# Patient Record
Sex: Female | Born: 1960 | Race: White | Hispanic: No | Marital: Married | Smoking: Never smoker
Health system: Southern US, Community
[De-identification: ages and names within clinical notes are randomized; demographics above are authoritative.]

## PROBLEM LIST (undated history)

## (undated) ENCOUNTER — Emergency Department (HOSPITAL_COMMUNITY): Admission: EM | Payer: 59 | Source: Home / Self Care

## (undated) DIAGNOSIS — K2960 Other gastritis without bleeding: Secondary | ICD-10-CM

## (undated) DIAGNOSIS — Z9889 Other specified postprocedural states: Secondary | ICD-10-CM

## (undated) DIAGNOSIS — D131 Benign neoplasm of stomach: Secondary | ICD-10-CM

## (undated) DIAGNOSIS — F4001 Agoraphobia with panic disorder: Secondary | ICD-10-CM

## (undated) DIAGNOSIS — F329 Major depressive disorder, single episode, unspecified: Secondary | ICD-10-CM

## (undated) DIAGNOSIS — D649 Anemia, unspecified: Secondary | ICD-10-CM

## (undated) DIAGNOSIS — E669 Obesity, unspecified: Secondary | ICD-10-CM

## (undated) DIAGNOSIS — G35 Multiple sclerosis: Secondary | ICD-10-CM

## (undated) DIAGNOSIS — M519 Unspecified thoracic, thoracolumbar and lumbosacral intervertebral disc disorder: Secondary | ICD-10-CM

## (undated) DIAGNOSIS — G4730 Sleep apnea, unspecified: Secondary | ICD-10-CM

## (undated) DIAGNOSIS — I10 Essential (primary) hypertension: Secondary | ICD-10-CM

## (undated) DIAGNOSIS — F32A Depression, unspecified: Secondary | ICD-10-CM

## (undated) DIAGNOSIS — E7800 Pure hypercholesterolemia, unspecified: Secondary | ICD-10-CM

## (undated) DIAGNOSIS — K219 Gastro-esophageal reflux disease without esophagitis: Secondary | ICD-10-CM

## (undated) DIAGNOSIS — K141 Geographic tongue: Secondary | ICD-10-CM

## (undated) HISTORY — PX: COLONOSCOPY: SHX174

## (undated) HISTORY — PX: NISSEN FUNDOPLICATION: SHX2091

---

## 2003-11-04 ENCOUNTER — Ambulatory Visit (HOSPITAL_COMMUNITY): Admission: RE | Admit: 2003-11-04 | Discharge: 2003-11-04 | Payer: Self-pay | Admitting: Family Medicine

## 2005-11-06 ENCOUNTER — Ambulatory Visit: Payer: Self-pay | Admitting: Gastroenterology

## 2005-11-14 ENCOUNTER — Other Ambulatory Visit: Payer: Self-pay

## 2005-11-20 ENCOUNTER — Ambulatory Visit: Payer: Self-pay | Admitting: Gastroenterology

## 2006-03-26 ENCOUNTER — Ambulatory Visit: Payer: Self-pay | Admitting: Family Medicine

## 2007-09-18 ENCOUNTER — Ambulatory Visit: Payer: Self-pay | Admitting: Family Medicine

## 2008-04-15 ENCOUNTER — Ambulatory Visit: Payer: Self-pay | Admitting: Family Medicine

## 2008-09-01 ENCOUNTER — Ambulatory Visit: Payer: Self-pay | Admitting: Gastroenterology

## 2008-09-01 LAB — HM COLONOSCOPY

## 2008-09-07 ENCOUNTER — Ambulatory Visit: Payer: Self-pay | Admitting: Gastroenterology

## 2008-09-30 ENCOUNTER — Emergency Department: Payer: Self-pay | Admitting: Internal Medicine

## 2008-10-01 ENCOUNTER — Ambulatory Visit: Payer: Self-pay | Admitting: Family Medicine

## 2008-11-22 ENCOUNTER — Ambulatory Visit: Payer: Self-pay | Admitting: Family Medicine

## 2010-02-08 ENCOUNTER — Ambulatory Visit: Payer: Self-pay | Admitting: Family Medicine

## 2010-04-20 ENCOUNTER — Ambulatory Visit: Payer: Self-pay | Admitting: Family Medicine

## 2010-12-04 ENCOUNTER — Ambulatory Visit: Payer: Self-pay | Admitting: General Surgery

## 2011-02-21 ENCOUNTER — Ambulatory Visit: Payer: Self-pay

## 2011-04-25 ENCOUNTER — Ambulatory Visit: Payer: Self-pay | Admitting: Family Medicine

## 2011-08-07 ENCOUNTER — Ambulatory Visit: Payer: Self-pay | Admitting: Gastroenterology

## 2011-08-09 LAB — PATHOLOGY REPORT

## 2012-05-20 ENCOUNTER — Ambulatory Visit: Payer: Self-pay | Admitting: Family Medicine

## 2012-07-17 ENCOUNTER — Ambulatory Visit: Payer: Self-pay | Admitting: Family Medicine

## 2012-11-25 ENCOUNTER — Ambulatory Visit: Payer: Self-pay | Admitting: Gastroenterology

## 2012-11-26 LAB — PATHOLOGY REPORT

## 2013-06-04 ENCOUNTER — Ambulatory Visit: Payer: Self-pay | Admitting: Family Medicine

## 2013-07-01 ENCOUNTER — Ambulatory Visit: Payer: Self-pay | Admitting: Family Medicine

## 2013-07-20 ENCOUNTER — Ambulatory Visit: Payer: Self-pay | Admitting: Family Medicine

## 2013-07-31 ENCOUNTER — Ambulatory Visit: Payer: Self-pay | Admitting: Family Medicine

## 2013-08-31 ENCOUNTER — Ambulatory Visit: Payer: Self-pay | Admitting: Family Medicine

## 2015-03-11 ENCOUNTER — Ambulatory Visit: Payer: Self-pay | Admitting: Family Medicine

## 2015-04-02 ENCOUNTER — Ambulatory Visit
Admit: 2015-04-02 | Disposition: A | Discharge status: Home / Self Care | Payer: Self-pay | Attending: Orthopedic Surgery | Admitting: Orthopedic Surgery

## 2015-04-22 ENCOUNTER — Ambulatory Visit
Admit: 2015-04-22 | Disposition: A | Discharge status: Home / Self Care | Payer: Self-pay | Attending: Family Medicine | Admitting: Family Medicine

## 2015-06-09 ENCOUNTER — Other Ambulatory Visit: Payer: Self-pay | Admitting: Family Medicine

## 2015-06-09 DIAGNOSIS — I10 Essential (primary) hypertension: Secondary | ICD-10-CM | POA: Insufficient documentation

## 2015-07-09 ENCOUNTER — Other Ambulatory Visit: Payer: Self-pay | Admitting: Family Medicine

## 2015-07-09 DIAGNOSIS — E039 Hypothyroidism, unspecified: Secondary | ICD-10-CM

## 2015-07-09 DIAGNOSIS — I10 Essential (primary) hypertension: Secondary | ICD-10-CM

## 2015-07-11 DIAGNOSIS — E039 Hypothyroidism, unspecified: Secondary | ICD-10-CM | POA: Insufficient documentation

## 2015-09-28 ENCOUNTER — Ambulatory Visit (INDEPENDENT_AMBULATORY_CARE_PROVIDER_SITE_OTHER): Payer: 59 | Admitting: Physician Assistant

## 2015-09-28 ENCOUNTER — Encounter: Payer: Self-pay | Admitting: Physician Assistant

## 2015-09-28 VITALS — BP 140/82 | HR 75 | Temp 98.1°F | Resp 16 | Wt 305.4 lb

## 2015-09-28 DIAGNOSIS — N3000 Acute cystitis without hematuria: Secondary | ICD-10-CM

## 2015-09-28 DIAGNOSIS — E7800 Pure hypercholesterolemia, unspecified: Secondary | ICD-10-CM

## 2015-09-28 DIAGNOSIS — E559 Vitamin D deficiency, unspecified: Secondary | ICD-10-CM

## 2015-09-28 DIAGNOSIS — R739 Hyperglycemia, unspecified: Secondary | ICD-10-CM | POA: Insufficient documentation

## 2015-09-28 DIAGNOSIS — F410 Panic disorder [episodic paroxysmal anxiety] without agoraphobia: Secondary | ICD-10-CM | POA: Insufficient documentation

## 2015-09-28 DIAGNOSIS — I10 Essential (primary) hypertension: Secondary | ICD-10-CM | POA: Insufficient documentation

## 2015-09-28 DIAGNOSIS — K2270 Barrett's esophagus without dysplasia: Secondary | ICD-10-CM | POA: Diagnosis not present

## 2015-09-28 DIAGNOSIS — M519 Unspecified thoracic, thoracolumbar and lumbosacral intervertebral disc disorder: Secondary | ICD-10-CM | POA: Insufficient documentation

## 2015-09-28 DIAGNOSIS — D509 Iron deficiency anemia, unspecified: Secondary | ICD-10-CM

## 2015-09-28 DIAGNOSIS — R7303 Prediabetes: Secondary | ICD-10-CM

## 2015-09-28 DIAGNOSIS — R232 Flushing: Secondary | ICD-10-CM

## 2015-09-28 DIAGNOSIS — N951 Menopausal and female climacteric states: Secondary | ICD-10-CM

## 2015-09-28 DIAGNOSIS — K219 Gastro-esophageal reflux disease without esophagitis: Secondary | ICD-10-CM | POA: Insufficient documentation

## 2015-09-28 DIAGNOSIS — F4001 Agoraphobia with panic disorder: Secondary | ICD-10-CM | POA: Insufficient documentation

## 2015-09-28 DIAGNOSIS — E669 Obesity, unspecified: Secondary | ICD-10-CM | POA: Insufficient documentation

## 2015-09-28 DIAGNOSIS — K141 Geographic tongue: Secondary | ICD-10-CM | POA: Insufficient documentation

## 2015-09-28 DIAGNOSIS — B379 Candidiasis, unspecified: Secondary | ICD-10-CM | POA: Diagnosis not present

## 2015-09-28 DIAGNOSIS — G4730 Sleep apnea, unspecified: Secondary | ICD-10-CM | POA: Insufficient documentation

## 2015-09-28 DIAGNOSIS — E039 Hypothyroidism, unspecified: Secondary | ICD-10-CM

## 2015-09-28 DIAGNOSIS — T3695XA Adverse effect of unspecified systemic antibiotic, initial encounter: Secondary | ICD-10-CM

## 2015-09-28 DIAGNOSIS — M5126 Other intervertebral disc displacement, lumbar region: Secondary | ICD-10-CM | POA: Insufficient documentation

## 2015-09-28 DIAGNOSIS — F329 Major depressive disorder, single episode, unspecified: Secondary | ICD-10-CM | POA: Insufficient documentation

## 2015-09-28 DIAGNOSIS — F32A Depression, unspecified: Secondary | ICD-10-CM | POA: Insufficient documentation

## 2015-09-28 DIAGNOSIS — R7309 Other abnormal glucose: Secondary | ICD-10-CM

## 2015-09-28 DIAGNOSIS — G4700 Insomnia, unspecified: Secondary | ICD-10-CM | POA: Insufficient documentation

## 2015-09-28 DIAGNOSIS — G2581 Restless legs syndrome: Secondary | ICD-10-CM | POA: Insufficient documentation

## 2015-09-28 DIAGNOSIS — R350 Frequency of micturition: Secondary | ICD-10-CM | POA: Diagnosis not present

## 2015-09-28 DIAGNOSIS — G4733 Obstructive sleep apnea (adult) (pediatric): Secondary | ICD-10-CM | POA: Insufficient documentation

## 2015-09-28 LAB — POCT URINALYSIS DIPSTICK
Bilirubin, UA: NEGATIVE
Blood, UA: NEGATIVE
Glucose, UA: NEGATIVE
Ketones, UA: NEGATIVE
Nitrite, UA: POSITIVE
Spec Grav, UA: 1.02
Urobilinogen, UA: 0.2
pH, UA: 6.5

## 2015-09-28 MED ORDER — FLUCONAZOLE 150 MG PO TABS: ORAL_TABLET | ORAL | Status: DC

## 2015-09-28 MED ORDER — NITROFURANTOIN MONOHYD MACRO 100 MG PO CAPS: 100.0000 mg | ORAL_CAPSULE | Freq: Two times a day (BID) | ORAL | Status: DC

## 2015-09-28 NOTE — Patient Instructions (Signed)

## 2015-09-28 NOTE — Progress Notes (Signed)
Patient ID: Patricia Deleon, female   DOB: 11-21-1961, 54 y.o.   MRN: [id number] Name: Patricia Deleon   MRN: [id number]    DOB: 24-Sep-1961   Date:09/28/2015       Progress Note  Subjective  Chief Complaint  Chief Complaint  Patient presents with  . Urinary Frequency    X 1 week  . Over Active Bladder    X 1 year, bladder leakage    Urinary Frequency  This is a new problem. The current episode started in the past 7 days. The problem occurs every urination. The problem has been gradually worsening. Associated symptoms include frequency and urgency. She has tried nothing for the symptoms.    she also states that she has had stress incontinence and urinary leakage for over one year now. She does wear poise pads. She does question if having the constant moisture may have caused her UTI. She is interested in possible treatment for this but would like to treat the UTI first.   She also is requesting labs for some of her chronic illnesses for the results to be discussed at her follow-up appointment. She states that she has not had labs in a long time and is curious about her iron level, vitamin D level, thyroid level and cholesterol. She also has Barrett's esophagus and is requesting a referral to Dr. Gustavo Lah, GI, whom she has seen in the past.   She also is reporting an increase in occurrence of hot flashes. She feels this may be menopause on setting. She does state she still has a regular menstrual cycle. No problem-specific assessment & plan notes found for this encounter.   History reviewed. No pertinent past medical history.  Social History  Substance Use Topics  . Smoking status: Never Smoker   . Smokeless tobacco: Never Used  . Alcohol Use: No     Current outpatient prescriptions:  .  CARTIA XT 180 MG 24 hr capsule, TAKE TWO CAPSULES BY MOUTH ONCE DAILY, Disp: 60 capsule, Rfl: 5 .  Cholecalciferol (VITAMIN D3) 2000 UNITS capsule, Take by mouth., Disp: , Rfl:  .   dexlansoprazole (DEXILANT) 60 MG capsule, Take by mouth., Disp: , Rfl:  .  DULoxetine (CYMBALTA) 30 MG capsule, Take 90 mg by mouth. , Disp: , Rfl:  .  FERROUS FUMARATE-VITAMIN C PO, Take by mouth., Disp: , Rfl:  .  Glucosamine Sulfate-MSM (MSM/GLUCOSAMINE) 250-250 MG CAPS, Take by mouth., Disp: , Rfl:  .  hydrochlorothiazide (MICROZIDE) 12.5 MG capsule, TAKE ONE CAPSULE BY MOUTH ONCE DAILY, Disp: 30 capsule, Rfl: 5 .  lansoprazole (PREVACID) 30 MG capsule, Take by mouth., Disp: , Rfl:  .  SYNTHROID 175 MCG tablet, TAKE ONE TABLET BY MOUTH ONCE DAILY, Disp: 30 tablet, Rfl: 5 .  temazepam (RESTORIL) 30 MG capsule, Take by mouth., Disp: , Rfl:   No Known Allergies  Review of Systems  Constitutional: Positive for diaphoresis.  HENT: Negative.   Eyes: Negative.   Respiratory: Negative.   Cardiovascular: Negative.   Gastrointestinal: Negative.   Genitourinary: Positive for urgency and frequency.  Musculoskeletal: Negative.   Skin: Negative.   Endo/Heme/Allergies: Negative.   Psychiatric/Behavioral: Negative.    Objective  Filed Vitals:   09/28/15 1319  BP: 140/82  Pulse: 75  Temp: 98.1 F (36.7 C)  TempSrc: Oral  Resp: 16  Weight: 305 lb 6.4 oz (138.529 kg)  SpO2: 97%     Physical Exam  Constitutional: She is well-developed, well-nourished, and in no distress. No distress.  Cardiovascular: Normal rate, regular rhythm and normal heart sounds.  Exam reveals no gallop and no friction rub.   No murmur heard. Pulmonary/Chest: Effort normal and breath sounds normal. No respiratory distress. She has no wheezes. She has no rales.  Abdominal: Soft. Bowel sounds are normal. She exhibits no distension and no mass. There is tenderness in the suprapubic area. There is no rebound, no guarding and no CVA tenderness.  Skin: She is not diaphoretic.  Vitals reviewed.  No results found for this or any previous visit (from the past 2160 hour(s)).   Assessment & Plan  1. Acute cystitis  without hematuria  I will send her urine for culture. I will treat the UTI with Macrobid as below. Will change antibiotic if needed per C&S results. I will see her back in approximately 2 weeks to discuss further lab work and her overactive bladder. - nitrofurantoin, macrocrystal-monohydrate, (MACROBID) 100 MG capsule; Take 1 capsule (100 mg total) by mouth 2 (two) times daily.  Dispense: 20 capsule; Refill: 0  2. Frequent urination See above medical treatment plan for acute cystitis. - POCT urinalysis dipstick - CBC with Differential - Urine culture  3. Essential hypertension Blood pressure today was decent. I will check her CMP and follow-up in 2 weeks to discuss the results. - Comprehensive Metabolic Panel (CMET)  4. Borderline diabetes She has previously been noted to have an elevated hemoglobin A1c. This has not been checked in a long time and she is curious as to what her level is now. She states that she had gotten better when she was losing weight. She states she lost almost 90 pounds but has since gained the majority of that back and is curious as to what her level is now. I will check her hemoglobin A1c level and follow-up in 2 weeks to discuss the results. - HgB A1c  5. Hypothyroidism, unspecified hypothyroidism type Previously controlled on Synthroid 175 g. I will check her thyroid level to make sure this dose is appropriate. Follow-up in 2 weeks to discuss lab results. - TSH  6. Avitaminosis D She does have a history of this and is currently on a vitamin D supplement. She has not had her levels checked in a long time and is curious if the vitamin D supplement is still needed. I will check her vitamin D level and follow-up in 2 weeks to discuss the lab results. - Vitamin D (25 hydroxy)  7. Anemia, iron deficiency She does have a history of this and has been on an iron supplement for a long time. She is curious as to what her level is because she would like to discontinue the  iron. She states it does cause her GI upset and aggravates her acid reflux. I will check her iron level and CBC to see if she may discontinue the iron supplement. I will see her back in 2 weeks to discuss the lab results. - Iron  8. Hypercholesteremia She did have a history of high cholesterol prior to her weight loss. She states that since she has gained the majority of her weight back that she had loss she would like to have her cholesterol level checked again because she is scared that it might be high. Prior to her weight loss she had been on a cholesterol-lowering medication. I will check her lipid panel and follow-up in 2 weeks to discuss the results. - Lipid panel  9. Barrett's esophagus She does have a known history of this and was previously  seen by Dr. Gustavo Lah. She states that she has not had an EGD in a while and would like a new referral back to his office for consideration of a repeat EGD to evaluate the Barrett's esophagus. - Ambulatory referral to Gastroenterology  10. Hot flashes This is a new onset over the last couple of months. She states that she feels it may be secondary due to and a pause. I will check her labs to see if she is starting to become in a perimenopausal state. She does still continue her menstrual cycle and states it is regular. I will follow-up with her in 2 weeks to discuss these results. - FSH/LH  11. Antibiotic-induced yeast infection She was started on Macrobid for UTI. She states that she frequently gets yeast infections when she is put on an antibiotic. I will go ahead and send and Diflucan as below for antibiotic-induced she states infection. - fluconazole (DIFLUCAN) 150 MG tablet; 1 tab PO once daily, may repeat in 3 days if needed.  Dispense: 2 tablet; Refill: 0

## 2015-09-29 ENCOUNTER — Telehealth: Payer: Self-pay

## 2015-09-29 LAB — CBC WITH DIFFERENTIAL/PLATELET
Basophils Absolute: 0 10*3/uL (ref 0.0–0.2)
Basos: 0 %
EOS (ABSOLUTE): 0.1 10*3/uL (ref 0.0–0.4)
Eos: 1 %
Hematocrit: 45.3 % (ref 34.0–46.6)
Hemoglobin: 15.5 g/dL (ref 11.1–15.9)
Immature Grans (Abs): 0 10*3/uL (ref 0.0–0.1)
Immature Granulocytes: 0 %
Lymphocytes Absolute: 2.3 10*3/uL (ref 0.7–3.1)
Lymphs: 32 %
MCH: 31.1 pg (ref 26.6–33.0)
MCHC: 34.2 g/dL (ref 31.5–35.7)
MCV: 91 fL (ref 79–97)
Monocytes Absolute: 0.3 10*3/uL (ref 0.1–0.9)
Monocytes: 5 %
Neutrophils Absolute: 4.6 10*3/uL (ref 1.4–7.0)
Neutrophils: 62 %
Platelets: 271 10*3/uL (ref 150–379)
RBC: 4.98 x10E6/uL (ref 3.77–5.28)
RDW: 13.7 % (ref 12.3–15.4)
WBC: 7.4 10*3/uL (ref 3.4–10.8)

## 2015-09-29 LAB — COMPREHENSIVE METABOLIC PANEL
ALT: 17 IU/L (ref 0–32)
AST: 19 IU/L (ref 0–40)
Albumin/Globulin Ratio: 1.7 (ref 1.1–2.5)
Albumin: 4.6 g/dL (ref 3.5–5.5)
Alkaline Phosphatase: 89 IU/L (ref 39–117)
BUN/Creatinine Ratio: 12 (ref 9–23)
BUN: 10 mg/dL (ref 6–24)
Bilirubin Total: 0.5 mg/dL (ref 0.0–1.2)
CO2: 23 mmol/L (ref 18–29)
Calcium: 9.4 mg/dL (ref 8.7–10.2)
Chloride: 97 mmol/L (ref 97–108)
Creatinine, Ser: 0.85 mg/dL (ref 0.57–1.00)
GFR calc Af Amer: 91 mL/min/{1.73_m2} (ref >59)
GFR calc non Af Amer: 79 mL/min/{1.73_m2} (ref >59)
Globulin, Total: 2.7 g/dL (ref 1.5–4.5)
Glucose: 91 mg/dL (ref 65–99)
Potassium: 4.1 mmol/L (ref 3.5–5.2)
Sodium: 136 mmol/L (ref 134–144)
Total Protein: 7.3 g/dL (ref 6.0–8.5)

## 2015-09-29 LAB — LIPID PANEL
Chol/HDL Ratio: 5.5 ratio units — ABNORMAL HIGH (ref 0.0–4.4)
Cholesterol, Total: 226 mg/dL — ABNORMAL HIGH (ref 100–199)
HDL: 41 mg/dL (ref >39)
LDL Calculated: 143 mg/dL — ABNORMAL HIGH (ref 0–99)
Triglycerides: 209 mg/dL — ABNORMAL HIGH (ref 0–149)
VLDL Cholesterol Cal: 42 mg/dL — ABNORMAL HIGH (ref 5–40)

## 2015-09-29 LAB — FSH/LH
FSH: 3 m[IU]/mL
LH: 5.9 m[IU]/mL

## 2015-09-29 LAB — HEMOGLOBIN A1C
Est. average glucose Bld gHb Est-mCnc: 120 mg/dL
Hgb A1c MFr Bld: 5.8 % — ABNORMAL HIGH (ref 4.8–5.6)

## 2015-09-29 LAB — VITAMIN D 25 HYDROXY (VIT D DEFICIENCY, FRACTURES): Vit D, 25-Hydroxy: 30.1 ng/mL (ref 30.0–100.0)

## 2015-09-29 LAB — TSH: TSH: 0.285 u[IU]/mL — ABNORMAL LOW (ref 0.450–4.500)

## 2015-09-29 LAB — IRON: Iron: 92 ug/dL (ref 27–159)

## 2015-09-29 NOTE — Telephone Encounter (Signed)
LMTCB  Thanks,  -Joseline 

## 2015-09-29 NOTE — Telephone Encounter (Signed)
-----   Message from Mar Daring, [place] sent at 09/29/2015  2:14 PM EDT ----- Blood count is WNL, no anemia currently.  TSH is slightly low so may need to decrease synthroid dose slightly and recheck.  We will discuss this when you return in 2 weeks.  Iron is stable.  May discontinue iron supplement.  We will recheck when we check TSH to make sure it does not drop again.  Vit D level is stable and WNL.  Kidney and liver function as well as other electrolytes are all WNL.  HgB A1c is slightly elevated still at 5.8 indicating pre-diabetic.  Cholesterol and LDL are slightly elevated as well.  When you return in 2 weeks we will discuss if we should restart cholesterol lowering medication at this time or focus on lifestyle changes and add fish oil.  FSH and LH do not indicate peri-menopausal or post menopausal yet.  We will discuss in more detail in 2 weeks when you return.  Urin culture has not resulted yet.

## 2015-09-30 ENCOUNTER — Telehealth: Payer: Self-pay

## 2015-09-30 LAB — URINE CULTURE

## 2015-09-30 NOTE — Telephone Encounter (Signed)
-----   Message from Mar Daring, PA-C sent at 09/30/2015 11:44 AM EDT ----- Culture was positive for e.coli.  It is susceptible to antibiotic prescribed.  Continue until completed.

## 2015-09-30 NOTE — Telephone Encounter (Signed)
Patient advised as directed below.  Thanks,  -Joseline 

## 2015-09-30 NOTE — Telephone Encounter (Signed)
LM regarding the results. Per patient as we spoke earlier it was ok to leave results on voicemail. If any questions or concerns advised to return call.  Thanks,  -Alexei Ey

## 2015-10-12 ENCOUNTER — Ambulatory Visit (INDEPENDENT_AMBULATORY_CARE_PROVIDER_SITE_OTHER): Payer: 59 | Admitting: Physician Assistant

## 2015-10-12 ENCOUNTER — Encounter: Payer: Self-pay | Admitting: Physician Assistant

## 2015-10-12 VITALS — BP 138/78 | HR 92 | Temp 98.9°F | Resp 16 | Ht 67.0 in | Wt 307.2 lb

## 2015-10-12 DIAGNOSIS — Z23 Encounter for immunization: Secondary | ICD-10-CM | POA: Diagnosis not present

## 2015-10-12 DIAGNOSIS — E039 Hypothyroidism, unspecified: Secondary | ICD-10-CM

## 2015-10-12 DIAGNOSIS — I10 Essential (primary) hypertension: Secondary | ICD-10-CM | POA: Diagnosis not present

## 2015-10-12 DIAGNOSIS — Z6841 Body Mass Index (BMI) 40.0 and over, adult: Secondary | ICD-10-CM | POA: Diagnosis not present

## 2015-10-12 DIAGNOSIS — R7303 Prediabetes: Secondary | ICD-10-CM

## 2015-10-12 MED ORDER — PHENTERMINE HCL 15 MG PO CAPS: 15.0000 mg | ORAL_CAPSULE | ORAL | Status: DC

## 2015-10-12 MED ORDER — LEVOTHYROXINE SODIUM 150 MCG PO TABS: 150.0000 ug | ORAL_TABLET | Freq: Every day | ORAL | Status: DC

## 2015-10-12 NOTE — Patient Instructions (Addendum)
Calorie Counting for Weight Loss Calories are energy you get from the things you eat and drink. Your body uses this energy to keep you going throughout the day. The number of calories you eat affects your weight. When you eat more calories than your body needs, your body stores the extra calories as fat. When you eat fewer calories than your body needs, your body burns fat to get the energy it needs. Calorie counting means keeping track of how many calories you eat and drink each day. If you make sure to eat fewer calories than your body needs, you should lose weight. In order for calorie counting to work, you will need to eat the number of calories that are right for you in a day to lose a healthy amount of weight per week. A healthy amount of weight to lose per week is usually 1-2 lb (0.5-0.9 kg). A dietitian can determine how many calories you need in a day and give you suggestions on how to reach your calorie goal.  WHAT IS MY MY PLAN? My goal is to have 1200-1500 calories per day.  If I have this many calories per day, I should lose around 1-2 pounds per week. WHAT DO I NEED TO KNOW ABOUT CALORIE COUNTING? In order to meet your daily calorie goal, you will need to:  Find out how many calories are in each food you would like to eat. Try to do this before you eat.  Decide how much of the food you can eat.  Write down what you ate and how many calories it had. Doing this is called keeping a food log. WHERE DO I FIND CALORIE INFORMATION? The number of calories in a food can be found on a Nutrition Facts label. Note that all the information on a label is based on a specific serving of the food. If a food does not have a Nutrition Facts label, try to look up the calories online or ask your dietitian for help. HOW DO I DECIDE HOW MUCH TO EAT? To decide how much of the food you can eat, you will need to consider both the number of calories in one serving and the size of one serving. This information  can be found on the Nutrition Facts label. If a food does not have a Nutrition Facts label, look up the information online or ask your dietitian for help. Remember that calories are listed per serving. If you choose to have more than one serving of a food, you will have to multiply the calories per serving by the amount of servings you plan to eat. For example, the label on a package of bread might say that a serving size is 1 slice and that there are 90 calories in a serving. If you eat 1 slice, you will have eaten 90 calories. If you eat 2 slices, you will have eaten 180 calories. HOW DO I KEEP A FOOD LOG? After each meal, record the following information in your food log:  What you ate.  How much of it you ate.  How many calories it had.  Then, add up your calories. Keep your food log near you, such as in a small notebook in your pocket. Another option is to use a mobile app or website. Some programs will calculate calories for you and show you how many calories you have left each time you add an item to the log. WHAT ARE SOME CALORIE COUNTING TIPS?  Use your calories on foods  and drinks that will fill you up and not leave you hungry. Some examples of this include foods like nuts and nut butters, vegetables, lean proteins, and high-fiber foods (more than 5 g fiber per serving).  Eat nutritious foods and avoid empty calories. Empty calories are calories you get from foods or beverages that do not have many nutrients, such as candy and soda. It is better to have a nutritious high-calorie food (such as an avocado) than a food with few nutrients (such as a bag of chips).  Know how many calories are in the foods you eat most often. This way, you do not have to look up how many calories they have each time you eat them.  Look out for foods that may seem like low-calorie foods but are really high-calorie foods, such as baked goods, soda, and fat-free candy.  Pay attention to calories in drinks.  Drinks such as sodas, specialty coffee drinks, alcohol, and juices have a lot of calories yet do not fill you up. Choose low-calorie drinks like water and diet drinks.  Focus your calorie counting efforts on higher calorie items. Logging the calories in a garden salad that contains only vegetables is less important than calculating the calories in a milk shake.  Find a way of tracking calories that works for you. Get creative. Most people who are successful find ways to keep track of how much they eat in a day, even if they do not count every calorie. WHAT ARE SOME PORTION CONTROL TIPS?  Know how many calories are in a serving. This will help you know how many servings of a certain food you can have.  Use a measuring cup to measure serving sizes. This is helpful when you start out. With time, you will be able to estimate serving sizes for some foods.  Take some time to put servings of different foods on your favorite plates, bowls, and cups so you know what a serving looks like.  Try not to eat straight from a bag or box. Doing this can lead to overeating. Put the amount you would like to eat in a cup or on a plate to make sure you are eating the right portion.  Use smaller plates, glasses, and bowls to prevent overeating. This is a quick and easy way to practice portion control. If your plate is smaller, less food can fit on it.  Try not to multitask while eating, such as watching TV or using your computer. If it is time to eat, sit down at a table and enjoy your food. Doing this will help you to start recognizing when you are full. It will also make you more aware of what and how much you are eating. HOW CAN I CALORIE COUNT WHEN EATING OUT?  Ask for smaller portion sizes or child-sized portions.  Consider sharing an entree and sides instead of getting your own entree.  If you get your own entree, eat only half. Ask for a box at the beginning of your meal and put the rest of your entree in  it so you are not tempted to eat it.  Look for the calories on the menu. If calories are listed, choose the lower calorie options.  Choose dishes that include vegetables, fruits, whole grains, low-fat dairy products, and lean protein. Focusing on smart food choices from each of the 5 food groups can help you stay on track at restaurants.  Choose items that are boiled, broiled, grilled, or steamed.  Choose  water, milk, unsweetened iced tea, or other drinks without added sugars. If you want an alcoholic beverage, choose a lower calorie option. For example, a regular margarita can have up to 700 calories and a glass of wine has around 150.  Stay away from items that are buttered, battered, fried, or served with cream sauce. Items labeled "crispy" are usually fried, unless stated otherwise.  Ask for dressings, sauces, and syrups on the side. These are usually very high in calories, so do not eat much of them.  Watch out for salads. Many people think salads are a healthy option, but this is often not the case. Many salads come with bacon, fried chicken, lots of cheese, fried chips, and dressing. All of these items have a lot of calories. If you want a salad, choose a garden salad and ask for grilled meats or steak. Ask for the dressing on the side, or ask for olive oil and vinegar or lemon to use as dressing.  Estimate how many servings of a food you are given. For example, a serving of cooked rice is  cup or about the size of half a tennis ball or one cupcake wrapper. Knowing serving sizes will help you be aware of how much food you are eating at restaurants. The list below tells you how big or small some common portion sizes are based on everyday objects.  1 oz--4 stacked dice.  3 oz--1 deck of cards.  1 tsp--1 dice.  1 Tbsp-- a Ping-Pong ball.  2 Tbsp--1 Ping-Pong ball.   cup--1 tennis ball or 1 cupcake wrapper.  1 cup--1 baseball.   This information is not intended to replace advice  given to you by your health care provider. Make sure you discuss any questions you have with your health care provider.   Document Released: 12/17/2005 Document Revised: 01/07/2015 Document Reviewed: 10/22/2013 Elsevier Interactive Patient Education 2016 [employer]. Exercising to [employer] Exercising can help you to lose weight. In order to lose weight through exercise, you need to do vigorous-intensity exercise. You can tell that you are exercising with vigorous intensity if you are breathing very hard and fast and cannot hold a conversation while exercising. Moderate-intensity exercise helps to maintain your current weight. You can tell that you are exercising at a moderate level if you have a higher heart rate and faster breathing, but you are still able to hold a conversation. HOW OFTEN SHOULD I EXERCISE? Choose an activity that you enjoy and set realistic goals. Your health care provider can help you to make an activity plan that works for you. Exercise regularly as directed by your health care provider. This may include:  Doing resistance training twice each week, such as:  Push-ups.  Sit-ups.  Lifting weights.  Using resistance bands.  Doing a given intensity of exercise for a given amount of time. Choose from these options:  150 minutes of moderate-intensity exercise every week.  75 minutes of vigorous-intensity exercise every week.  A mix of moderate-intensity and vigorous-intensity exercise every week. Children, pregnant women, people who are out of shape, people who are overweight, and older adults may need to consult a health care provider for individual recommendations. If you have any sort of medical condition, be sure to consult your health care provider before starting a new exercise program. WHAT ARE SOME ACTIVITIES THAT CAN HELP ME TO LOSE WEIGHT?   Walking at a rate of at least 4.5 miles an hour.  Jogging or running at a rate of  5 miles per hour.  Biking at a  rate of at least 10 miles per hour.  Lap swimming.  Roller-skating or in-line skating.  Cross-country skiing.  Vigorous competitive sports, such as football, basketball, and soccer.  Jumping rope.  Aerobic dancing. HOW CAN I BE MORE ACTIVE IN MY DAY-TO-DAY ACTIVITIES?  Use the stairs instead of the elevator.  Take a walk during your lunch break.  If you drive, park your car farther away from work or school.  If you take public transportation, get off one stop early and walk the rest of the way.  Make all of your phone calls while standing up and walking around.  Get up, stretch, and walk around every 30 minutes throughout the day. WHAT GUIDELINES SHOULD I FOLLOW WHILE EXERCISING?  Do not exercise so much that you hurt yourself, feel dizzy, or get very short of breath.  Consult your health care provider prior to starting a new exercise program.  Wear comfortable clothes and shoes with good support.  Drink plenty of water while you exercise to prevent dehydration or heat stroke. Body water is lost during exercise and must be replaced.  Work out until you breathe faster and your heart beats faster.   This information is not intended to replace advice given to you by your health care provider. Make sure you discuss any questions you have with your health care provider.   Document Released: 01/19/2011 Document Revised: 01/07/2015 Document Reviewed: 05/20/2014 Elsevier Interactive Patient Education [employer].

## 2015-10-12 NOTE — Progress Notes (Signed)
Patient: Patricia Deleon Female    DOB: December 30, 1961   54 y.o.   MRN: [id number] Visit Date: 10/12/2015  Today's Provider: Mar Daring, PA-C   Chief Complaint  Patient presents with  . Follow-up    Acute Cystitis, Hypertension,Diabetes,Labs   Subjective:    HPI Patricia Deleon 54 year old is here following up on  Labs, acute cystitis, Essential Hypertension, Last OV blood pressure was 140/82.  Today her BP is 138/78. She has borderline diabetes as well with Hgb A1C 5.8 on 09/30/15. Patient wants to talk to provider about weight and adding Metformin for sugar and weight control.  Also she states that the symptoms of the UTI she previously had have completely cleared.     No Known Allergies Previous Medications   CARTIA XT 180 MG 24 HR CAPSULE    TAKE TWO CAPSULES BY MOUTH ONCE DAILY   CHOLECALCIFEROL (VITAMIN D3) 2000 UNITS CAPSULE    Take by mouth.   DEXLANSOPRAZOLE (DEXILANT) 60 MG CAPSULE    Take by mouth.   DULOXETINE (CYMBALTA) 30 MG CAPSULE    Take 90 mg by mouth.    FERROUS FUMARATE-VITAMIN C PO    Take by mouth.   FLUCONAZOLE (DIFLUCAN) 150 MG TABLET    1 tab PO once daily, may repeat in 3 days if needed.   GLUCOSAMINE SULFATE-MSM (MSM/GLUCOSAMINE) 250-250 MG CAPS    Take by mouth.   HYDROCHLOROTHIAZIDE (MICROZIDE) 12.5 MG CAPSULE    TAKE ONE CAPSULE BY MOUTH ONCE DAILY   LANSOPRAZOLE (PREVACID) 30 MG CAPSULE    Take by mouth.   MEGARED OMEGA-3 KRILL OIL PO    Take by mouth daily.   NITROFURANTOIN, MACROCRYSTAL-MONOHYDRATE, (MACROBID) 100 MG CAPSULE    Take 1 capsule (100 mg total) by mouth 2 (two) times daily.   SYNTHROID 175 MCG TABLET    TAKE ONE TABLET BY MOUTH ONCE DAILY   TEMAZEPAM (RESTORIL) 30 MG CAPSULE    Take by mouth.    Review of Systems  Constitutional: Positive for fatigue and unexpected weight change (weight gain).  HENT: Negative for congestion, postnasal drip, rhinorrhea, sinus pressure, sneezing and sore throat.   Respiratory:  Negative for chest tightness and wheezing.   Cardiovascular: Negative for chest pain and palpitations.  Gastrointestinal: Negative.   Endocrine: Negative.   Genitourinary: Positive for urgency and frequency.  Musculoskeletal: Positive for arthralgias (right hip).  Neurological: Negative.     Social History  Substance Use Topics  . Smoking status: Never Smoker   . Smokeless tobacco: Never Used  . Alcohol Use: No   Objective:   BP 138/78 mmHg  Pulse 92  Temp(Src) 98.9 F (37.2 C) (Oral)  Resp 16  Ht 5\' 7"  (1.702 m)  Wt 307 lb 3.2 oz (139.345 kg)  BMI 48.10 kg/m2  LMP 10/07/2015  Physical Exam  Constitutional: She appears well-developed and well-nourished. No distress.  Neck: Normal range of motion. Neck supple. No JVD present. No tracheal deviation present. No thyromegaly present.  Cardiovascular: Normal rate, regular rhythm and normal heart sounds.  Exam reveals no gallop and no friction rub.   No murmur heard. Pulmonary/Chest: Effort normal and breath sounds normal. No respiratory distress. She has no wheezes. She has no rales.  Lymphadenopathy:    She has no cervical adenopathy.  Skin: She is not diaphoretic.  Vitals reviewed.       Assessment & Plan:     1. Need for influenza vaccination Flu  vaccine given today without complication. - Flu Vaccine QUAD 36+ mos IM  2. Hypothyroidism, unspecified hypothyroidism type Labs on 09/30/2015 showed a low TSH at 0.23. I will decrease her levothyroxine dose from 175 g to 150 g as below. She voiced understanding with the change in medication. We'll follow-up in 3 months to recheck TSH. - levothyroxine (SYNTHROID) 150 MCG tablet; Take 1 tablet (150 mcg total) by mouth daily before breakfast.  Dispense: 30 tablet; Refill: 1  3. Morbid obesity due to excess calories [facility]) She states that she has previously lost weight but when her husband lost his job they quit eating healthy and she started gaining more weight. She states at  that time she also quit doing her physical activity. She used to do exercise tapes at home. She states that there was a time where she was doing an hour of cardio 5 days a week. She is going to try to start this routine back up as well as her healthy eating now that she is more stable and has a job herself. She would like to try phentermine to help with appetite suppression during this time. I will follow-up with her in 4 weeks to evaluate how she is doing on her weight loss progress. - phentermine 15 MG capsule; Take 1 capsule (15 mg total) by mouth every morning.  Dispense: 30 capsule; Refill: 0  4. BMI 45.0-49.9, adult [facility]) See above medical treatment plan for morbid obesity. Phentermine was prescribed as below for appetite suppression. I will follow-up with her in 4 weeks to see how she is doing with her weight loss progress. We also discussed possibly increasing the phentermine to 37.5 mg if she tolerates the 15 mg well. - phentermine 15 MG capsule; Take 1 capsule (15 mg total) by mouth every morning.  Dispense: 30 capsule; Refill: 0  5. Borderline diabetes Most recent hemoglobin A1c was 5.8. We discussed weight loss, diet, exercise and will add appetite suppressant as above in hopes that this will help with her blood sugar. We will recheck her hemoglobin A1c in 6 months to see how lifestyle changes are helping this level.  6. Essential hypertension Currently blood pressure is stable. She is currently taking Cartia extended release 180 mg and also taking hydrochlorothiazide 12.5 mg. I will allow her to start her exercise and weight loss program as she is planning to do and we will recheck her blood pressure in 4 weeks. If blood pressure is still elevated I will increase her hydrochlorothiazide to 25 mg.       Mar Daring, PA-C  [facility]

## 2015-11-11 ENCOUNTER — Ambulatory Visit: Payer: 59 | Admitting: Family Medicine

## 2015-11-13 ENCOUNTER — Other Ambulatory Visit: Payer: Self-pay | Admitting: Family Medicine

## 2015-11-13 DIAGNOSIS — K219 Gastro-esophageal reflux disease without esophagitis: Secondary | ICD-10-CM

## 2015-11-14 NOTE — Telephone Encounter (Signed)
Last ov 10/12/15

## 2015-12-14 ENCOUNTER — Other Ambulatory Visit: Payer: Self-pay | Admitting: Physician Assistant

## 2015-12-14 ENCOUNTER — Other Ambulatory Visit: Payer: Self-pay | Admitting: Family Medicine

## 2015-12-14 DIAGNOSIS — K219 Gastro-esophageal reflux disease without esophagitis: Secondary | ICD-10-CM

## 2015-12-14 DIAGNOSIS — I10 Essential (primary) hypertension: Secondary | ICD-10-CM

## 2016-01-30 ENCOUNTER — Encounter: Payer: Self-pay | Admitting: *Deleted

## 2016-01-31 ENCOUNTER — Ambulatory Visit: Payer: 59 | Admitting: Anesthesiology

## 2016-01-31 ENCOUNTER — Encounter
Admission: RE | Disposition: A | Discharge status: Home / Self Care | Payer: Self-pay | Source: Ambulatory Visit | Attending: Gastroenterology

## 2016-01-31 ENCOUNTER — Ambulatory Visit
Admission: RE | Admit: 2016-01-31 | Discharge: 2016-01-31 | Disposition: A | Discharge status: Home / Self Care | Payer: 59 | Source: Ambulatory Visit | Attending: Gastroenterology | Admitting: Gastroenterology

## 2016-01-31 DIAGNOSIS — K317 Polyp of stomach and duodenum: Secondary | ICD-10-CM | POA: Diagnosis not present

## 2016-01-31 DIAGNOSIS — R1013 Epigastric pain: Secondary | ICD-10-CM | POA: Diagnosis present

## 2016-01-31 DIAGNOSIS — K2950 Unspecified chronic gastritis without bleeding: Secondary | ICD-10-CM | POA: Insufficient documentation

## 2016-01-31 DIAGNOSIS — F4001 Agoraphobia with panic disorder: Secondary | ICD-10-CM | POA: Insufficient documentation

## 2016-01-31 DIAGNOSIS — K219 Gastro-esophageal reflux disease without esophagitis: Secondary | ICD-10-CM | POA: Diagnosis not present

## 2016-01-31 DIAGNOSIS — I10 Essential (primary) hypertension: Secondary | ICD-10-CM | POA: Diagnosis not present

## 2016-01-31 DIAGNOSIS — K2270 Barrett's esophagus without dysplasia: Secondary | ICD-10-CM | POA: Diagnosis not present

## 2016-01-31 DIAGNOSIS — E039 Hypothyroidism, unspecified: Secondary | ICD-10-CM | POA: Insufficient documentation

## 2016-01-31 DIAGNOSIS — E7800 Pure hypercholesterolemia, unspecified: Secondary | ICD-10-CM | POA: Insufficient documentation

## 2016-01-31 DIAGNOSIS — K449 Diaphragmatic hernia without obstruction or gangrene: Secondary | ICD-10-CM | POA: Insufficient documentation

## 2016-01-31 DIAGNOSIS — F329 Major depressive disorder, single episode, unspecified: Secondary | ICD-10-CM | POA: Insufficient documentation

## 2016-01-31 DIAGNOSIS — Z79899 Other long term (current) drug therapy: Secondary | ICD-10-CM | POA: Diagnosis not present

## 2016-01-31 DIAGNOSIS — Z6841 Body Mass Index (BMI) 40.0 and over, adult: Secondary | ICD-10-CM | POA: Insufficient documentation

## 2016-01-31 DIAGNOSIS — G4730 Sleep apnea, unspecified: Secondary | ICD-10-CM | POA: Insufficient documentation

## 2016-01-31 HISTORY — DX: Unspecified thoracic, thoracolumbar and lumbosacral intervertebral disc disorder: M51.9

## 2016-01-31 HISTORY — PX: ESOPHAGOGASTRODUODENOSCOPY (EGD) WITH PROPOFOL: SHX5813

## 2016-01-31 HISTORY — DX: Depression, unspecified: F32.A

## 2016-01-31 HISTORY — DX: Sleep apnea, unspecified: G47.30

## 2016-01-31 HISTORY — DX: Gastro-esophageal reflux disease without esophagitis: K21.9

## 2016-01-31 HISTORY — DX: Anemia, unspecified: D64.9

## 2016-01-31 HISTORY — DX: Obesity, unspecified: E66.9

## 2016-01-31 HISTORY — DX: Essential (primary) hypertension: I10

## 2016-01-31 HISTORY — DX: Agoraphobia with panic disorder: F40.01

## 2016-01-31 HISTORY — DX: Pure hypercholesterolemia, unspecified: E78.00

## 2016-01-31 HISTORY — DX: Other specified postprocedural states: Z98.890

## 2016-01-31 HISTORY — DX: Major depressive disorder, single episode, unspecified: F32.9

## 2016-01-31 HISTORY — DX: Geographic tongue: K14.1

## 2016-01-31 LAB — POCT PREGNANCY, URINE: PREG TEST UR: NEGATIVE

## 2016-01-31 LAB — SURGICAL PATHOLOGY

## 2016-01-31 SURGERY — ESOPHAGOGASTRODUODENOSCOPY (EGD) WITH PROPOFOL
Anesthesia: General

## 2016-01-31 MED ORDER — PROPOFOL 500 MG/50ML IV EMUL
INTRAVENOUS | Status: DC | PRN
  Administered 2016-01-31: 100 ug/kg/min via INTRAVENOUS

## 2016-01-31 MED ORDER — MIDAZOLAM HCL 2 MG/2ML IJ SOLN
INTRAMUSCULAR | Status: DC | PRN
Start: 2016-01-31 — End: 2016-01-31
  Administered 2016-01-31: 2 mg via INTRAVENOUS

## 2016-01-31 MED ORDER — SODIUM CHLORIDE 0.9 % IV SOLN
INTRAVENOUS | Status: DC
  Administered 2016-01-31: 1000 mL via INTRAVENOUS

## 2016-01-31 MED ORDER — SODIUM CHLORIDE 0.9 % IV SOLN: INTRAVENOUS | Status: DC

## 2016-01-31 MED ORDER — PROPOFOL 10 MG/ML IV BOLUS
INTRAVENOUS | Status: DC | PRN
  Administered 2016-01-31: 10 mg via INTRAVENOUS
  Administered 2016-01-31: 20 mg via INTRAVENOUS

## 2016-01-31 NOTE — Transfer of Care (Signed)
Immediate Anesthesia Transfer of Care Note  Patient: Patricia Deleon  Procedure(s) Performed: Procedure(s): ESOPHAGOGASTRODUODENOSCOPY (EGD) WITH PROPOFOL (N/A)  Patient Location: PACU and Endoscopy Unit  Anesthesia Type:General  Level of Consciousness: awake, alert  and oriented  Airway & Oxygen Therapy: Patient Spontanous Breathing and Patient connected to nasal cannula oxygen  Post-op Assessment: Report given to RN and Post -op Vital signs reviewed and stable  Post vital signs: Reviewed and stable  Last Vitals: 1301- 98.2temp 98% sat 79 hr 22resp 96/53 Filed Vitals:   01/31/16 1037  BP: 148/92  Pulse: 83  Temp: 37.4 C  Resp: 20    Complications: No apparent anesthesia complications

## 2016-01-31 NOTE — Anesthesia Postprocedure Evaluation (Signed)
Anesthesia Post Note  Patient: Patricia Deleon  Procedure(s) Performed: Procedure(s) (LRB): ESOPHAGOGASTRODUODENOSCOPY (EGD) WITH PROPOFOL (N/A)  Patient location during evaluation: PACU Anesthesia Type: General Level of consciousness: awake and alert Pain management: satisfactory to patient Respiratory status: respiratory function stable Cardiovascular status: stable Anesthetic complications: no    Last Vitals:  Filed Vitals:   01/31/16 1037  BP: 148/92  Pulse: 83  Temp: 37.4 C  Resp: 20    Last Pain: There were no vitals filed for this visit.               VAN STAVEREN,Edla Para

## 2016-01-31 NOTE — Anesthesia Preprocedure Evaluation (Signed)
Anesthesia Evaluation  Patient identified by MRN, date of birth, ID band Patient awake    Reviewed: Allergy & Precautions, NPO status , Patient's Chart, lab work & pertinent test results  Airway Mallampati: II       Dental  (+) Teeth Intact   Pulmonary sleep apnea and Continuous Positive Airway Pressure Ventilation ,     + decreased breath sounds      Cardiovascular Exercise Tolerance: Good hypertension, Pt. on medications  Rhythm:Regular     Neuro/Psych    GI/Hepatic Neg liver ROS, GERD  ,  Endo/Other  Hypothyroidism Morbid obesity  Renal/GU negative Renal ROS     Musculoskeletal   Abdominal (+) + obese,   Peds  Hematology  (+) anemia ,   Anesthesia Other Findings   Reproductive/Obstetrics                             Anesthesia Physical Anesthesia Plan  ASA: III  Anesthesia Plan: General   Post-op Pain Management:    Induction: Intravenous  Airway Management Planned: Natural Airway and Nasal Cannula  Additional Equipment:   Intra-op Plan:   Post-operative Plan:   Informed Consent: I have reviewed the patients History and Physical, chart, labs and discussed the procedure including the risks, benefits and alternatives for the proposed anesthesia with the patient or authorized representative who has indicated his/her understanding and acceptance.     Plan Discussed with: CRNA  Anesthesia Plan Comments:         Anesthesia Quick Evaluation

## 2016-01-31 NOTE — Anesthesia Procedure Notes (Signed)
Date/Time: 01/31/2016 12:26 PM Performed by: Johnna Acosta Pre-anesthesia Checklist: Patient identified, Emergency Drugs available, Suction available, Patient being monitored and Timeout performed Patient Re-evaluated:Patient Re-evaluated prior to inductionOxygen Delivery Method: Nasal cannula

## 2016-01-31 NOTE — H&P (Signed)
Outpatient short stay form Pre-procedure 01/31/2016 12:25 PM Lollie Sails MD  Primary Physician: Dr. Margarita Rana  Reason for visit:  EGD  History of present illness:  Patient is a 55 year old female presenting today for EGD in regards to her personal history of Barrett's esophagus as well as some recent dyspepsia. She was placed on some has helped with that symptom. She is also taking DEXALANT in regards her issues with Barrett's and reflux. Has been effective in controlling the reflux itself. She takes no blood thinners or aspirin products.    Current facility-administered medications:  .  0.9 %  sodium chloride infusion, , Intravenous, Continuous, Lollie Sails, MD, Last Rate: 20 mL/hr at 01/31/16 1051, 1,000 mL at 01/31/16 1051 .  0.9 %  sodium chloride infusion, , Intravenous, Continuous, Lollie Sails, MD  Facility-Administered Medications Ordered in Other Encounters:  .  midazolam (VERSED) injection, , Intravenous, Anesthesia Intra-op, Johnna Acosta, CRNA, 2 mg at 01/31/16 1221 .  propofol (DIPRIVAN) 500 MG/50ML infusion, , Intravenous, Continuous PRN, Johnna Acosta, CRNA, Last Rate: 83 mL/hr at 01/31/16 1224, 100 mcg/kg/min at 01/31/16 1224  Prescriptions prior to admission  Medication Sig Dispense Refill Last Dose  . CARTIA XT 180 MG 24 hr capsule TAKE TWO CAPSULES BY MOUTH ONCE DAILY 60 capsule 5 Taking  . Cholecalciferol (VITAMIN D3) 2000 UNITS capsule Take by mouth.   Taking  . DEXILANT 60 MG capsule TAKE ONE CAPSULE BY MOUTH ONCE DAILY 90 capsule 3   . DULoxetine (CYMBALTA) 30 MG capsule Take 90 mg by mouth.    Taking  . FERROUS FUMARATE-VITAMIN C PO Take by mouth.   Taking  . fluconazole (DIFLUCAN) 150 MG tablet 1 tab PO once daily, may repeat in 3 days if needed. 2 tablet 0 Taking  . Glucosamine Sulfate-MSM (MSM/GLUCOSAMINE) 250-250 MG CAPS Take by mouth.   Taking  . hydrochlorothiazide (MICROZIDE) 12.5 MG capsule TAKE ONE CAPSULE BY MOUTH ONCE DAILY  90 capsule 3   . lansoprazole (PREVACID) 30 MG capsule Take by mouth.   Taking  . levothyroxine (SYNTHROID, LEVOTHROID) 150 MCG tablet TAKE ONE TABLET BY MOUTH ONCE DAILY BEFORE BREAKFAST 90 tablet 3   . MEGARED OMEGA-3 KRILL OIL PO Take by mouth daily.   Taking  . nitrofurantoin, macrocrystal-monohydrate, (MACROBID) 100 MG capsule Take 1 capsule (100 mg total) by mouth 2 (two) times daily. 20 capsule 0 Taking  . phentermine 15 MG capsule Take 1 capsule (15 mg total) by mouth every morning. 30 capsule 0   . temazepam (RESTORIL) 30 MG capsule Take by mouth.   Taking     No Known Allergies   Past Medical History  Diagnosis Date  . GERD (gastroesophageal reflux disease)   . Obesity   . Agoraphobia with panic disorder   . Anemia   . Sleep apnea   . Depression   . Intervertebral disc disorder     thoracic,thoracolumber,lumbosacral  . Hypercholesterolemia   . Hypertension   . Geographic tongue   . History of esophagogastroduodenoscopy (EGD)     Review of systems:      Physical Exam    Heart and lungs: Regular rate and rhythm without rub or gallop, lungs are bilaterally clear.    HEENT: Normocephalic atraumatic eyes are anicteric.    Other:     Pertinant exam for procedure: Soft nontender nondistended bowel sounds positive normoactive. Patient is morbidly obese.    Planned proceedures: EGD and indicated procedures. I have discussed the risks benefits and  complications of procedures to include not limited to bleeding, infection, perforation and the risk of sedation and the patient wishes to proceed.    Lollie Sails, MD Gastroenterology 01/31/2016  12:25 PM

## 2016-01-31 NOTE — Op Note (Signed)
[facility] Gastroenterology Patient Name: Patricia Deleon Procedure Date: 01/31/2016 12:20 PM MRN: [id number] Account #: [id number] Date of Birth: [birth year] Admit Type: Outpatient Age: 55 Room: [facility] ENDO ROOM 3 Gender: Female Note Status: Finalized Procedure:         Upper GI endoscopy Indications:       Dyspepsia, Follow-up of Barrett's esophagus Providers:         Lollie Sails, MD Referring MD:      Jerrell Belfast, MD (Referring MD) Medicines:         Monitored Anesthesia Care Complications:     No immediate complications. Procedure:         Pre-Anesthesia Assessment:                    - ASA Grade Assessment: III - A patient with severe                     systemic disease.                    After obtaining informed consent, the endoscope was passed                     under direct vision. Throughout the procedure, the                     patient's blood pressure, pulse, and oxygen saturations                     were monitored continuously. The Endoscope was introduced                     through the mouth, and advanced to the third part of                     duodenum. The upper GI endoscopy was accomplished without                     difficulty. The patient tolerated the procedure well. Findings:      There were esophageal mucosal changes consistent with short-segment       Barrett's esophagus present at the gastroesophageal junction. The       maximum longitudinal extent of these mucosal changes was 1 cm in length.       Mucosa was biopsied with a cold forceps for histology in 4 quadrants.      A small hiatus hernia was present.      Multiple 1 to 5 mm sessile polyps with no bleeding and no stigmata of       recent bleeding were found in the gastric body. Biopsies were taken with       a cold forceps for histology.      Patchy mild inflammation characterized by erosions and erythema was       found in the gastric antrum. Biopsies were taken  with a cold forceps for       histology. Biopsies were taken with a cold forceps for Helicobacter       pylori testing.      The cardia and gastric fundus were normal on retroflexion.      The examined duodenum was normal. Impression:        - Esophageal mucosal changes consistent with short-segment  Barrett's esophagus. Biopsied.                    - Small hiatus hernia.                    - Multiple gastric polyps. Biopsied.                    - Erosive gastritis. Biopsied.                    - Normal examined duodenum. Recommendation:    - Continue present medications.                    - Telephone GI clinic for pathology results in 1 week. Procedure Code(s): --- Professional ---                    [phone], Esophagogastroduodenoscopy, flexible, transoral;                     with biopsy, single or multiple Diagnosis Code(s): --- Professional ---                    K22.70, Barrett's esophagus without dysplasia                    K44.9, Diaphragmatic hernia without obstruction or gangrene                    K31.7, Polyp of stomach and duodenum                    K29.60, Other gastritis without bleeding                    K30, Functional dyspepsia CPT copyright 2014 American Medical Association. All rights reserved. The codes documented in this report are preliminary and upon coder review may  be revised to meet current compliance requirements. Lollie Sails, MD 01/31/2016 12:52:11 PM This report has been signed electronically. Number of Addenda: 0 Note Initiated On: 01/31/2016 12:20 PM      [facility]

## 2016-02-01 ENCOUNTER — Encounter: Payer: Self-pay | Admitting: Gastroenterology

## 2016-02-24 ENCOUNTER — Ambulatory Visit (INDEPENDENT_AMBULATORY_CARE_PROVIDER_SITE_OTHER): Payer: 59 | Admitting: Physician Assistant

## 2016-02-24 ENCOUNTER — Encounter: Payer: Self-pay | Admitting: Physician Assistant

## 2016-02-24 VITALS — BP 122/70 | HR 109 | Temp 101.8°F | Resp 16 | Wt 306.2 lb

## 2016-02-24 DIAGNOSIS — R52 Pain, unspecified: Secondary | ICD-10-CM | POA: Diagnosis not present

## 2016-02-24 DIAGNOSIS — R300 Dysuria: Secondary | ICD-10-CM | POA: Diagnosis not present

## 2016-02-24 DIAGNOSIS — N3001 Acute cystitis with hematuria: Secondary | ICD-10-CM

## 2016-02-24 LAB — POCT URINALYSIS DIPSTICK
Bilirubin, UA: NEGATIVE
Glucose, UA: NEGATIVE
Ketones, UA: 5
Nitrite, UA: NEGATIVE
PROTEIN UA: 100
Spec Grav, UA: 1.01
UROBILINOGEN UA: 0.2
pH, UA: 7.5

## 2016-02-24 LAB — POCT INFLUENZA A/B
INFLUENZA A, POC: NEGATIVE
INFLUENZA B, POC: NEGATIVE

## 2016-02-24 MED ORDER — SULFAMETHOXAZOLE-TRIMETHOPRIM 800-160 MG PO TABS: 1.0000 | ORAL_TABLET | Freq: Two times a day (BID) | ORAL | Status: DC

## 2016-02-24 NOTE — Progress Notes (Signed)
Patient: Patricia Deleon Female    DOB: 10/21/1961   55 y.o.   MRN: [id number] Visit Date: 02/24/2016  Today's Provider: Mar Daring, PA-C   Chief Complaint  Patient presents with  . Fever   Subjective:    Fever  This is a new problem. The current episode started yesterday. The problem occurs constantly. The problem has been unchanged. Her temperature was unmeasured prior to arrival. Associated symptoms include abdominal pain, headaches, muscle aches, nausea, sleepiness and urinary pain. Pertinent negatives include no chest pain, congestion, coughing, diarrhea, ear pain, rash, sore throat, vomiting or wheezing. She has tried acetaminophen and fluids (Last dose of Tylenol was 2 or 4 am ) for the symptoms. The treatment provided no relief.       No Known Allergies Previous Medications   CARTIA XT 180 MG 24 HR CAPSULE    TAKE TWO CAPSULES BY MOUTH ONCE DAILY   CHOLECALCIFEROL (VITAMIN D3) 2000 UNITS CAPSULE    Take by mouth.   DEXILANT 60 MG CAPSULE    TAKE ONE CAPSULE BY MOUTH ONCE DAILY   DULOXETINE (CYMBALTA) 30 MG CAPSULE    Take 90 mg by mouth.    FERROUS FUMARATE-VITAMIN C PO    Take by mouth. Reported on 02/24/2016   FLUCONAZOLE (DIFLUCAN) 150 MG TABLET    1 tab PO once daily, may repeat in 3 days if needed.   GLUCOSAMINE SULFATE-MSM (MSM/GLUCOSAMINE) 250-250 MG CAPS    Take by mouth.   HYDROCHLOROTHIAZIDE (MICROZIDE) 12.5 MG CAPSULE    TAKE ONE CAPSULE BY MOUTH ONCE DAILY   LANSOPRAZOLE (PREVACID) 30 MG CAPSULE    Take by mouth. Reported on 02/24/2016   LEVOTHYROXINE (SYNTHROID, LEVOTHROID) 150 MCG TABLET    TAKE ONE TABLET BY MOUTH ONCE DAILY BEFORE BREAKFAST   MEGARED OMEGA-3 KRILL OIL PO    Take by mouth daily.   NITROFURANTOIN, MACROCRYSTAL-MONOHYDRATE, (MACROBID) 100 MG CAPSULE    Take 1 capsule (100 mg total) by mouth 2 (two) times daily.   SUCRALFATE (CARAFATE) 1 G TABLET    Take by mouth.   TEMAZEPAM (RESTORIL) 30 MG CAPSULE    Take by mouth.     Review of Systems  Constitutional: Positive for fever.  HENT: Negative for congestion, ear pain, postnasal drip, rhinorrhea, sinus pressure, sneezing and sore throat.   Respiratory: Negative for cough, chest tightness, shortness of breath and wheezing.   Cardiovascular: Negative for chest pain.  Gastrointestinal: Positive for nausea, abdominal pain and abdominal distention. Negative for vomiting and diarrhea.  Genitourinary: Positive for dysuria.  Skin: Negative for rash.  Neurological: Positive for headaches.    Social History  Substance Use Topics  . Smoking status: Never Smoker   . Smokeless tobacco: Never Used  . Alcohol Use: No   Objective:   BP 122/70 mmHg  Pulse 109  Temp(Src) 101.8 F (38.8 C) (Oral)  Resp 16  Wt 306 lb 3.2 oz (138.891 kg)  SpO2 96%  LMP 02/10/2016  Physical Exam  Constitutional: She appears well-developed and well-nourished. She appears ill. No distress.  HENT:  Head: Normocephalic and atraumatic.  Right Ear: Hearing, tympanic membrane, external ear and ear canal normal.  Left Ear: Hearing, tympanic membrane, external ear and ear canal normal.  Nose: Nose normal.  Mouth/Throat: Uvula is midline, oropharynx is clear and moist and mucous membranes are normal. No oropharyngeal exudate.  Eyes: Conjunctivae are normal. Pupils are equal, round, and reactive to light. Right eye exhibits no  discharge. Left eye exhibits no discharge. No scleral icterus.  Neck: Normal range of motion. Neck supple. No tracheal deviation present. No thyromegaly present.  Cardiovascular: Normal rate, regular rhythm and normal heart sounds.  Exam reveals no gallop and no friction rub.   No murmur heard. Pulmonary/Chest: Effort normal and breath sounds normal. No stridor. No respiratory distress. She has no wheezes. She has no rales.  Abdominal: Soft. Bowel sounds are normal. She exhibits no distension and no mass. There is tenderness in the suprapubic area. There is no  rebound and no guarding.  Lymphadenopathy:    She has no cervical adenopathy.  Skin: Skin is warm. She is diaphoretic (hot and moist to touch).  Vitals reviewed.       Assessment & Plan:     1. Acute cystitis with hematuria UA was positive for UTI. Will treat with bactrim as below. Increase fluids. Tylenol as needed for fevers. Urine culture sent.  Will adjust antibiotic therapy as directed by C&S results. She is to call the office if symptoms do not improve or worsen. - sulfamethoxazole-trimethoprim (BACTRIM DS,SEPTRA DS) 800-160 MG tablet; Take 1 tablet by mouth 2 (two) times daily.  Dispense: 20 tablet; Refill: 0  2. Dysuria UA positive for UTI. - POCT urinalysis dipstick - Urine culture  3. Body aches Flu test was negative. - POCT Influenza A/B       Mar Daring, PA-C  [facility]

## 2016-02-24 NOTE — Patient Instructions (Signed)

## 2016-02-26 LAB — URINE CULTURE

## 2016-02-27 ENCOUNTER — Telehealth: Payer: Self-pay

## 2016-02-27 DIAGNOSIS — R110 Nausea: Secondary | ICD-10-CM

## 2016-02-27 MED ORDER — ONDANSETRON 4 MG PO TBDP: 4.0000 mg | ORAL_TABLET | Freq: Three times a day (TID) | ORAL | Status: DC | PRN

## 2016-02-27 NOTE — Telephone Encounter (Signed)
Advised patient as below. Patient reports that she is not feeling any better. She reports that she is very nauseated. Patient is requesting that something be called into the pharmacy for nausea. Patient uses [facility] on [address] Thanks!

## 2016-02-27 NOTE — Telephone Encounter (Signed)
-----   Message from Mar Daring, [place] sent at 02/27/2016  8:24 AM EST ----- Urine culture positive for e.coli again. Susceptible to antibiotic used.  Continue until completed.

## 2016-02-27 NOTE — Telephone Encounter (Signed)
L/M saying that prescription is at the pharmacy.

## 2016-02-27 NOTE — Telephone Encounter (Signed)
Left message to call back  

## 2016-02-27 NOTE — Telephone Encounter (Signed)
Please advise patient zofran sent in to pharmacy.

## 2016-03-13 ENCOUNTER — Telehealth: Payer: Self-pay

## 2016-03-13 NOTE — Telephone Encounter (Signed)
Patient called c/o having this rash all over her face. She states she had this before but not the entire face. She reports it looks like acne. It only hurts if you touch it and when she tries to pop them on clear fluid comes out and it relief the pressure and the pain but it comes back up. She said No fever, no burning sensation, not itching,no pus coming out,  no joint pain. Offered patient an appointment for tomorrow, patient declined, she is scheduled for Thursday at 8:30. Advise patient that if she runs any fever, or rash gets worst or any other symptoms to call the office to be seen sooner. Patient agree.   Thanks,  -Tiasha Helvie

## 2016-03-15 ENCOUNTER — Ambulatory Visit (INDEPENDENT_AMBULATORY_CARE_PROVIDER_SITE_OTHER): Payer: 59 | Admitting: Physician Assistant

## 2016-03-15 ENCOUNTER — Encounter: Payer: Self-pay | Admitting: Physician Assistant

## 2016-03-15 VITALS — BP 118/78 | HR 88 | Temp 98.0°F | Resp 16 | Wt 303.0 lb

## 2016-03-15 DIAGNOSIS — L719 Rosacea, unspecified: Secondary | ICD-10-CM | POA: Diagnosis not present

## 2016-03-15 MED ORDER — METRONIDAZOLE 0.75 % EX CREA: TOPICAL_CREAM | Freq: Two times a day (BID) | CUTANEOUS | Status: DC

## 2016-03-15 NOTE — Patient Instructions (Signed)
Rosacea Rosacea is a long-term (chronic) condition that affects the skin of the face, including the cheeks, nose, brow, and chin. This condition can also affect the eyes. Rosacea causes blood vessels near the surface of the skin to enlarge, which results in redness. CAUSES The cause of this condition is not known. Certain triggers can make rosacea worse, including:  Hot baths.  Exercise.  Sunlight.  Very hot or cold temperatures.  Hot or spicy foods and drinks.  Drinking alcohol.  Stress.  Taking blood pressure medicine.  Long-term use of topical steroids on the face. RISK FACTORS This condition is more likely to develop in:  People who are older than 53 years of age.  Women.  People who have light-colored skin (light complexion).  People who have a family history of rosacea. SYMPTOMS  Symptoms of this condition include:  Redness of the face.  Red bumps or pimples on the face.  A red, enlarged nose.  Blushing easily.  Red lines on the skin.  Irritated or burning feeling in the eyes.  Swollen eyelids. DIAGNOSIS This condition is diagnosed with a medical history and physical exam. TREATMENT There is no cure for this condition, but treatment can help to control your symptoms. Your health care provider may recommend that you see a skin specialist (dermatologist). Treatment may include:  Antibiotic medicines that are applied to the skin or taken as a pill.  Laser treatment to improve the appearance of the skin.  Surgery. This is rare. Your health care provider will also recommend the best way to take care of your skin. Even after your skin improves, you will likely need to continue treatment to prevent your rosacea from coming back. HOME CARE INSTRUCTIONS Skin Care Take care of your skin as told by your health care provider. You may be told to do these things:  Wash your skin gently two or more times each day.  Use mild soap.  Use a sunscreen or sunblock  with SPF 30 or greater.  Use gentle cosmetics that are meant for sensitive skin.  Shave with an electric shaver instead of a blade. Lifestyle  Try to keep track of what foods trigger this condition. Avoid any triggers. These may include:  Spicy foods.  Seafood.  Cheese.  Hot liquids.  Nuts.  Chocolate.  Iodized salt.  Do not drink alcohol.  Avoid extremely cold or hot temperatures.  Try to reduce your stress. If you need help, talk with your health care provider.  When you exercise, do these things to stay cool:  Limit your sun exposure.  Use a fan.  Do shorter and more frequent intervals of exercise. General Instructions   Keep all follow-up visits as told by your health care provider. This is important.  Take over-the-counter and prescription medicines only as told by your health care provider.  If your eyelids are affected, apply warm compresses to them. Do this as told by your health care provider.  If you were prescribed an antibiotic medicine, apply or take it as told by your health care provider. Do not stop using the antibiotic even if your condition improves. SEEK MEDICAL CARE IF:  Your symptoms get worse.  Your symptoms do not improve after two months of treatment.  You have new symptoms.  You have any changes in vision or you have problems with your eyes, such as redness or itching.  You feel depressed.  You lose your appetite.  You have trouble concentrating.   This information is not   intended to replace advice given to you by your health care provider. Make sure you discuss any questions you have with your health care provider.   Document Released: 01/24/2005 Document Revised: 09/07/2015 Document Reviewed: 02/23/2015 Elsevier Interactive Patient Education 2016 [employer] Inc[employer]  

## 2016-03-15 NOTE — Progress Notes (Signed)
Patient ID: [name], female   DOB: 1961/10/05, 55 y.o.   MRN: [id number]       Patient: Naylea Mayard Female    DOB: [birth year]   55 y.o.   MRN: [id number] Visit Date: 03/15/2016  Today's Provider: Mar Daring, PA-C   Chief Complaint  Patient presents with  . Rash    X 1 week.    Subjective:    Rash This is a recurrent problem. The current episode started in the past 7 days. The problem has been gradually worsening since onset. The affected locations include the face. The rash is characterized by blistering, burning, dryness, pain, redness and peeling. She was exposed to nothing. Past treatments include nothing.  Patient reports that she has had acne all her life, but it has never been this bad. Patient reports that it is beginning to become painful to touch her face. Patient denies using any lotions or creams on her face. She has not been using anything OTC to help treat the symptoms.      No Known Allergies Previous Medications   CARTIA XT 180 MG 24 HR CAPSULE    TAKE TWO CAPSULES BY MOUTH ONCE DAILY   CHOLECALCIFEROL (VITAMIN D3) 2000 UNITS CAPSULE    Take by mouth.   DEXILANT 60 MG CAPSULE    TAKE ONE CAPSULE BY MOUTH ONCE DAILY   DULOXETINE (CYMBALTA) 30 MG CAPSULE    Take 90 mg by mouth.    FERROUS FUMARATE-VITAMIN C PO    Take by mouth. Reported on 02/24/2016   FLUCONAZOLE (DIFLUCAN) 150 MG TABLET    1 tab PO once daily, may repeat in 3 days if needed.   GLUCOSAMINE SULFATE-MSM (MSM/GLUCOSAMINE) 250-250 MG CAPS    Take by mouth.   HYDROCHLOROTHIAZIDE (MICROZIDE) 12.5 MG CAPSULE    TAKE ONE CAPSULE BY MOUTH ONCE DAILY   LANSOPRAZOLE (PREVACID) 30 MG CAPSULE    Take by mouth. Reported on 02/24/2016   LEVOTHYROXINE (SYNTHROID, LEVOTHROID) 150 MCG TABLET    TAKE ONE TABLET BY MOUTH ONCE DAILY BEFORE BREAKFAST   MEGARED OMEGA-3 KRILL OIL PO    Take by mouth daily.   NITROFURANTOIN, MACROCRYSTAL-MONOHYDRATE, (MACROBID) 100 MG CAPSULE    Take 1 capsule (100 mg  total) by mouth 2 (two) times daily.   ONDANSETRON (ZOFRAN-ODT) 4 MG DISINTEGRATING TABLET    Take 1 tablet (4 mg total) by mouth every 8 (eight) hours as needed for nausea or vomiting.   SUCRALFATE (CARAFATE) 1 G TABLET    Take by mouth.   SULFAMETHOXAZOLE-TRIMETHOPRIM (BACTRIM DS,SEPTRA DS) 800-160 MG TABLET    Take 1 tablet by mouth 2 (two) times daily.   TEMAZEPAM (RESTORIL) 30 MG CAPSULE    Take by mouth.    Review of Systems  Constitutional: Negative.   HENT: Negative.   Skin: Positive for color change (redness of face) and rash.  Neurological: Negative.     Social History  Substance Use Topics  . Smoking status: Never Smoker   . Smokeless tobacco: Never Used  . Alcohol Use: No   Objective:   BP 118/78 mmHg  Pulse 88  Temp(Src) 98 F (36.7 C)  Resp 16  Wt 303 lb (137.44 kg)  LMP 02/10/2016  Physical Exam  Constitutional: She appears well-developed and well-nourished. No distress.  Neck: Normal range of motion. Neck supple. No JVD present. No tracheal deviation present. No thyromegaly present.  Cardiovascular: Normal rate, regular rhythm and normal heart sounds.  Exam reveals no gallop  and no friction rub.   No murmur heard. Pulmonary/Chest: Effort normal and breath sounds normal. No respiratory distress. She has no wheezes. She has no rales.  Lymphadenopathy:    She has no cervical adenopathy.  Skin: Rash noted. Rash is papular and pustular. She is not diaphoretic. There is erythema.     Vitals reviewed.       Assessment & Plan:     1. 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  Will treat with metrocream as below. Advised to use for two weeks or until rosacea clears then stop.  May restart if rosacea returns. Advised to use sensitive skin face wash and lotions to prevent recurrence.  She is to call if symptoms worsen. - metroNIDAZOLE (METROCREAM) 0.75 % cream; Apply topically 2 (two) times daily.   Dispense: 45 g; Refill: 0       Mar Daring, PA-C  [facility]

## 2016-04-17 ENCOUNTER — Other Ambulatory Visit: Payer: Self-pay | Admitting: Family Medicine

## 2016-04-17 DIAGNOSIS — F329 Major depressive disorder, single episode, unspecified: Secondary | ICD-10-CM

## 2016-04-17 DIAGNOSIS — F32A Depression, unspecified: Secondary | ICD-10-CM

## 2016-04-17 MED ORDER — DULOXETINE HCL 30 MG PO CPEP: 90.0000 mg | ORAL_CAPSULE | Freq: Every day | ORAL | Status: DC

## 2016-04-17 NOTE — Telephone Encounter (Signed)
Pt contacted office for refill request on the following medications:  DULoxetine (CYMBALTA) 30 MG capsule.  [place]  [phone]

## 2016-04-18 ENCOUNTER — Other Ambulatory Visit: Payer: Self-pay | Admitting: Physician Assistant

## 2016-04-29 ENCOUNTER — Other Ambulatory Visit: Payer: Self-pay | Admitting: Family Medicine

## 2016-04-29 DIAGNOSIS — I10 Essential (primary) hypertension: Secondary | ICD-10-CM

## 2016-05-07 ENCOUNTER — Telehealth: Payer: Self-pay | Admitting: Physician Assistant

## 2016-05-07 DIAGNOSIS — L719 Rosacea, unspecified: Secondary | ICD-10-CM | POA: Insufficient documentation

## 2016-05-07 NOTE — Telephone Encounter (Signed)
Referral to dermatology placed.  

## 2016-05-07 NOTE — Telephone Encounter (Signed)
Pt would like to go ahead with a referral dermatology for her acne rosacea. Pt stated that she has a really bad outbreak right now and it is bothering her. Pt would like to get in somewhere asap. Please advise. Thanks TNP

## 2016-08-22 ENCOUNTER — Encounter: Payer: Self-pay | Admitting: Family Medicine

## 2016-08-22 ENCOUNTER — Ambulatory Visit (INDEPENDENT_AMBULATORY_CARE_PROVIDER_SITE_OTHER): Payer: 59 | Admitting: Family Medicine

## 2016-08-22 VITALS — BP 122/72 | HR 82 | Temp 98.4°F | Resp 16 | Wt 307.8 lb

## 2016-08-22 DIAGNOSIS — M545 Low back pain, unspecified: Secondary | ICD-10-CM

## 2016-08-22 DIAGNOSIS — N3090 Cystitis, unspecified without hematuria: Secondary | ICD-10-CM

## 2016-08-22 DIAGNOSIS — G4700 Insomnia, unspecified: Secondary | ICD-10-CM

## 2016-08-22 DIAGNOSIS — M533 Sacrococcygeal disorders, not elsewhere classified: Secondary | ICD-10-CM | POA: Diagnosis not present

## 2016-08-22 LAB — POCT URINALYSIS DIPSTICK
Bilirubin, UA: NEGATIVE
GLUCOSE UA: NEGATIVE
KETONES UA: NEGATIVE
Nitrite, UA: NEGATIVE
PROTEIN UA: NEGATIVE
Spec Grav, UA: <=1.005
Urobilinogen, UA: 0.2
pH, UA: 6.5

## 2016-08-22 MED ORDER — TEMAZEPAM 30 MG PO CAPS: 30.0000 mg | ORAL_CAPSULE | Freq: Every evening | ORAL | 5 refills | Status: DC | PRN

## 2016-08-22 MED ORDER — NITROFURANTOIN MONOHYD MACRO 100 MG PO CAPS: 100.0000 mg | ORAL_CAPSULE | Freq: Two times a day (BID) | ORAL | 0 refills | Status: DC

## 2016-08-22 MED ORDER — CELECOXIB 200 MG PO CAPS: 200.0000 mg | ORAL_CAPSULE | Freq: Two times a day (BID) | ORAL | 1 refills | Status: DC

## 2016-08-22 MED ORDER — HYDROCODONE-ACETAMINOPHEN 5-325 MG PO TABS: ORAL_TABLET | ORAL | 0 refills | Status: DC

## 2016-08-22 NOTE — Patient Instructions (Signed)
Get a foam doughnut to sit on.

## 2016-08-22 NOTE — Progress Notes (Signed)
Subjective:     Patient ID: Patricia Deleon, female   DOB: 1961/05/13, 55 y.o.   MRN: [id number]  HPI  Chief Complaint  Patient presents with  . Fall    Patient comes in office today address fall that happened on 08/21/16. Patient states that she was walking down stairs to take dog out and slipped on carpet on stairs and fell on buttock. Patient states when she hit on stairs she slid down and landed on her back.  . Urinary Tract Infection    Patient comes in office concerned about urinary infection. Patient reports for the past 4 weeks she has had pain with urination, frequency and incontinence.   States she is having pain in her left lower back but primarily in her tailbone. No radiation of pain. 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   Review of Systems     Objective:   Physical Exam  Constitutional: She appears well-developed and well-nourished. She appears distressed (moderate pain with changing positions).  Musculoskeletal:  Muscle strength in lower extremities 5/5. SLR's to 90 degrees without radiation of back pain. Tender over her left SI area and Coccyx.       Assessment:    1. Cystitis - Urine culture - POCT urinalysis dipstick - nitrofurantoin, macrocrystal-monohydrate, (MACROBID) 100 MG capsule; Take 1 capsule (100 mg total) by mouth 2 (two) times daily.  Dispense: 14 capsule; Refill: 0  2. Left-sided low back pain without sciatica - celecoxib (CELEBREX) 200 MG capsule; Take 1 capsule (200 mg total) by mouth 2 (two) times daily. Take with food  Dispense: 28 capsule; Refill: 1 - HYDROcodone-acetaminophen (NORCO/VICODIN) 5-325 MG tablet; One every 4-6 hours as needed for pain  Dispense: 28 tablet; Refill: 0  3. Coccydynia - celecoxib (CELEBREX) 200 MG capsule; Take 1 capsule (200 mg total) by mouth 2 (two) times daily. Take with food  Dispense: 28 capsule; Refill: 1 - HYDROcodone-acetaminophen (NORCO/VICODIN)  5-325 MG tablet; One every 4-6 hours as needed for pain  Dispense: 28 tablet; Refill: 0  4. Insomnia - temazepam (RESTORIL) 30 MG capsule; Take 1 capsule (30 mg total) by mouth at bedtime as needed for sleep.  Dispense: 30 capsule; Refill: 5    Plan:    Further f/u pending urine culture. Discussed using a foam doughnut.

## 2016-08-24 ENCOUNTER — Other Ambulatory Visit: Payer: Self-pay | Admitting: Family Medicine

## 2016-08-24 DIAGNOSIS — N3090 Cystitis, unspecified without hematuria: Secondary | ICD-10-CM

## 2016-08-24 LAB — URINE CULTURE

## 2016-08-24 MED ORDER — CEPHALEXIN 500 MG PO CAPS: 500.0000 mg | ORAL_CAPSULE | Freq: Two times a day (BID) | ORAL | 0 refills | Status: DC

## 2016-09-11 ENCOUNTER — Ambulatory Visit (INDEPENDENT_AMBULATORY_CARE_PROVIDER_SITE_OTHER): Payer: 59 | Admitting: Family Medicine

## 2016-09-11 ENCOUNTER — Encounter: Payer: Self-pay | Admitting: Family Medicine

## 2016-09-11 VITALS — BP 122/70 | HR 82 | Temp 98.5°F | Resp 16 | Wt 312.8 lb

## 2016-09-11 DIAGNOSIS — N3946 Mixed incontinence: Secondary | ICD-10-CM | POA: Diagnosis not present

## 2016-09-11 DIAGNOSIS — M545 Low back pain, unspecified: Secondary | ICD-10-CM

## 2016-09-11 DIAGNOSIS — M533 Sacrococcygeal disorders, not elsewhere classified: Secondary | ICD-10-CM

## 2016-09-11 DIAGNOSIS — K219 Gastro-esophageal reflux disease without esophagitis: Secondary | ICD-10-CM | POA: Diagnosis not present

## 2016-09-11 LAB — POCT URINALYSIS DIPSTICK
Blood, UA: NEGATIVE
Glucose, UA: NEGATIVE
KETONES UA: NEGATIVE
LEUKOCYTES UA: NEGATIVE
NITRITE UA: NEGATIVE
Spec Grav, UA: 1.02
Urobilinogen, UA: 1
pH, UA: 5

## 2016-09-11 MED ORDER — HYDROCODONE-ACETAMINOPHEN 5-325 MG PO TABS: ORAL_TABLET | ORAL | 0 refills | Status: DC

## 2016-09-11 MED ORDER — SUCRALFATE 1 G PO TABS: 1.0000 g | ORAL_TABLET | Freq: Two times a day (BID) | ORAL | 5 refills | Status: DC

## 2016-09-11 NOTE — Progress Notes (Signed)
Subjective:     Patient ID: Patricia Deleon, female   DOB: 08-04-1961, 55 y.o.   MRN: [id number]  HPI  Chief Complaint  Patient presents with  . Back Pain    Patient comes in office today to address lower bak pain, patient reports that she was seen in office a month ago after falling down her stairs at home. Patient reports that she has been in pain and has difficulty when sitting  . Flank Pain    Patient has concerns of a urinary tract infection stating that she has pain on her lower right side of her back and frequency of urination for the past several days. Patient is also requesting today a referral to Urology  States she has been sitting awkwardly due to her tailbone pain and feels she may have strained her right lower back. Wishes refills on pain medication which helps her sleep and get through her day along with Celebrex. Has hx of both stress and urge incontinence and wishes further urology evaluation. 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 Denies significant lifting or re-injury.   Review of Systems  Gastrointestinal:       Wishes refill on Carafate-adjunct medication for Barrett's esophagus.       Objective:   Physical Exam  Constitutional: She appears well-developed and well-nourished. She appears distressed (mild pain when changing positions).  Musculoskeletal:  Muscle strength in lower extremities 5/5. SLR's to 90 degrees without radiation of back pain. Tender over her right SI area.       Assessment:    1. Right-sided low back pain without sciatica - POCT urinalysis dipstick  2. Coccydynia - HYDROcodone-acetaminophen (NORCO/VICODIN) 5-325 MG tablet; One every 4-6 hours as needed for pain  Dispense: 28 tablet; Refill: 0  3. Gastroesophageal reflux disease, esophagitis presence not specified - sucralfate (CARAFATE) 1 g tablet; Take 1 tablet (1 g total) by mouth 2 (two) times daily.  Dispense: 60 tablet; Refill: 5  4. Mixed incontinence - Ambulatory  referral to Urology  5. Left-sided low back pain without sciatica: Will continue with Celebrex - HYDROcodone-acetaminophen (NORCO/VICODIN) 5-325 MG tablet; One every 4-6 hours as needed for pain  Dispense: 28 tablet; Refill: 0    Plan:    Discussed icing her back at the end of her workday.

## 2016-09-11 NOTE — Patient Instructions (Signed)
Continue Celebrex. We will call you with the referral time. Consider ice pack for 20 minutes to your back at the end of your workday.

## 2016-09-27 ENCOUNTER — Ambulatory Visit: Payer: 59 | Admitting: Urology

## 2016-10-03 ENCOUNTER — Ambulatory Visit: Payer: 59 | Admitting: Urology

## 2016-10-12 ENCOUNTER — Ambulatory Visit: Payer: 59 | Admitting: Urology

## 2016-10-12 ENCOUNTER — Encounter: Payer: Self-pay | Admitting: Urology

## 2016-11-14 ENCOUNTER — Encounter: Payer: Self-pay | Admitting: Urology

## 2016-11-14 ENCOUNTER — Ambulatory Visit (INDEPENDENT_AMBULATORY_CARE_PROVIDER_SITE_OTHER): Payer: 59 | Admitting: Urology

## 2016-11-14 VITALS — BP 143/78 | HR 82 | Ht 66.0 in | Wt 311.9 lb

## 2016-11-14 DIAGNOSIS — N390 Urinary tract infection, site not specified: Secondary | ICD-10-CM

## 2016-11-14 DIAGNOSIS — N3946 Mixed incontinence: Secondary | ICD-10-CM | POA: Diagnosis not present

## 2016-11-14 DIAGNOSIS — N393 Stress incontinence (female) (male): Secondary | ICD-10-CM

## 2016-11-14 LAB — BLADDER SCAN AMB NON-IMAGING: Scan Result: 73

## 2016-11-14 NOTE — Progress Notes (Signed)
11/14/2016 11:57 AM   Patricia Deleon [birth year] [id number]  Referring provider: Margarita Rana, MD No address on file  Chief Complaint  Patient presents with  . New Patient (Initial Visit)    mixed incontinence     HPI: 55 year old female who presents today for further evaluation of urinary leakage.  She does have rare urgency and without urge incontinence.  This has been going on for quite some time but significantly worsened over the past year.  She complains of leakage with activity.  She is wearing 1 ppd which is damp each day.  She is very bothered by this particularly due to the smell of stale urine.   Morbidly obese (BMI 50), she has gained 90 lb in the past two tears.  Prediabetic.     She has had 2 children, vaginal deliveries.  She did have stage 4 lacerations with one child.    She does have uterus.   She is not yet menopausal, having regular heavy periods.  She is having hot flashes.    She has had 2 UTIs over the past year, Proteus 08/22/16 and E. Coli 02/24/16.  Prior to these two infection, they were rare.     PMH: Past Medical History:  Diagnosis Date  . Agoraphobia with panic disorder   . Anemia   . Depression   . Geographic tongue   . GERD (gastroesophageal reflux disease)   . History of esophagogastroduodenoscopy (EGD)   . Hypercholesterolemia   . Hypertension   . Intervertebral disc disorder    thoracic,thoracolumber,lumbosacral  . Obesity   . Sleep apnea     Surgical History: Past Surgical History:  Procedure Laterality Date  . COLONOSCOPY    . ESOPHAGOGASTRODUODENOSCOPY (EGD) WITH PROPOFOL N/A 01/31/2016   Procedure: ESOPHAGOGASTRODUODENOSCOPY (EGD) WITH PROPOFOL;  Surgeon: Lollie Sails, MD;  Location: [facility] ENDOSCOPY;  Service: Endoscopy;  Laterality: N/A;  . NISSEN FUNDOPLICATION      Home Medications:    Medication List       Accurate as of 11/14/16 11:57 AM. Always use your most recent med list.          ACTICLATE 150  MG Tabs Generic drug:  Doxycycline Hyclate 1 tablet by mouth daily; take with food   CARTIA XT 180 MG 24 hr capsule Generic drug:  diltiazem TAKE TWO CAPSULES BY MOUTH ONCE DAILY   celecoxib 200 MG capsule Commonly known as:  CELEBREX Take 1 capsule (200 mg total) by mouth 2 (two) times daily. Take with food   clindamycin-tretinoin gel Commonly known as:  ZIANA Apply topically at bedtime.   DEXILANT 60 MG capsule Generic drug:  dexlansoprazole TAKE ONE CAPSULE BY MOUTH ONCE DAILY   DULoxetine 30 MG capsule Commonly known as:  CYMBALTA Take 3 capsules (90 mg total) by mouth daily.   FERROUS FUMARATE-VITAMIN C PO Take by mouth. Reported on 02/24/2016   fluconazole 150 MG tablet Commonly known as:  DIFLUCAN 1 tab PO once daily, may repeat in 3 days if needed.   hydrochlorothiazide 12.5 MG capsule Commonly known as:  MICROZIDE TAKE ONE CAPSULE BY MOUTH ONCE DAILY   HYDROcodone-acetaminophen 5-325 MG tablet Commonly known as:  NORCO/VICODIN One every 4-6 hours as needed for pain   levothyroxine 150 MCG tablet Commonly known as:  SYNTHROID, LEVOTHROID TAKE ONE TABLET BY MOUTH ONCE DAILY BEFORE BREAKFAST   MEGARED OMEGA-3 KRILL OIL PO Take by mouth daily.   MSM/Glucosamine 250-250 MG Caps Take by mouth.   PREVACID 30 MG  capsule Generic drug:  lansoprazole Take by mouth. Reported on 02/24/2016   SOOLANTRA 1 % Crea Generic drug:  Ivermectin APPLY ONE application ON THE SKIN AT bedtime   sucralfate 1 g tablet Commonly known as:  CARAFATE Take 1 tablet (1 g total) by mouth 2 (two) times daily.   temazepam 30 MG capsule Commonly known as:  RESTORIL Take 1 capsule (30 mg total) by mouth at bedtime as needed for sleep.   Vitamin D3 2000 units capsule Take by mouth.       Allergies: No Known Allergies  Family History: Family History  Problem Relation Age of Onset  . Lung cancer Mother   . Hypertension Mother   . Alcohol abuse Mother   . Anxiety disorder  Mother   . Hypertension Father   . Arthritis Father   . Alcohol abuse Father   . Esophageal cancer Father   . Alcohol abuse Brother   . Hypertension Brother   . Autism Son   . Cancer Maternal Grandmother   . Coronary artery disease Maternal Grandfather   . Heart attack Maternal Grandfather   . CVA Paternal Grandmother   . Skin cancer Paternal Grandmother     Social History:  reports that she has never smoked. She has never used smokeless tobacco. She reports that she does not drink alcohol or use drugs.  ROS: UROLOGY Frequent Urination?: No Hard to postpone urination?: Yes Burning/pain with urination?: No Get up at night to urinate?: Yes Leakage of urine?: Yes Urine stream starts and stops?: Yes Trouble starting stream?: Yes Do you have to strain to urinate?: No Blood in urine?: No Urinary tract infection?: Yes Sexually transmitted disease?: No Injury to kidneys or bladder?: No Painful intercourse?: No Weak stream?: No Currently pregnant?: No Vaginal bleeding?: No Last menstrual period?: n  Gastrointestinal Nausea?: No Vomiting?: No Indigestion/heartburn?: No Diarrhea?: No Constipation?: No  Constitutional Fever: No Night sweats?: Yes Weight loss?: No Fatigue?: Yes  Skin Skin rash/lesions?: No Itching?: No  Eyes Blurred vision?: No Double vision?: No  Ears/Nose/Throat Sore throat?: No Sinus problems?: No  Hematologic/Lymphatic Swollen glands?: No Easy bruising?: Yes  Cardiovascular Leg swelling?: No Chest pain?: No  Respiratory Cough?: Yes Shortness of breath?: No  Endocrine Excessive thirst?: No  Musculoskeletal Back pain?: No Joint pain?: No  Neurological Headaches?: No Dizziness?: Yes  Psychologic Depression?: Yes Anxiety?: No  Physical Exam: BP (!) 143/78   Pulse 82   Ht 5\' 6"  (1.676 m)   Wt (!) 311 lb 14.4 oz (141.5 kg)   BMI 50.34 kg/m   Constitutional:  Alert and oriented, No acute distress. HEENT: [place] AT, moist  mucus membranes.  Trachea midline, no masses. Cardiovascular: No clubbing, cyanosis, or edema. Respiratory: Normal respiratory effort, no increased work of breathing. GI: Abdomen is soft, nontender, nondistended, no abdominal masses.  Obese.   Skin: No rashes, bruises or suspicious lesions. Neurologic: Grossly intact, no focal deficits, moving all 4 extremities. Psychiatric: Normal mood and affect.  Laboratory Data: Lab Results  Component Value Date   WBC 7.4 09/28/2015   HCT 45.3 09/28/2015   MCV 91 09/28/2015   PLT 271 09/28/2015    Lab Results  Component Value Date   CREATININE 0.85 09/28/2015    Lab Results  Component Value Date   HGBA1C 5.8 (H) 09/28/2015    Urinalysis    Component Value Date/Time   BILIRUBINUR small 09/11/2016 0822   PROTEINUR trace 09/11/2016 0822   UROBILINOGEN 1.0 09/11/2016 0822   NITRITE  negative 09/11/2016 0822   LEUKOCYTESUR Negative 09/11/2016 [id number]    Pertinent Imaging: n/a  Assessment & Plan:    1. SUI (stress urinary incontinence, female) Primary urge incontinence symptoms, bothersome  No evidence of overflow incontinence We discussed the pathophysiology of stress urinary incontinence Suspect urinary symptoms are exacerbated by recent 90 pound weight gain We discussed conservative management for stress urinary incontinence as primary modality including weight loss and pelvic floor exercises She would like to be referred to physical therapy We briefly discussed surgical intervention but would not recommend at this time until conservative intervention has been exhausted - Urinalysis, Complete - BLADDER SCAN AMB NON-IMAGING - Ambulatory referral to Physical Therapy  2. Recurrent UTI Generalized prevention strategies were discussed including hygiene issues, avoiding perfumed products and douches, voiding after sexual activity, cranberry tablets, probiotics, amongst others excellent  Advised to return if she develops recurrent UTI  symptoms   Return if symptoms worsen or fail to improve.  Hollice Espy, MD  [facility] [address], [place], [place] [address] [phone]

## 2016-11-28 ENCOUNTER — Ambulatory Visit
Admission: RE | Admit: 2016-11-28 | Discharge: 2016-11-28 | Disposition: A | Discharge status: Home / Self Care | Payer: 59 | Source: Ambulatory Visit | Attending: Physician Assistant | Admitting: Physician Assistant

## 2016-11-28 ENCOUNTER — Ambulatory Visit (INDEPENDENT_AMBULATORY_CARE_PROVIDER_SITE_OTHER): Payer: 59 | Admitting: Physician Assistant

## 2016-11-28 ENCOUNTER — Encounter: Payer: Self-pay | Admitting: Physician Assistant

## 2016-11-28 VITALS — BP 128/68 | HR 80 | Temp 98.5°F | Resp 16 | Wt 309.0 lb

## 2016-11-28 DIAGNOSIS — R140 Abdominal distension (gaseous): Secondary | ICD-10-CM | POA: Diagnosis not present

## 2016-11-28 DIAGNOSIS — R1084 Generalized abdominal pain: Secondary | ICD-10-CM

## 2016-11-28 DIAGNOSIS — K5900 Constipation, unspecified: Secondary | ICD-10-CM | POA: Diagnosis not present

## 2016-11-28 LAB — POCT URINALYSIS DIPSTICK
Bilirubin, UA: NEGATIVE
Glucose, UA: NEGATIVE
Ketones, UA: NEGATIVE
Leukocytes, UA: NEGATIVE
NITRITE UA: NEGATIVE
PH UA: 6
PROTEIN UA: NEGATIVE
SPEC GRAV UA: 1.02
UROBILINOGEN UA: 0.2

## 2016-11-28 NOTE — Progress Notes (Signed)
Patient: Patricia Deleon Female    DOB: [birth year]   55 y.o.   MRN: [id number] Visit Date: 11/28/2016  Today's Provider: Mar Daring, PA-C   Chief Complaint  Patient presents with  . Bloated   Subjective:    HPI  Patient reports that she has had a bloating sensation for about 6 days. Patient reports, "I feel like I have a balloon in my stomach." Patient reports that she is unable to get through a full meal without feeling full right after. She also mentions that her bowels have not been regular. She reports that she has not had a BM in 2 days.      No Known Allergies   Current Outpatient Prescriptions:  .  ACTICLATE 150 MG TABS, 1 tablet by mouth daily; take with food, Disp: , Rfl: 2 .  CARTIA XT 180 MG 24 hr capsule, TAKE TWO CAPSULES BY MOUTH ONCE DAILY, Disp: 60 capsule, Rfl: 5 .  Cholecalciferol (VITAMIN D3) 2000 UNITS capsule, Take by mouth., Disp: , Rfl:  .  clindamycin-tretinoin (ZIANA) gel, Apply topically at bedtime., Disp: , Rfl:  .  DEXILANT 60 MG capsule, TAKE ONE CAPSULE BY MOUTH ONCE DAILY, Disp: 90 capsule, Rfl: 3 .  DULoxetine (CYMBALTA) 30 MG capsule, Take 3 capsules (90 mg total) by mouth daily., Disp: 90 capsule, Rfl: 6 .  FERROUS FUMARATE-VITAMIN C PO, Take by mouth. Reported on 02/24/2016, Disp: , Rfl:  .  Glucosamine Sulfate-MSM (MSM/GLUCOSAMINE) 250-250 MG CAPS, Take by mouth., Disp: , Rfl:  .  hydrochlorothiazide (MICROZIDE) 12.5 MG capsule, TAKE ONE CAPSULE BY MOUTH ONCE DAILY, Disp: 90 capsule, Rfl: 3 .  lansoprazole (PREVACID) 30 MG capsule, Take by mouth. Reported on 02/24/2016, Disp: , Rfl:  .  levothyroxine (SYNTHROID, LEVOTHROID) 150 MCG tablet, TAKE ONE TABLET BY MOUTH ONCE DAILY BEFORE BREAKFAST, Disp: 90 tablet, Rfl: 3 .  MEGARED OMEGA-3 KRILL OIL PO, Take by mouth daily., Disp: , Rfl:  .  SOOLANTRA 1 % CREA, APPLY ONE application ON THE SKIN AT bedtime, Disp: , Rfl: 2 .  sucralfate (CARAFATE) 1 g tablet, Take 1 tablet (1 g total)  by mouth 2 (two) times daily., Disp: 60 tablet, Rfl: 5 .  temazepam (RESTORIL) 30 MG capsule, Take 1 capsule (30 mg total) by mouth at bedtime as needed for sleep., Disp: 30 capsule, Rfl: 5 .  celecoxib (CELEBREX) 200 MG capsule, Take 1 capsule (200 mg total) by mouth 2 (two) times daily. Take with food (Patient not taking: Reported on 11/28/2016), Disp: 28 capsule, Rfl: 1 .  fluconazole (DIFLUCAN) 150 MG tablet, 1 tab PO once daily, may repeat in 3 days if needed. (Patient not taking: Reported on 11/28/2016), Disp: 2 tablet, Rfl: 0 .  HYDROcodone-acetaminophen (NORCO/VICODIN) 5-325 MG tablet, One every 4-6 hours as needed for pain (Patient not taking: Reported on 11/28/2016), Disp: 28 tablet, Rfl: 0  Review of Systems  Constitutional: Positive for activity change, appetite change and fatigue. Negative for chills, diaphoresis, fever and unexpected weight change.  Respiratory: Negative.   Cardiovascular: Negative.   Gastrointestinal: Positive for abdominal distention, abdominal pain, constipation and diarrhea. Negative for anal bleeding, blood in stool, nausea, rectal pain and vomiting.  Musculoskeletal: Negative for back pain.  Neurological: Negative.     Social History  Substance Use Topics  . Smoking status: Never Smoker  . Smokeless tobacco: Never Used  . Alcohol use No   Objective:   BP 128/68 (BP Location: Right Arm, Patient  Position: Sitting, Cuff Size: Large)   Pulse 80   Temp 98.5 F (36.9 C)   Resp 16   Wt (!) 309 lb (140.2 kg)   BMI 49.87 kg/m   Physical Exam  Constitutional: She is oriented to person, place, and time. She appears well-developed and well-nourished. No distress.  Cardiovascular: Normal rate, regular rhythm and normal heart sounds.  Exam reveals no gallop and no friction rub.   No murmur heard. Pulmonary/Chest: Effort normal and breath sounds normal. No respiratory distress. She has no wheezes. She has no rales.  Abdominal: Soft. Normal appearance and  bowel sounds are normal. She exhibits no distension and no mass. There is no hepatosplenomegaly. There is generalized tenderness. There is no rebound, no guarding and no CVA tenderness.  Neurological: She is alert and oriented to person, place, and time.  Skin: Skin is warm and dry. She is not diaphoretic.   CLINICAL DATA:  Abdominal bloating for the past 6 days with constipation for the past 2 days. Patient porch generalized abdominal tenderness tenderness in the periumbilical and left lower quadrant regions.  EXAM: ABDOMEN - 2 VIEW  COMPARISON:  None in PACs  FINDINGS: The colonic stool burden is moderate. There is no small or large bowel obstructive pattern. No free extraluminal gas collections are observed. The bony structures exhibit no acute abnormalities.  IMPRESSION: Increased colonic stool burden consistent with constipation in the appropriate clinical setting. No evidence of obstruction, ileus, or perforation.   Electronically Signed   By: David  Martinique M.D.   On: 11/28/2016 17:16    Assessment & Plan:     1. Bloating Unknown source at this time. Possible constipation. We'll check labs as below and will get x-ray of abdomen. I will follow-up pending these results. - CBC with Differential - H. pylori antibody, IgG - DG Abd 2 Views; Future - Comprehensive Metabolic Panel (CMET) - Lipase  2. Generalized abdominal pain See above medical treatment plan. - CBC with Differential - Comprehensive Metabolic Panel (CMET) - Lipase - POCT urinalysis dipstick  3. Constipation, unspecified constipation type Labs were unremarkable, H. pylori negative. X-ray was positive for moderate stool burden. Advised patient to start MiraLAX daily with 2 capfuls daily until she is having a daily loose stool. She is then to decrease to 1 capful daily and keep stools loose. Also increase water intake. She is to call if symptoms do not improve.       Mar Daring, PA-C    [facility]

## 2016-11-28 NOTE — Patient Instructions (Signed)

## 2016-11-29 ENCOUNTER — Telehealth: Payer: Self-pay

## 2016-11-29 NOTE — Telephone Encounter (Signed)
-----   Message from Mar Daring, [place] sent at 11/28/2016  5:35 PM EST ----- Moderate stool burden so it appears to be constipation. Still awaiting lab results. Increase miralax to twice daily until loose bowel movement then back down to once daily then may go back to as needed use. I will adjust therapy if needed pending lab results.

## 2016-11-29 NOTE — Telephone Encounter (Signed)
[id number] 

## 2016-11-29 NOTE — Telephone Encounter (Signed)
Patient has been advised. KW 

## 2016-11-30 ENCOUNTER — Telehealth: Payer: Self-pay | Admitting: Physician Assistant

## 2016-11-30 LAB — COMPREHENSIVE METABOLIC PANEL
A/G RATIO: 1.5 (ref 1.2–2.2)
ALBUMIN: 4.5 g/dL (ref 3.5–5.5)
ALK PHOS: 88 IU/L (ref 39–117)
ALT: 15 IU/L (ref 0–32)
AST: 16 IU/L (ref 0–40)
BUN / CREAT RATIO: 18 (ref 9–23)
BUN: 17 mg/dL (ref 6–24)
Bilirubin Total: 0.4 mg/dL (ref 0.0–1.2)
CALCIUM: 9.3 mg/dL (ref 8.7–10.2)
CO2: 24 mmol/L (ref 18–29)
CREATININE: 0.95 mg/dL (ref 0.57–1.00)
Chloride: 98 mmol/L (ref 96–106)
GFR calc Af Amer: 79 mL/min/{1.73_m2} (ref >59)
GFR, EST NON AFRICAN AMERICAN: 69 mL/min/{1.73_m2} (ref >59)
GLOBULIN, TOTAL: 3 g/dL (ref 1.5–4.5)
Glucose: 136 mg/dL — ABNORMAL HIGH (ref 65–99)
POTASSIUM: 3.8 mmol/L (ref 3.5–5.2)
SODIUM: 139 mmol/L (ref 134–144)
Total Protein: 7.5 g/dL (ref 6.0–8.5)

## 2016-11-30 LAB — CBC WITH DIFFERENTIAL/PLATELET
BASOS ABS: 0 10*3/uL (ref 0.0–0.2)
BASOS: 1 %
EOS (ABSOLUTE): 0 10*3/uL (ref 0.0–0.4)
Eos: 0 %
Hematocrit: 39.1 % (ref 34.0–46.6)
Hemoglobin: 13 g/dL (ref 11.1–15.9)
IMMATURE GRANS (ABS): 0 10*3/uL (ref 0.0–0.1)
Immature Granulocytes: 0 %
LYMPHS ABS: 2 10*3/uL (ref 0.7–3.1)
LYMPHS: 35 %
MCH: 27.8 pg (ref 26.6–33.0)
MCHC: 33.2 g/dL (ref 31.5–35.7)
MCV: 84 fL (ref 79–97)
MONOS ABS: 0.2 10*3/uL (ref 0.1–0.9)
Monocytes: 3 %
NEUTROS ABS: 3.4 10*3/uL (ref 1.4–7.0)
Neutrophils: 61 %
PLATELETS: 256 10*3/uL (ref 150–379)
RBC: 4.68 x10E6/uL (ref 3.77–5.28)
RDW: 15.5 % — ABNORMAL HIGH (ref 12.3–15.4)
WBC: 5.6 10*3/uL (ref 3.4–10.8)

## 2016-11-30 LAB — LIPASE: Lipase: 25 U/L (ref 14–72)

## 2016-11-30 LAB — H. PYLORI ANTIBODY, IGG: H Pylori IgG: <0.9 U/mL (ref 0.0–0.8)

## 2016-11-30 NOTE — Telephone Encounter (Signed)
Busy tone-aa 

## 2016-11-30 NOTE — Telephone Encounter (Signed)
Miralax can take 72 hrs to work sometimes. Since she has still not had a BM she could try magnesium citrate or an enema to see if that would help. If she gets to where she is not even passing gas she may need to go to the ER. There was no blockage noted on xray but if she is unable to pass gas that could be a possibility.

## 2016-11-30 NOTE — Telephone Encounter (Signed)
Please review below-aa 

## 2016-11-30 NOTE — Telephone Encounter (Signed)
Per patient she is doing two scoops of Miralax a day and she still no bowel movement. She wants to know how she can do the miralax to help her go to the bathroom. Please advise.  Thanks,  -Patricia Deleon

## 2016-11-30 NOTE — Telephone Encounter (Signed)
Pt would like Jenni's nurse to return her call. Pt stated she has some questions about her continuing to take Miralax. Thanks TNP

## 2016-12-03 NOTE — Telephone Encounter (Signed)
Ok. Thanks!

## 2016-12-03 NOTE — Telephone Encounter (Signed)
FYI She reports that she did had a bowel movement Friday night and more tiny one's. She was advised as directed below. She said she is going try but if unable to get any better that she is going to call us back so that she can be refer to Dr. Gustavo Lah.  Thanks,  -Apryl Brymer

## 2016-12-03 NOTE — Telephone Encounter (Signed)
Please call pt, I was doing messages on Friday in triage=aa

## 2017-01-08 ENCOUNTER — Other Ambulatory Visit: Payer: Self-pay | Admitting: Physician Assistant

## 2017-01-08 DIAGNOSIS — K219 Gastro-esophageal reflux disease without esophagitis: Secondary | ICD-10-CM

## 2017-01-08 NOTE — Telephone Encounter (Signed)
LOV 11/28/2016. Renaldo Fiddler, CMA

## 2017-03-02 ENCOUNTER — Other Ambulatory Visit: Payer: Self-pay | Admitting: Family Medicine

## 2017-03-02 DIAGNOSIS — G4700 Insomnia, unspecified: Secondary | ICD-10-CM

## 2017-03-04 NOTE — Telephone Encounter (Signed)
See refill request.

## 2017-03-04 NOTE — Telephone Encounter (Signed)
Called into [facility]

## 2017-05-15 ENCOUNTER — Other Ambulatory Visit: Payer: Self-pay | Admitting: Physician Assistant

## 2017-05-15 DIAGNOSIS — F32A Depression, unspecified: Secondary | ICD-10-CM

## 2017-05-15 DIAGNOSIS — F329 Major depressive disorder, single episode, unspecified: Secondary | ICD-10-CM

## 2017-05-15 DIAGNOSIS — I10 Essential (primary) hypertension: Secondary | ICD-10-CM

## 2017-05-15 MED ORDER — DILTIAZEM HCL ER COATED BEADS 180 MG PO CP24: ORAL_CAPSULE | ORAL | 1 refills | Status: DC

## 2017-05-15 NOTE — Telephone Encounter (Signed)
[facility] faxed a request on the following medication. Thanks CC  CARTIA XT 180 MG 24 hr capsule  Take 2 capsules by mouth once daily.

## 2017-05-15 NOTE — Telephone Encounter (Signed)
Last ov  11/28/16 Last filled 04/17/16

## 2017-05-15 NOTE — Telephone Encounter (Signed)
Last ov  11/28/16 Last filled 04/30/16

## 2017-05-17 ENCOUNTER — Telehealth: Payer: Self-pay | Admitting: Physician Assistant

## 2017-05-17 NOTE — Telephone Encounter (Signed)
PA pending.  Thanks,  -Rifky Lapre 

## 2017-05-17 NOTE — Telephone Encounter (Signed)
Insurance not covering cymbalta 30mg  TID as she has been taking. We can see if we can get a PA for increased quantity. If denied may have to send in a 30mg  and a 60 mg.

## 2017-05-21 NOTE — Telephone Encounter (Signed)
PA approved.  Thanks,  -Joseline

## 2017-06-06 ENCOUNTER — Other Ambulatory Visit: Payer: Self-pay | Admitting: Physician Assistant

## 2017-06-06 DIAGNOSIS — I10 Essential (primary) hypertension: Secondary | ICD-10-CM

## 2017-09-10 ENCOUNTER — Other Ambulatory Visit: Payer: Self-pay | Admitting: Physician Assistant

## 2017-09-10 DIAGNOSIS — G4700 Insomnia, unspecified: Secondary | ICD-10-CM

## 2017-09-11 NOTE — Telephone Encounter (Signed)
Prescription was phoned in to [place].  Thanks,  -Siana Panameno

## 2018-01-02 ENCOUNTER — Ambulatory Visit
Admission: RE | Admit: 2018-01-02 | Discharge: 2018-01-02 | Disposition: A | Discharge status: Home / Self Care | Payer: 59 | Source: Ambulatory Visit | Attending: Physician Assistant | Admitting: Physician Assistant

## 2018-01-02 ENCOUNTER — Encounter: Payer: Self-pay | Admitting: Physician Assistant

## 2018-01-02 ENCOUNTER — Ambulatory Visit: Payer: 59 | Admitting: Physician Assistant

## 2018-01-02 ENCOUNTER — Telehealth: Payer: Self-pay

## 2018-01-02 VITALS — BP 122/80 | HR 78 | Temp 98.1°F | Resp 16 | Ht 66.0 in | Wt 311.0 lb

## 2018-01-02 DIAGNOSIS — L0291 Cutaneous abscess, unspecified: Secondary | ICD-10-CM

## 2018-01-02 DIAGNOSIS — M545 Low back pain, unspecified: Secondary | ICD-10-CM

## 2018-01-02 DIAGNOSIS — M533 Sacrococcygeal disorders, not elsewhere classified: Secondary | ICD-10-CM

## 2018-01-02 DIAGNOSIS — M5137 Other intervertebral disc degeneration, lumbosacral region: Secondary | ICD-10-CM | POA: Diagnosis not present

## 2018-01-02 DIAGNOSIS — G8929 Other chronic pain: Secondary | ICD-10-CM | POA: Diagnosis present

## 2018-01-02 DIAGNOSIS — M7061 Trochanteric bursitis, right hip: Secondary | ICD-10-CM | POA: Diagnosis present

## 2018-01-02 DIAGNOSIS — Z6841 Body Mass Index (BMI) 40.0 and over, adult: Secondary | ICD-10-CM

## 2018-01-02 MED ORDER — CEPHALEXIN 500 MG PO TABS: 500.0000 mg | ORAL_TABLET | Freq: Three times a day (TID) | ORAL | 0 refills | Status: DC

## 2018-01-02 MED ORDER — PHENTERMINE HCL 37.5 MG PO TABS: 37.5000 mg | ORAL_TABLET | Freq: Every day | ORAL | 0 refills | Status: DC

## 2018-01-02 NOTE — Patient Instructions (Signed)
Low Back Strain Rehab Ask your health care provider which exercises are safe for you. Do exercises exactly as told by your health care provider and adjust them as directed. It is normal to feel mild stretching, pulling, tightness, or discomfort as you do these exercises, but you should stop right away if you feel sudden pain or your pain gets worse. Do not begin these exercises until told by your health care provider. Stretching and range of motion exercises These exercises warm up your muscles and joints and improve the movement and flexibility of your back. These exercises also help to relieve pain, numbness, and tingling. Exercise A: Single knee to chest  1. Lie on your back on a firm surface with both legs straight. 2. Bend one of your knees. Use your hands to move your knee up toward your chest until you feel a gentle stretch in your lower back and buttock. ? Hold your leg in this position by holding onto the front of your knee. ? Keep your other leg as straight as possible. 3. Hold for __________ seconds. 4. Slowly return to the starting position. 5. Repeat with your other leg. Repeat __________ times. Complete this exercise __________ times a day. Exercise B: Prone extension on elbows  1. Lie on your abdomen on a firm surface. 2. Prop yourself up on your elbows. 3. Use your arms to help lift your chest up until you feel a gentle stretch in your abdomen and your lower back. ? This will place some of your body weight on your elbows. If this is uncomfortable, try stacking pillows under your chest. ? Your hips should stay down, against the surface that you are lying on. Keep your hip and back muscles relaxed. 4. Hold for __________ seconds. 5. Slowly relax your upper body and return to the starting position. Repeat __________ times. Complete this exercise __________ times a day. Strengthening exercises These exercises build strength and endurance in your back. Endurance is the ability to  use your muscles for a long time, even after they get tired. Exercise C: Pelvic tilt 1. Lie on your back on a firm surface. Bend your knees and keep your feet flat. 2. Tense your abdominal muscles. Tip your pelvis up toward the ceiling and flatten your lower back into the floor. ? To help with this exercise, you may place a small towel under your lower back and try to push your back into the towel. 3. Hold for __________ seconds. 4. Let your muscles relax completely before you repeat this exercise. Repeat __________ times. Complete this exercise __________ times a day. Exercise D: Alternating arm and leg raises  1. Get on your hands and knees on a firm surface. If you are on a hard floor, you may want to use padding to cushion your knees, such as an exercise mat. 2. Line up your arms and legs. Your hands should be below your shoulders, and your knees should be below your hips. 3. Lift your left leg behind you. At the same time, raise your right arm and straighten it in front of you. ? Do not lift your leg higher than your hip. ? Do not lift your arm higher than your shoulder. ? Keep your abdominal and back muscles tight. ? Keep your hips facing the ground. ? Do not arch your back. ? Keep your balance carefully, and do not hold your breath. 4. Hold for __________ seconds. 5. Slowly return to the starting position and repeat with your right leg  and your left arm. Repeat __________ times. Complete this exercise __________times a day. Exercise J: Single leg lower with bent knees 1. Lie on your back on a firm surface. 2. Tense your abdominal muscles and lift your feet off the floor, one foot at a time, so your knees and hips are bent in an "L" shape (at about 90 degrees). ? Your knees should be over your hips and your lower legs should be parallel to the floor. 3. Keeping your abdominal muscles tense and your knee bent, slowly lower one of your legs so your toe touches the ground. 4. Lift your  leg back up to return to the starting position. ? Do not hold your breath. ? Do not let your back arch. Keep your back flat against the ground. 5. Repeat with your other leg. Repeat __________ times. Complete this exercise __________ times a day. Posture and body mechanics  Body mechanics refers to the movements and positions of your body while you do your daily activities. Posture is part of body mechanics. Good posture and healthy body mechanics can help to relieve stress in your body's tissues and joints. Good posture means that your spine is in its natural S-curve position (your spine is neutral), your shoulders are pulled back slightly, and your head is not tipped forward. The following are general guidelines for applying improved posture and body mechanics to your everyday activities. Standing   When standing, keep your spine neutral and your feet about hip-width apart. Keep a slight bend in your knees. Your ears, shoulders, and hips should line up.  When you do a task in which you stand in one place for a long time, place one foot up on a stable object that is 2-4 inches (5-10 cm) high, such as a footstool. This helps keep your spine neutral. Sitting   When sitting, keep your spine neutral and keep your feet flat on the floor. Use a footrest, if necessary, and keep your thighs parallel to the floor. Avoid rounding your shoulders, and avoid tilting your head forward.  When working at a desk or a computer, keep your desk at a height where your hands are slightly lower than your elbows. Slide your chair under your desk so you are close enough to maintain good posture.  When working at a computer, place your monitor at a height where you are looking straight ahead and you do not have to tilt your head forward or downward to look at the screen. Resting   When lying down and resting, avoid positions that are most painful for you.  If you have pain with activities such as sitting, bending,  stooping, or squatting (flexion-based activities), lie in a position in which your body does not bend very much. For example, avoid curling up on your side with your arms and knees near your chest (fetal position).  If you have pain with activities such as standing for a long time or reaching with your arms (extension-based activities), lie with your spine in a neutral position and bend your knees slightly. Try the following positions: ? Lying on your side with a pillow between your knees. ? Lying on your back with a pillow under your knees. Lifting   When lifting objects, keep your feet at least shoulder-width apart and tighten your abdominal muscles.  Bend your knees and hips and keep your spine neutral. It is important to lift using the strength of your legs, not your back. Do not lock your knees straight  out.  Always ask for help to lift heavy or awkward objects. This information is not intended to replace advice given to you by your health care provider. Make sure you discuss any questions you have with your health care provider. Document Released: 12/17/2005 Document Revised: 08/23/2016 Document Reviewed: 09/28/2015 Elsevier Interactive Patient Education  [address] Injury The tailbone (coccyx) is the small bone at the lower end of the spine. A tailbone injury may involve stretched ligaments, bruising, or a broken bone (fracture). Tailbone injuries can be painful, and some may take a long time to heal. What are the causes? This condition is often caused by falling and landing on the tailbone. Other causes include:  Repeated strain or friction from actions such as rowing and bicycling.  Childbirth.  In some cases, the cause may not be known. What increases the risk? This condition is more common in women than in men. What are the signs or symptoms? Symptoms of this condition include:  Pain in the lower back, especially when sitting.  Pain or difficulty when  standing up from a sitting position.  Bruising in the tailbone area.  Painful bowel movements.  In women, pain during intercourse.  How is this diagnosed? This condition may be diagnosed based on your symptoms and a physical exam. X-rays may be taken if a fracture is suspected. You may also have other tests, such as a CT scan or MRI. How is this treated? This condition may be treated with medicines to help relieve your pain. Most tailbone injuries heal on their own in 4-6 weeks. However, recovery time may be longer if the injury involves a fracture. Follow these instructions at home:  Take medicines only as directed by your health care provider.  If directed, apply ice to the injured area: ? Put ice in a plastic bag. ? Place a towel between your skin and the bag. ? Leave the ice on for 20 minutes, 2-3 times per day for the first 1-2 days.  Sit on a large, rubber or inflated ring or cushion to ease your pain. Lean forward when you are sitting to help decrease discomfort.  Avoid sitting for long periods of time.  Increase your activity as the pain allows. Perform any exercises that are recommended by your health care provider or physical therapist.  If you have pain during bowel movements, use stool softeners as directed by your health care provider.  Eat a diet that includes plenty of fiber to help prevent constipation.  Keep all follow-up visits as directed by your health care provider. This is important. How is this prevented? Wear appropriate padding and sports gear when bicycling and rowing. This can help to prevent developing an injury that is caused by repeated strain or friction. Contact a health care provider if:  Your pain becomes worse.  Your bowel movements cause a great deal of discomfort.  You are unable to have a bowel movement.  You have uncontrolled urine loss (urinary incontinence).  You have a fever. This information is not intended to replace advice  given to you by your health care provider. Make sure you discuss any questions you have with your health care provider. Document Released: 12/14/2000 Document Revised: 08/16/2016 Document Reviewed: 12/13/2014 Elsevier Interactive Patient Education  2018 [place]. Hip Bursitis Hip bursitis is inflammation of a fluid-filled sac (bursa) in the hip joint. The bursa protects the bones in the hip joint from rubbing against each other. Hip bursitis can cause mild to moderate  pain, and symptoms often come and go over time. What are the causes? This condition may be caused by:  Injury to the hip.  Overuse of the muscles that surround the hip joint.  Arthritis or gout.  Diabetes.  Thyroid disease.  Cold weather.  Infection.  In some cases, the cause may not be known. What are the signs or symptoms? Symptoms of this condition may include:  Mild or moderate pain in the hip area. Pain may get worse with movement.  Tenderness and swelling of the hip, especially on the outer side of the hip.  Symptoms may come and go. If the bursa becomes infected, you may have the following symptoms:  Fever.  Red skin and a feeling of warmth in the hip area.  How is this diagnosed? This condition may be diagnosed based on:  A physical exam.  Your medical history.  X-rays.  Removal of fluid from your inflamed bursa for testing (biopsy).  You may be sent to a health care provider who specializes in bone diseases (orthopedist) or a provider who specializes in joint inflammation (rheumatologist). How is this treated? This condition is treated by resting, raising (elevating), and applying pressure(compression) to the injured area. In some cases, this may be enough to make your symptoms go away. Treatment may also include:  Crutches.  Antibiotic medicine.  Draining fluid out of the bursa to help relieve swelling.  Injecting medicine that helps to reduce inflammation (cortisone).  Follow  these instructions at home: Medicines  Take over-the-counter and prescription medicines only as told by your health care provider.  Do not drive or operate heavy machinery while taking prescription pain medicine, or as told by your health care provider.  If you were prescribed an antibiotic, take it as told by your health care provider. Do not stop taking the antibiotic even if you start to feel better. Activity  Return to your normal activities as told by your health care provider. Ask your health care provider what activities are safe for you.  Rest and protect your hip as much as possible until your pain and swelling get better. General instructions  Wear compression wraps only as told by your health care provider.  Elevate your hip above the level of your heart as much as you can without pain. To do this, try putting a pillow under your hips while you lie down.  Do not use your hip to support your body weight until your health care provider says that you can. Use crutches as told by your health care provider.  Gently massage and stretch your injured area as often as is comfortable.  Keep all follow-up visits as told by your health care provider. This is important. How is this prevented?  Exercise regularly, as told by your health care provider.  Warm up and stretch before being active.  Cool down and stretch after being active.  If an activity irritates your hip or causes pain, avoid the activity as much as possible.  Avoid sitting down for long periods at a time. Contact a health care provider if:  You have a fever.  You develop new symptoms.  You have difficulty walking or doing everyday activities.  You have pain that gets worse or does not get better with medicine.  You develop red skin or a feeling of warmth in your hip area. Get help right away if:  You cannot move your hip.  You have severe pain. This information is not intended to replace advice  given to  you by your health care provider. Make sure you discuss any questions you have with your health care provider. Document Released: 06/08/2002 Document Revised: 05/24/2016 Document Reviewed: 07/19/2015 Elsevier Interactive Patient Education  [employer].

## 2018-01-02 NOTE — Telephone Encounter (Signed)
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 If one of these disc may be causing nerve root compression it could cause some of the symptoms of the right hip. Still recommend the conservative management discussed and f/u if no improvements.

## 2018-01-02 NOTE — Telephone Encounter (Signed)
Patient advised as below.  

## 2018-01-02 NOTE — Progress Notes (Signed)
Patient: Patricia Deleon Female    DOB: 1961-03-30   57 y.o.   MRN: [id number] Visit Date: 01/02/2018  Today's Provider: Mar Daring, PA-C   Chief Complaint  Patient presents with  . Rash  . Back Pain   Subjective:    HPI Patient here today C/O "spot" on stomach  X's 5 days. Patient reports spots looks like a first degree burn, spot is leaking clear discharge, pt reports pain with touch and movement. Patient denies any fever or green discharge.  Patient also C/O back pain times a year. Patient reports that she did fall and fell on her but first about a year ago at the stairs at home. Patient reports pain is worse with sitting. Patient reports pain is present on coccyx and radiated to right hip. Patient reports she has been using Tylenol and Aleve and reports mild pain improvement.  Patient reports she has been under a lot of stress due to family problems and has not been able to take a few of her medications.    No Known Allergies   Current Outpatient Medications:  .  ACTICLATE 150 MG TABS, 1 tablet by mouth daily; take with food, Disp: , Rfl: 2 .  Cholecalciferol (VITAMIN D3) 2000 UNITS capsule, Take by mouth., Disp: , Rfl:  .  DEXILANT 60 MG capsule, TAKE ONE CAPSULE BY MOUTH ONCE DAILY, Disp: 90 capsule, Rfl: 3 .  hydrochlorothiazide (MICROZIDE) 12.5 MG capsule, TAKE ONE CAPSULE BY MOUTH ONCE DAILY, Disp: 90 capsule, Rfl: 3 .  levothyroxine (SYNTHROID, LEVOTHROID) 150 MCG tablet, TAKE ONE TABLET BY MOUTH ONCE DAILY BEFORE BREAKFAST, Disp: 90 tablet, Rfl: 3 .  temazepam (RESTORIL) 30 MG capsule, TAKE 1 CAPSULE BY MOUTH AT BEDTIME AS NEEDED FOR SLEEP, Disp: 30 capsule, Rfl: 5 .  diltiazem (CARTIA XT) 180 MG 24 hr capsule, TAKE TWO CAPSULES BY MOUTH ONCE DAILY (Patient not taking: Reported on 01/02/2018), Disp: 180 capsule, Rfl: 1 .  DULoxetine (CYMBALTA) 30 MG capsule, TAKE THREE CAPSULES BY MOUTH ONCE DAILY (Patient not taking: Reported on 01/02/2018), Disp: 360  capsule, Rfl: 1 .  Glucosamine Sulfate-MSM (MSM/GLUCOSAMINE) 250-250 MG CAPS, Take by mouth., Disp: , Rfl:  .  MEGARED OMEGA-3 KRILL OIL PO, Take by mouth daily., Disp: , Rfl:  .  sucralfate (CARAFATE) 1 g tablet, Take 1 tablet (1 g total) by mouth 2 (two) times daily., Disp: 60 tablet, Rfl: 5  Review of Systems  Constitutional: Negative.   HENT: Negative.   Respiratory: Negative.   Cardiovascular: Negative.   Gastrointestinal: Negative.   Musculoskeletal: Positive for back pain.  Skin: Positive for wound.  Neurological: Negative.     Social History   Tobacco Use  . Smoking status: Never Smoker  . Smokeless tobacco: Never Used  Substance Use Topics  . Alcohol use: No    Alcohol/week: 0.0 oz   Objective:   BP 122/80 (BP Location: Left Arm, Patient Position: Sitting, Cuff Size: Large)   Pulse 78   Temp 98.1 F (36.7 C) (Oral)   Resp 16   Ht 5\' 6"  (1.676 m)   Wt (!) 311 lb (141.1 kg)   SpO2 97%   BMI 50.20 kg/m  Vitals:   01/02/18 0815  BP: 122/80  Pulse: 78  Resp: 16  Temp: 98.1 F (36.7 C)  TempSrc: Oral  SpO2: 97%  Weight: (!) 311 lb (141.1 kg)  Height: 5\' 6"  (1.676 m)     Physical Exam  Constitutional: She  appears well-developed and well-nourished. No distress.  Neck: Normal range of motion. Neck supple. No tracheal deviation present. No thyromegaly present.  Cardiovascular: Normal rate, regular rhythm and normal heart sounds. Exam reveals no gallop and no friction rub.  No murmur heard. Pulmonary/Chest: Effort normal and breath sounds normal. No respiratory distress. She has no wheezes. She has no rales.  Musculoskeletal:       Right hip: She exhibits decreased range of motion and bony tenderness (over right greater trochanter). She exhibits normal strength and no tenderness.       Lumbar back: She exhibits decreased range of motion and tenderness. She exhibits no bony tenderness, no pain and no spasm.  Negative SLR  Lymphadenopathy:    She has no  cervical adenopathy.  Skin: She is not diaphoretic.     Vitals reviewed.       Assessment & Plan:     1. Abscess Will treat with keflex as below for wound on abdomen. May apply warm compress. Call if symptoms worsen. - Cephalexin 500 MG tablet; Take 1 tablet (500 mg total) by mouth 3 (three) times daily.  Dispense: 15 tablet; Refill: 0  2. Coccyalgia Fell 08/2016. No previous imaging. Has had intermittent pain since. Will get imaging as below. Discussed using Tumeric for inflammation. Warm compresses and epsom salt soaks prn. I will see her back in 2-4 weeks for f/u and CPE.  - DG Sacrum/Coccyx; Future  3. Trochanteric bursitis of right hip Worsening over last few months. Cannot lie on right hip. Recommend conservative management as noted above and may consider PT in the future if no response.  - DG Lumbar Spine Complete; Future  4. Chronic midline low back pain without sciatica See above medical treatment plan. - DG Lumbar Spine Complete; Future  5. Class 3 severe obesity due to excess calories with serious comorbidity and body mass index (BMI) of 50.0 to 59.9 in adult [facility]) Will restart phentermine as below for weight loss. I will see her back in 4 weeks for her CPE and weight recheck. - phentermine (ADIPEX-P) 37.5 MG tablet; Take 1 tablet (37.5 mg total) by mouth daily before breakfast.  Dispense: 30 tablet; Refill: 0       Mar Daring, PA-C  [facility]

## 2018-01-02 NOTE — Telephone Encounter (Signed)
-----   Message from Mar Daring, PA-C sent at 01/02/2018 11:17 AM EST ----- Sacral and coccyx xrays are essentially normal.

## 2018-01-20 ENCOUNTER — Other Ambulatory Visit: Payer: Self-pay | Admitting: Physician Assistant

## 2018-01-20 DIAGNOSIS — K219 Gastro-esophageal reflux disease without esophagitis: Secondary | ICD-10-CM

## 2018-02-03 ENCOUNTER — Ambulatory Visit (INDEPENDENT_AMBULATORY_CARE_PROVIDER_SITE_OTHER): Payer: 59 | Admitting: Physician Assistant

## 2018-02-03 ENCOUNTER — Encounter: Payer: Self-pay | Admitting: Physician Assistant

## 2018-02-03 VITALS — BP 120/90 | HR 80 | Temp 97.9°F | Resp 16 | Ht 66.0 in | Wt 294.8 lb

## 2018-02-03 DIAGNOSIS — E039 Hypothyroidism, unspecified: Secondary | ICD-10-CM

## 2018-02-03 DIAGNOSIS — Z1159 Encounter for screening for other viral diseases: Secondary | ICD-10-CM | POA: Diagnosis not present

## 2018-02-03 DIAGNOSIS — Z1211 Encounter for screening for malignant neoplasm of colon: Secondary | ICD-10-CM

## 2018-02-03 DIAGNOSIS — E7800 Pure hypercholesterolemia, unspecified: Secondary | ICD-10-CM

## 2018-02-03 DIAGNOSIS — Z1231 Encounter for screening mammogram for malignant neoplasm of breast: Secondary | ICD-10-CM

## 2018-02-03 DIAGNOSIS — Z6841 Body Mass Index (BMI) 40.0 and over, adult: Secondary | ICD-10-CM

## 2018-02-03 DIAGNOSIS — I10 Essential (primary) hypertension: Secondary | ICD-10-CM | POA: Diagnosis not present

## 2018-02-03 DIAGNOSIS — K2270 Barrett's esophagus without dysplasia: Secondary | ICD-10-CM

## 2018-02-03 DIAGNOSIS — Z0000 Encounter for general adult medical examination without abnormal findings: Secondary | ICD-10-CM | POA: Diagnosis not present

## 2018-02-03 DIAGNOSIS — R7303 Prediabetes: Secondary | ICD-10-CM | POA: Diagnosis not present

## 2018-02-03 DIAGNOSIS — Z114 Encounter for screening for human immunodeficiency virus [HIV]: Secondary | ICD-10-CM

## 2018-02-03 DIAGNOSIS — E559 Vitamin D deficiency, unspecified: Secondary | ICD-10-CM | POA: Diagnosis not present

## 2018-02-03 DIAGNOSIS — Z1239 Encounter for other screening for malignant neoplasm of breast: Secondary | ICD-10-CM

## 2018-02-03 MED ORDER — PHENTERMINE HCL 37.5 MG PO TABS: 37.5000 mg | ORAL_TABLET | Freq: Every day | ORAL | 2 refills | Status: DC

## 2018-02-03 NOTE — Progress Notes (Signed)
Patient: Patricia Deleon, Female    DOB: November 01, 1961, 57 y.o.   MRN: [id number] Visit Date: 02/03/2018  Today's Provider: Mar Daring, PA-C   Chief Complaint  Patient presents with  . Annual Exam   Subjective:    Annual physical exam Patricia Deleon is a 57 y.o. female who presents today for health maintenance and complete physical. She feels well. She reports exercising walking. She reports she is sleeping fairly well. 04/22/15 Mammogram-BI-RADS 1 -----------------------------------------------------------------  Review of Systems  Constitutional: Positive for activity change, diaphoresis and fatigue.  HENT: Positive for tinnitus.   Eyes: Negative.   Respiratory: Positive for apnea and cough.   Cardiovascular: Negative.   Gastrointestinal: Positive for abdominal distention and constipation.  Endocrine: Positive for heat intolerance and polydipsia.  Genitourinary: Positive for flank pain.  Musculoskeletal: Positive for back pain.  Skin: Negative.   Allergic/Immunologic: Negative.   Neurological: Positive for dizziness.  Hematological: Negative.   Psychiatric/Behavioral: Negative.     Social History      She  reports that  has never smoked. she has never used smokeless tobacco. She reports that she does not drink alcohol or use drugs.       Social History   Socioeconomic History  . Marital status: Married    Spouse name: None  . Number of children: None  . Years of education: None  . Highest education level: None  Social Needs  . Financial resource strain: None  . Food insecurity - worry: None  . Food insecurity - inability: None  . Transportation needs - medical: None  . Transportation needs - non-medical: None  Occupational History  . None  Tobacco Use  . Smoking status: Never Smoker  . Smokeless tobacco: Never Used  Substance and Sexual Activity  . Alcohol use: No    Alcohol/week: 0.0 oz  . Drug use: No  . Sexual activity: None    Other Topics Concern  . None  Social History Narrative  . None    Past Medical History:  Diagnosis Date  . Agoraphobia with panic disorder   . Anemia   . Depression   . Geographic tongue   . GERD (gastroesophageal reflux disease)   . History of esophagogastroduodenoscopy (EGD)   . Hypercholesterolemia   . Hypertension   . Intervertebral disc disorder    thoracic,thoracolumber,lumbosacral  . Obesity   . Sleep apnea      Patient Active Problem List   Diagnosis Date Noted  . Acne rosacea 05/07/2016  . Agoraphobia with panic disorder 09/28/2015  . Clinical depression 09/28/2015  . 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 Acid reflux 09/28/2015  . Displacement of lumbar intervertebral disc without myelopathy 09/28/2015  . Hypercholesteremia 09/28/2015  . Insomnia 09/28/2015  . Thoracic, 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  . Anemia, iron deficiency 09/28/2015  . Adiposity 09/28/2015  . Episodic paroxysmal anxiety disorder 09/28/2015  . Borderline diabetes 09/28/2015  . Restless leg 09/28/2015  . Apnea, sleep 09/28/2015  . Avitaminosis D 09/28/2015  . Hypothyroidism 07/11/2015  . Hypertension 06/09/2015    Past Surgical History:  Procedure Laterality Date  . COLONOSCOPY    . ESOPHAGOGASTRODUODENOSCOPY (EGD) WITH PROPOFOL N/A 01/31/2016   Procedure: ESOPHAGOGASTRODUODENOSCOPY (EGD) WITH PROPOFOL;  Surgeon: Lollie Sails, MD;  Location: [facility] ENDOSCOPY;  Service: Endoscopy;  Laterality: N/A;  . NISSEN FUNDOPLICATION      Family History        Family Status  Relation Name Status  .  Mother  Deceased  . Father  Deceased  . Brother ##Brother1 Alive  . Son ##Son1 Alive  . MGF  Deceased  . PGM  Deceased        Her family history includes Alcohol abuse in her brother, father, and mother; Anxiety disorder in her mother; Arthritis in her father; Autism in her son; CVA in her paternal grandmother; Cancer in her maternal grandmother; Coronary  artery disease in her maternal grandfather; Esophageal cancer in her father; Heart attack in her maternal grandfather; Hypertension in her brother, father, and mother; Lung cancer in her mother; Skin cancer in her paternal grandmother.     No Known Allergies   Current Outpatient Medications:  .  Cholecalciferol (VITAMIN D3) 2000 UNITS capsule, Take by mouth., Disp: , Rfl:  .  DEXILANT 60 MG capsule, TAKE ONE CAPSULE BY MOUTH ONCE DAILY, Disp: 30 capsule, Rfl: 11 .  DULoxetine (CYMBALTA) 30 MG capsule, TAKE THREE CAPSULES BY MOUTH ONCE DAILY, Disp: 360 capsule, Rfl: 1 .  levothyroxine (SYNTHROID, LEVOTHROID) 150 MCG tablet, TAKE ONE TABLET BY MOUTH ONCE DAILY BEFORE BREAKFAST, Disp: 90 tablet, Rfl: 3 .  phentermine (ADIPEX-P) 37.5 MG tablet, Take 1 tablet (37.5 mg total) by mouth daily before breakfast., Disp: 30 tablet, Rfl: 0 .  temazepam (RESTORIL) 30 MG capsule, TAKE 1 CAPSULE BY MOUTH AT BEDTIME AS NEEDED FOR SLEEP, Disp: 30 capsule, Rfl: 5 .  ACTICLATE 150 MG TABS, 1 tablet by mouth daily; take with food, Disp: , Rfl: 2 .  Glucosamine Sulfate-MSM (MSM/GLUCOSAMINE) 250-250 MG CAPS, Take by mouth., Disp: , Rfl:  .  sucralfate (CARAFATE) 1 g tablet, Take 1 tablet (1 g total) by mouth 2 (two) times daily., Disp: 60 tablet, Rfl: 5   Patient Care Team: Patient, No Pcp Per as PCP - General (General Practice)      Objective:   Vitals: BP 120/90 (BP Location: Left Arm, Patient Position: Sitting, Cuff Size: Large)   Pulse 80   Temp 97.9 F (36.6 C) (Oral)   Resp 16   Ht 5\' 6"  (1.676 m)   Wt 294 lb 12.8 oz (133.7 kg)   LMP 02/01/2018 (Exact Date)   SpO2 96%   BMI 47.58 kg/m    Vitals:   02/03/18 1423  BP: 120/90  Pulse: 80  Resp: 16  Temp: 97.9 F (36.6 C)  TempSrc: Oral  SpO2: 96%  Weight: 294 lb 12.8 oz (133.7 kg)  Height: 5\' 6"  (1.676 m)     Physical Exam  Constitutional: She is oriented to person, place, and time. She appears well-developed and well-nourished. No  distress.  HENT:  Head: Normocephalic and atraumatic.  Right Ear: Hearing, tympanic membrane, external ear and ear canal normal.  Left Ear: Hearing, tympanic membrane, external ear and ear canal normal.  Nose: Nose normal.  Mouth/Throat: Uvula is midline, oropharynx is clear and moist and mucous membranes are normal. No oropharyngeal exudate.  Eyes: Conjunctivae and EOM are normal. Pupils are equal, round, and reactive to light. Right eye exhibits no discharge. Left eye exhibits no discharge. No scleral icterus.  Neck: Normal range of motion. Neck supple. No JVD present. No tracheal deviation present. No thyromegaly present.  Cardiovascular: Normal rate, regular rhythm, normal heart sounds and intact distal pulses. Exam reveals no gallop and no friction rub.  No murmur heard. Pulmonary/Chest: Effort normal and breath sounds normal. No respiratory distress. She has no wheezes. She has no rales. She exhibits no tenderness. Right breast exhibits no inverted nipple, no  mass, no nipple discharge, no skin change and no tenderness. Left breast exhibits no inverted nipple, no mass, no nipple discharge, no skin change and no tenderness. Breasts are symmetrical.  Abdominal: Soft. Bowel sounds are normal. She exhibits no distension and no mass. There is no tenderness. There is no rebound and no guarding.  Musculoskeletal: Normal range of motion. She exhibits no edema or tenderness.  Lymphadenopathy:    She has no cervical adenopathy.  Neurological: She is alert and oriented to person, place, and time.  Skin: Skin is warm and dry. No rash noted. She is not diaphoretic.  Psychiatric: She has a normal mood and affect. Her behavior is normal. Judgment and thought content normal.  Vitals reviewed.    Depression Screen PHQ 2/9 Scores 02/03/2018 01/02/2018  PHQ - 2 Score 2 2  PHQ- 9 Score 7 7      Assessment & Plan:     Routine Health Maintenance and Physical Exam  Exercise Activities and Dietary  recommendations Goals    None      Immunization History  Administered Date(s) Administered  . Influenza Split 09/21/2011  . Influenza,inj,Quad PF,6+ Mos 10/12/2015  . Influenza-Unspecified 10/16/2016  . Tdap 02/21/2011    Health Maintenance  Topic Date Due  . Hepatitis C Screening  1961-08-14  . HIV Screening  09/14/1978  . PAP SMEAR  09/14/1984  . MAMMOGRAM  04/21/2017  . COLONOSCOPY  09/01/2018  . TETANUS/TDAP  02/21/2021  . INFLUENZA VACCINE  Completed     Discussed health benefits of physical activity, and encouraged her to engage in regular exercise appropriate for her age and condition.    1. Annual physical exam Normal physical exam today. Will check labs as below and f/u pending lab results. If labs are stable and WNL she will not need to have these rechecked for one year at her next annual physical exam. She is to call the office in the meantime if she has any acute issue, questions or concerns.  2. Screening for breast cancer Breast exam today was normal. There is no family history of breast cancer. She does perform regular self breast exams. Mammogram was ordered as below. Information for [facility] clinic was given to patient so she may schedule her mammogram at her convenience. - MM DIGITAL SCREENING BILATERAL; Future  3. Colon cancer screening Referral placed for colonoscopy. - Ambulatory referral to Gastroenterology  4. Essential hypertension Diet controlled. Will check labs as below and f/u pending results. - CBC with Differential/Platelet - Comprehensive metabolic panel  5. Hypothyroidism, unspecified type Stable on levothyroxine 120mcg. Will check labs as below and f/u pending results. - TSH  6. Hypercholesteremia Diet controlled. Will check labs as below and f/u pending results. - Lipid Panel With LDL/HDL Ratio  7. Borderline diabetes Diet controlled. Will check labs as below and f/u pending results. - Hemoglobin A1c  8. Avitaminosis  D H/O this and on Vit D3 supplement 2000 IU daily. Will check labs as below and f/u pending results. - VITAMIN D 25 Hydroxy (Vit-D Deficiency, Fractures)  9. Need for hepatitis C screening test - Hepatitis C Antibody  10. Screening for HIV without presence of risk factors - HIV antibody (with reflex)  11. Barrett's esophagus without dysplasia Patient due for repeat EGD.  - Ambulatory referral to Gastroenterology  12. Class 3 severe obesity due to excess calories with serious comorbidity and body mass index (BMI) of 50.0 to 59.9 in adult [facility]) Counseled patient on healthy lifestyle modifications including  dieting and exercise. Will start phentermine as below. I will see her back for weight recheck in 3 months.  - phentermine (ADIPEX-P) 37.5 MG tablet; Take 1 tablet (37.5 mg total) by mouth daily before breakfast.  Dispense: 30 tablet; Refill: 2  --------------------------------------------------------------------    Mar Daring, PA-C  [facility]

## 2018-02-03 NOTE — Patient Instructions (Signed)

## 2018-02-04 LAB — CBC WITH DIFFERENTIAL/PLATELET
BASOS: 1 %
Basophils Absolute: 0 10*3/uL (ref 0.0–0.2)
EOS (ABSOLUTE): 0 10*3/uL (ref 0.0–0.4)
Eos: 1 %
HEMATOCRIT: 40 % (ref 34.0–46.6)
HEMOGLOBIN: 12.5 g/dL (ref 11.1–15.9)
IMMATURE GRANS (ABS): 0 10*3/uL (ref 0.0–0.1)
IMMATURE GRANULOCYTES: 0 %
LYMPHS: 29 %
Lymphocytes Absolute: 1.8 10*3/uL (ref 0.7–3.1)
MCH: 24.1 pg — AB (ref 26.6–33.0)
MCHC: 31.3 g/dL — ABNORMAL LOW (ref 31.5–35.7)
MCV: 77 fL — AB (ref 79–97)
MONOCYTES: 4 %
Monocytes Absolute: 0.2 10*3/uL (ref 0.1–0.9)
NEUTROS ABS: 4.1 10*3/uL (ref 1.4–7.0)
NEUTROS PCT: 65 %
PLATELETS: 285 10*3/uL (ref 150–379)
RBC: 5.18 x10E6/uL (ref 3.77–5.28)
RDW: 17.6 % — ABNORMAL HIGH (ref 12.3–15.4)
WBC: 6.3 10*3/uL (ref 3.4–10.8)

## 2018-02-04 LAB — COMPREHENSIVE METABOLIC PANEL
A/G RATIO: 1.5 (ref 1.2–2.2)
ALBUMIN: 4.5 g/dL (ref 3.5–5.5)
ALT: 18 IU/L (ref 0–32)
AST: 16 IU/L (ref 0–40)
Alkaline Phosphatase: 79 IU/L (ref 39–117)
BUN / CREAT RATIO: 19 (ref 9–23)
BUN: 19 mg/dL (ref 6–24)
CALCIUM: 9.5 mg/dL (ref 8.7–10.2)
CO2: 20 mmol/L (ref 20–29)
Chloride: 103 mmol/L (ref 96–106)
Creatinine, Ser: 1 mg/dL (ref 0.57–1.00)
GFR calc Af Amer: 74 mL/min/{1.73_m2} (ref >59)
GFR, EST NON AFRICAN AMERICAN: 64 mL/min/{1.73_m2} (ref >59)
GLOBULIN, TOTAL: 3.1 g/dL (ref 1.5–4.5)
Glucose: 84 mg/dL (ref 65–99)
POTASSIUM: 4.3 mmol/L (ref 3.5–5.2)
Sodium: 139 mmol/L (ref 134–144)
TOTAL PROTEIN: 7.6 g/dL (ref 6.0–8.5)

## 2018-02-04 LAB — LIPID PANEL WITH LDL/HDL RATIO
Cholesterol, Total: 197 mg/dL (ref 100–199)
HDL: 41 mg/dL (ref >39)
LDL Calculated: 124 mg/dL — ABNORMAL HIGH (ref 0–99)
LDl/HDL Ratio: 3 ratio (ref 0.0–3.2)
Triglycerides: 161 mg/dL — ABNORMAL HIGH (ref 0–149)
VLDL Cholesterol Cal: 32 mg/dL (ref 5–40)

## 2018-02-04 LAB — HEMOGLOBIN A1C
Est. average glucose Bld gHb Est-mCnc: 117 mg/dL
Hgb A1c MFr Bld: 5.7 % — ABNORMAL HIGH (ref 4.8–5.6)

## 2018-02-04 LAB — TSH: TSH: 2.46 u[IU]/mL (ref 0.450–4.500)

## 2018-02-04 LAB — HIV ANTIBODY (ROUTINE TESTING W REFLEX): HIV Screen 4th Generation wRfx: NONREACTIVE

## 2018-02-04 LAB — VITAMIN D 25 HYDROXY (VIT D DEFICIENCY, FRACTURES): VIT D 25 HYDROXY: 25.5 ng/mL — AB (ref 30.0–100.0)

## 2018-02-04 LAB — HEPATITIS C ANTIBODY

## 2018-02-05 ENCOUNTER — Telehealth: Payer: Self-pay

## 2018-02-05 NOTE — Telephone Encounter (Signed)
-----   Message from Mar Daring, PA-C sent at 02/04/2018  8:27 PM EST ----- Cholesterol improved from previous. A1c stable at 5.7. Kidney and liver function are normal. Thyroid normal. Vit D borderline low. Recommend continuing OTC Vit D supplement of 2000 IU daily. Hep C negative. HIV testing negative. CBC normal but showing some signs of early iron deficiency. If you can tolerate may benefit from low dose OTC ferrous sulfate (slow release is better on not causing GI side effects).

## 2018-02-05 NOTE — Telephone Encounter (Signed)
Patient advised as directed below.  Thanks,  -Joseline 

## 2018-02-10 ENCOUNTER — Other Ambulatory Visit: Payer: Self-pay | Admitting: Physician Assistant

## 2018-02-10 DIAGNOSIS — E039 Hypothyroidism, unspecified: Secondary | ICD-10-CM

## 2018-02-17 ENCOUNTER — Ambulatory Visit
Admission: RE | Admit: 2018-02-17 | Discharge: 2018-02-17 | Disposition: A | Discharge status: Home / Self Care | Payer: 59 | Source: Ambulatory Visit | Attending: Physician Assistant | Admitting: Physician Assistant

## 2018-02-17 ENCOUNTER — Telehealth: Payer: Self-pay

## 2018-02-17 DIAGNOSIS — Z1239 Encounter for other screening for malignant neoplasm of breast: Secondary | ICD-10-CM

## 2018-02-17 DIAGNOSIS — Z1231 Encounter for screening mammogram for malignant neoplasm of breast: Secondary | ICD-10-CM | POA: Insufficient documentation

## 2018-02-17 NOTE — Telephone Encounter (Signed)
-----   Message from Mar Daring, PA-C sent at 02/17/2018  3:39 PM EST ----- Normal mammogram. Repeat screening in one year.

## 2018-02-17 NOTE — Telephone Encounter (Signed)
Patient advised as directed below.  Thanks,  -Joseline 

## 2018-03-15 ENCOUNTER — Other Ambulatory Visit: Payer: Self-pay | Admitting: Physician Assistant

## 2018-03-15 DIAGNOSIS — G4700 Insomnia, unspecified: Secondary | ICD-10-CM

## 2018-05-05 ENCOUNTER — Telehealth: Payer: Self-pay | Admitting: Physician Assistant

## 2018-05-05 NOTE — Telephone Encounter (Signed)
Pt called wanting to know if she can have her labs faxed to [facility].   They want to put her on Acutane but they need her recent liver and labs before they do that  [facility] Fax # [phone]    Pt's call back is [phone]  Thanks Con Memos

## 2018-05-05 NOTE — Telephone Encounter (Signed)
Ok to fax with verbal ok from patient to share information

## 2018-05-06 NOTE — Telephone Encounter (Signed)
Faxed lab reports.

## 2018-05-12 ENCOUNTER — Telehealth: Payer: Self-pay

## 2018-05-12 DIAGNOSIS — I10 Essential (primary) hypertension: Secondary | ICD-10-CM

## 2018-05-12 NOTE — Telephone Encounter (Signed)
Patient called saying that her BP has been running high. She reports that it is averaging in the 140s/90s. She reports that when she went to Dr. Marton Redwood office last week, her DBP was over 100. She reports that she used to be on BP medication in the past, but can not remember the name. She feels that she may need to start back again. Advised patient that she would probably need to be seen for this, but she wanted me to send a message to let you know. She was also advised that you were out of the office today. Please advise. Thanks!

## 2018-05-13 MED ORDER — AMLODIPINE BESYLATE 5 MG PO TABS: 5.0000 mg | ORAL_TABLET | Freq: Every day | ORAL | 0 refills | Status: DC

## 2018-05-13 NOTE — Telephone Encounter (Signed)
Patient advised as below.  

## 2018-05-13 NOTE — Telephone Encounter (Signed)
Amlodipine sent in. She needs appt in 2-4 weeks to check BP.

## 2018-05-19 ENCOUNTER — Other Ambulatory Visit: Payer: Self-pay | Admitting: Physician Assistant

## 2018-05-19 DIAGNOSIS — F329 Major depressive disorder, single episode, unspecified: Secondary | ICD-10-CM

## 2018-05-19 DIAGNOSIS — E039 Hypothyroidism, unspecified: Secondary | ICD-10-CM

## 2018-05-19 DIAGNOSIS — F32A Depression, unspecified: Secondary | ICD-10-CM

## 2018-06-02 ENCOUNTER — Ambulatory Visit: Payer: 59 | Admitting: Physician Assistant

## 2018-06-02 ENCOUNTER — Encounter: Payer: Self-pay | Admitting: Physician Assistant

## 2018-06-02 VITALS — BP 120/80 | HR 80 | Temp 98.7°F | Resp 16 | Wt 293.0 lb

## 2018-06-02 DIAGNOSIS — M5431 Sciatica, right side: Secondary | ICD-10-CM

## 2018-06-02 DIAGNOSIS — I10 Essential (primary) hypertension: Secondary | ICD-10-CM | POA: Diagnosis not present

## 2018-06-02 MED ORDER — AMLODIPINE BESYLATE 5 MG PO TABS: 5.0000 mg | ORAL_TABLET | Freq: Every day | ORAL | 1 refills | Status: DC

## 2018-06-02 MED ORDER — GABAPENTIN 100 MG PO CAPS: 100.0000 mg | ORAL_CAPSULE | Freq: Two times a day (BID) | ORAL | 1 refills | Status: DC

## 2018-06-02 NOTE — Patient Instructions (Signed)
Amlodipine tablets What is this medicine? AMLODIPINE (am LOE di peen) is a calcium-channel blocker. It affects the amount of calcium found in your heart and muscle cells. This relaxes your blood vessels, which can reduce the amount of work the heart has to do. This medicine is used to lower high blood pressure. It is also used to prevent chest pain. This medicine may be used for other purposes; ask your health care provider or pharmacist if you have questions. COMMON BRAND NAME(S): Norvasc What should I tell my health care provider before I take this medicine? They need to know if you have any of these conditions: -heart problems like heart failure or aortic stenosis -liver disease -an unusual or allergic reaction to amlodipine, other medicines, foods, dyes, or preservatives -pregnant or trying to get pregnant -breast-feeding How should I use this medicine? Take this medicine by mouth with a glass of water. Follow the directions on the prescription label. Take your medicine at regular intervals. Do not take more medicine than directed. Talk to your pediatrician regarding the use of this medicine in children. Special care may be needed. This medicine has been used in children as young as 6. Persons over 26 years old may have a stronger reaction to this medicine and need smaller doses. Overdosage: If you think you have taken too much of this medicine contact a poison control center or emergency room at once. NOTE: This medicine is only for you. Do not share this medicine with others. What if I miss a dose? If you miss a dose, take it as soon as you can. If it is almost time for your next dose, take only that dose. Do not take double or extra doses. What may interact with this medicine? -herbal or dietary supplements -local or general anesthetics -medicines for high blood pressure -medicines for prostate problems -rifampin This list may not describe all possible interactions. Give your health  care provider a list of all the medicines, herbs, non-prescription drugs, or dietary supplements you use. Also tell them if you smoke, drink alcohol, or use illegal drugs. Some items may interact with your medicine. What should I watch for while using this medicine? Visit your doctor or health care professional for regular check ups. Check your blood pressure and pulse rate regularly. Ask your health care professional what your blood pressure and pulse rate should be, and when you should contact him or her. This medicine may make you feel confused, dizzy or lightheaded. Do not drive, use machinery, or do anything that needs mental alertness until you know how this medicine affects you. To reduce the risk of dizzy or fainting spells, do not sit or stand up quickly, especially if you are an older patient. Avoid alcoholic drinks; they can make you more dizzy. Do not suddenly stop taking amlodipine. Ask your doctor or health care professional how you can gradually reduce the dose. What side effects may I notice from receiving this medicine? Side effects that you should report to your doctor or health care professional as soon as possible: -allergic reactions like skin rash, itching or hives, swelling of the face, lips, or tongue -breathing problems -changes in vision or hearing -chest pain -fast, irregular heartbeat -swelling of legs or ankles Side effects that usually do not require medical attention (report to your doctor or health care professional if they continue or are bothersome): -dry mouth -facial flushing -nausea, vomiting -stomach gas, pain -tired, weak -trouble sleeping This list may not describe all possible side  effects. Call your doctor for medical advice about side effects. You may report side effects to FDA at 1-800-FDA-1088. Where should I keep my medicine? Keep out of the reach of children. Store at room temperature between 59 and 86 degrees F (15 and 30 degrees C). Protect from  light. Keep container tightly closed. Throw away any unused medicine after the expiration date. NOTE: This sheet is a summary. It may not cover all possible information. If you have questions about this medicine, talk to your doctor, pharmacist, or health care provider.  2018 Elsevier/Gold Standard (2012-11-14 11:40:58) Gabapentin capsules or tablets What is this medicine? GABAPENTIN (GA ba pen tin) is used to control partial seizures in adults with epilepsy. It is also used to treat certain types of nerve pain. This medicine may be used for other purposes; ask your health care provider or pharmacist if you have questions. COMMON BRAND NAME(S): Active-PAC with Gabapentin, Gabarone, Neurontin What should I tell my health care provider before I take this medicine? They need to know if you have any of these conditions: -kidney disease -suicidal thoughts, plans, or attempt; a previous suicide attempt by you or a family member -an unusual or allergic reaction to gabapentin, other medicines, foods, dyes, or preservatives -pregnant or trying to get pregnant -breast-feeding How should I use this medicine? Take this medicine by mouth with a glass of water. Follow the directions on the prescription label. You can take it with or without food. If it upsets your stomach, take it with food.Take your medicine at regular intervals. Do not take it more often than directed. Do not stop taking except on your doctor's advice. If you are directed to break the 600 or 800 mg tablets in half as part of your dose, the extra half tablet should be used for the next dose. If you have not used the extra half tablet within 28 days, it should be thrown away. A special MedGuide will be given to you by the pharmacist with each prescription and refill. Be sure to read this information carefully each time. Talk to your pediatrician regarding the use of this medicine in children. Special care may be needed. Overdosage: If you  think you have taken too much of this medicine contact a poison control center or emergency room at once. NOTE: This medicine is only for you. Do not share this medicine with others. What if I miss a dose? If you miss a dose, take it as soon as you can. If it is almost time for your next dose, take only that dose. Do not take double or extra doses. What may interact with this medicine? Do not take this medicine with any of the following medications: -other gabapentin products This medicine may also interact with the following medications: -alcohol -antacids -antihistamines for allergy, cough and cold -certain medicines for anxiety or sleep -certain medicines for depression or psychotic disturbances -homatropine; hydrocodone -naproxen -narcotic medicines (opiates) for pain -phenothiazines like chlorpromazine, mesoridazine, prochlorperazine, thioridazine This list may not describe all possible interactions. Give your health care provider a list of all the medicines, herbs, non-prescription drugs, or dietary supplements you use. Also tell them if you smoke, drink alcohol, or use illegal drugs. Some items may interact with your medicine. What should I watch for while using this medicine? Visit your doctor or health care professional for regular checks on your progress. You may want to keep a record at home of how you feel your condition is responding to treatment. You may  want to share this information with your doctor or health care professional at each visit. You should contact your doctor or health care professional if your seizures get worse or if you have any new types of seizures. Do not stop taking this medicine or any of your seizure medicines unless instructed by your doctor or health care professional. Stopping your medicine suddenly can increase your seizures or their severity. Wear a medical identification bracelet or chain if you are taking this medicine for seizures, and carry a card  that lists all your medications. You may get drowsy, dizzy, or have blurred vision. Do not drive, use machinery, or do anything that needs mental alertness until you know how this medicine affects you. To reduce dizzy or fainting spells, do not sit or stand up quickly, especially if you are an older patient. Alcohol can increase drowsiness and dizziness. Avoid alcoholic drinks. Your mouth may get dry. Chewing sugarless gum or sucking hard candy, and drinking plenty of water will help. The use of this medicine may increase the chance of suicidal thoughts or actions. Pay special attention to how you are responding while on this medicine. Any worsening of mood, or thoughts of suicide or dying should be reported to your health care professional right away. Women who become pregnant while using this medicine may enroll in the [place] Pregnancy Registry by calling [phone]. This registry collects information about the safety of antiepileptic drug use during pregnancy. What side effects may I notice from receiving this medicine? Side effects that you should report to your doctor or health care professional as soon as possible: -allergic reactions like skin rash, itching or hives, swelling of the face, lips, or tongue -worsening of mood, thoughts or actions of suicide or dying Side effects that usually do not require medical attention (report to your doctor or health care professional if they continue or are bothersome): -constipation -difficulty walking or controlling muscle movements -dizziness -nausea -slurred speech -tiredness -tremors -weight gain This list may not describe all possible side effects. Call your doctor for medical advice about side effects. You may report side effects to FDA at 1-800-FDA-1088. Where should I keep my medicine? Keep out of reach of children. This medicine may cause accidental overdose and death if it taken by other adults, children, or  pets. Mix any unused medicine with a substance like cat litter or coffee grounds. Then throw the medicine away in a sealed container like a sealed bag or a coffee can with a lid. Do not use the medicine after the expiration date. Store at room temperature between 15 and 30 degrees C (59 and 86 degrees F). NOTE: This sheet is a summary. It may not cover all possible information. If you have questions about this medicine, talk to your doctor, pharmacist, or health care provider.  2018 Elsevier/Gold Standard (2014-02-12 15:26:50)

## 2018-06-02 NOTE — Progress Notes (Signed)
Patient: Patricia Deleon Female    DOB: 1961/04/08   57 y.o.   MRN: [id number] Visit Date: 06/02/2018  Today's Provider: Mar Daring, PA-C   Chief Complaint  Patient presents with  . Follow-up   Subjective:    HPI  Patient here today to follow up on blood pressure, on 05/12/18 patient called to reports that her blood pressure was running high. Patient reports that her blood pressure readings were averaging in the 140's/90s. Patricia Deleon started patient on Amlodipine 5 mg. Patient reports good tolerance and compliance with medication. Patient reports stable blood pressure readings at home.  No Known Allergies   Current Outpatient Medications:  .  amLODipine (NORVASC) 5 MG tablet, Take 1 tablet (5 mg total) by mouth daily., Disp: 30 tablet, Rfl: 0 .  Cholecalciferol (VITAMIN D3) 2000 UNITS capsule, Take by mouth., Disp: , Rfl:  .  DEXILANT 60 MG capsule, TAKE ONE CAPSULE BY MOUTH ONCE DAILY, Disp: 30 capsule, Rfl: 11 .  DULoxetine (CYMBALTA) 30 MG capsule, TAKE 3 CAPSULES BY MOUTH ONCE DAILY, Disp: 90 capsule, Rfl: 1 .  ferrous sulfate 325 (65 FE) MG tablet, Take 325 mg by mouth daily with breakfast., Disp: , Rfl:  .  Glucosamine Sulfate-MSM (MSM/GLUCOSAMINE) 250-250 MG CAPS, Take by mouth 2 (two) times daily. , Disp: , Rfl:  .  levothyroxine (SYNTHROID, LEVOTHROID) 150 MCG tablet, TAKE 1 TABLET BY MOUTH ONCE DAILY BEFORE BREAKFAST, Disp: 90 tablet, Rfl: 1 .  phentermine (ADIPEX-P) 37.5 MG tablet, Take 1 tablet (37.5 mg total) by mouth daily before breakfast., Disp: 30 tablet, Rfl: 2 .  temazepam (RESTORIL) 30 MG capsule, TAKE 1 CAPSULE BY MOUTH AT BEDTIME AS NEEDED FOR SLEEP, Disp: 30 capsule, Rfl: 5  Review of Systems  Constitutional: Negative.   Respiratory: Negative.   Cardiovascular: Negative.   Gastrointestinal: Negative.   Neurological: Negative.     Social History   Tobacco Use  . Smoking status: Never Smoker  . Smokeless tobacco: Never Used  Substance Use  Topics  . Alcohol use: No    Alcohol/week: 0.0 oz   Objective:   BP 120/80 (BP Location: Left Arm, Patient Position: Sitting, Cuff Size: Large)   Pulse 80   Temp 98.7 F (37.1 C)   Resp 16   Wt 293 lb (132.9 kg)   SpO2 98%   BMI 47.29 kg/m  Vitals:   06/02/18 1555  BP: 120/80  Pulse: 80  Resp: 16  Temp: 98.7 F (37.1 C)  SpO2: 98%  Weight: 293 lb (132.9 kg)     Physical Exam  Constitutional: She appears well-developed and well-nourished. No distress.  Neck: Normal range of motion. Neck supple.  Cardiovascular: Normal rate, regular rhythm and normal heart sounds. Exam reveals no gallop and no friction rub.  No murmur heard. Pulmonary/Chest: Effort normal and breath sounds normal. No respiratory distress. She has no wheezes. She has no rales.  Skin: She is not diaphoretic.  Vitals reviewed.      Assessment & Plan:     1. Essential hypertension Improved. Continue current treatment plan.  - amLODipine (NORVASC) 5 MG tablet; Take 1 tablet (5 mg total) by mouth daily.  Dispense: 90 tablet; Refill: 1  2. Sciatica of right side Worsening. Will try gabapentin as below to see if this will help. She is to call if no improvement.  - gabapentin (NEURONTIN) 100 MG capsule; Take 1 capsule (100 mg total) by mouth 2 (two) times daily.  Dispense:  180 capsule; Refill: [place], PA-C  [facility]

## 2018-06-14 ENCOUNTER — Emergency Department (HOSPITAL_COMMUNITY): Payer: 59

## 2018-06-14 ENCOUNTER — Encounter (HOSPITAL_COMMUNITY): Payer: Self-pay | Admitting: *Deleted

## 2018-06-14 ENCOUNTER — Emergency Department (HOSPITAL_COMMUNITY)
Admission: EM | Admit: 2018-06-14 | Discharge: 2018-06-14 | Disposition: A | Discharge status: Home / Self Care | Payer: 59 | Attending: Emergency Medicine | Admitting: Emergency Medicine

## 2018-06-14 DIAGNOSIS — I10 Essential (primary) hypertension: Secondary | ICD-10-CM | POA: Diagnosis not present

## 2018-06-14 DIAGNOSIS — Z79899 Other long term (current) drug therapy: Secondary | ICD-10-CM | POA: Diagnosis not present

## 2018-06-14 DIAGNOSIS — R9389 Abnormal findings on diagnostic imaging of other specified body structures: Secondary | ICD-10-CM | POA: Diagnosis not present

## 2018-06-14 DIAGNOSIS — G43109 Migraine with aura, not intractable, without status migrainosus: Secondary | ICD-10-CM | POA: Insufficient documentation

## 2018-06-14 DIAGNOSIS — E039 Hypothyroidism, unspecified: Secondary | ICD-10-CM | POA: Diagnosis not present

## 2018-06-14 DIAGNOSIS — R200 Anesthesia of skin: Secondary | ICD-10-CM | POA: Diagnosis present

## 2018-06-14 MED ORDER — PROCHLORPERAZINE EDISYLATE 10 MG/2ML IJ SOLN
10.0000 mg | Freq: Once | INTRAMUSCULAR | Status: AC
  Administered 2018-06-14: 10 mg via INTRAMUSCULAR
  Filled 2018-06-14: qty 2

## 2018-06-14 MED ORDER — GADOBENATE DIMEGLUMINE 529 MG/ML IV SOLN
20.0000 mL | Freq: Once | INTRAVENOUS | Status: AC | PRN
  Administered 2018-06-14: 20 mL via INTRAVENOUS

## 2018-06-14 MED ORDER — LORAZEPAM 1 MG PO TABS
1.0000 mg | ORAL_TABLET | Freq: Once | ORAL | Status: AC
  Administered 2018-06-14: 1 mg via ORAL
  Filled 2018-06-14: qty 1

## 2018-06-14 MED ORDER — DIPHENHYDRAMINE HCL 50 MG/ML IJ SOLN
25.0000 mg | Freq: Once | INTRAMUSCULAR | Status: AC
Start: 2018-06-14 — End: 2018-06-14
  Administered 2018-06-14: 25 mg via INTRAMUSCULAR
  Filled 2018-06-14: qty 1

## 2018-06-14 NOTE — ED Notes (Signed)
MRI in with pt, unable to obtain vitals.

## 2018-06-14 NOTE — Discharge Instructions (Addendum)
MRI showed some abnormalities that need further work up in the neurology clinic. Return to ER for any sudden worsening of your symptoms or new symptoms such as focal weakness, speech problems, vision loss/double vision, or confusion.

## 2018-06-14 NOTE — ED Provider Notes (Signed)
I received this patient in signout from Dr. Tyrone Nine.  She had presented with visual changes followed by headache, concerning for complex migraine however given atypical features MRI was obtained which showed possible demyelinating process.  At time of signout, we were awaiting MRI with contrast.  MRI does show several areas of white matter lesions suggestive of demyelination, differential including MS.  I discussed findings with patient and her spouse. Reviewed f/u plan w/ outpatient neurology.  Extensively reviewed return precautions and they voiced understanding.   Hussien Greenblatt, Wenda Overland, MD 06/14/18 1859

## 2018-06-14 NOTE — ED Provider Notes (Signed)
[facility] DEPT Provider Note   CSN: [id number] Arrival date & time: 06/14/18  1259     History   Chief Complaint Chief Complaint  Patient presents with  . Blurred Vision  . Numbness    HPI Patricia Deleon is a 57 y.o. female.  57 yo F with a chief complaint visual loss and left-sided facial numbness.  The patient has been having episodes where she sees a slowly enlarging objects with some wavering that encompasses her entire vision.  She feels like it is more on the left than the right.  Had an episode today where it made it impossible to see for she estimates about 5 minutes.  She also felt that she was numb on the left side of her tongue and the left side of her face.  This resolved spontaneously.  She now has a retro-orbital headache on the left side.  Describes as throbbing worse with bright lights and loud noises.  She denies head injury.  This is about the fourth time the patient has had visual findings like this though she does not remember if she had a headache previously.  The history is provided by the patient.  Illness  This is a recurrent problem. The current episode started 1 to 2 hours ago. The problem occurs rarely. The problem has been resolved. Associated symptoms include headaches. Pertinent negatives include no chest pain and no shortness of breath. Nothing aggravates the symptoms. Nothing relieves the symptoms. She has tried nothing for the symptoms. The treatment provided no relief.    Past Medical History:  Diagnosis Date  . Agoraphobia with panic disorder   . Anemia   . Depression   . Geographic tongue   . GERD (gastroesophageal reflux disease)   . History of esophagogastroduodenoscopy (EGD)   . Hypercholesterolemia   . Hypertension   . Intervertebral disc disorder    thoracic,thoracolumber,lumbosacral  . Obesity   . Sleep apnea     Patient Active Problem List   Diagnosis Date Noted  . Acne rosacea 05/07/2016  .  Agoraphobia with panic disorder 09/28/2015  . Clinical depression 09/28/2015  . 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 Acid reflux 09/28/2015  . Displacement of lumbar intervertebral disc without myelopathy 09/28/2015  . Hypercholesteremia 09/28/2015  . Insomnia 09/28/2015  . Thoracic, 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  . Anemia, iron deficiency 09/28/2015  . Adiposity 09/28/2015  . Episodic paroxysmal anxiety disorder 09/28/2015  . Borderline diabetes 09/28/2015  . Restless leg 09/28/2015  . Apnea, sleep 09/28/2015  . Avitaminosis D 09/28/2015  . Hypothyroidism 07/11/2015  . Hypertension 06/09/2015    Past Surgical History:  Procedure Laterality Date  . COLONOSCOPY    . ESOPHAGOGASTRODUODENOSCOPY (EGD) WITH PROPOFOL N/A 01/31/2016   Procedure: ESOPHAGOGASTRODUODENOSCOPY (EGD) WITH PROPOFOL;  Surgeon: Lollie Sails, MD;  Location[facility] ENDOSCOPY;  Service: Endoscopy;  Laterality: N/A;  . NISSEN FUNDOPLICATION       OB History   None      Home Medications    Prior to Admission medications   Medication Sig Start Date End Date Taking? Authorizing Provider  amLODipine (NORVASC) 5 MG tablet Take 1 tablet (5 mg total) by mouth daily. 06/02/18  Yes Mar Daring, PA-C  Cholecalciferol (VITAMIN D3) 2000 UNITS capsule Take 2,000 Units by mouth daily.    Yes [provider]  DEXILANT 60 MG capsule TAKE ONE CAPSULE BY MOUTH ONCE DAILY 01/20/18  Yes Mar Daring, PA-C  DULoxetine (CYMBALTA) 30  MG capsule TAKE 3 CAPSULES BY MOUTH ONCE DAILY 05/19/18  Yes Mar Daring, PA-C  ferrous sulfate 325 (65 FE) MG tablet Take 325 mg by mouth every evening.    Yes [provider]  gabapentin (NEURONTIN) 100 MG capsule Take 1 capsule (100 mg total) by mouth 2 (two) times daily. 06/02/18  Yes Mar Daring, PA-C  Glucosamine Sulfate-MSM (MSM/GLUCOSAMINE) 250-250 MG CAPS Take 1 tablet by mouth 2 (two) times daily.    Yes  [provider]  levothyroxine (SYNTHROID, LEVOTHROID) 150 MCG tablet TAKE 1 TABLET BY MOUTH ONCE DAILY BEFORE BREAKFAST 05/19/18  Yes Fenton Malling M, PA-C  phentermine (ADIPEX-P) 37.5 MG tablet Take 1 tablet (37.5 mg total) by mouth daily before breakfast. 02/03/18  Yes Burnette, Clearnce Sorrel, PA-C  temazepam (RESTORIL) 30 MG capsule TAKE 1 CAPSULE BY MOUTH AT BEDTIME AS NEEDED FOR SLEEP 03/18/18  Yes Fenton Malling M, PA-C  vitamin C (ASCORBIC ACID) 500 MG tablet Take 500 mg by mouth daily.   Yes [provider]    Family History Family History  Problem Relation Age of Onset  . Lung cancer Mother   . Hypertension Mother   . Alcohol abuse Mother   . Anxiety disorder Mother   . Hypertension Father   . Arthritis Father   . Alcohol abuse Father   . Esophageal cancer Father   . Alcohol abuse Brother   . Hypertension Brother   . Autism Son   . Coronary artery disease Maternal Grandfather   . Heart attack Maternal Grandfather   . CVA Paternal Grandmother   . Skin cancer Paternal Grandmother   . Cancer Maternal Grandmother     Social History Social History   Tobacco Use  . Smoking status: Never Smoker  . Smokeless tobacco: Never Used  Substance Use Topics  . Alcohol use: No    Alcohol/week: 0.0 oz  . Drug use: No     Allergies   Shellfish allergy   Review of Systems Review of Systems  Constitutional: Negative for chills and fever.  HENT: Negative for congestion and rhinorrhea.   Eyes: Positive for visual disturbance. Negative for redness.  Respiratory: Negative for shortness of breath and wheezing.   Cardiovascular: Negative for chest pain and palpitations.  Gastrointestinal: Negative for nausea and vomiting.  Genitourinary: Negative for dysuria and urgency.  Musculoskeletal: Negative for arthralgias and myalgias.  Skin: Negative for pallor and wound.  Neurological: Positive for headaches. Negative for dizziness.     Physical Exam Updated  Vital Signs BP (!) 148/80   Pulse 70   Temp 98.5 F (36.9 C) (Oral)   Resp 17   SpO2 100%   Physical Exam  Constitutional: She is oriented to person, place, and time. She appears well-developed and well-nourished. No distress.  HENT:  Head: Normocephalic and atraumatic.  Eyes: Pupils are equal, round, and reactive to light. EOM are normal.  Neck: Normal range of motion. Neck supple.  Cardiovascular: Normal rate and regular rhythm. Exam reveals no gallop and no friction rub.  No murmur heard. Pulmonary/Chest: Effort normal. She has no wheezes. She has no rales.  Abdominal: Soft. She exhibits no distension. There is no tenderness.  Musculoskeletal: She exhibits no edema or tenderness.  Neurological: She is alert and oriented to person, place, and time. She has normal strength. No cranial nerve deficit or sensory deficit. She displays a negative Romberg sign. Coordination and gait normal. GCS eye subscore is 4. GCS verbal subscore is 5. GCS motor  subscore is 6. She displays no Babinski's sign on the right side. She displays no Babinski's sign on the left side.  Skin: Skin is warm and dry. She is not diaphoretic.  Psychiatric: She has a normal mood and affect. Her behavior is normal.  Nursing note and vitals reviewed.    ED Treatments / Results  Labs (all labs ordered are listed, but only abnormal results are displayed) Labs Reviewed - No data to display  EKG None  Radiology Mr Brain Wo Contrast  Result Date: 06/14/2018 CLINICAL DATA:  58 y/o F; blurred vision and decreased sensation in the left cheek. EXAM: MRI HEAD WITHOUT CONTRAST TECHNIQUE: Multiplanar, multiecho pulse sequences of the brain and surrounding structures were obtained without intravenous contrast. COMPARISON:  None. FINDINGS: Brain: No acute infarction, hemorrhage, hydrocephalus, extra-axial collection or mass lesion. There are several foci of T2 shine through on the diffusion weighted sequence within the left  parietal periventricular white matter. 5 mm cyst within the posterior pituitary. There multiple (approximately 8-10) T2 FLAIR hyperintense lesions within the right inferior cerebellar white matter, right posterior pons, as well as in periventricular white matter predominantly within left parietal and right frontal lobes. Vascular: Normal flow voids. Skull and upper cervical spine: Normal marrow signal. Sinuses/Orbits: Small right maxillary sinus mucous retention cyst. Otherwise negative Other: None. IMPRESSION: 1. No stroke, hemorrhage, or focal mass effect identified. 2. Multiple T2 FLAIR hyperintense lesions in white matter involving periventricular white matter, pons, and right cerebellum. Findings compatible with demyelination such as multiple sclerosis in the appropriate clinical setting. Differential includes microvascular ischemic changes, vasculopathy, or sequelae of prior infectious/inflammatory process. Consider MRI of the brain with contrast to assess for active inflammation. Electronically Signed   By: Kristine Garbe M.D.   On: 06/14/2018 15:43    Procedures Procedures (including critical care time)  Medications Ordered in ED Medications  prochlorperazine (COMPAZINE) injection 10 mg (10 mg Intramuscular Given 06/14/18 1357)  diphenhydrAMINE (BENADRYL) injection 25 mg (25 mg Intramuscular Given 06/14/18 1359)  LORazepam (ATIVAN) tablet 1 mg (1 mg Oral Given 06/14/18 1449)  LORazepam (ATIVAN) tablet 1 mg (1 mg Oral Given 06/14/18 1615)     Initial Impression / Assessment and Plan / ED Course  I have reviewed the triage vital signs and the nursing notes.  Pertinent labs & imaging results that were available during my care of the patient were reviewed by me and considered in my medical decision making (see chart for details).     57 yo F with a chief complaint of visual disturbance and left-sided numbness.  This resolved spontaneously.  She has a benign neurologic exam.  Sounds  like a optical migraine.  I discussed the case with Dr. Malen Gauze, neurology recommended an MRI of the brain without contrast.  MRI was concerning for a demyelinating disease.  I discussed again with Dr. Malen Gauze who recommended repeating an MRI with contrast dye.  As the patient is currently asymptomatic he felt she could be followed as an outpatient.  Recommended Dr. Felecia Shelling as this is his specialty.  The patients results and plan were reviewed and discussed.   Any x-rays performed were independently reviewed by myself.   Differential diagnosis were considered with the presenting HPI.  Medications  prochlorperazine (COMPAZINE) injection 10 mg (10 mg Intramuscular Given 06/14/18 1357)  diphenhydrAMINE (BENADRYL) injection 25 mg (25 mg Intramuscular Given 06/14/18 1359)  LORazepam (ATIVAN) tablet 1 mg (1 mg Oral Given 06/14/18 1449)  LORazepam (ATIVAN) tablet 1 mg (1 mg Oral  Given 06/14/18 1615)    Vitals:   06/14/18 1317 06/14/18 1318 06/14/18 1452 06/14/18 1539  BP: (!) 156/88  (!) 158/95 (!) 148/80  Pulse:  89 87 70  Resp: 16  18 17   Temp: 98.5 F (36.9 C)     TempSrc: Oral     SpO2: 95%  100% 100%    Final diagnoses:  Abnormal MRI  Complicated migraine      Final Clinical Impressions(s) / ED Diagnoses   Final diagnoses:  Abnormal MRI  Complicated migraine    ED Discharge Orders    None       Deno Etienne, DO 06/14/18 1620

## 2018-06-14 NOTE — ED Triage Notes (Addendum)
Pt states she had blurred vision, decreased sensation in left cheek for a few minutes while at work today, around 945AM. Pt states her vision was completely blurred for a few seconds. Pt states vision is now normal. Pt also complains of headache.

## 2018-06-14 NOTE — ED Notes (Signed)
Patient returned from MRI.

## 2018-06-14 NOTE — ED Notes (Signed)
Patient transported to MRI, unable to obtain vitals 

## 2018-06-14 NOTE — ED Notes (Signed)
Patient transported to MRI 

## 2018-06-18 ENCOUNTER — Telehealth: Payer: Self-pay | Admitting: Neurology

## 2018-06-18 ENCOUNTER — Other Ambulatory Visit: Payer: Self-pay | Admitting: *Deleted

## 2018-06-18 ENCOUNTER — Ambulatory Visit: Payer: 59 | Admitting: Neurology

## 2018-06-18 ENCOUNTER — Encounter: Payer: Self-pay | Admitting: Neurology

## 2018-06-18 VITALS — BP 129/83 | HR 79 | Ht 66.0 in | Wt 290.0 lb

## 2018-06-18 DIAGNOSIS — H539 Unspecified visual disturbance: Secondary | ICD-10-CM

## 2018-06-18 DIAGNOSIS — G4730 Sleep apnea, unspecified: Secondary | ICD-10-CM | POA: Diagnosis not present

## 2018-06-18 DIAGNOSIS — I10 Essential (primary) hypertension: Secondary | ICD-10-CM | POA: Diagnosis not present

## 2018-06-18 DIAGNOSIS — R519 Headache, unspecified: Secondary | ICD-10-CM | POA: Insufficient documentation

## 2018-06-18 DIAGNOSIS — N3941 Urge incontinence: Secondary | ICD-10-CM | POA: Diagnosis not present

## 2018-06-18 DIAGNOSIS — R200 Anesthesia of skin: Secondary | ICD-10-CM | POA: Diagnosis not present

## 2018-06-18 DIAGNOSIS — R51 Headache: Secondary | ICD-10-CM

## 2018-06-18 NOTE — Telephone Encounter (Signed)
MRI Cervical: [facility] pending faxed clinical notes.case number [id number].  MR Thoracic [employer] Auth: [id number] (exp. 06/18/18 to 08/02/18).

## 2018-06-18 NOTE — Progress Notes (Signed)
[facility]  PATIENT: Patricia Deleon DOB: 07-05-1961  REFERRING DOCTOR OR PCP:  Fenton Malling, PA-C SOURCE: patient, ED notes, laboratory and imaging reports, MRI images on PACS.  _________________________________   HISTORICAL  CHIEF COMPLAINT:  Chief Complaint  Patient presents with  . New Patient (Initial Visit)    Patient referred for abnormal MRI.     HISTORY OF PRESENT ILLNESS:  I had the pleasure of seeing your patient, Patricia Deleon, and [facility] for neurologic consultation regarding her visual loss, left-sided facial numbness, abnormal MRI and possibility of multiple sclerosis.   About 20 years ago, she had an episode of left visual disturbance x 20 minutes like a disc enlarging in her vision.   This has happened a few other times lasting 5 minutes or so.   Then, she had complete loss of vision (bilateral) for 5-10 seconds and partial visual loss for 5-10 minutes.    Additionally, she was having pain in the left eye and left side of her head going to the neck.   This started 2 weeks ago but worsened 06/14/2018.   Additionally, she had a couple hours of left sided facial numbness.     She went to the emergency room 06/14/2018.     From the ED, the case was discussed with inpatient neurology and an MRI of the brain was obtained.  It was concerning for demyelination showing multiple T2/FLAIR hyperintense foci in the right inferior cerebellum, posterior pons and periventricular white matter.   She was brought back for sagittal FLAIR images and postcontrast images.  There was an additional juxtacortical lesion in the left frontal lobe.  None of the foci enhanced.  Earlier this year she had some blood work and the vitamin D was mildly low (25).  Hemoglobin A1c was borderline elevated.  CMP was normal.  TSH was normal.  CBC did not show anemia though the MCV was mildly reduced the RDW was mildly widened..  The LDL was mildly elevated (124)  HIV and hepatitis C were negative.  She had numbness in her thighs around 2010.    She had a known lumbar disc herniation and was told it may have been due to that.   She had an EMG/NCV.      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   She has had urge incontinence that has been attributed to her weight.  She also has urinary hesitancy, especially at night.    She has OSA, HTN, elevated cholesterol, hypothyroidism.   She does not have DM (borderline HgbA1c though) and she does not smoke.    She has cardiac palpitations and had some PVCs noted.      I personally reviewed the MRI of the brain without contrast and the MRI of the brain with contrast that also included the sagittal FLAIR images.  There are about a dozen T2/FLAIR hyperintense foci includng one in the right cerebellar hemisphere, one just right of midline in the pons, multiple periventricular white matter, some radially oriented to the ventricles, one juxtacortical white matter and a couple of deep white matter foci.  There was a normal enhancement pattern.  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  REVIEW OF SYSTEMS: Constitutional: No fevers, chills, sweats, or change in appetite Eyes: No visual changes, double vision, eye pain Ear, nose and throat: No hearing loss, ear pain, nasal congestion,  sore throat Cardiovascular: No chest pain, palpitations Respiratory: No shortness of breath at rest or with exertion.   No wheezes GastrointestinaI: No nausea, vomiting, diarrhea, abdominal pain, fecal incontinence Genitourinary:As above Musculoskeletal: No neck pain, back pain Integumentary: No rash, pruritus, skin lesions Neurological: as above Psychiatric: No depression at this time.  No anxiety Endocrine: No palpitations, diaphoresis, change in appetite, change in weigh or increased thirst Hematologic/Lymphatic: No anemia, purpura,  petechiae. Allergic/Immunologic: No itchy/runny eyes, nasal congestion, recent allergic reactions, rashes  ALLERGIES: Allergies  Allergen Reactions  . Shellfish Allergy Hives    HOME MEDICATIONS:  Current Outpatient Medications:  .  amLODipine (NORVASC) 5 MG tablet, Take 1 tablet (5 mg total) by mouth daily., Disp: 90 tablet, Rfl: 1 .  Cholecalciferol (VITAMIN D3) 2000 UNITS capsule, Take 2,000 Units by mouth daily. , Disp: , Rfl:  .  DEXILANT 60 MG capsule, TAKE ONE CAPSULE BY MOUTH ONCE DAILY, Disp: 30 capsule, Rfl: 11 .  DULoxetine (CYMBALTA) 30 MG capsule, TAKE 3 CAPSULES BY MOUTH ONCE DAILY, Disp: 90 capsule, Rfl: 1 .  ferrous sulfate 325 (65 FE) MG tablet, Take 325 mg by mouth every evening. , Disp: , Rfl:  .  gabapentin (NEURONTIN) 100 MG capsule, Take 1 capsule (100 mg total) by mouth 2 (two) times daily., Disp: 180 capsule, Rfl: 1 .  Glucosamine Sulfate-MSM (MSM/GLUCOSAMINE) 250-250 MG CAPS, Take 1 tablet by mouth 2 (two) times daily. , Disp: , Rfl:  .  levothyroxine (SYNTHROID, LEVOTHROID) 150 MCG tablet, TAKE 1 TABLET BY MOUTH ONCE DAILY BEFORE BREAKFAST, Disp: 90 tablet, Rfl: 1 .  phentermine (ADIPEX-P) 37.5 MG tablet, Take 1 tablet (37.5 mg total) by mouth daily before breakfast., Disp: 30 tablet, Rfl: 2 .  temazepam (RESTORIL) 30 MG capsule, TAKE 1 CAPSULE BY MOUTH AT BEDTIME AS NEEDED FOR SLEEP, Disp: 30 capsule, Rfl: 5 .  vitamin C (ASCORBIC ACID) 500 MG tablet, Take 500 mg by mouth daily., Disp: , Rfl:   PAST MEDICAL HISTORY: Past Medical History:  Diagnosis Date  . Agoraphobia with panic disorder   . Anemia   . Depression   . Geographic tongue   . GERD (gastroesophageal reflux disease)   . History of esophagogastroduodenoscopy (EGD)   . Hypercholesterolemia   . Hypertension   . Intervertebral disc disorder    thoracic,thoracolumber,lumbosacral  . Obesity   . Sleep apnea     PAST SURGICAL HISTORY: Past Surgical History:  Procedure Laterality Date  .  COLONOSCOPY    . ESOPHAGOGASTRODUODENOSCOPY (EGD) WITH PROPOFOL N/A 01/31/2016   Procedure: ESOPHAGOGASTRODUODENOSCOPY (EGD) WITH PROPOFOL;  Surgeon: Lollie Sails, MD;  Location: [facility] ENDOSCOPY;  Service: Endoscopy;  Laterality: N/A;  . NISSEN FUNDOPLICATION      FAMILY HISTORY: Family History  Problem Relation Age of Onset  . Lung cancer Mother   . Hypertension Mother   . Alcohol abuse Mother   . Anxiety disorder Mother   . Hypertension Father   . Arthritis Father   . Alcohol abuse Father   . Esophageal cancer Father   . Alcohol abuse Brother   . Hypertension Brother   . Autism Son   . Coronary artery disease Maternal Grandfather   . Heart attack Maternal Grandfather   . CVA Paternal Grandmother   . Skin cancer Paternal Grandmother   . Cancer Maternal Grandmother     SOCIAL HISTORY:  Social History   Socioeconomic History  . Marital status: Married    Spouse name: Not on file  . Number of  children: Not on file  . Years of education: Not on file  . Highest education level: Not on file  Occupational History  . Not on file  Social Needs  . Financial resource strain: Not on file  . Food insecurity:    Worry: Not on file    Inability: Not on file  . Transportation needs:    Medical: Not on file    Non-medical: Not on file  Tobacco Use  . Smoking status: Never Smoker  . Smokeless tobacco: Never Used  Substance and Sexual Activity  . Alcohol use: No    Alcohol/week: 0.0 oz  . Drug use: No  . Sexual activity: Not on file  Lifestyle  . Physical activity:    Days per week: Not on file    Minutes per session: Not on file  . Stress: Not on file  Relationships  . Social connections:    Talks on phone: Not on file    Gets together: Not on file    Attends religious service: Not on file    Active member of club or organization: Not on file    Attends meetings of clubs or organizations: Not on file    Relationship status: Not on file  . Intimate partner  violence:    Fear of current or ex partner: Not on file    Emotionally abused: Not on file    Physically abused: Not on file    Forced sexual activity: Not on file  Other Topics Concern  . Not on file  Social History Narrative  . Not on file     PHYSICAL EXAM  Vitals:   06/18/18 0821  BP: 129/83  Pulse: 79  Weight: 290 lb (131.5 kg)  Height: 5\' 6"  (1.676 m)    Body mass index is 46.81 kg/m.   General: The patient is well-developed and well-nourished and in no acute distress  Eyes:  Funduscopic exam shows normal optic discs and retinal vessels.  Neck: The neck is supple, no carotid bruits are noted.  The neck is nontender.  Cardiovascular: The heart has a regular rate and rhythm with a normal S1 and S2. There were no murmurs, gallops or rubs. Lungs are clear to auscultation.  Skin: Extremities are without significant edema.  Musculoskeletal:  Back is nontender  Neurologic Exam  Mental status: The patient is alert and oriented x 3 at the time of the examination. The patient has apparent normal recent and remote memory, with an apparently normal attention span and concentration ability.   Speech is normal.  Cranial nerves: Extraocular movements are full. Pupils are equal, round, and reactive to light and accomodation.  Visual fields are full.   Color vision was normal and symmetric.  Visual acuity was normal and symmetric.  Facial symmetry is present. There is good facial sensation to soft touch bilaterally.Facial strength is normal.  Trapezius and sternocleidomastoid strength is normal. No dysarthria is noted.  The tongue is midline, and the patient has symmetric elevation of the soft palate. No obvious hearing deficits are noted.  Motor:  Muscle bulk is normal.   Tone is normal. Strength is  5 / 5 in all 4 extremities.   Sensory: Sensory testing is intact to pinprick, soft touch and vibration sensation in all 4 extremities.  Coordination: Cerebellar testing reveals good  finger-nose-finger and heel-to-shin bilaterally.  Gait and station: Station is normal.   Gait is normal. Tandem gait is normal. Romberg is negative.   Reflexes: Deep tendon reflexes  are symmetric and normal bilaterally.   Plantar responses are flexor.    DIAGNOSTIC DATA (LABS, IMAGING, TESTING) - I reviewed patient records, labs, notes, testing and imaging myself where available.  Lab Results  Component Value Date   WBC 6.3 02/03/2018   HGB 12.5 02/03/2018   HCT 40.0 02/03/2018   MCV 77 (L) 02/03/2018   PLT 285 02/03/2018      Component Value Date/Time   NA 139 02/03/2018 1513   K 4.3 02/03/2018 1513   CL 103 02/03/2018 1513   CO2 20 02/03/2018 1513   GLUCOSE 84 02/03/2018 1513   BUN 19 02/03/2018 1513   CREATININE 1.00 02/03/2018 1513   CALCIUM 9.5 02/03/2018 1513   PROT 7.6 02/03/2018 1513   ALBUMIN 4.5 02/03/2018 1513   AST 16 02/03/2018 1513   ALT 18 02/03/2018 1513   ALKPHOS 79 02/03/2018 1513   BILITOT <0.2 02/03/2018 1513   GFRNONAA 64 02/03/2018 1513   GFRAA 74 02/03/2018 1513   Lab Results  Component Value Date   CHOL 197 02/03/2018   HDL 41 02/03/2018   LDLCALC 124 (H) 02/03/2018   TRIG 161 (H) 02/03/2018   CHOLHDL 5.5 (H) 09/28/2015   Lab Results  Component Value Date   HGBA1C 5.7 (H) 02/03/2018   No results found for: VITAMINB12 Lab Results  Component Value Date   TSH 2.460 02/03/2018       ASSESSMENT AND PLAN  Transient vision disturbance of left eye - Plan: US Carotid Bilateral, ECHOCARDIOGRAM COMPLETE BUBBLE STUDY, ANA w/Reflex, Sedimentation rate, Pan-ANCA  Nonintractable headache, unspecified chronicity pattern, unspecified headache type - Plan: ANA w/Reflex, Sedimentation rate, Pan-ANCA  Sleep apnea, unspecified type  Essential hypertension  Urge incontinence - Plan: MR CERVICAL SPINE WO CONTRAST, MR THORACIC SPINE WO CONTRAST  Numbness - Plan: MR CERVICAL SPINE WO CONTRAST, MR THORACIC SPINE WO CONTRAST   In summary, Mrs.  Boulay is a 57 year old woman who has had transient visual symptoms lasting 5 to 20 minutes, sometimes associated with headache and once associated with left facial numbness.  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  She does have some urinary dysfunction of unknown etiology.  Most concerning, is an abnormal MRI with lesions in a pattern and configuration potentially concerning for multiple sclerosis.  They could also be due to chronic ischemic events.  Her clinical events, however, are not typical for MS.  I discussed with her that we need to try to figure out what has caused the abnormal findings in the brain as treatment might help to prevent problems in the future.  I will check an MRI of the cervical and thoracic spine to see if there is any other evidence of demyelination.  Lesions in the spinal cord would greatly increase the likelihood that this represents MS.  We will also check some lab work to assess for the possibility of vasculitis and check a carotid Doppler and an echocardiogram with bubble contrast to rule out sources of TIA and stroke.    If this evaluation is completely normal, then, I would consider either a lumbar puncture or a follow-up MRI in 6 to 9 months to determine if there is more evidence of MS.   She will return to see me in 3 months or sooner if any new or worsening neurologic symptoms.  Thank you for asking me to see Mrs. Yepez.  Please let me know if I can be of further assistance with her or other patients in the future.  Richard A. Felecia Shelling, MD, PhD, FAAN Certified in Neurology, Clinical Neurophysiology, Sleep Medicine, Pain Medicine and Neuroimaging Director, [facility] [address], [place], [place] [address] [phone]

## 2018-06-19 ENCOUNTER — Ambulatory Visit (HOSPITAL_COMMUNITY)
Admission: RE | Admit: 2018-06-19 | Discharge: 2018-06-19 | Disposition: A | Discharge status: Home / Self Care | Payer: 59 | Source: Ambulatory Visit | Attending: Neurology | Admitting: Neurology

## 2018-06-19 DIAGNOSIS — H539 Unspecified visual disturbance: Secondary | ICD-10-CM | POA: Insufficient documentation

## 2018-06-19 LAB — SEDIMENTATION RATE: SED RATE: 31 mm/h (ref 0–40)

## 2018-06-19 LAB — ANA W/REFLEX: ANA: NEGATIVE

## 2018-06-19 NOTE — Telephone Encounter (Signed)
MRI Cervical [employer] Auth: [id number] (exp. 06/18/18 to 08/02/18) I sent order to GI and they will reach out to the pt to schedule.

## 2018-06-19 NOTE — Progress Notes (Signed)
Bilateral carotid duplex completed. There is no evidence of a extracranial ICA stenosis. Vertebral artey flow is antegrade. [facility], [place] 06/19/2018 10:58 AM

## 2018-06-20 ENCOUNTER — Telehealth: Payer: Self-pay | Admitting: *Deleted

## 2018-06-20 NOTE — Telephone Encounter (Signed)
LMOM (identified vm) with below lab and carotid u/s results.  She does not need to return this call unless she has questions/fim

## 2018-06-20 NOTE — Telephone Encounter (Signed)
-----   Message from Britt Bottom, MD sent at 06/20/2018 10:49 AM EDT ----- Please let the patient know that the lab work is fine.  The carotid ulltrasound is also normal.

## 2018-06-25 ENCOUNTER — Other Ambulatory Visit: Payer: Self-pay

## 2018-06-25 ENCOUNTER — Telehealth: Payer: Self-pay | Admitting: Neurology

## 2018-06-25 ENCOUNTER — Ambulatory Visit (HOSPITAL_COMMUNITY): Payer: 59 | Attending: Cardiology

## 2018-06-25 ENCOUNTER — Other Ambulatory Visit (HOSPITAL_COMMUNITY): Payer: 59

## 2018-06-25 DIAGNOSIS — I059 Rheumatic mitral valve disease, unspecified: Secondary | ICD-10-CM | POA: Insufficient documentation

## 2018-06-25 DIAGNOSIS — R002 Palpitations: Secondary | ICD-10-CM | POA: Insufficient documentation

## 2018-06-25 DIAGNOSIS — E785 Hyperlipidemia, unspecified: Secondary | ICD-10-CM | POA: Diagnosis not present

## 2018-06-25 DIAGNOSIS — E039 Hypothyroidism, unspecified: Secondary | ICD-10-CM | POA: Insufficient documentation

## 2018-06-25 DIAGNOSIS — I119 Hypertensive heart disease without heart failure: Secondary | ICD-10-CM | POA: Insufficient documentation

## 2018-06-25 DIAGNOSIS — H539 Unspecified visual disturbance: Secondary | ICD-10-CM | POA: Diagnosis present

## 2018-06-25 DIAGNOSIS — I493 Ventricular premature depolarization: Secondary | ICD-10-CM | POA: Insufficient documentation

## 2018-06-25 DIAGNOSIS — R200 Anesthesia of skin: Secondary | ICD-10-CM | POA: Diagnosis not present

## 2018-06-25 MED ORDER — ALPRAZOLAM 0.5 MG PO TABS: ORAL_TABLET | ORAL | 0 refills | Status: DC

## 2018-06-25 MED ORDER — PERFLUTREN LIPID MICROSPHERE: 1.0000 mL | INTRAVENOUS | Status: AC | PRN

## 2018-06-25 NOTE — Telephone Encounter (Signed)
Rx. awaiting RAS sig/fim 

## 2018-06-25 NOTE — Telephone Encounter (Signed)
Alprazolam rx. faxed to [facility], with fax confirmation received/fim

## 2018-06-25 NOTE — Telephone Encounter (Signed)
Pt states that she is claustrophobic  And she needs something called in for her upcoming MRI on 07-01 please send to  [id number] - [place], [place] - [name] [phone] (Phone) [phone] (Fax)

## 2018-06-25 NOTE — Addendum Note (Signed)
Addended by: France Ravens I on: 06/25/2018 03:22 PM   Modules accepted: Orders

## 2018-06-26 ENCOUNTER — Telehealth: Payer: Self-pay

## 2018-06-26 NOTE — Telephone Encounter (Signed)
-----   Message from Britt Bottom, MD sent at 06/25/2018  6:11 PM EDT ----- Please let her know that the echocardiogram was normal

## 2018-06-26 NOTE — Telephone Encounter (Signed)
I called pt, left a detailed message on her cell phone, per DPR, advising her that her echocardiogram was normal and to call us back for further questions or concerns.

## 2018-06-30 ENCOUNTER — Ambulatory Visit
Admission: RE | Admit: 2018-06-30 | Discharge: 2018-06-30 | Disposition: A | Discharge status: Home / Self Care | Payer: 59 | Source: Ambulatory Visit | Attending: Neurology | Admitting: Neurology

## 2018-06-30 DIAGNOSIS — N3941 Urge incontinence: Secondary | ICD-10-CM

## 2018-06-30 DIAGNOSIS — R200 Anesthesia of skin: Secondary | ICD-10-CM

## 2018-07-02 ENCOUNTER — Encounter: Payer: Self-pay | Admitting: *Deleted

## 2018-07-07 ENCOUNTER — Ambulatory Visit: Payer: 59 | Admitting: Certified Registered Nurse Anesthetist

## 2018-07-07 ENCOUNTER — Encounter
Admission: RE | Disposition: A | Discharge status: Home / Self Care | Payer: Self-pay | Source: Ambulatory Visit | Attending: Gastroenterology

## 2018-07-07 ENCOUNTER — Ambulatory Visit
Admission: RE | Admit: 2018-07-07 | Discharge: 2018-07-07 | Disposition: A | Discharge status: Home / Self Care | Payer: 59 | Source: Ambulatory Visit | Attending: Gastroenterology | Admitting: Gastroenterology

## 2018-07-07 ENCOUNTER — Telehealth: Payer: Self-pay | Admitting: Neurology

## 2018-07-07 ENCOUNTER — Encounter: Payer: Self-pay | Admitting: Certified Registered Nurse Anesthetist

## 2018-07-07 DIAGNOSIS — E039 Hypothyroidism, unspecified: Secondary | ICD-10-CM | POA: Insufficient documentation

## 2018-07-07 DIAGNOSIS — G4730 Sleep apnea, unspecified: Secondary | ICD-10-CM | POA: Insufficient documentation

## 2018-07-07 DIAGNOSIS — K621 Rectal polyp: Secondary | ICD-10-CM | POA: Diagnosis not present

## 2018-07-07 DIAGNOSIS — K635 Polyp of colon: Secondary | ICD-10-CM | POA: Diagnosis not present

## 2018-07-07 DIAGNOSIS — K644 Residual hemorrhoidal skin tags: Secondary | ICD-10-CM | POA: Insufficient documentation

## 2018-07-07 DIAGNOSIS — Z1211 Encounter for screening for malignant neoplasm of colon: Secondary | ICD-10-CM | POA: Insufficient documentation

## 2018-07-07 DIAGNOSIS — I10 Essential (primary) hypertension: Secondary | ICD-10-CM | POA: Diagnosis not present

## 2018-07-07 DIAGNOSIS — Z91013 Allergy to seafood: Secondary | ICD-10-CM | POA: Insufficient documentation

## 2018-07-07 DIAGNOSIS — F329 Major depressive disorder, single episode, unspecified: Secondary | ICD-10-CM | POA: Insufficient documentation

## 2018-07-07 DIAGNOSIS — Z79899 Other long term (current) drug therapy: Secondary | ICD-10-CM | POA: Insufficient documentation

## 2018-07-07 DIAGNOSIS — Z6841 Body Mass Index (BMI) 40.0 and over, adult: Secondary | ICD-10-CM | POA: Insufficient documentation

## 2018-07-07 DIAGNOSIS — F4001 Agoraphobia with panic disorder: Secondary | ICD-10-CM | POA: Insufficient documentation

## 2018-07-07 DIAGNOSIS — E7800 Pure hypercholesterolemia, unspecified: Secondary | ICD-10-CM | POA: Insufficient documentation

## 2018-07-07 DIAGNOSIS — K219 Gastro-esophageal reflux disease without esophagitis: Secondary | ICD-10-CM | POA: Diagnosis not present

## 2018-07-07 DIAGNOSIS — Z8711 Personal history of peptic ulcer disease: Secondary | ICD-10-CM | POA: Insufficient documentation

## 2018-07-07 DIAGNOSIS — K5730 Diverticulosis of large intestine without perforation or abscess without bleeding: Secondary | ICD-10-CM | POA: Insufficient documentation

## 2018-07-07 HISTORY — PX: COLONOSCOPY WITH PROPOFOL: SHX5780

## 2018-07-07 HISTORY — DX: Benign neoplasm of stomach: D13.1

## 2018-07-07 HISTORY — DX: Other gastritis without bleeding: K29.60

## 2018-07-07 SURGERY — COLONOSCOPY WITH PROPOFOL
Anesthesia: General

## 2018-07-07 MED ORDER — SODIUM CHLORIDE 0.9 % IV SOLN
INTRAVENOUS | Status: DC
  Administered 2018-07-07 (×2): via INTRAVENOUS

## 2018-07-07 MED ORDER — PROPOFOL 500 MG/50ML IV EMUL
INTRAVENOUS | Status: DC | PRN
  Administered 2018-07-07: 125 ug/kg/min via INTRAVENOUS

## 2018-07-07 MED ORDER — EPHEDRINE SULFATE 50 MG/ML IJ SOLN
INTRAMUSCULAR | Status: DC | PRN
  Administered 2018-07-07: 5 mg via INTRAVENOUS

## 2018-07-07 MED ORDER — PROPOFOL 10 MG/ML IV BOLUS
INTRAVENOUS | Status: DC | PRN
  Administered 2018-07-07: 50 mg via INTRAVENOUS
  Administered 2018-07-07 (×2): 30 mg via INTRAVENOUS

## 2018-07-07 MED ORDER — LIDOCAINE HCL (CARDIAC) PF 100 MG/5ML IV SOSY
PREFILLED_SYRINGE | INTRAVENOUS | Status: DC | PRN
  Administered 2018-07-07: 50 mg via INTRAVENOUS

## 2018-07-07 MED ORDER — PHENYLEPHRINE HCL 10 MG/ML IJ SOLN
INTRAMUSCULAR | Status: DC | PRN
  Administered 2018-07-07 (×2): 100 ug via INTRAVENOUS
  Administered 2018-07-07 (×2): 200 ug via INTRAVENOUS

## 2018-07-07 NOTE — H&P (Signed)
Outpatient short stay form Pre-procedure 07/07/2018 1:59 PM Patricia Sails MD  Primary Physician: Lillia Abed PA  Reason for visit: Colonoscopy  History of present illness: Patient is a 57 year old female presenting today as above, colon cancer screening.  Is no problems with abdominal pain diarrhea or rectal bleeding.  Tolerating her prep although did get nauseated with it.  She takes no recent aspirin products and does not take a blood thinner.    Current Facility-Administered Medications:  .  0.9 %  sodium chloride infusion, , Intravenous, Continuous, Patricia Sails, MD, Last Rate: 20 mL/hr at 07/07/18 1344  Medications Prior to Admission  Medication Sig Dispense Refill Last Dose  . amLODipine (NORVASC) 5 MG tablet Take 1 tablet (5 mg total) by mouth daily. 90 tablet 1 Past Week at Unknown time  . Cholecalciferol (VITAMIN D3) 2000 UNITS capsule Take 2,000 Units by mouth daily.    Past Week at Unknown time  . DEXILANT 60 MG capsule TAKE ONE CAPSULE BY MOUTH ONCE DAILY 30 capsule 11 07/06/2018 at Unknown time  . DULoxetine (CYMBALTA) 30 MG capsule TAKE 3 CAPSULES BY MOUTH ONCE DAILY 90 capsule 1 Past Week at Unknown time  . ferrous sulfate 325 (65 FE) MG tablet Take 325 mg by mouth every evening.    Past Week at Unknown time  . gabapentin (NEURONTIN) 100 MG capsule Take 1 capsule (100 mg total) by mouth 2 (two) times daily. 180 capsule 1 07/06/2018 at Unknown time  . Glucosamine Sulfate-MSM (MSM/GLUCOSAMINE) 250-250 MG CAPS Take 1 tablet by mouth 2 (two) times daily.    Past Week at Unknown time  . levothyroxine (SYNTHROID, LEVOTHROID) 150 MCG tablet TAKE 1 TABLET BY MOUTH ONCE DAILY BEFORE BREAKFAST 90 tablet 1 Past Week at Unknown time  . phentermine (ADIPEX-P) 37.5 MG tablet Take 1 tablet (37.5 mg total) by mouth daily before breakfast. 30 tablet 2 Past Week at Unknown time  . temazepam (RESTORIL) 30 MG capsule TAKE 1 CAPSULE BY MOUTH AT BEDTIME AS NEEDED FOR SLEEP 30 capsule 5  07/06/2018 at Unknown time  . vitamin C (ASCORBIC ACID) 500 MG tablet Take 500 mg by mouth daily.   Past Week at Unknown time  . ALPRAZolam (XANAX) 0.5 MG tablet Take1-2 tablets 30 minutes prior to MRI. Do not drive, operate dangerous equipment or drink alcohol after taking this medication (Patient not taking: Reported on 07/07/2018) 2 tablet 0 Not Taking at Unknown time  . sucralfate (CARAFATE) 1 g tablet Take 1 g by mouth 4 (four) times daily.   Not Taking at Unknown time     Allergies  Allergen Reactions  . Shellfish Allergy Hives     Past Medical History:  Diagnosis Date  . Agoraphobia with panic disorder   . Anemia   . Depression   . Erosive gastritis   . Fundic gland polyps of stomach, benign   . Geographic tongue   . GERD (gastroesophageal reflux disease)   . History of esophagogastroduodenoscopy (EGD)   . Hypercholesterolemia   . Hypertension   . Intervertebral disc disorder    thoracic,thoracolumber,lumbosacral  . Obesity   . Sleep apnea     Review of systems:      Physical Exam    Heart and lungs: Without rub or gallop, lungs are bilaterally clear.    HEENT: Normocephalic atraumatic eyes are anicteric    Other:    Pertinant exam for procedure: Soft nontender nondistended bowel sounds positive normoactive.    Planned proceedures: Colonoscopy and  indicated procedures. I have discussed the risks benefits and complications of procedures to include not limited to bleeding, infection, perforation and the risk of sedation and the patient wishes to proceed.    Patricia Sails, MD Gastroenterology 07/07/2018  1:59 PM

## 2018-07-07 NOTE — Anesthesia Preprocedure Evaluation (Signed)
Anesthesia Evaluation  Patient identified by MRN, date of birth, ID band Patient awake    Reviewed: Allergy & Precautions, NPO status , Patient's Chart, lab work & pertinent test results  History of Anesthesia Complications Negative for: history of anesthetic complications  Airway Mallampati: II       Dental  (+) Teeth Intact, Caps, Dental Advidsory Given   Pulmonary neg shortness of breath, sleep apnea and Continuous Positive Airway Pressure Ventilation , neg COPD, neg recent URI,     + decreased breath sounds      Cardiovascular Exercise Tolerance: Good hypertension, Pt. on medications (-) angina(-) CAD, (-) Past MI, (-) Cardiac Stents and (-) CABG + dysrhythmias (palpitations) (-) Valvular Problems/Murmurs Rhythm:Regular     Neuro/Psych PSYCHIATRIC DISORDERS Anxiety Depression negative neurological ROS     GI/Hepatic Neg liver ROS, PUD, GERD  ,  Endo/Other  neg diabetesHypothyroidism Morbid obesity  Renal/GU negative Renal ROS     Musculoskeletal   Abdominal (+) + obese,   Peds  Hematology  (+) anemia ,   Anesthesia Other Findings Past Medical History: No date: Agoraphobia with panic disorder No date: Anemia No date: Depression No date: Erosive gastritis No date: Fundic gland polyps of stomach, benign No date: Geographic tongue No date: GERD (gastroesophageal reflux disease) No date: History of esophagogastroduodenoscopy (EGD) No date: Hypercholesterolemia No date: Hypertension No date: Intervertebral disc disorder     Comment:  thoracic,thoracolumber,lumbosacral No date: Obesity No date: Sleep apnea   Reproductive/Obstetrics                             Anesthesia Physical  Anesthesia Plan  ASA: III  Anesthesia Plan: General   Post-op Pain Management:    Induction: Intravenous  PONV Risk Score and Plan: 3 and Propofol infusion and TIVA  Airway Management Planned:  Natural Airway and Nasal Cannula  Additional Equipment:   Intra-op Plan:   Post-operative Plan:   Informed Consent: I have reviewed the patients History and Physical, chart, labs and discussed the procedure including the risks, benefits and alternatives for the proposed anesthesia with the patient or authorized representative who has indicated his/her understanding and acceptance.     Plan Discussed with: CRNA  Anesthesia Plan Comments:         Anesthesia Quick Evaluation

## 2018-07-07 NOTE — Telephone Encounter (Signed)
I spoke to Patricia Deleon about the MRI results.  The MRI of the cervical spine shows a focus to the right at C4 and possibly another focus midline at C3.  The MRI of the thoracic spine shows midline foci at T5-T6 and T6-T7 and midline to the right at T7-T8.  Combination of the brain and spine MRIs help to more decisively diagnose her with multiple sclerosis.  I feel that she should be on a disease modifying therapy.  Please see if we can bring her in either this week or next week.

## 2018-07-07 NOTE — Op Note (Addendum)
[facility] Gastroenterology Patient Name: Patricia Deleon Procedure Date: 07/07/2018 1:57 PM MRN: [id number] Account #: [id number] Date of Birth: 07/03/1961 Admit Type: Outpatient Age: 57 Room: [facility] ENDO ROOM 1 Gender: Female Note Status: Finalized Procedure:            Colonoscopy Indications:          Screening for colorectal malignant neoplasm Providers:            Lollie Sails, MD Referring MD:         Mar Daring (Referring MD) Medicines:            Monitored Anesthesia Care Complications:        No immediate complications. Procedure:            Pre-Anesthesia Assessment:                       - ASA Grade Assessment: III - A patient with severe                        systemic disease.                       After obtaining informed consent, the colonoscope was                        passed under direct vision. Throughout the procedure,                        the patient's blood pressure, pulse, and oxygen                        saturations were monitored continuously. The                        Colonoscope was introduced through the anus and                        advanced to the the cecum, identified by appendiceal                        orifice and ileocecal valve. The colonoscopy was                        performed with moderate difficulty due to significant                        looping. Successful completion of the procedure was                        aided by using manual pressure. The patient tolerated                        the procedure well. The quality of the bowel                        preparation was good. Findings:      Four sessile polyps were found in the rectum. The polyps were 1 to 2 mm       in size. These polyps were removed with a cold biopsy forceps. Resection       and retrieval were complete.  A 4 mm polyp was found in the descending colon. The polyp was sessile.       The polyp was removed with a cold snare.  Resection and retrieval were       complete.      Two sessile polyps were found in the distal transverse colon. The polyps       were 1 to 2 mm in size. These polyps were removed with a cold biopsy       forceps. Resection and retrieval were complete.      A 4 mm polyp was found in the distal transverse colon. The polyp was       sessile. The polyp was removed with a cold snare. Resection and       retrieval were complete.      A 2 mm polyp was found in the sigmoid colon. The polyp was sessile. The       polyp was removed with a cold biopsy forceps. Resection and retrieval       were complete.      A 2 mm polyp was found in the distal sigmoid colon. The polyp was       sessile. The polyp was removed with a cold biopsy forceps. Resection and       retrieval were complete.      A few small-mouthed diverticula were found in the sigmoid colon and       descending colon.      The retroflexed view of the distal rectum and anal verge was normal and       showed no anal or rectal abnormalities.      Non-bleeding external hemorrhoids were found during perianal exam. The       hemorrhoids were small. Impression:           - Four 1 to 2 mm polyps in the rectum, removed with a                        cold biopsy forceps. Resected and retrieved.                       - One 4 mm polyp in the descending colon, removed with                        a cold snare. Resected and retrieved.                       - Two 1 to 2 mm polyps in the distal transverse colon,                        removed with a cold biopsy forceps. Resected and                        retrieved.                       - One 4 mm polyp in the distal transverse colon,                        removed with a cold snare. Resected and retrieved.                       - One 2 mm polyp in the sigmoid  colon, removed with a                        cold biopsy forceps. Resected and retrieved.                       - One 2 mm polyp in the distal  sigmoid colon, removed                        with a cold biopsy forceps. Resected and retrieved.                       - Diverticulosis in the sigmoid colon and in the                        descending colon.                       - The distal rectum and anal verge are normal on                        retroflexion view.                       - Non-bleeding external hemorrhoids. Recommendation:       - Discharge patient to home.                       - Await pathology results.                       - Telephone GI clinic for pathology results in 1 week. Procedure Code(s):    --- Professional ---                       [phone], Colonoscopy, flexible; with removal of tumor(s),                        polyp(s), or other lesion(s) by snare technique                       45380, 48, Colonoscopy, flexible; with biopsy, single                        or multiple Diagnosis Code(s):    --- Professional ---                       Z12.11, Encounter for screening for malignant neoplasm                        of colon                       K62.1, Rectal polyp                       D12.3, Benign neoplasm of transverse colon (hepatic                        flexure or splenic flexure)                       D12.4, Benign neoplasm of descending colon  D12.5, Benign neoplasm of sigmoid colon                       K64.4, Residual hemorrhoidal skin tags                       K57.30, Diverticulosis of large intestine without                        perforation or abscess without bleeding CPT copyright 2017 American Medical Association. All rights reserved. The codes documented in this report are preliminary and upon coder review may  be revised to meet current compliance requirements. Lollie Sails, MD 07/07/2018 3:00:07 PM This report has been signed electronically. Number of Addenda: 0 Note Initiated On: 07/07/2018 1:57 PM Scope Withdrawal Time: 0 hours 18 minutes 18 seconds  Total  Procedure Duration: 0 hours 39 minutes 9 seconds       [facility]

## 2018-07-07 NOTE — Transfer of Care (Signed)
Immediate Anesthesia Transfer of Care Note  Patient: Patricia Deleon  Procedure(s) Performed: COLONOSCOPY WITH PROPOFOL (N/A )  Patient Location: PACU  Anesthesia Type:General  Level of Consciousness: awake, alert  and oriented  Airway & Oxygen Therapy: Patient Spontanous Breathing and Patient connected to nasal cannula oxygen  Post-op Assessment: Report given to RN and Post -op Vital signs reviewed and stable  Post vital signs: stable  Last Vitals:  Vitals Value Taken Time  BP    Temp 36.1 C 07/07/2018  2:58 PM  Pulse 87 07/07/2018  3:01 PM  Resp 19 07/07/2018  3:01 PM  SpO2 95 % 07/07/2018  3:01 PM  Vitals shown include unvalidated device data.  Last Pain:  Vitals:   07/07/18 1458  TempSrc: Tympanic  PainSc: 0-No pain         Complications: No apparent anesthesia complications

## 2018-07-07 NOTE — Anesthesia Post-op Follow-up Note (Signed)
Anesthesia QCDR form completed.        

## 2018-07-08 ENCOUNTER — Ambulatory Visit: Payer: 59 | Admitting: Neurology

## 2018-07-08 ENCOUNTER — Other Ambulatory Visit: Payer: Self-pay

## 2018-07-08 ENCOUNTER — Encounter: Payer: Self-pay | Admitting: *Deleted

## 2018-07-08 ENCOUNTER — Encounter: Payer: Self-pay | Admitting: Gastroenterology

## 2018-07-08 VITALS — BP 139/91 | HR 80 | Resp 18 | Ht 66.0 in | Wt 289.0 lb

## 2018-07-08 DIAGNOSIS — M542 Cervicalgia: Secondary | ICD-10-CM

## 2018-07-08 DIAGNOSIS — G35 Multiple sclerosis: Secondary | ICD-10-CM

## 2018-07-08 DIAGNOSIS — R51 Headache: Secondary | ICD-10-CM

## 2018-07-08 DIAGNOSIS — N3941 Urge incontinence: Secondary | ICD-10-CM

## 2018-07-08 DIAGNOSIS — R519 Headache, unspecified: Secondary | ICD-10-CM

## 2018-07-08 NOTE — Anesthesia Postprocedure Evaluation (Signed)
Anesthesia Post Note  Patient: Patricia Deleon  Procedure(s) Performed: COLONOSCOPY WITH PROPOFOL (N/A )  Patient location during evaluation: Endoscopy Anesthesia Type: General Level of consciousness: awake and alert Pain management: pain level controlled Vital Signs Assessment: post-procedure vital signs reviewed and stable Respiratory status: spontaneous breathing, nonlabored ventilation, respiratory function stable and patient connected to nasal cannula oxygen Cardiovascular status: blood pressure returned to baseline and stable Postop Assessment: no apparent nausea or vomiting Anesthetic complications: no     Last Vitals:  Vitals:   07/07/18 1518 07/07/18 1535  BP: 108/67 112/62  Pulse: 72 73  Resp:    Temp:    SpO2: 100% 98%    Last Pain:  Vitals:   07/08/18 0732  TempSrc:   PainSc: 0-No pain                 Martha Clan

## 2018-07-08 NOTE — Telephone Encounter (Signed)
Spoke with Barbera Setters and offered appt. 11am today.  She is on her way to work; will check to see if she can leave work for this appt. and call me back/fim

## 2018-07-08 NOTE — Telephone Encounter (Signed)
Pt. called back and has been added to the schedule for 11am today/fim

## 2018-07-08 NOTE — Progress Notes (Signed)
[facility]  PATIENT: Patricia Deleon DOB: 1961/10/05  REFERRING DOCTOR OR PCP:  Fenton Malling, PA-C SOURCE: patient, ED notes, laboratory and imaging reports, MRI images on PACS.  _________________________________   HISTORICAL  CHIEF COMPLAINT:  Chief Complaint  Patient presents with  . Multiple Sclerosis    Discuss tx. options for newly dx. MS/fim    HISTORY OF PRESENT ILLNESS:  Patricia Deleon is a 57 yo woman with episodes of visual loss, left-sided facial numbness with abnormal brain and spine MRI c/w MS.  Update 07/08/2018: I personally reviewed the MRIs of the cervical and thoracic spine performed last week and the MRI of the brain from last month and showed the images to [name].   The MRI of the cervical spine shows a focus to the right at C4 and possibly another focus midline at C3. The MRI of the thoracic spine shows midline foci at T5-T6 and T6-T7 and midline to the right at T7-T8.  We discussed that the combination of the brain and spine MRIs help [place] to diagnose her with multiple sclerosis.  I feel that she should be on a disease modifying therapy.  We had a long discussion about disease modifying therapies for MS.  She is most interested in the oral agents at this time but would be interested in switching to more efficacious intravenous medication if she needs to in the future if there is any breakthrough.  She has continued to have some intermittent episodes of visual symptoms followed by headache.  Most of the visual symptoms occur toward the left but one occurred on the right recently.   Additionally, she has had more constant neck pain and back of the head pain on the left over the past couple weeks.  She feels her bladder symptoms are doing about the same.  From 06/18/2018: About 20 years ago, she had an episode of left visual disturbance x 20 minutes like a disc enlarging in her vision.   This has happened a few other times  lasting 5 minutes or so.   Then, she had complete loss of vision (bilateral) for 5-10 seconds and partial visual loss for 5-10 minutes.    Additionally, she was having pain in the left eye and left side of her head going to the neck.   This started 2 weeks ago but worsened 06/14/2018.   Additionally, she had a couple hours of left sided facial numbness.     She went to the emergency room 06/14/2018.     From the ED, the case was discussed with inpatient neurology and an MRI of the brain was obtained.  It was concerning for demyelination showing multiple T2/FLAIR hyperintense foci in the right inferior cerebellum, posterior pons and periventricular white matter.   She was brought back for sagittal FLAIR images and postcontrast images.  There was an additional juxtacortical lesion in the left frontal lobe.  None of the foci enhanced.  Earlier this year she had some blood work and the vitamin D was mildly low (25).  Hemoglobin A1c was borderline elevated.  CMP was normal.  TSH was normal.  CBC did not show anemia though the MCV was mildly reduced the RDW was mildly widened..  The LDL was mildly elevated (124) HIV and hepatitis C were negative.  She had numbness in her thighs around 2010.    She had a known lumbar disc herniation and was told it may have been due to that.   She had an  EMG/NCV.      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   She has had urge incontinence that has been attributed to her weight.  She also has urinary hesitancy, especially at night.    She has OSA, HTN, elevated cholesterol, hypothyroidism.   She does not have DM (borderline HgbA1c though) and she does not smoke.    She has cardiac palpitations and had some PVCs noted.      I personally reviewed the MRI of the brain without contrast and the MRI of the brain with contrast that also included the sagittal FLAIR images.  There are about a dozen T2/FLAIR hyperintense foci includng one in the right  cerebellar hemisphere, one just right of midline in the pons, multiple periventricular white matter, some radially oriented to the ventricles, one juxtacortical white matter and a couple of deep white matter foci.  There was a normal enhancement pattern.  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  REVIEW OF SYSTEMS: Constitutional: No fevers, chills, sweats, or change in appetite Eyes: No visual changes, double vision, eye pain Ear, nose and throat: No hearing loss, ear pain, nasal congestion, sore throat Cardiovascular: No chest pain, palpitations Respiratory: No shortness of breath at rest or with exertion.   No wheezes GastrointestinaI: No nausea, vomiting, diarrhea, abdominal pain, fecal incontinence Genitourinary:As above Musculoskeletal: No neck pain, back pain Integumentary: No rash, pruritus, skin lesions Neurological: as above Psychiatric: No depression at this time.  No anxiety Endocrine: No palpitations, diaphoresis, change in appetite, change in weigh or increased thirst Hematologic/Lymphatic: No anemia, purpura, petechiae. Allergic/Immunologic: No itchy/runny eyes, nasal congestion, recent allergic reactions, rashes  ALLERGIES: Allergies  Allergen Reactions  . Shellfish Allergy Hives    HOME MEDICATIONS:  Current Outpatient Medications:  .  amLODipine (NORVASC) 5 MG tablet, Take 1 tablet (5 mg total) by mouth daily., Disp: 90 tablet, Rfl: 1 .  Cholecalciferol (VITAMIN D3) 2000 UNITS capsule, Take 2,000 Units by mouth daily. , Disp: , Rfl:  .  DEXILANT 60 MG capsule, TAKE ONE CAPSULE BY MOUTH ONCE DAILY, Disp: 30 capsule, Rfl: 11 .  DULoxetine (CYMBALTA) 30 MG capsule, TAKE 3 CAPSULES BY MOUTH ONCE DAILY, Disp: 90 capsule, Rfl: 1 .  ferrous sulfate 325 (65 FE) MG tablet, Take 325 mg by mouth every evening. , Disp: , Rfl:  .  gabapentin (NEURONTIN) 100 MG capsule, Take 1 capsule (100 mg total) by mouth 2 (two)  times daily., Disp: 180 capsule, Rfl: 1 .  Glucosamine Sulfate-MSM (MSM/GLUCOSAMINE) 250-250 MG CAPS, Take 1 tablet by mouth 2 (two) times daily. , Disp: , Rfl:  .  levothyroxine (SYNTHROID, LEVOTHROID) 150 MCG tablet, TAKE 1 TABLET BY MOUTH ONCE DAILY BEFORE BREAKFAST, Disp: 90 tablet, Rfl: 1 .  phentermine (ADIPEX-P) 37.5 MG tablet, Take 1 tablet (37.5 mg total) by mouth daily before breakfast., Disp: 30 tablet, Rfl: 2 .  sucralfate (CARAFATE) 1 g tablet, Take 1 g by mouth 4 (four) times daily., Disp: , Rfl:  .  temazepam (RESTORIL) 30 MG capsule, TAKE 1 CAPSULE BY MOUTH AT BEDTIME AS NEEDED FOR SLEEP, Disp: 30 capsule, Rfl: 5 .  vitamin C (ASCORBIC ACID) 500 MG tablet, Take 500 mg by mouth daily., Disp: , Rfl:  .  Dimethyl Fumarate (TECFIDERA) 120 & 240 MG MISC, Take by mouth., Disp: , Rfl:  .  Dimethyl Fumarate (TECFIDERA) 240 MG CPDR, Take 240 mg  by mouth 2 (two) times daily., Disp: , Rfl:   PAST MEDICAL HISTORY: Past Medical History:  Diagnosis Date  . Agoraphobia with panic disorder   . Anemia   . Depression   . Erosive gastritis   . Fundic gland polyps of stomach, benign   . Geographic tongue   . GERD (gastroesophageal reflux disease)   . History of esophagogastroduodenoscopy (EGD)   . Hypercholesterolemia   . Hypertension   . Intervertebral disc disorder    thoracic,thoracolumber,lumbosacral  . Obesity   . Sleep apnea     PAST SURGICAL HISTORY: Past Surgical History:  Procedure Laterality Date  . COLONOSCOPY    . COLONOSCOPY WITH PROPOFOL N/A 07/07/2018   Procedure: COLONOSCOPY WITH PROPOFOL;  Surgeon: Lollie Sails, MD;  Location: [facility] ENDOSCOPY;  Service: Endoscopy;  Laterality: N/A;  . ESOPHAGOGASTRODUODENOSCOPY (EGD) WITH PROPOFOL N/A 01/31/2016   Procedure: ESOPHAGOGASTRODUODENOSCOPY (EGD) WITH PROPOFOL;  Surgeon: Lollie Sails, MD;  Location: [facility] ENDOSCOPY;  Service: Endoscopy;  Laterality: N/A;  . NISSEN FUNDOPLICATION      FAMILY HISTORY: Family  History  Problem Relation Age of Onset  . Lung cancer Mother   . Hypertension Mother   . Alcohol abuse Mother   . Anxiety disorder Mother   . Hypertension Father   . Arthritis Father   . Alcohol abuse Father   . Esophageal cancer Father   . Alcohol abuse Brother   . Hypertension Brother   . Autism Son   . Coronary artery disease Maternal Grandfather   . Heart attack Maternal Grandfather   . CVA Paternal Grandmother   . Skin cancer Paternal Grandmother   . Cancer Maternal Grandmother     SOCIAL HISTORY:  Social History   Socioeconomic History  . Marital status: Married    Spouse name: Not on file  . Number of children: Not on file  . Years of education: Not on file  . Highest education level: Not on file  Occupational History  . Not on file  Social Needs  . Financial resource strain: Not on file  . Food insecurity:    Worry: Not on file    Inability: Not on file  . Transportation needs:    Medical: Not on file    Non-medical: Not on file  Tobacco Use  . Smoking status: Never Smoker  . Smokeless tobacco: Never Used  Substance and Sexual Activity  . Alcohol use: No    Alcohol/week: 0.0 oz  . Drug use: No  . Sexual activity: Not on file    Comment: Married   Lifestyle  . Physical activity:    Days per week: Not on file    Minutes per session: Not on file  . Stress: Not on file  Relationships  . Social connections:    Talks on phone: Not on file    Gets together: Not on file    Attends religious service: Not on file    Active member of club or organization: Not on file    Attends meetings of clubs or organizations: Not on file    Relationship status: Not on file  . Intimate partner violence:    Fear of current or ex partner: Not on file    Emotionally abused: Not on file    Physically abused: Not on file    Forced sexual activity: Not on file  Other Topics Concern  . Not on file  Social History Narrative  . Not on file     PHYSICAL EXAM  Vitals:     07/08/18 1124  BP: (!) 139/91  Pulse: 80  Resp: 18  Weight: 289 lb (131.1 kg)  Height: 5\' 6"  (1.676 m)    Body mass index is 46.65 kg/m.   General: The patient is well-developed and well-nourished and in no acute distress.  She has tenderness over the occiput on the left   Neurologic Exam  Mental status: The patient is alert and oriented x 3 at the time of the examination. The patient has apparent normal recent and remote memory, with an apparently normal attention span and concentration ability.   Speech is normal.  Cranial nerves: Extraocular movements are full.     Color vision was normal and symmetric.  Facial strength and sensation was normal.  Trapezius and sternocleidomastoid strength is normal. No dysarthria is noted.  The tongue is midline, and the patient has symmetric elevation of the soft palate. No obvious hearing deficits are noted.  Motor:  Muscle bulk is normal.   Tone is normal. Strength is  5 / 5 in all 4 extremities.   Sensory: Sensory testing is intact to pinprick, soft touch and vibration sensation in all 4 extremities.  Coordination: Cerebellar testing reveals good finger-nose-finger and heel-to-shin bilaterally.  Gait and station: Station is normal.   The gait and tandem gait are normal.  Romberg is negative.   Reflexes: Deep tendon reflexes are symmetric and normal bilaterally.   Plantar responses are flexor.    DIAGNOSTIC DATA (LABS, IMAGING, TESTING) - I reviewed patient records, labs, notes, testing and imaging myself where available.  Lab Results  Component Value Date   WBC 6.3 02/03/2018   HGB 12.5 02/03/2018   HCT 40.0 02/03/2018   MCV 77 (L) 02/03/2018   PLT 285 02/03/2018      Component Value Date/Time   NA 139 02/03/2018 1513   K 4.3 02/03/2018 1513   CL 103 02/03/2018 1513   CO2 20 02/03/2018 1513   GLUCOSE 84 02/03/2018 1513   BUN 19 02/03/2018 1513   CREATININE 1.00 02/03/2018 1513   CALCIUM 9.5 02/03/2018 1513   PROT 7.6  02/03/2018 1513   ALBUMIN 4.5 02/03/2018 1513   AST 16 02/03/2018 1513   ALT 18 02/03/2018 1513   ALKPHOS 79 02/03/2018 1513   BILITOT <0.2 02/03/2018 1513   GFRNONAA 64 02/03/2018 1513   GFRAA 74 02/03/2018 1513   Lab Results  Component Value Date   CHOL 197 02/03/2018   HDL 41 02/03/2018   LDLCALC 124 (H) 02/03/2018   TRIG 161 (H) 02/03/2018   CHOLHDL 5.5 (H) 09/28/2015   Lab Results  Component Value Date   HGBA1C 5.7 (H) 02/03/2018   No results found for: VITAMINB12 Lab Results  Component Value Date   TSH 2.460 02/03/2018       ASSESSMENT AND PLAN  Nonintractable headache, unspecified chronicity pattern, unspecified headache type  Multiple sclerosis (HCC)  Urge incontinence  Neck pain  1.   She is a new diagnosis of MS based on neurologic symptoms and abnormal brain and spine MRI.   We had a long conversation discussion different options.  We spent most of the time going over [place] as to oral options. 2.   She was most interested in starting Tecfidera and we will complete the service request form.  CBC 01/2018 showed lymphocytre count of 1.8 3.   TPI 80 mg depo-medrol in 3 cc Marcaine of the left splenius capitus muscle using sterile technique.   She  tolerated it well.   4.    Return to see me in 3-4 months or sooner if there are new or worsening neurologic symptoms.  Richard A. Felecia Shelling, MD, PhD, FAAN Certified in Neurology, Clinical Neurophysiology, Sleep Medicine, Pain Medicine and Neuroimaging Director, [place] [facility] [address], [place], [place] [address] [phone]

## 2018-07-09 LAB — SURGICAL PATHOLOGY

## 2018-07-15 ENCOUNTER — Other Ambulatory Visit: Payer: Self-pay | Admitting: Physician Assistant

## 2018-07-15 DIAGNOSIS — F331 Major depressive disorder, recurrent, moderate: Secondary | ICD-10-CM

## 2018-07-15 MED ORDER — DULOXETINE HCL 30 MG PO CPEP: 30.0000 mg | ORAL_CAPSULE | Freq: Every day | ORAL | 3 refills | Status: DC

## 2018-07-15 MED ORDER — DULOXETINE HCL 60 MG PO CPEP: 60.0000 mg | ORAL_CAPSULE | Freq: Every day | ORAL | 1 refills | Status: DC

## 2018-07-15 NOTE — Progress Notes (Signed)
Updated duloxetine 30mg  TID not covered by insurance

## 2018-07-22 ENCOUNTER — Encounter: Payer: Self-pay | Admitting: Physician Assistant

## 2018-07-22 ENCOUNTER — Ambulatory Visit: Payer: 59 | Admitting: Physician Assistant

## 2018-07-22 VITALS — BP 122/70 | HR 84 | Temp 98.2°F | Resp 16 | Wt 288.0 lb

## 2018-07-22 DIAGNOSIS — F331 Major depressive disorder, recurrent, moderate: Secondary | ICD-10-CM

## 2018-07-22 DIAGNOSIS — G35 Multiple sclerosis: Secondary | ICD-10-CM

## 2018-07-22 NOTE — Progress Notes (Signed)
Patient: Patricia Deleon Female    DOB: 09/08/1961   57 y.o.   MRN: [id number] Visit Date: 07/22/2018  Today's Provider: Mar Daring, PA-C   Chief Complaint  Patient presents with  . Follow-up    Discuss recent Dx of MS   Subjective:    HPI  Patient here today to consult regarding recent dx of Multiple Sclerosis. Patient had MRI done. Patient saw the Neurologist on 07/09 per Dr notes new diagnosis of MS based on neurologic symptoms and abnormal brain and spine MRI. She feels she is doing ok. She is starting to research the disease. She is going to join support groups. No true neurological deficit at this time. She is requesting a new referral for psychiatry and counseling to help with diagnosis. Was a previous patient of Dr. Waylan Boga but she no longer carried her insurance plan.     Allergies  Allergen Reactions  . Shellfish Allergy Hives     Current Outpatient Medications:  .  amLODipine (NORVASC) 5 MG tablet, Take 1 tablet (5 mg total) by mouth daily., Disp: 90 tablet, Rfl: 1 .  Cholecalciferol (VITAMIN D3) 2000 UNITS capsule, Take 2,000 Units by mouth daily. , Disp: , Rfl:  .  DEXILANT 60 MG capsule, TAKE ONE CAPSULE BY MOUTH ONCE DAILY, Disp: 30 capsule, Rfl: 11 .  Dimethyl Fumarate (TECFIDERA) 120 & 240 MG MISC, Take by mouth., Disp: , Rfl:  .  Dimethyl Fumarate (TECFIDERA) 240 MG CPDR, Take 240 mg by mouth 2 (two) times daily., Disp: , Rfl:  .  DULoxetine (CYMBALTA) 30 MG capsule, Take 1 capsule (30 mg total) by mouth daily. Take with 60mg , Disp: 90 capsule, Rfl: 3 .  DULoxetine (CYMBALTA) 60 MG capsule, Take 1 capsule (60 mg total) by mouth daily. Take with 30mg , Disp: 90 capsule, Rfl: 1 .  ferrous sulfate 325 (65 FE) MG tablet, Take 325 mg by mouth every evening. , Disp: , Rfl:  .  gabapentin (NEURONTIN) 100 MG capsule, Take 1 capsule (100 mg total) by mouth 2 (two) times daily., Disp: 180 capsule, Rfl: 1 .  Glucosamine Sulfate-MSM (MSM/GLUCOSAMINE)  250-250 MG CAPS, Take 1 tablet by mouth 2 (two) times daily. , Disp: , Rfl:  .  levothyroxine (SYNTHROID, LEVOTHROID) 150 MCG tablet, TAKE 1 TABLET BY MOUTH ONCE DAILY BEFORE BREAKFAST, Disp: 90 tablet, Rfl: 1 .  temazepam (RESTORIL) 30 MG capsule, TAKE 1 CAPSULE BY MOUTH AT BEDTIME AS NEEDED FOR SLEEP, Disp: 30 capsule, Rfl: 5 .  vitamin C (ASCORBIC ACID) 500 MG tablet, Take 500 mg by mouth daily., Disp: , Rfl:  .  phentermine (ADIPEX-P) 37.5 MG tablet, Take 1 tablet (37.5 mg total) by mouth daily before breakfast. (Patient not taking: Reported on 07/22/2018), Disp: 30 tablet, Rfl: 2 .  sucralfate (CARAFATE) 1 g tablet, Take 1 g by mouth 4 (four) times daily., Disp: , Rfl:   Review of Systems  Constitutional: Negative.   Respiratory: Negative.   Cardiovascular: Negative.   Gastrointestinal: Negative.   Neurological: Positive for headaches.  Psychiatric/Behavioral: Positive for dysphoric mood. The patient is nervous/anxious.     Social History   Tobacco Use  . Smoking status: Never Smoker  . Smokeless tobacco: Never Used  Substance Use Topics  . Alcohol use: No    Alcohol/week: 0.0 oz   Objective:   BP 122/70 (BP Location: Right Arm, Patient Position: Sitting, Cuff Size: Large)   Pulse 84   Temp 98.2 F (36.8 C) (  Oral)   Resp 16   Wt 288 lb (130.6 kg)   LMP  (LMP Unknown) Comment: preg test negative  BMI 46.48 kg/m  Vitals:   07/22/18 1623  BP: 122/70  Pulse: 84  Resp: 16  Temp: 98.2 F (36.8 C)  TempSrc: Oral  Weight: 288 lb (130.6 kg)     Physical Exam  Constitutional: She appears well-developed and well-nourished. No distress.  Neck: Normal range of motion. Neck supple.  Cardiovascular: Normal rate, regular rhythm and normal heart sounds. Exam reveals no gallop and no friction rub.  No murmur heard. Pulmonary/Chest: Effort normal and breath sounds normal. No respiratory distress. She has no wheezes. She has no rales.  Skin: She is not diaphoretic.  Psychiatric:  She has a normal mood and affect. Her behavior is normal. Judgment and thought content normal.  Vitals reviewed.       Assessment & Plan:     1. MS (multiple sclerosis) ([place]) Newly diagnosed. Followed by Dr. Felecia Shelling, Neurology. Referrals placed for new counseling and psychiatry that may be in her network. She is to call if she needs anything in the meantime.  - Ambulatory referral to Psychiatry - Ambulatory referral to Psychology  2. Moderate episode of recurrent major depressive disorder (HCC) Currently on cymbalta 90mg  and temazepam 30mg  for sleep. See above medical treatment plan. - Ambulatory referral to Psychiatry - Ambulatory referral to Psychology       Mar Daring, PA-C  [facility]

## 2018-07-24 ENCOUNTER — Encounter: Payer: Self-pay | Admitting: Physician Assistant

## 2018-08-15 ENCOUNTER — Other Ambulatory Visit: Payer: Self-pay | Admitting: Physician Assistant

## 2018-08-15 DIAGNOSIS — E039 Hypothyroidism, unspecified: Secondary | ICD-10-CM

## 2018-08-25 ENCOUNTER — Encounter: Payer: Self-pay | Admitting: Neurology

## 2018-08-25 ENCOUNTER — Telehealth: Payer: Self-pay | Admitting: Neurology

## 2018-08-25 ENCOUNTER — Ambulatory Visit: Payer: 59 | Admitting: Neurology

## 2018-08-25 ENCOUNTER — Other Ambulatory Visit: Payer: Self-pay

## 2018-08-25 VITALS — BP 124/82 | HR 78 | Resp 18 | Ht 66.0 in | Wt 289.0 lb

## 2018-08-25 DIAGNOSIS — G4719 Other hypersomnia: Secondary | ICD-10-CM | POA: Diagnosis not present

## 2018-08-25 DIAGNOSIS — G4733 Obstructive sleep apnea (adult) (pediatric): Secondary | ICD-10-CM

## 2018-08-25 DIAGNOSIS — H469 Unspecified optic neuritis: Secondary | ICD-10-CM | POA: Diagnosis not present

## 2018-08-25 DIAGNOSIS — G35 Multiple sclerosis: Secondary | ICD-10-CM | POA: Diagnosis not present

## 2018-08-25 MED ORDER — ARMODAFINIL 200 MG PO TABS: ORAL_TABLET | ORAL | 5 refills | Status: DC

## 2018-08-25 NOTE — Telephone Encounter (Signed)
Pt's had a HA for the past 3 days, pain behind the right eye, she feels like the she is loosing vision in the right eye, kind of like tunnel vision. Please call to advise

## 2018-08-25 NOTE — Telephone Encounter (Signed)
Spoke with [employer].  She c/o onset of right eye pain/h/a, decreased vision 3 days ago/fim

## 2018-08-25 NOTE — Progress Notes (Signed)
[facility]  PATIENT: Patricia Deleon DOB: 1961/04/26  REFERRING DOCTOR OR PCP:  Fenton Malling, PA-C SOURCE: patient, ED notes, laboratory and imaging reports, MRI images on PACS.  _________________________________   HISTORICAL  CHIEF COMPLAINT:  Chief Complaint  Patient presents with  . Multiple Sclerosis    Sts. she is tolerating Tecfidera well and has not missed any doses.  Today she c/o decreased vision (upper, outer peripheral vision) right eye pain,(esp with looking up),  h/a onset 3 days ago. She also has increased fatigue, would like to discuss starting Nuvigil or Provigil. (also has OSA)/fim    HISTORY OF PRESENT ILLNESS:  Patricia Deleon is a 57 yo woman with episodes of visual loss, left-sided facial numbness with abnormal brain and spine MRI c/w MS.  Update 08/25/2018: On Saturday, she had a swollen sensation in her right eye.   Yesterday (Sunday) the eye hurt with movements.  She noted some mild right vision change.   This morning the VA is worse in the right eye.   The upper visual field seems to have a veil covering it.    Colors are desaturated.  She has been on Tecfidera for the past 5 to 6 weeks.  She is tolerating it well.  She denies any change in her gait, strength, sensation or bladder.  She has noted more fatigue.   She has OSA treated with CPAP x 17 years.    She is sleepy in the afternoons and evenings.  She usually sleeps well at night.  EPWORTH SLEEPINESS SCALE  On a scale of 0 - 3 what is the chance of dozing:  Sitting and Reading:   3 Watching TV:    3 Sitting inactive in a public place: 1 Passenger in car for one hour: 3 Lying down to rest in the afternoon: 3 Sitting and talking to someone: 0 Sitting quietly after lunch:  2 In a car, stopped in traffic:  0  Total (out of 24):   15/24 moderate ESS.   Update 07/08/2018: I personally reviewed the MRIs of the cervical and thoracic spine performed last week and the MRI  of the brain from last month and showed the images to Mrs. Eakes.   The MRI of the cervical spine shows a focus to the right at C4 and possibly another focus midline at C3. The MRI of the thoracic spine shows midline foci at T5-T6 and T6-T7 and midline to the right at T7-T8.  We discussed that the combination of the brain and spine MRIs help [place] to diagnose her with multiple sclerosis.  I feel that she should be on a disease modifying therapy.  We had a long discussion about disease modifying therapies for MS.  She is most interested in the oral agents at this time but would be interested in switching to more efficacious intravenous medication if she needs to in the future if there is any breakthrough.  She has continued to have some intermittent episodes of visual symptoms followed by headache.  Most of the visual symptoms occur toward the left but one occurred on the right recently.   Additionally, she has had more constant neck pain and back of the head pain on the left over the past couple weeks.  She feels her bladder symptoms are doing about the same.  From 06/18/2018: About 20 years ago, she had an episode of left visual disturbance x 20 minutes like a disc enlarging in her vision.   This has happened  a few other times lasting 5 minutes or so.   Then, she had complete loss of vision (bilateral) for 5-10 seconds and partial visual loss for 5-10 minutes.    Additionally, she was having pain in the left eye and left side of her head going to the neck.   This started 2 weeks ago but worsened 06/14/2018.   Additionally, she had a couple hours of left sided facial numbness.     She went to the emergency room 06/14/2018.     From the ED, the case was discussed with inpatient neurology and an MRI of the brain was obtained.  It was concerning for demyelination showing multiple T2/FLAIR hyperintense foci in the right inferior cerebellum, posterior pons and periventricular white matter.   She was brought  back for sagittal FLAIR images and postcontrast images.  There was an additional juxtacortical lesion in the left frontal lobe.  None of the foci enhanced.  Earlier this year she had some blood work and the vitamin D was mildly low (25).  Hemoglobin A1c was borderline elevated.  CMP was normal.  TSH was normal.  CBC did not show anemia though the MCV was mildly reduced the RDW was mildly widened..  The LDL was mildly elevated (124) HIV and hepatitis C were negative.  She had numbness in her thighs around 2010.    She had a known lumbar disc herniation and was told it may have been due to that.   She had an EMG/NCV.      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   She has had urge incontinence that has been attributed to her weight.  She also has urinary hesitancy, especially at night.    She has OSA, HTN, elevated cholesterol, hypothyroidism.   She does not have DM (borderline HgbA1c though) and she does not smoke.    She has cardiac palpitations and had some PVCs noted.      I personally reviewed the MRI of the brain without contrast and the MRI of the brain with contrast that also included the sagittal FLAIR images.  There are about a dozen T2/FLAIR hyperintense foci includng one in the right cerebellar hemisphere, one just right of midline in the pons, multiple periventricular white matter, some radially oriented to the ventricles, one juxtacortical white matter and a couple of deep white matter foci.  There was a normal enhancement pattern.  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  REVIEW OF SYSTEMS: Constitutional: No fevers, chills, sweats, or change in appetite Eyes: No visual changes, double vision, eye pain Ear, nose and throat: No hearing loss, ear pain, nasal congestion, sore throat Cardiovascular: No chest pain, palpitations Respiratory: No shortness of breath at rest or with exertion.   No  wheezes GastrointestinaI: No nausea, vomiting, diarrhea, abdominal pain, fecal incontinence Genitourinary:As above Musculoskeletal: No neck pain, back pain Integumentary: No rash, pruritus, skin lesions Neurological: as above Psychiatric: No depression at this time.  No anxiety Endocrine: No palpitations, diaphoresis, change in appetite, change in weigh or increased thirst Hematologic/Lymphatic: No anemia, purpura, petechiae. Allergic/Immunologic: No itchy/runny eyes, nasal congestion, recent allergic reactions, rashes  ALLERGIES: Allergies  Allergen Reactions  . Shellfish Allergy Hives    HOME MEDICATIONS:  Current Outpatient Medications:  .  amLODipine (NORVASC) 5 MG tablet, Take 1 tablet (5 mg total) by mouth daily., Disp: 90 tablet, Rfl: 1 .  Cholecalciferol (  VITAMIN D3) 2000 UNITS capsule, Take 2,000 Units by mouth daily. , Disp: , Rfl:  .  DEXILANT 60 MG capsule, TAKE ONE CAPSULE BY MOUTH ONCE DAILY, Disp: 30 capsule, Rfl: 11 .  Dimethyl Fumarate (TECFIDERA) 240 MG CPDR, Take 240 mg by mouth 2 (two) times daily., Disp: , Rfl:  .  DULoxetine (CYMBALTA) 30 MG capsule, Take 1 capsule (30 mg total) by mouth daily. Take with 60mg , Disp: 90 capsule, Rfl: 3 .  DULoxetine (CYMBALTA) 60 MG capsule, Take 1 capsule (60 mg total) by mouth daily. Take with 30mg , Disp: 90 capsule, Rfl: 1 .  ferrous sulfate 325 (65 FE) MG tablet, Take 325 mg by mouth every evening. , Disp: , Rfl:  .  gabapentin (NEURONTIN) 100 MG capsule, Take 1 capsule (100 mg total) by mouth 2 (two) times daily., Disp: 180 capsule, Rfl: 1 .  Glucosamine Sulfate-MSM (MSM/GLUCOSAMINE) 250-250 MG CAPS, Take 1 tablet by mouth 2 (two) times daily. , Disp: , Rfl:  .  levothyroxine (SYNTHROID, LEVOTHROID) 150 MCG tablet, TAKE 1 TABLET BY MOUTH ONCE DAILY BEFORE BREAKFAST, Disp: 90 tablet, Rfl: 1 .  phentermine (ADIPEX-P) 37.5 MG tablet, Take 1 tablet (37.5 mg total) by mouth daily before breakfast., Disp: 30 tablet, Rfl: 2 .   Polyethylene Glycol 3350 (MIRALAX PO), Take by mouth., Disp: , Rfl:  .  sucralfate (CARAFATE) 1 g tablet, Take 1 g by mouth 4 (four) times daily., Disp: , Rfl:  .  temazepam (RESTORIL) 30 MG capsule, TAKE 1 CAPSULE BY MOUTH AT BEDTIME AS NEEDED FOR SLEEP, Disp: 30 capsule, Rfl: 5 .  vitamin C (ASCORBIC ACID) 500 MG tablet, Take 500 mg by mouth daily., Disp: , Rfl:  .  Armodafinil (NUVIGIL) 200 MG TABS, One po qAM, Disp: 30 tablet, Rfl: 5  PAST MEDICAL HISTORY: Past Medical History:  Diagnosis Date  . Agoraphobia with panic disorder   . Anemia   . Depression   . Erosive gastritis   . Fundic gland polyps of stomach, benign   . Geographic tongue   . GERD (gastroesophageal reflux disease)   . History of esophagogastroduodenoscopy (EGD)   . Hypercholesterolemia   . Hypertension   . Intervertebral disc disorder    thoracic,thoracolumber,lumbosacral  . Obesity   . Sleep apnea     PAST SURGICAL HISTORY: Past Surgical History:  Procedure Laterality Date  . COLONOSCOPY    . COLONOSCOPY WITH PROPOFOL N/A 07/07/2018   Procedure: COLONOSCOPY WITH PROPOFOL;  Surgeon: Lollie Sails, MD;  Location: [facility] ENDOSCOPY;  Service: Endoscopy;  Laterality: N/A;  . ESOPHAGOGASTRODUODENOSCOPY (EGD) WITH PROPOFOL N/A 01/31/2016   Procedure: ESOPHAGOGASTRODUODENOSCOPY (EGD) WITH PROPOFOL;  Surgeon: Lollie Sails, MD;  Location: [facility] ENDOSCOPY;  Service: Endoscopy;  Laterality: N/A;  . NISSEN FUNDOPLICATION      FAMILY HISTORY: Family History  Problem Relation Age of Onset  . Lung cancer Mother   . Hypertension Mother   . Alcohol abuse Mother   . Anxiety disorder Mother   . Hypertension Father   . Arthritis Father   . Alcohol abuse Father   . Esophageal cancer Father   . Alcohol abuse Brother   . Hypertension Brother   . Autism Son   . Coronary artery disease Maternal Grandfather   . Heart attack Maternal Grandfather   . CVA Paternal Grandmother   . Skin cancer Paternal Grandmother   .  Cancer Maternal Grandmother     SOCIAL HISTORY:  Social History   Socioeconomic History  . Marital status:  Married    Spouse name: Not on file  . Number of children: Not on file  . Years of education: Not on file  . Highest education level: Not on file  Occupational History  . Not on file  Social Needs  . Financial resource strain: Not on file  . Food insecurity:    Worry: Not on file    Inability: Not on file  . Transportation needs:    Medical: Not on file    Non-medical: Not on file  Tobacco Use  . Smoking status: Never Smoker  . Smokeless tobacco: Never Used  Substance and Sexual Activity  . Alcohol use: No    Alcohol/week: 0.0 standard drinks  . Drug use: No  . Sexual activity: Not on file    Comment: Married   Lifestyle  . Physical activity:    Days per week: Not on file    Minutes per session: Not on file  . Stress: Not on file  Relationships  . Social connections:    Talks on phone: Not on file    Gets together: Not on file    Attends religious service: Not on file    Active member of club or organization: Not on file    Attends meetings of clubs or organizations: Not on file    Relationship status: Not on file  . Intimate partner violence:    Fear of current or ex partner: Not on file    Emotionally abused: Not on file    Physically abused: Not on file    Forced sexual activity: Not on file  Other Topics Concern  . Not on file  Social History Narrative  . Not on file     PHYSICAL EXAM  Vitals:   08/25/18 1416  BP: 124/82  Pulse: 78  Resp: 18  Weight: 289 lb (131.1 kg)  Height: 5\' 6"  (1.676 m)    Body mass index is 46.65 kg/m.   General: The patient is well-developed and well-nourished and in no acute distress.     Neurologic Exam  Mental status: The patient is alert and oriented x 3 at the time of the examination. The patient has apparent normal recent and remote memory, with an apparently normal attention span and concentration  ability.   Speech is normal.  Cranial nerves: Extraocular movements are full.    She has reduced color vision on the right.  Visual acuity was 20/40 OD 20/30 OS.  Facial strength and sensation was normal.  The tongue is midline, and the patient has symmetric elevation of the soft palate. No obvious hearing deficits are noted.  Motor:  Muscle bulk is normal.   Tone is normal. Strength is  5 / 5 in all 4 extremities.   Sensory: Sensory testing is intact to pinprick, soft touch and vibration sensation in all 4 extremities.  Coordination: Cerebellar testing reveals good finger-nose-finger and heel-to-shin bilaterally.  Gait and station: Station is normal.   The gait and tandem gait are normal.  Romberg is negative.   Reflexes: Deep tendon reflexes are symmetric and normal bilaterally.        DIAGNOSTIC DATA (LABS, IMAGING, TESTING) - I reviewed patient records, labs, notes, testing and imaging myself where available.  Lab Results  Component Value Date   WBC 6.3 02/03/2018   HGB 12.5 02/03/2018   HCT 40.0 02/03/2018   MCV 77 (L) 02/03/2018   PLT 285 02/03/2018      Component Value Date/Time   NA 139  02/03/2018 1513   K 4.3 02/03/2018 1513   CL 103 02/03/2018 1513   CO2 20 02/03/2018 1513   GLUCOSE 84 02/03/2018 1513   BUN 19 02/03/2018 1513   CREATININE 1.00 02/03/2018 1513   CALCIUM 9.5 02/03/2018 1513   PROT 7.6 02/03/2018 1513   ALBUMIN 4.5 02/03/2018 1513   AST 16 02/03/2018 1513   ALT 18 02/03/2018 1513   ALKPHOS 79 02/03/2018 1513   BILITOT <0.2 02/03/2018 1513   GFRNONAA 64 02/03/2018 1513   GFRAA 74 02/03/2018 1513   Lab Results  Component Value Date   CHOL 197 02/03/2018   HDL 41 02/03/2018   LDLCALC 124 (H) 02/03/2018   TRIG 161 (H) 02/03/2018   CHOLHDL 5.5 (H) 09/28/2015   Lab Results  Component Value Date   HGBA1C 5.7 (H) 02/03/2018   No results found for: VITAMINB12 Lab Results  Component Value Date   TSH 2.460 02/03/2018       ASSESSMENT AND  PLAN  Multiple sclerosis (HCC)  Obstructive sleep apnea syndrome  Right optic neuritis  Excessive daytime sleepiness   1.   She is experiencing an MS exacerbation with right optic neuritis.  She has only been on Tecfidera for 5 to 6 weeks so I think it is too early to say that she is having breakthrough activity.  We did discuss that if she had a second relapse that I would recommend a switch to different disease modifying therapy.  She will have 3 days of IV Solu-Medrol  One gram each day 2.   Nuvigil for his sleepiness associated with OSA MS. 3.    Return to see me in 4 months or sooner if there are new or worsening neurologic symptoms.  Fabion Gatson A. Felecia Shelling, MD, PhD, FAAN Certified in Neurology, Clinical Neurophysiology, Sleep Medicine, Pain Medicine and Neuroimaging Director, [place] at [facility] [address] [place], [place] [address] [phone]

## 2018-08-25 NOTE — Telephone Encounter (Signed)
Appt. given this afternoon with RAS/fim

## 2018-08-29 ENCOUNTER — Ambulatory Visit: Payer: 59 | Admitting: Psychiatry

## 2018-09-02 ENCOUNTER — Telehealth: Payer: Self-pay | Admitting: Neurology

## 2018-09-02 NOTE — Telephone Encounter (Signed)
[place] (identified vm)/fim

## 2018-09-02 NOTE — Telephone Encounter (Signed)
Patient states she had several infusions last week but she still cannot see out of her right eye. Please call and discuss.

## 2018-09-02 NOTE — Telephone Encounter (Signed)
Spoke with [facility] and explained that additional days of IV SM are not likely to help. The steroids she has already had (5 days of IV SM)  should continue to work to reduce edema, so she hopefully she will see benefit from them, even if it is delayed. She should continue f/u with ophthalmology. She verbalized understanding of same/fim

## 2018-09-03 ENCOUNTER — Other Ambulatory Visit
Admission: RE | Admit: 2018-09-03 | Discharge: 2018-09-03 | Disposition: A | Discharge status: Home / Self Care | Payer: 59 | Source: Ambulatory Visit | Attending: Ophthalmology | Admitting: Ophthalmology

## 2018-09-03 ENCOUNTER — Encounter: Payer: Self-pay | Admitting: Neurology

## 2018-09-03 DIAGNOSIS — G360 Neuromyelitis optica [Devic]: Secondary | ICD-10-CM | POA: Insufficient documentation

## 2018-09-05 LAB — MISC LABCORP TEST (SEND OUT): LABCORP TEST NAME: 4210

## 2018-09-05 LAB — NEUROMYELITIS OPTICA AUTOAB, IGG

## 2018-09-08 NOTE — Telephone Encounter (Signed)
Pt called stating she saw her ophthalmologist on 9/4 stating she should be getting lab work back around 9/11 or 9/12. Also stating she still unable to see out of her right eye. FYI

## 2018-09-09 NOTE — Telephone Encounter (Signed)
Pt called stating she was able to get into the ophthalmology today FYI

## 2018-09-09 NOTE — Telephone Encounter (Signed)
Spoke with [facility].  She saw Dr. Herbie Baltimore at [facility] on 09/03/18.  Sts. he confirmed right optic neuritis; thought she may have Devic's Dz. instead of MS, but NMO drawn that day was negative.  She still had some vision in right eye when she saw Dr. Herbie Baltimore. Sts. since then, vision has deteriorated so that now she can't see anything out of the right eye.  She had 5 days of IV SM in our office with minimal benefit.  I have advised she call Dr. Sherrian Divers office back today and request appt. today for re-eval of optic neuritis. She verbalized understanding of same, is agreeable with f/u with ophthalmology today. Dr. Felecia Shelling is out of the office until 09/11/18.  I will check with the on call physician (Dr. Brett Fairy) to see if there is additional action she would like me to take./fim

## 2018-09-09 NOTE — Telephone Encounter (Signed)
Noted/fim 

## 2018-09-14 ENCOUNTER — Other Ambulatory Visit: Payer: Self-pay | Admitting: Physician Assistant

## 2018-09-14 DIAGNOSIS — F331 Major depressive disorder, recurrent, moderate: Secondary | ICD-10-CM

## 2018-09-22 ENCOUNTER — Telehealth: Payer: Self-pay | Admitting: Neurology

## 2018-09-22 DIAGNOSIS — G35 Multiple sclerosis: Secondary | ICD-10-CM

## 2018-09-22 DIAGNOSIS — H469 Unspecified optic neuritis: Secondary | ICD-10-CM

## 2018-09-22 DIAGNOSIS — H547 Unspecified visual loss: Secondary | ICD-10-CM

## 2018-09-22 NOTE — Addendum Note (Signed)
Addended by: France Ravens I on: 09/22/2018 03:47 PM   Modules accepted: Orders

## 2018-09-22 NOTE — Telephone Encounter (Signed)
Patient saw Dr. Herbie Baltimore opthamologist and still has no vision in her right eye. Is there anything she can do and did Dr. Felecia Shelling receive records from Dr. Herbie Baltimore. Please call and discuss.

## 2018-09-22 NOTE — Telephone Encounter (Signed)
Plasmapheresis ordered per RAS v/o/fim

## 2018-09-22 NOTE — Telephone Encounter (Signed)
Phone rang, cut off--did not go to vm/fim

## 2018-09-22 NOTE — Telephone Encounter (Signed)
She still has no improvement after 2 rounds of IV Solu-Medrol.    Anti-NMO Ab is negative..  I called her to discuss plasma exchange as an option for her steroid unresponsive relapse.     Faith, please call to see how we should set this up for her    We will do 5 exchanges:  1 plasma volume exchange every other day x 5 exchanges.

## 2018-09-22 NOTE — Telephone Encounter (Signed)
I have spoken with [facility].  I have not seen any ophthalmology notes.  She will ask again for them to be faxed to [phone].  We discussed optic neuritis; her's has been treated with IV SoluMedrol. She is seeing ophthalmology. She is compliant with Tecfidera. There is not more that she can do.  Hopefully vision will begin to improve with some more time.  She is aware of the possibility that it may not return to baseline. She verbalized understanding of same/fim

## 2018-10-02 ENCOUNTER — Other Ambulatory Visit: Payer: Self-pay | Admitting: *Deleted

## 2018-10-02 ENCOUNTER — Telehealth: Payer: Self-pay | Admitting: Neurology

## 2018-10-02 DIAGNOSIS — H469 Unspecified optic neuritis: Secondary | ICD-10-CM

## 2018-10-02 DIAGNOSIS — G35 Multiple sclerosis: Secondary | ICD-10-CM

## 2018-10-02 DIAGNOSIS — H547 Unspecified visual loss: Secondary | ICD-10-CM

## 2018-10-02 NOTE — Telephone Encounter (Signed)
I have called and talked to patient patient is going for her catheter 10/ 06/2018 arrive at 7:00 am for check in . Patient needs to be NPO 12 Midnight Sunday .  I have called and explained all details to patient . Patient is aware.  Cath - [id number] [place]- [phone]

## 2018-10-02 NOTE — Telephone Encounter (Signed)
Pt has called for an update re: the procedure :plasmopharesis , she'd like to know if there has been an approval on this or not.  Please call

## 2018-10-02 NOTE — Telephone Encounter (Signed)
Noted/fim 

## 2018-10-03 ENCOUNTER — Other Ambulatory Visit: Payer: Self-pay | Admitting: Student

## 2018-10-03 ENCOUNTER — Other Ambulatory Visit: Payer: Self-pay | Admitting: Radiology

## 2018-10-06 ENCOUNTER — Other Ambulatory Visit: Payer: Self-pay | Admitting: Neurology

## 2018-10-06 ENCOUNTER — Ambulatory Visit (HOSPITAL_COMMUNITY)
Admission: RE | Admit: 2018-10-06 | Discharge: 2018-10-06 | Disposition: A | Discharge status: Home / Self Care | Payer: 59 | Source: Ambulatory Visit | Attending: Neurology | Admitting: Neurology

## 2018-10-06 ENCOUNTER — Encounter (HOSPITAL_COMMUNITY): Payer: Self-pay

## 2018-10-06 DIAGNOSIS — Z79890 Hormone replacement therapy (postmenopausal): Secondary | ICD-10-CM | POA: Insufficient documentation

## 2018-10-06 DIAGNOSIS — Z79899 Other long term (current) drug therapy: Secondary | ICD-10-CM | POA: Insufficient documentation

## 2018-10-06 DIAGNOSIS — F329 Major depressive disorder, single episode, unspecified: Secondary | ICD-10-CM | POA: Diagnosis not present

## 2018-10-06 DIAGNOSIS — Z91013 Allergy to seafood: Secondary | ICD-10-CM | POA: Insufficient documentation

## 2018-10-06 DIAGNOSIS — Z9889 Other specified postprocedural states: Secondary | ICD-10-CM | POA: Diagnosis not present

## 2018-10-06 DIAGNOSIS — E7800 Pure hypercholesterolemia, unspecified: Secondary | ICD-10-CM | POA: Insufficient documentation

## 2018-10-06 DIAGNOSIS — G35 Multiple sclerosis: Secondary | ICD-10-CM | POA: Diagnosis present

## 2018-10-06 DIAGNOSIS — I10 Essential (primary) hypertension: Secondary | ICD-10-CM | POA: Diagnosis not present

## 2018-10-06 DIAGNOSIS — H547 Unspecified visual loss: Secondary | ICD-10-CM | POA: Insufficient documentation

## 2018-10-06 DIAGNOSIS — Z8249 Family history of ischemic heart disease and other diseases of the circulatory system: Secondary | ICD-10-CM | POA: Insufficient documentation

## 2018-10-06 DIAGNOSIS — D649 Anemia, unspecified: Secondary | ICD-10-CM | POA: Diagnosis not present

## 2018-10-06 DIAGNOSIS — K219 Gastro-esophageal reflux disease without esophagitis: Secondary | ICD-10-CM | POA: Diagnosis not present

## 2018-10-06 DIAGNOSIS — H469 Unspecified optic neuritis: Secondary | ICD-10-CM | POA: Insufficient documentation

## 2018-10-06 DIAGNOSIS — Z7982 Long term (current) use of aspirin: Secondary | ICD-10-CM | POA: Diagnosis not present

## 2018-10-06 DIAGNOSIS — E669 Obesity, unspecified: Secondary | ICD-10-CM | POA: Diagnosis not present

## 2018-10-06 DIAGNOSIS — Z6841 Body Mass Index (BMI) 40.0 and over, adult: Secondary | ICD-10-CM | POA: Diagnosis not present

## 2018-10-06 DIAGNOSIS — G4733 Obstructive sleep apnea (adult) (pediatric): Secondary | ICD-10-CM | POA: Diagnosis not present

## 2018-10-06 HISTORY — PX: IR FLUORO GUIDE CV LINE RIGHT: IMG2283

## 2018-10-06 HISTORY — PX: IR US GUIDE VASC ACCESS RIGHT: IMG2390

## 2018-10-06 LAB — POCT I-STAT, CHEM 8
BUN: 12 mg/dL (ref 6–20)
CREATININE: 0.6 mg/dL (ref 0.44–1.00)
Calcium, Ion: 0.8 mmol/L — CL (ref 1.15–1.40)
Chloride: 111 mmol/L (ref 98–111)
Glucose, Bld: 75 mg/dL (ref 70–99)
HEMATOCRIT: 32 % — AB (ref 36.0–46.0)
Hemoglobin: 10.9 g/dL — ABNORMAL LOW (ref 12.0–15.0)
Potassium: 3.4 mmol/L — ABNORMAL LOW (ref 3.5–5.1)
Sodium: 141 mmol/L (ref 135–145)
TCO2: 22 mmol/L (ref 22–32)

## 2018-10-06 LAB — CBC
HCT: 44.9 % (ref 36.0–46.0)
HEMOGLOBIN: 14.8 g/dL (ref 12.0–15.0)
MCH: 29.7 pg (ref 26.0–34.0)
MCHC: 33 g/dL (ref 30.0–36.0)
MCV: 90.2 fL (ref 78.0–100.0)
Platelets: 169 10*3/uL (ref 150–400)
RBC: 4.98 MIL/uL (ref 3.87–5.11)
RDW: 14.1 % (ref 11.5–15.5)
WBC: 5.1 10*3/uL (ref 4.0–10.5)

## 2018-10-06 LAB — PROTIME-INR
INR: 1.03
Prothrombin Time: 13.4 s (ref 11.4–15.2)

## 2018-10-06 LAB — APTT: aPTT: 34 seconds (ref 24–36)

## 2018-10-06 MED ORDER — FENTANYL CITRATE (PF) 100 MCG/2ML IJ SOLN
INTRAMUSCULAR | Status: AC | PRN
  Administered 2018-10-06 (×2): 50 ug via INTRAVENOUS

## 2018-10-06 MED ORDER — CEFAZOLIN SODIUM-DEXTROSE 2-4 GM/100ML-% IV SOLN
INTRAVENOUS | Status: AC
  Filled 2018-10-06: qty 100

## 2018-10-06 MED ORDER — LIDOCAINE HCL (PF) 1 % IJ SOLN
INTRAMUSCULAR | Status: AC | PRN
  Administered 2018-10-06: 5 mL

## 2018-10-06 MED ORDER — SODIUM CHLORIDE 0.9 % IV SOLN
INTRAVENOUS | Status: AC
  Administered 2018-10-06 (×3): via INTRAVENOUS_CENTRAL
  Filled 2018-10-06 (×4): qty 200

## 2018-10-06 MED ORDER — CEFAZOLIN (ANCEF) 1 G IV SOLR
INTRAVENOUS | Status: AC | PRN
  Administered 2018-10-06: 2 g

## 2018-10-06 MED ORDER — HEPARIN SODIUM (PORCINE) 1000 UNIT/ML IJ SOLN: 1000.0000 [IU] | Freq: Once | INTRAMUSCULAR | Status: DC

## 2018-10-06 MED ORDER — HEPARIN SODIUM (PORCINE) 1000 UNIT/ML IJ SOLN
INTRAMUSCULAR | Status: AC
  Administered 2018-10-06: 3.2 mL
  Filled 2018-10-06: qty 1

## 2018-10-06 MED ORDER — MIDAZOLAM HCL 2 MG/2ML IJ SOLN
INTRAMUSCULAR | Status: AC | PRN
  Administered 2018-10-06 (×2): 1 mg via INTRAVENOUS

## 2018-10-06 MED ORDER — DIPHENHYDRAMINE HCL 25 MG PO CAPS: 25.0000 mg | ORAL_CAPSULE | Freq: Four times a day (QID) | ORAL | Status: DC | PRN

## 2018-10-06 MED ORDER — ACD FORMULA A 0.73-2.45-2.2 GM/100ML VI SOLN
500.0000 mL | Status: DC
  Filled 2018-10-06: qty 500

## 2018-10-06 MED ORDER — LIDOCAINE HCL 1 % IJ SOLN
INTRAMUSCULAR | Status: AC
  Filled 2018-10-06: qty 20

## 2018-10-06 MED ORDER — MIDAZOLAM HCL 2 MG/2ML IJ SOLN
INTRAMUSCULAR | Status: AC
  Filled 2018-10-06: qty 4

## 2018-10-06 MED ORDER — ACETAMINOPHEN 325 MG PO TABS: 650.0000 mg | ORAL_TABLET | ORAL | Status: DC | PRN

## 2018-10-06 MED ORDER — FENTANYL CITRATE (PF) 100 MCG/2ML IJ SOLN
INTRAMUSCULAR | Status: AC
  Filled 2018-10-06: qty 2

## 2018-10-06 MED ORDER — CEFAZOLIN SODIUM-DEXTROSE 2-4 GM/100ML-% IV SOLN: 2.0000 g | INTRAVENOUS | Status: DC

## 2018-10-06 MED ORDER — SODIUM CHLORIDE 0.9 % IV SOLN
INTRAVENOUS | Status: AC | PRN
  Administered 2018-10-06: 10 mL/h via INTRAVENOUS

## 2018-10-06 MED ORDER — CALCIUM CARBONATE ANTACID 500 MG PO CHEW
2.0000 | CHEWABLE_TABLET | ORAL | Status: AC
  Administered 2018-10-06: 400 mg via ORAL

## 2018-10-06 MED ORDER — SODIUM CHLORIDE 0.9 % IV SOLN: INTRAVENOUS | Status: DC

## 2018-10-06 MED ORDER — SODIUM CHLORIDE 0.9 % IV SOLN
2.0000 g | Freq: Once | INTRAVENOUS | Status: DC
  Filled 2018-10-06: qty 20

## 2018-10-06 NOTE — Procedures (Signed)
RIJV tunelled HD cath SVC RA 19 cm EBL 0 Comp 0

## 2018-10-06 NOTE — H&P (Signed)
Chief Complaint: Patient was seen in consultation today for plasmapheresis, multiple sclerosis  Referring Physician(s): Sater,Richard A  Supervising Physician: Marybelle Killings  Patient Status: [facility] - Out-pt  History of Present Illness: Patricia Deleon is a 57 y.o. female with past medical history of anemia, depression, GERD, HTN, sleep apnea who was recently diagnosed with MS in July 2019.  Since this time, she has had severe vision changes which were not helped with steroids alone.  She now has plans for plasmapheresis and is in need of access.   IR consulted for plasmapheresis catheter placement at the request of Dr. Felecia Shelling.  She presents today in her usual state of health. No infectious concerns. She has been NPO.  She does not take blood thinners.   Past Medical History:  Diagnosis Date  . Agoraphobia with panic disorder   . Anemia   . Depression   . Erosive gastritis   . Fundic gland polyps of stomach, benign   . Geographic tongue   . GERD (gastroesophageal reflux disease)   . History of esophagogastroduodenoscopy (EGD)   . Hypercholesterolemia   . Hypertension   . Intervertebral disc disorder    thoracic,thoracolumber,lumbosacral  . Obesity   . Sleep apnea     Past Surgical History:  Procedure Laterality Date  . COLONOSCOPY    . COLONOSCOPY WITH PROPOFOL N/A 07/07/2018   Procedure: COLONOSCOPY WITH PROPOFOL;  Surgeon: Lollie Sails, MD;  Location: [facility] ENDOSCOPY;  Service: Endoscopy;  Laterality: N/A;  . ESOPHAGOGASTRODUODENOSCOPY (EGD) WITH PROPOFOL N/A 01/31/2016   Procedure: ESOPHAGOGASTRODUODENOSCOPY (EGD) WITH PROPOFOL;  Surgeon: Lollie Sails, MD;  Location: [facility] ENDOSCOPY;  Service: Endoscopy;  Laterality: N/A;  . NISSEN FUNDOPLICATION      Allergies: Shellfish allergy  Medications: Prior to Admission medications   Medication Sig Start Date End Date Taking? Authorizing Provider  amLODipine (NORVASC) 5 MG tablet Take 1 tablet (5 mg total) by  mouth daily. 06/02/18  Yes Mar Daring, PA-C  aspirin 81 MG chewable tablet Chew 81 mg by mouth daily.   Yes [provider]  Cholecalciferol (VITAMIN D3) 2000 UNITS capsule Take 2,000 Units by mouth daily.    Yes [provider]  DEXILANT 60 MG capsule TAKE ONE CAPSULE BY MOUTH ONCE DAILY 01/20/18  Yes Burnette, Clearnce Sorrel, PA-C  Dimethyl Fumarate (TECFIDERA) 240 MG CPDR Take 240 mg by mouth 2 (two) times daily.   Yes [provider]  DULoxetine (CYMBALTA) 30 MG capsule TAKE 1 CAPSULE (30 MG TOTAL) BY MOUTH DAILY. TAKE WITH 60MG  09/15/18  Yes Burnette, Clearnce Sorrel, PA-C  DULoxetine (CYMBALTA) 60 MG capsule TAKE 1 CAPSULE (60 MG TOTAL) BY MOUTH DAILY. TAKE WITH 30MG  09/15/18  Yes Mar Daring, PA-C  ferrous sulfate 325 (65 FE) MG tablet Take 325 mg by mouth every evening.    Yes [provider]  gabapentin (NEURONTIN) 100 MG capsule Take 1 capsule (100 mg total) by mouth 2 (two) times daily. 06/02/18  Yes Mar Daring, PA-C  Glucosamine Sulfate-MSM (MSM/GLUCOSAMINE) 250-250 MG CAPS Take 1 tablet by mouth 2 (two) times daily.    Yes [provider]  levothyroxine (SYNTHROID, LEVOTHROID) 150 MCG tablet TAKE 1 TABLET BY MOUTH ONCE DAILY BEFORE BREAKFAST Patient taking differently: Take 150 mcg by mouth daily before breakfast.  08/15/18  Yes Burnette, Clearnce Sorrel, PA-C  Omega-3 Fatty Acids (FISH OIL) 500 MG CAPS Take by mouth.   Yes [provider]  polyethylene glycol powder (MIRALAX) powder Take 17 g  by mouth at bedtime.    Yes [provider]  temazepam (RESTORIL) 30 MG capsule TAKE 1 CAPSULE BY MOUTH AT BEDTIME AS NEEDED FOR SLEEP Patient taking differently: Take 30 mg by mouth at bedtime.  03/18/18  Yes Mar Daring, PA-C  vitamin C (ASCORBIC ACID) 500 MG tablet Take 500 mg by mouth daily.   Yes [provider]  Armodafinil (NUVIGIL) 200 MG TABS One po qAM Patient taking differently: Take 200 mg by mouth  daily as needed. One po qAM 08/25/18   Sater, Nanine Means, MD  sucralfate (CARAFATE) 1 g tablet Take 1 g by mouth 3 (three) times daily as needed (stomach pain).     [provider]     Family History  Problem Relation Age of Onset  . Lung cancer Mother   . Hypertension Mother   . Alcohol abuse Mother   . Anxiety disorder Mother   . Hypertension Father   . Arthritis Father   . Alcohol abuse Father   . Esophageal cancer Father   . Alcohol abuse Brother   . Hypertension Brother   . Autism Son   . Coronary artery disease Maternal Grandfather   . Heart attack Maternal Grandfather   . CVA Paternal Grandmother   . Skin cancer Paternal Grandmother   . Cancer Maternal Grandmother     Social History   Socioeconomic History  . Marital status: Married    Spouse name: Not on file  . Number of children: Not on file  . Years of education: Not on file  . Highest education level: Not on file  Occupational History  . Not on file  Social Needs  . Financial resource strain: Not on file  . Food insecurity:    Worry: Not on file    Inability: Not on file  . Transportation needs:    Medical: Not on file    Non-medical: Not on file  Tobacco Use  . Smoking status: Never Smoker  . Smokeless tobacco: Never Used  Substance and Sexual Activity  . Alcohol use: No    Alcohol/week: 0.0 standard drinks  . Drug use: No  . Sexual activity: Not on file    Comment: Married   Lifestyle  . Physical activity:    Days per week: Not on file    Minutes per session: Not on file  . Stress: Not on file  Relationships  . Social connections:    Talks on phone: Not on file    Gets together: Not on file    Attends religious service: Not on file    Active member of club or organization: Not on file    Attends meetings of clubs or organizations: Not on file    Relationship status: Not on file  Other Topics Concern  . Not on file  Social History Narrative  . Not on file     Review of  Systems: A 12 point ROS discussed and pertinent positives are indicated in the HPI above.  All other systems are negative.  Review of Systems  Constitutional: Negative for fatigue and fever.  Eyes: Positive for visual disturbance.  Respiratory: Negative for cough and shortness of breath.   Cardiovascular: Negative for chest pain.  Gastrointestinal: Negative for abdominal pain, diarrhea, nausea and vomiting.  Genitourinary: Negative for dysuria.  Musculoskeletal: Negative for back pain.  Psychiatric/Behavioral: Negative for behavioral problems and confusion.    Vital Signs: BP (!) 152/99   Pulse 77   Temp 98.1 F (  36.7 C) (Oral)   Resp 18   Ht 5\' 6"  (1.676 m)   Wt 292 lb (132.5 kg)   LMP 08/06/2018   SpO2 100%   BMI 47.13 kg/m   Physical Exam  Constitutional: She is oriented to person, place, and time. She appears well-developed. No distress.  Neck: Normal range of motion. Neck supple. No tracheal deviation present.  Cardiovascular: Normal rate, regular rhythm and normal heart sounds. Exam reveals no gallop and no friction rub.  No murmur heard. Pulmonary/Chest: Effort normal and breath sounds normal. No respiratory distress.  Abdominal: Soft. She exhibits no distension.  Lymphadenopathy:    She has no cervical adenopathy.  Neurological: She is alert and oriented to person, place, and time.  Skin: Skin is warm and dry. She is not diaphoretic.  Psychiatric: She has a normal mood and affect. Her behavior is normal. Judgment and thought content normal.  Nursing note and vitals reviewed.    MD Evaluation Airway: WNL Heart: WNL Abdomen: WNL Chest/ Lungs: WNL ASA  Classification: 3 Mallampati/Airway Score: One   Imaging: No results found.  Labs:  CBC: Recent Labs    02/03/18 1513 10/06/18 0720  WBC 6.3 5.1  HGB 12.5 14.8  HCT 40.0 44.9  PLT 285 169    COAGS: Recent Labs    10/06/18 0720  INR 1.03  APTT 34    BMP: Recent Labs    02/03/18 1513  NA  139  K 4.3  CL 103  CO2 20  GLUCOSE 84  BUN 19  CALCIUM 9.5  CREATININE 1.00  GFRNONAA 64  GFRAA 74    LIVER FUNCTION TESTS: Recent Labs    02/03/18 1513  BILITOT <0.2  AST 16  ALT 18  ALKPHOS 79  PROT 7.6  ALBUMIN 4.5    TUMOR MARKERS: No results for input(s): AFPTM, CEA, CA199, CHROMGRNA in the last 8760 hours.  Assessment and Plan: Patient with past medical history of HTN, GERD, OSA presents with complaint of recent diagnosis of MS with vision changes.  IR consulted for plasmapheresis catheter placement at the request of Dr. Felecia Shelling. Case reviewed by Dr. Barbie Banner who approves patient for procedure.  Patient presents today in their usual state of health.  She has been NPO and is not currently on blood thinners.   Risks and benefits discussed with the patient including, but not limited to bleeding, infection, vascular injury, pneumothorax which may require chest tube placement, air embolism or even death  All of the patient's questions were answered, patient is agreeable to proceed. Consent signed and in chart.  Thank you for this interesting consult.  I greatly enjoyed meeting [employer] and look forward to participating in their care.  A copy of this report was sent to the requesting provider on this date.  Electronically Signed: Docia Barrier, PA 10/06/2018, 8:26 AM   I spent a total of  30 Minutes   in face to face in clinical consultation, greater than 50% of which was counseling/coordinating care for multiple sclerosis.

## 2018-10-08 ENCOUNTER — Non-Acute Institutional Stay (HOSPITAL_COMMUNITY)
Admission: AD | Admit: 2018-10-08 | Discharge: 2018-10-08 | Disposition: A | Discharge status: Home / Self Care | Payer: 59 | Source: Ambulatory Visit | Attending: Neurology | Admitting: Neurology

## 2018-10-08 ENCOUNTER — Ambulatory Visit: Payer: 59 | Admitting: Neurology

## 2018-10-08 DIAGNOSIS — G35 Multiple sclerosis: Secondary | ICD-10-CM | POA: Insufficient documentation

## 2018-10-08 DIAGNOSIS — H469 Unspecified optic neuritis: Secondary | ICD-10-CM | POA: Insufficient documentation

## 2018-10-08 LAB — CBC
HEMATOCRIT: 45.3 % (ref 36.0–46.0)
Hemoglobin: 14.3 g/dL (ref 12.0–15.0)
MCH: 28.3 pg (ref 26.0–34.0)
MCHC: 31.6 g/dL (ref 30.0–36.0)
MCV: 89.5 fL (ref 80.0–100.0)
NRBC: 0 % (ref 0.0–0.2)
Platelets: 169 10*3/uL (ref 150–400)
RBC: 5.06 MIL/uL (ref 3.87–5.11)
RDW: 14.6 % (ref 11.5–15.5)
WBC: 6.7 10*3/uL (ref 4.0–10.5)

## 2018-10-08 LAB — COMPREHENSIVE METABOLIC PANEL
ALBUMIN: 4.2 g/dL (ref 3.5–5.0)
ALT: 13 U/L (ref 0–44)
ANION GAP: 12 (ref 5–15)
AST: 16 U/L (ref 15–41)
Alkaline Phosphatase: 44 U/L (ref 38–126)
BILIRUBIN TOTAL: 0.6 mg/dL (ref 0.3–1.2)
BUN: 11 mg/dL (ref 6–20)
CHLORIDE: 106 mmol/L (ref 98–111)
CO2: 23 mmol/L (ref 22–32)
Calcium: 9.1 mg/dL (ref 8.9–10.3)
Creatinine, Ser: 0.77 mg/dL (ref 0.44–1.00)
GFR calc Af Amer: >60 mL/min (ref >60)
GFR calc non Af Amer: >60 mL/min (ref >60)
GLUCOSE: 103 mg/dL — AB (ref 70–99)
POTASSIUM: 3.5 mmol/L (ref 3.5–5.1)
SODIUM: 141 mmol/L (ref 135–145)
TOTAL PROTEIN: 6.4 g/dL — AB (ref 6.5–8.1)

## 2018-10-08 LAB — POCT I-STAT, CHEM 8
BUN: 9 mg/dL (ref 6–20)
CALCIUM ION: 0.63 mmol/L — AB (ref 1.15–1.40)
Chloride: 118 mmol/L — ABNORMAL HIGH (ref 98–111)
Creatinine, Ser: 0.4 mg/dL — ABNORMAL LOW (ref 0.44–1.00)
Glucose, Bld: 67 mg/dL — ABNORMAL LOW (ref 70–99)
HEMATOCRIT: 33 % — AB (ref 36.0–46.0)
Hemoglobin: 11.2 g/dL — ABNORMAL LOW (ref 12.0–15.0)
Potassium: 3 mmol/L — ABNORMAL LOW (ref 3.5–5.1)
SODIUM: 149 mmol/L — AB (ref 135–145)
TCO2: 17 mmol/L — ABNORMAL LOW (ref 22–32)

## 2018-10-08 MED ORDER — CALCIUM CARBONATE ANTACID 500 MG PO CHEW
2.0000 | CHEWABLE_TABLET | ORAL | Status: AC
  Administered 2018-10-08 (×2): 400 mg via ORAL

## 2018-10-08 MED ORDER — ACD FORMULA A 0.73-2.45-2.2 GM/100ML VI SOLN
Status: AC
  Filled 2018-10-08: qty 500

## 2018-10-08 MED ORDER — ACD FORMULA A 0.73-2.45-2.2 GM/100ML VI SOLN
500.0000 mL | Status: DC
  Administered 2018-10-08: 500 mL via INTRAVENOUS

## 2018-10-08 MED ORDER — ACETAMINOPHEN 325 MG PO TABS: 650.0000 mg | ORAL_TABLET | ORAL | Status: DC | PRN

## 2018-10-08 MED ORDER — ACD FORMULA A 0.73-2.45-2.2 GM/100ML VI SOLN
Status: AC
  Administered 2018-10-08: 08:00:00
  Filled 2018-10-08: qty 500

## 2018-10-08 MED ORDER — DIPHENHYDRAMINE HCL 25 MG PO CAPS: 25.0000 mg | ORAL_CAPSULE | Freq: Four times a day (QID) | ORAL | Status: DC | PRN

## 2018-10-08 MED ORDER — CALCIUM CARBONATE ANTACID 500 MG PO CHEW
CHEWABLE_TABLET | ORAL | Status: AC
  Filled 2018-10-08: qty 4

## 2018-10-08 MED ORDER — HEPARIN SODIUM (PORCINE) 1000 UNIT/ML IJ SOLN
1000.0000 [IU] | Freq: Once | INTRAMUSCULAR | Status: AC
  Administered 2018-10-08: 3.2 mL

## 2018-10-08 MED ORDER — SODIUM CHLORIDE 0.9 % IV SOLN
INTRAVENOUS | Status: AC
  Administered 2018-10-08 (×3): via INTRAVENOUS_CENTRAL
  Filled 2018-10-08 (×3): qty 200

## 2018-10-08 MED ORDER — SODIUM CHLORIDE 0.9 % IV SOLN
2.0000 g | Freq: Once | INTRAVENOUS | Status: AC
  Administered 2018-10-08: 2 g via INTRAVENOUS
  Filled 2018-10-08: qty 20

## 2018-10-08 MED ORDER — HEPARIN SODIUM (PORCINE) 1000 UNIT/ML IJ SOLN
INTRAMUSCULAR | Status: AC
  Filled 2018-10-08: qty 4

## 2018-10-09 MED ORDER — ONDANSETRON HCL 4 MG PO TABS: 4.0000 mg | ORAL_TABLET | Freq: Three times a day (TID) | ORAL | 0 refills | Status: DC | PRN

## 2018-10-09 NOTE — Addendum Note (Signed)
Addended by: France Ravens I on: 10/09/2018 03:51 PM   Modules accepted: Orders

## 2018-10-09 NOTE — Telephone Encounter (Signed)
Per RAS, ok for Zofran 4mg  #20 one po tid prn. Rx[facility]

## 2018-10-09 NOTE — Telephone Encounter (Signed)
Spoke with [facility]. She had catheter for plasmapharesis placed 3 days ago, sts. catheter is sore, she feels more sore than to be expected, but no fever, redness, drainage.  Sts. Dr. Barbie Banner with [facility] placed catheter for her.  I have advised she call his office to discuss catheter placement and possible need for eval./fim

## 2018-10-09 NOTE — Telephone Encounter (Signed)
Pt is asking if re: her 3 remaining  plasmapharesis can she have something called in for Anti nausea . If so please call into  [id number] - [place], [place] - [address] [phone] (Phone) [phone] (Fax)

## 2018-10-09 NOTE — Telephone Encounter (Signed)
Pt called stating that since the appt for the catheter she has been in pain and discomfort. Stating she was unsure who to call to discuss this with. Please advise

## 2018-10-10 ENCOUNTER — Other Ambulatory Visit: Payer: Self-pay | Admitting: Physician Assistant

## 2018-10-10 ENCOUNTER — Non-Acute Institutional Stay (HOSPITAL_COMMUNITY)
Admission: AD | Admit: 2018-10-10 | Discharge: 2018-10-10 | Disposition: A | Discharge status: Home / Self Care | Payer: 59 | Source: Ambulatory Visit | Attending: Neurology | Admitting: Neurology

## 2018-10-10 DIAGNOSIS — G35 Multiple sclerosis: Secondary | ICD-10-CM | POA: Diagnosis present

## 2018-10-10 DIAGNOSIS — G4700 Insomnia, unspecified: Secondary | ICD-10-CM

## 2018-10-10 DIAGNOSIS — M5431 Sciatica, right side: Secondary | ICD-10-CM

## 2018-10-10 LAB — POCT I-STAT, CHEM 8
BUN: 17 mg/dL (ref 6–20)
CALCIUM ION: 1.21 mmol/L (ref 1.15–1.40)
CHLORIDE: 106 mmol/L (ref 98–111)
Creatinine, Ser: 0.7 mg/dL (ref 0.44–1.00)
GLUCOSE: 106 mg/dL — AB (ref 70–99)
HCT: 41 % (ref 36.0–46.0)
Hemoglobin: 13.9 g/dL (ref 12.0–15.0)
Potassium: 3.7 mmol/L (ref 3.5–5.1)
SODIUM: 143 mmol/L (ref 135–145)
TCO2: 25 mmol/L (ref 22–32)

## 2018-10-10 LAB — CBC
HEMATOCRIT: 43.8 % (ref 36.0–46.0)
HEMOGLOBIN: 13.6 g/dL (ref 12.0–15.0)
MCH: 28.3 pg (ref 26.0–34.0)
MCHC: 31.1 g/dL (ref 30.0–36.0)
MCV: 91.3 fL (ref 80.0–100.0)
NRBC: 0 % (ref 0.0–0.2)
Platelets: 150 10*3/uL (ref 150–400)
RBC: 4.8 MIL/uL (ref 3.87–5.11)
RDW: 14.5 % (ref 11.5–15.5)
WBC: 5.4 10*3/uL (ref 4.0–10.5)

## 2018-10-10 LAB — COMPREHENSIVE METABOLIC PANEL
ALBUMIN: 4.2 g/dL (ref 3.5–5.0)
ALK PHOS: 36 U/L — AB (ref 38–126)
ALT: 16 U/L (ref 0–44)
AST: 16 U/L (ref 15–41)
Anion gap: 9 (ref 5–15)
BILIRUBIN TOTAL: 0.7 mg/dL (ref 0.3–1.2)
BUN: 14 mg/dL (ref 6–20)
CO2: 23 mmol/L (ref 22–32)
CREATININE: 0.77 mg/dL (ref 0.44–1.00)
Calcium: 8.9 mg/dL (ref 8.9–10.3)
Chloride: 110 mmol/L (ref 98–111)
GFR calc Af Amer: >60 mL/min (ref >60)
GLUCOSE: 109 mg/dL — AB (ref 70–99)
Potassium: 3.7 mmol/L (ref 3.5–5.1)
Sodium: 142 mmol/L (ref 135–145)
TOTAL PROTEIN: 6 g/dL — AB (ref 6.5–8.1)

## 2018-10-10 MED ORDER — HEPARIN SODIUM (PORCINE) 1000 UNIT/ML IJ SOLN
INTRAMUSCULAR | Status: AC
  Filled 2018-10-10: qty 4

## 2018-10-10 MED ORDER — HEPARIN SODIUM (PORCINE) 1000 UNIT/ML IJ SOLN: 1000.0000 [IU] | Freq: Once | INTRAMUSCULAR | Status: DC

## 2018-10-10 MED ORDER — SODIUM CHLORIDE 0.9 % IV SOLN
INTRAVENOUS | Status: AC
  Administered 2018-10-10 (×3): via INTRAVENOUS_CENTRAL
  Filled 2018-10-10 (×3): qty 200

## 2018-10-10 MED ORDER — ACETAMINOPHEN 325 MG PO TABS: 650.0000 mg | ORAL_TABLET | ORAL | Status: DC | PRN

## 2018-10-10 MED ORDER — CALCIUM CARBONATE ANTACID 500 MG PO CHEW
2.0000 | CHEWABLE_TABLET | ORAL | Status: AC
  Administered 2018-10-10 (×2): 400 mg via ORAL

## 2018-10-10 MED ORDER — ACD FORMULA A 0.73-2.45-2.2 GM/100ML VI SOLN: 500.0000 mL | Status: DC

## 2018-10-10 MED ORDER — HEPARIN SODIUM (PORCINE) 1000 UNIT/ML IJ SOLN
3.2000 mL | Freq: Once | INTRAMUSCULAR | Status: AC
  Administered 2018-10-10: 3200 [IU] via INTRAVENOUS

## 2018-10-10 MED ORDER — CALCIUM CARBONATE ANTACID 500 MG PO CHEW
CHEWABLE_TABLET | ORAL | Status: AC
  Filled 2018-10-10: qty 2

## 2018-10-10 MED ORDER — CALCIUM CARBONATE ANTACID 500 MG PO CHEW
CHEWABLE_TABLET | ORAL | Status: AC
  Filled 2018-10-10: qty 4

## 2018-10-10 MED ORDER — SODIUM CHLORIDE 0.9 % IV SOLN
2.0000 g | Freq: Once | INTRAVENOUS | Status: AC
  Administered 2018-10-10: 2 g via INTRAVENOUS
  Filled 2018-10-10: qty 20

## 2018-10-10 MED ORDER — ACD FORMULA A 0.73-2.45-2.2 GM/100ML VI SOLN
Status: AC
  Administered 2018-10-10: 08:00:00
  Filled 2018-10-10: qty 500

## 2018-10-10 MED ORDER — DIPHENHYDRAMINE HCL 25 MG PO CAPS: 25.0000 mg | ORAL_CAPSULE | Freq: Four times a day (QID) | ORAL | Status: DC | PRN

## 2018-10-10 NOTE — Progress Notes (Signed)
Pt tolerated TPE tx with no adverse events noted. No complaints. VSS. Ambulated well.

## 2018-10-11 ENCOUNTER — Non-Acute Institutional Stay (HOSPITAL_COMMUNITY)
Admission: RE | Admit: 2018-10-11 | Discharge: 2018-10-11 | Disposition: A | Discharge status: Home / Self Care | Payer: 59 | Attending: Neurology | Admitting: Neurology

## 2018-10-11 ENCOUNTER — Ambulatory Visit (HOSPITAL_COMMUNITY): Admission: RE | Admit: 2018-10-11 | Payer: 59 | Source: Home / Self Care

## 2018-10-11 ENCOUNTER — Inpatient Hospital Stay (HOSPITAL_COMMUNITY): Admission: RE | Admit: 2018-10-11 | Payer: Self-pay | Source: Home / Self Care

## 2018-10-11 DIAGNOSIS — G35 Multiple sclerosis: Secondary | ICD-10-CM | POA: Diagnosis not present

## 2018-10-11 DIAGNOSIS — Z992 Dependence on renal dialysis: Secondary | ICD-10-CM | POA: Diagnosis not present

## 2018-10-11 DIAGNOSIS — H469 Unspecified optic neuritis: Secondary | ICD-10-CM | POA: Insufficient documentation

## 2018-10-11 LAB — COMPREHENSIVE METABOLIC PANEL
ALK PHOS: 29 U/L — AB (ref 38–126)
ALT: 10 U/L (ref 0–44)
AST: 15 U/L (ref 15–41)
Albumin: 4.3 g/dL (ref 3.5–5.0)
Anion gap: 7 (ref 5–15)
BUN: 9 mg/dL (ref 6–20)
CALCIUM: 8.8 mg/dL — AB (ref 8.9–10.3)
CO2: 23 mmol/L (ref 22–32)
Chloride: 111 mmol/L (ref 98–111)
Creatinine, Ser: 0.76 mg/dL (ref 0.44–1.00)
GLUCOSE: 101 mg/dL — AB (ref 70–99)
POTASSIUM: 3.7 mmol/L (ref 3.5–5.1)
Sodium: 141 mmol/L (ref 135–145)
TOTAL PROTEIN: 5.6 g/dL — AB (ref 6.5–8.1)
Total Bilirubin: 0.7 mg/dL (ref 0.3–1.2)

## 2018-10-11 LAB — POCT I-STAT, CHEM 8
BUN: 10 mg/dL (ref 6–20)
Calcium, Ion: 1.19 mmol/L (ref 1.15–1.40)
Chloride: 107 mmol/L (ref 98–111)
Creatinine, Ser: 0.7 mg/dL (ref 0.44–1.00)
GLUCOSE: 99 mg/dL (ref 70–99)
HEMATOCRIT: 41 % (ref 36.0–46.0)
HEMOGLOBIN: 13.9 g/dL (ref 12.0–15.0)
Potassium: 3.6 mmol/L (ref 3.5–5.1)
SODIUM: 143 mmol/L (ref 135–145)
TCO2: 22 mmol/L (ref 22–32)

## 2018-10-11 LAB — CBC
HCT: 44.1 % (ref 36.0–46.0)
Hemoglobin: 13.7 g/dL (ref 12.0–15.0)
MCH: 28.4 pg (ref 26.0–34.0)
MCHC: 31.1 g/dL (ref 30.0–36.0)
MCV: 91.5 fL (ref 80.0–100.0)
Platelets: 144 10*3/uL — ABNORMAL LOW (ref 150–400)
RBC: 4.82 MIL/uL (ref 3.87–5.11)
RDW: 14.6 % (ref 11.5–15.5)
WBC: 4.8 10*3/uL (ref 4.0–10.5)
nRBC: 0 % (ref 0.0–0.2)

## 2018-10-11 MED ORDER — CALCIUM CARBONATE ANTACID 500 MG PO CHEW
CHEWABLE_TABLET | ORAL | Status: AC
  Administered 2018-10-11: 400 mg via ORAL
  Filled 2018-10-11: qty 4

## 2018-10-11 MED ORDER — SODIUM CHLORIDE 0.9 % IV SOLN
INTRAVENOUS | Status: AC
  Administered 2018-10-11 (×3): via INTRAVENOUS_CENTRAL
  Filled 2018-10-11 (×3): qty 200

## 2018-10-11 MED ORDER — CALCIUM CARBONATE ANTACID 500 MG PO CHEW
2.0000 | CHEWABLE_TABLET | ORAL | Status: DC
  Administered 2018-10-11: 400 mg via ORAL

## 2018-10-11 MED ORDER — HEPARIN SODIUM (PORCINE) 1000 UNIT/ML IJ SOLN: 1000.0000 [IU] | Freq: Once | INTRAMUSCULAR | Status: DC

## 2018-10-11 MED ORDER — ACD FORMULA A 0.73-2.45-2.2 GM/100ML VI SOLN
Status: AC
  Administered 2018-10-11: 500 mL via INTRAVENOUS
  Filled 2018-10-11: qty 500

## 2018-10-11 MED ORDER — HEPARIN SODIUM (PORCINE) 1000 UNIT/ML IJ SOLN
INTRAMUSCULAR | Status: AC
  Filled 2018-10-11: qty 4

## 2018-10-11 MED ORDER — DIPHENHYDRAMINE HCL 25 MG PO CAPS: 25.0000 mg | ORAL_CAPSULE | Freq: Four times a day (QID) | ORAL | Status: DC | PRN

## 2018-10-11 MED ORDER — ACD FORMULA A 0.73-2.45-2.2 GM/100ML VI SOLN
Status: AC
  Filled 2018-10-11: qty 500

## 2018-10-11 MED ORDER — ACD FORMULA A 0.73-2.45-2.2 GM/100ML VI SOLN
500.0000 mL | Status: DC
  Administered 2018-10-11: 500 mL via INTRAVENOUS

## 2018-10-11 MED ORDER — HEPARIN SODIUM (PORCINE) 1000 UNIT/ML IJ SOLN: 4000.0000 [IU] | Freq: Once | INTRAMUSCULAR | Status: DC

## 2018-10-11 MED ORDER — ACETAMINOPHEN 325 MG PO TABS: 650.0000 mg | ORAL_TABLET | ORAL | Status: DC | PRN

## 2018-10-11 MED ORDER — SODIUM CHLORIDE 0.9 % IV SOLN
2.0000 g | Freq: Once | INTRAVENOUS | Status: AC
  Administered 2018-10-11: 2 g via INTRAVENOUS
  Filled 2018-10-11: qty 20

## 2018-10-11 NOTE — Progress Notes (Signed)
tx completed without complications, Vital signs stable; denies n/v, pain, paresthesia, weakness.

## 2018-10-13 ENCOUNTER — Non-Acute Institutional Stay (HOSPITAL_COMMUNITY)
Admission: AD | Admit: 2018-10-13 | Discharge: 2018-10-13 | Disposition: A | Discharge status: Home / Self Care | Payer: 59 | Source: Ambulatory Visit | Attending: Neurology | Admitting: Neurology

## 2018-10-13 MED ORDER — ACD FORMULA A 0.73-2.45-2.2 GM/100ML VI SOLN: 500.0000 mL | Status: DC

## 2018-10-13 MED ORDER — CALCIUM CARBONATE ANTACID 500 MG PO CHEW: 2.0000 | CHEWABLE_TABLET | ORAL | Status: DC

## 2018-10-13 MED ORDER — ACD FORMULA A 0.73-2.45-2.2 GM/100ML VI SOLN
Status: AC
  Filled 2018-10-13: qty 500

## 2018-10-13 MED ORDER — HEPARIN SODIUM (PORCINE) 1000 UNIT/ML IJ SOLN: 1000.0000 [IU] | Freq: Once | INTRAMUSCULAR | Status: DC

## 2018-10-13 MED ORDER — SODIUM CHLORIDE 0.9 % IV SOLN
INTRAVENOUS | Status: DC
  Filled 2018-10-13 (×3): qty 200

## 2018-10-13 MED ORDER — ACETAMINOPHEN 325 MG PO TABS: 650.0000 mg | ORAL_TABLET | ORAL | Status: DC | PRN

## 2018-10-13 MED ORDER — CALCIUM CARBONATE ANTACID 500 MG PO CHEW
CHEWABLE_TABLET | ORAL | Status: AC
  Filled 2018-10-13: qty 4

## 2018-10-13 MED ORDER — SODIUM CHLORIDE 0.9 % IV SOLN
2.0000 g | Freq: Once | INTRAVENOUS | Status: DC
  Filled 2018-10-13: qty 20

## 2018-10-13 MED ORDER — DIPHENHYDRAMINE HCL 25 MG PO CAPS: 25.0000 mg | ORAL_CAPSULE | Freq: Four times a day (QID) | ORAL | Status: DC | PRN

## 2018-10-13 NOTE — Telephone Encounter (Signed)
Spoke with RAS and advised of Daine's message and received v/o to r/s for tomorrow. Spoke with Daine and gave v/o to r/s pt's plasmapharesis from 10/13/18 to 10/14/18/fim

## 2018-10-13 NOTE — Telephone Encounter (Signed)
Daine@ [facility] has called to inform they are unable to perform pt's plasmapharesis  Due to the machine being down.  Also Daine said the machine will not be fixed until tomorrow, she is asking for a call back to know how Dr Felecia Shelling would like to proceed

## 2018-10-14 ENCOUNTER — Telehealth: Payer: Self-pay | Admitting: Neurology

## 2018-10-14 ENCOUNTER — Non-Acute Institutional Stay (HOSPITAL_COMMUNITY)
Admission: RE | Admit: 2018-10-14 | Discharge: 2018-10-14 | Disposition: A | Discharge status: Home / Self Care | Payer: 59 | Source: Ambulatory Visit | Attending: Neurology | Admitting: Neurology

## 2018-10-14 DIAGNOSIS — H469 Unspecified optic neuritis: Secondary | ICD-10-CM

## 2018-10-14 DIAGNOSIS — G35 Multiple sclerosis: Secondary | ICD-10-CM

## 2018-10-14 LAB — CBC WITH DIFFERENTIAL/PLATELET
Abs Immature Granulocytes: 0.02 10*3/uL (ref 0.00–0.07)
BASOS ABS: 0.1 10*3/uL (ref 0.0–0.1)
Basophils Relative: 1 %
EOS ABS: 0.1 10*3/uL (ref 0.0–0.5)
EOS PCT: 2 %
HEMATOCRIT: 39.7 % (ref 36.0–46.0)
HEMOGLOBIN: 12.7 g/dL (ref 12.0–15.0)
Immature Granulocytes: 1 %
LYMPHS ABS: 1.1 10*3/uL (ref 0.7–4.0)
Lymphocytes Relative: 26 %
MCH: 29.3 pg (ref 26.0–34.0)
MCHC: 32 g/dL (ref 30.0–36.0)
MCV: 91.7 fL (ref 80.0–100.0)
MONO ABS: 0.3 10*3/uL (ref 0.1–1.0)
MONOS PCT: 6 %
NRBC: 0 % (ref 0.0–0.2)
Neutro Abs: 2.7 10*3/uL (ref 1.7–7.7)
Neutrophils Relative %: 64 %
Platelets: 149 10*3/uL — ABNORMAL LOW (ref 150–400)
RBC: 4.33 MIL/uL (ref 3.87–5.11)
RDW: 15 % (ref 11.5–15.5)
WBC: 4.2 10*3/uL (ref 4.0–10.5)

## 2018-10-14 MED ORDER — ACD FORMULA A 0.73-2.45-2.2 GM/100ML VI SOLN
500.0000 mL | Status: DC
  Administered 2018-10-14: 500 mL via INTRAVENOUS

## 2018-10-14 MED ORDER — SODIUM CHLORIDE 0.9 % IV SOLN
INTRAVENOUS | Status: AC
  Administered 2018-10-14 (×3): via INTRAVENOUS_CENTRAL
  Filled 2018-10-14 (×3): qty 200

## 2018-10-14 MED ORDER — ACD FORMULA A 0.73-2.45-2.2 GM/100ML VI SOLN
Status: AC
  Filled 2018-10-14: qty 500

## 2018-10-14 MED ORDER — ACETAMINOPHEN 325 MG PO TABS: 650.0000 mg | ORAL_TABLET | ORAL | Status: DC | PRN

## 2018-10-14 MED ORDER — HEPARIN SODIUM (PORCINE) 1000 UNIT/ML IJ SOLN
INTRAMUSCULAR | Status: AC
  Filled 2018-10-14: qty 4

## 2018-10-14 MED ORDER — CALCIUM CARBONATE ANTACID 500 MG PO CHEW
2.0000 | CHEWABLE_TABLET | ORAL | Status: DC
  Administered 2018-10-14: 400 mg via ORAL

## 2018-10-14 MED ORDER — HEPARIN SODIUM (PORCINE) 1000 UNIT/ML IJ SOLN: 1000.0000 [IU] | Freq: Once | INTRAMUSCULAR | Status: DC

## 2018-10-14 MED ORDER — SODIUM CHLORIDE 0.9 % IV SOLN
3.0000 g | Freq: Once | INTRAVENOUS | Status: AC
  Administered 2018-10-14: 3 g via INTRAVENOUS
  Filled 2018-10-14: qty 30

## 2018-10-14 MED ORDER — CALCIUM CARBONATE ANTACID 500 MG PO CHEW
CHEWABLE_TABLET | ORAL | Status: AC
  Administered 2018-10-14: 400 mg
  Filled 2018-10-14: qty 2

## 2018-10-14 MED ORDER — DIPHENHYDRAMINE HCL 25 MG PO CAPS: 25.0000 mg | ORAL_CAPSULE | Freq: Four times a day (QID) | ORAL | Status: DC | PRN

## 2018-10-14 NOTE — Telephone Encounter (Signed)
Spoke with Melissa and advised pt's catheter can be removed. Order for referral to IR for tunneled central venous catheter removal placed in Epic/fim

## 2018-10-14 NOTE — Telephone Encounter (Signed)
Melissa called stating the pt will be heading to IR in just a few moments and they haven not received the order to take out the catheter. Requesting the order faxed to [phone]

## 2018-10-14 NOTE — Addendum Note (Signed)
Addended by: France Ravens I on: 10/14/2018 02:45 PM   Modules accepted: Orders

## 2018-10-14 NOTE — Telephone Encounter (Signed)
Melissa at [facility] states patient is there for her final treatment of plasmapheresis. Is catheter to be removed today? If so order will need to be placed.  Please call and discuss.

## 2018-10-14 NOTE — Telephone Encounter (Signed)
Spoke with Ulice Dash at the hospital and asked that he let Melissa know order was put in Epic for referral to IR for catheter removal/fim

## 2018-10-15 ENCOUNTER — Encounter (HOSPITAL_COMMUNITY): Payer: Self-pay | Admitting: Physician Assistant

## 2018-10-15 ENCOUNTER — Other Ambulatory Visit: Payer: Self-pay | Admitting: Neurology

## 2018-10-15 ENCOUNTER — Ambulatory Visit (HOSPITAL_COMMUNITY)
Admission: RE | Admit: 2018-10-15 | Discharge: 2018-10-15 | Disposition: A | Discharge status: Home / Self Care | Payer: 59 | Source: Ambulatory Visit | Attending: Neurology | Admitting: Neurology

## 2018-10-15 DIAGNOSIS — Z452 Encounter for adjustment and management of vascular access device: Secondary | ICD-10-CM | POA: Diagnosis present

## 2018-10-15 DIAGNOSIS — G35 Multiple sclerosis: Secondary | ICD-10-CM

## 2018-10-15 DIAGNOSIS — H469 Unspecified optic neuritis: Secondary | ICD-10-CM | POA: Insufficient documentation

## 2018-10-15 HISTORY — PX: IR REMOVAL TUN CV CATH W/O FL: IMG2289

## 2018-10-15 MED ORDER — LIDOCAINE HCL 1 % IJ SOLN
INTRAMUSCULAR | Status: AC
  Filled 2018-10-15: qty 20

## 2018-10-15 MED ORDER — CHLORHEXIDINE GLUCONATE 4 % EX LIQD
CUTANEOUS | Status: AC
  Filled 2018-10-15: qty 15

## 2018-10-15 NOTE — Procedures (Signed)
Successful removal of tunneled central venous catheter   EBL: None No immediate complications.  Please see full dictation in imaging section of Epic.  Candiss Norse, PA-C

## 2018-11-04 ENCOUNTER — Ambulatory Visit: Payer: 59 | Admitting: Neurology

## 2018-11-04 ENCOUNTER — Other Ambulatory Visit: Payer: Self-pay | Admitting: Neurology

## 2018-11-04 ENCOUNTER — Encounter: Payer: Self-pay | Admitting: Neurology

## 2018-11-04 ENCOUNTER — Telehealth: Payer: Self-pay | Admitting: Neurology

## 2018-11-04 VITALS — BP 119/81 | HR 83 | Ht 66.0 in

## 2018-11-04 DIAGNOSIS — H469 Unspecified optic neuritis: Secondary | ICD-10-CM

## 2018-11-04 DIAGNOSIS — G35 Multiple sclerosis: Secondary | ICD-10-CM

## 2018-11-04 DIAGNOSIS — H539 Unspecified visual disturbance: Secondary | ICD-10-CM

## 2018-11-04 DIAGNOSIS — G4733 Obstructive sleep apnea (adult) (pediatric): Secondary | ICD-10-CM

## 2018-11-04 NOTE — Telephone Encounter (Signed)
Gave signed order to [facility].

## 2018-11-04 NOTE — Progress Notes (Signed)
[facility]  PATIENT: Patricia Deleon DOB: 27-Sep-1961  REFERRING DOCTOR OR PCP:  Fenton Malling, PA-C SOURCE: patient, ED notes, laboratory and imaging reports, MRI images on PACS.  _________________________________   HISTORICAL  CHIEF COMPLAINT:  Chief Complaint  Patient presents with  . Follow-up    Here alone. Received IV steroids 1 gram today prior to being seen. C/o vision changes in left eye now,     HISTORY OF PRESENT ILLNESS:  Patricia Deleon is a 57 y.o. woman with with multiple sclerosis and history of severe right optic neuritis now with transient left visual disturbance  Update 11/04/2018: Yesterday, she had an episode of left visual blurring lasting 5 to 10 minutes.  She felt she improved back to baseline afterwards but today had an additional episode.  At this time, she feels she is probably back to her baseline though improvement was slower than yesterday.  She had a severe right optic neuritis about 8 weeks ago treated initially with steroids and then with plasmapheresis.  She feels that she is starting to make some recovery in the right and was able to read large print letters today.  She had no light perception initially.  She also felt that the plasmapheresis helped her fatigue.  We had a long conversation about her optic neuritis.  Typically with MS recovery is more complete though some people do have residual severe visual disturbance.  The anti-NMO antibody test was negative.  About 5% of people with Devic's disease could have an anti--Maag antibody and we will see if we can get that test performed.  Additionally, I will check an pan-ANCA.   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   She will be going back to see ophthalmology next week.  Update 08/25/2018: On Saturday, she had a swollen sensation in her right eye.   Yesterday (Sunday) the eye hurt  with movements.  She noted some mild right vision change.   This morning the VA is worse in the right eye.   The upper visual field seems to have a veil covering it.    Colors are desaturated.  She has been on Tecfidera for the past 5 to 6 weeks.  She is tolerating it well.  She denies any change in her gait, strength, sensation or bladder.  She has noted more fatigue.   She has OSA treated with CPAP x 17 years.    She is sleepy in the afternoons and evenings.  She usually sleeps well at night.  EPWORTH SLEEPINESS SCALE  On a scale of 0 - 3 what is the chance of dozing:  Sitting and Reading:   3 Watching TV:    3 Sitting inactive in a public place: 1 Passenger in car for one hour: 3 Lying down to rest in the afternoon: 3 Sitting and talking to someone: 0 Sitting quietly after lunch:  2 In a car, stopped in traffic:  0  Total (out of 24):   15/24 moderate ESS.   Update 07/08/2018: I personally reviewed the MRIs of the cervical and thoracic spine performed last week and the MRI of the brain from last month and showed the images to Patricia Deleon.   The MRI of the cervical spine shows a focus to the right at C4 and possibly another focus midline at C3. The MRI of the thoracic spine shows midline foci at T5-T6  and T6-T7 and midline to the right at T7-T8.  We discussed that the combination of the brain and spine MRIs help [place] to diagnose her with multiple sclerosis.  I feel that she should be on a disease modifying therapy.  We had a long discussion about disease modifying therapies for MS.  She is most interested in the oral agents at this time but would be interested in switching to more efficacious intravenous medication if she needs to in the future if there is any breakthrough.  She has continued to have some intermittent episodes of visual symptoms followed by headache.  Most of the visual symptoms occur toward the left but one occurred on the right recently.   Additionally, she has had  more constant neck pain and back of the head pain on the left over the past couple weeks.  She feels her bladder symptoms are doing about the same.  From 06/18/2018: About 20 years ago, she had an episode of left visual disturbance x 20 minutes like a disc enlarging in her vision.   This has happened a few other times lasting 5 minutes or so.   Then, she had complete loss of vision (bilateral) for 5-10 seconds and partial visual loss for 5-10 minutes.    Additionally, she was having pain in the left eye and left side of her head going to the neck.   This started 2 weeks ago but worsened 06/14/2018.   Additionally, she had a couple hours of left sided facial numbness.     She went to the emergency room 06/14/2018.     From the ED, the case was discussed with inpatient neurology and an MRI of the brain was obtained.  It was concerning for demyelination showing multiple T2/FLAIR hyperintense foci in the right inferior cerebellum, posterior pons and periventricular white matter.   She was brought back for sagittal FLAIR images and postcontrast images.  There was an additional juxtacortical lesion in the left frontal lobe.  None of the foci enhanced.  Earlier this year she had some blood work and the vitamin D was mildly low (25).  Hemoglobin A1c was borderline elevated.  CMP was normal.  TSH was normal.  CBC did not show anemia though the MCV was mildly reduced the RDW was mildly widened..  The LDL was mildly elevated (124) HIV and hepatitis C were negative.  She had numbness in her thighs around 2010.    She had a known lumbar disc herniation and was told it may have been due to that.   She had an EMG/NCV.      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   She has had urge incontinence that has been attributed to her weight.  She also has urinary hesitancy, especially at night.    She has OSA, HTN, elevated cholesterol, hypothyroidism.   She does not have DM (borderline  HgbA1c though) and she does not smoke.    She has cardiac palpitations and had some PVCs noted.      I personally reviewed the MRI of the brain without contrast and the MRI of the brain with contrast that also included the sagittal FLAIR images.  There are about a dozen T2/FLAIR hyperintense foci includng one in the right cerebellar hemisphere, one just right of midline in the pons, multiple periventricular white matter, some radially oriented to the ventricles, one juxtacortical white matter and a couple of deep white matter foci.  There was a normal enhancement pattern.  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  REVIEW OF SYSTEMS: Constitutional: No fevers, chills, sweats, or change in appetite Eyes: No visual changes, double vision, eye pain Ear, nose and throat: No hearing loss, ear pain, nasal congestion, sore throat Cardiovascular: No chest pain, palpitations Respiratory: No shortness of breath at rest or with exertion.   No wheezes GastrointestinaI: No nausea, vomiting, diarrhea, abdominal pain, fecal incontinence Genitourinary:As above Musculoskeletal: No neck pain, back pain Integumentary: No rash, pruritus, skin lesions Neurological: as above Psychiatric: No depression at this time.  No anxiety Endocrine: No palpitations, diaphoresis, change in appetite, change in weigh or increased thirst Hematologic/Lymphatic: No anemia, purpura, petechiae. Allergic/Immunologic: No itchy/runny eyes, nasal congestion, recent allergic reactions, rashes  ALLERGIES: Allergies  Allergen Reactions  . Shellfish Allergy Hives    HOME MEDICATIONS:  Current Outpatient Medications:  .  amLODipine (NORVASC) 5 MG tablet, Take 1 tablet (5 mg total) by mouth daily., Disp: 90 tablet, Rfl: 1 .  Armodafinil (NUVIGIL) 200 MG TABS, One po qAM (Patient taking differently: Take 200 mg by mouth daily as needed. One po qAM), Disp: 30 tablet, Rfl: 5 .   aspirin 81 MG chewable tablet, Chew 81 mg by mouth daily., Disp: , Rfl:  .  Cholecalciferol (VITAMIN D3) 2000 UNITS capsule, Take 2,000 Units by mouth daily. , Disp: , Rfl:  .  DEXILANT 60 MG capsule, TAKE ONE CAPSULE BY MOUTH ONCE DAILY, Disp: 30 capsule, Rfl: 11 .  Dimethyl Fumarate (TECFIDERA) 240 MG CPDR, Take 240 mg by mouth 2 (two) times daily., Disp: , Rfl:  .  DULoxetine (CYMBALTA) 30 MG capsule, TAKE 1 CAPSULE (30 MG TOTAL) BY MOUTH DAILY. TAKE WITH 60MG , Disp: 90 capsule, Rfl: 4 .  DULoxetine (CYMBALTA) 60 MG capsule, TAKE 1 CAPSULE (60 MG TOTAL) BY MOUTH DAILY. TAKE WITH 30MG , Disp: 90 capsule, Rfl: 2 .  ferrous sulfate 325 (65 FE) MG tablet, Take 325 mg by mouth every evening. , Disp: , Rfl:  .  gabapentin (NEURONTIN) 100 MG capsule, TAKE 1 CAPSULE BY MOUTH TWICE A DAY (Patient taking differently: Take 200 mg by mouth at bedtime. ), Disp: 180 capsule, Rfl: 1 .  Glucosamine Sulfate-MSM (MSM/GLUCOSAMINE) 250-250 MG CAPS, Take 1 tablet by mouth 2 (two) times daily. , Disp: , Rfl:  .  levothyroxine (SYNTHROID, LEVOTHROID) 150 MCG tablet, TAKE 1 TABLET BY MOUTH ONCE DAILY BEFORE BREAKFAST (Patient taking differently: Take 150 mcg by mouth daily before breakfast. ), Disp: 90 tablet, Rfl: 1 .  Omega-3 Fatty Acids (FISH OIL) 500 MG CAPS, Take by mouth., Disp: , Rfl:  .  polyethylene glycol powder (MIRALAX) powder, Take 17 g by mouth at bedtime. , Disp: , Rfl:  .  temazepam (RESTORIL) 30 MG capsule, TAKE ONE CAPSULE BY MOUTH AT BEDTIME AS NEEDED FOR SLEEP, Disp: 90 capsule, Rfl: 1 .  vitamin C (ASCORBIC ACID) 500 MG tablet, Take 500 mg by mouth daily., Disp: , Rfl:  .  ondansetron (ZOFRAN) 4 MG tablet, Take 1 tablet (4 mg total) by mouth every 8 (eight) hours as needed for nausea or vomiting. (Patient not taking: Reported on 11/04/2018), Disp: 20 tablet, Rfl: 0  PAST MEDICAL HISTORY: Past Medical History:  Diagnosis Date  . Agoraphobia with panic disorder   . Anemia   . Depression   . Erosive  gastritis   . Fundic gland polyps of stomach, benign   . Geographic  tongue   . GERD (gastroesophageal reflux disease)   . History of esophagogastroduodenoscopy (EGD)   . Hypercholesterolemia   . Hypertension   . Intervertebral disc disorder    thoracic,thoracolumber,lumbosacral  . Obesity   . Sleep apnea     PAST SURGICAL HISTORY: Past Surgical History:  Procedure Laterality Date  . COLONOSCOPY    . COLONOSCOPY WITH PROPOFOL N/A 07/07/2018   Procedure: COLONOSCOPY WITH PROPOFOL;  Surgeon: Lollie Sails, MD;  Location: [facility] ENDOSCOPY;  Service: Endoscopy;  Laterality: N/A;  . ESOPHAGOGASTRODUODENOSCOPY (EGD) WITH PROPOFOL N/A 01/31/2016   Procedure: ESOPHAGOGASTRODUODENOSCOPY (EGD) WITH PROPOFOL;  Surgeon: Lollie Sails, MD;  Location: [facility] ENDOSCOPY;  Service: Endoscopy;  Laterality: N/A;  . IR FLUORO GUIDE CV LINE RIGHT  10/06/2018  . IR REMOVAL TUN CV CATH W/O FL  10/15/2018  . IR US GUIDE VASC ACCESS RIGHT  10/06/2018  . NISSEN FUNDOPLICATION      FAMILY HISTORY: Family History  Problem Relation Age of Onset  . Lung cancer Mother   . Hypertension Mother   . Alcohol abuse Mother   . Anxiety disorder Mother   . Hypertension Father   . Arthritis Father   . Alcohol abuse Father   . Esophageal cancer Father   . Alcohol abuse Brother   . Hypertension Brother   . Autism Son   . Coronary artery disease Maternal Grandfather   . Heart attack Maternal Grandfather   . CVA Paternal Grandmother   . Skin cancer Paternal Grandmother   . Cancer Maternal Grandmother     SOCIAL HISTORY:  Social History   Socioeconomic History  . Marital status: Married    Spouse name: Not on file  . Number of children: Not on file  . Years of education: Not on file  . Highest education level: Not on file  Occupational History  . Not on file  Social Needs  . Financial resource strain: Not on file  . Food insecurity:    Worry: Not on file    Inability: Not on file  . Transportation  needs:    Medical: Not on file    Non-medical: Not on file  Tobacco Use  . Smoking status: Never Smoker  . Smokeless tobacco: Never Used  Substance and Sexual Activity  . Alcohol use: No    Alcohol/week: 0.0 standard drinks  . Drug use: No  . Sexual activity: Not on file    Comment: Married   Lifestyle  . Physical activity:    Days per week: Not on file    Minutes per session: Not on file  . Stress: Not on file  Relationships  . Social connections:    Talks on phone: Not on file    Gets together: Not on file    Attends religious service: Not on file    Active member of club or organization: Not on file    Attends meetings of clubs or organizations: Not on file    Relationship status: Not on file  . Intimate partner violence:    Fear of current or ex partner: Not on file    Emotionally abused: Not on file    Physically abused: Not on file    Forced sexual activity: Not on file  Other Topics Concern  . Not on file  Social History Narrative  . Not on file     PHYSICAL EXAM  Vitals:   11/04/18 1512  BP: 119/81  Pulse: 83  Height: 5\' 6"  (1.676 m)  Body mass index is 47.04 kg/m.   General: The patient is well-developed and well-nourished and in no acute distress.     Neurologic Exam  Mental status: The patient is alert and oriented x 3 at the time of the examination. The patient has apparent normal recent and remote memory, with an apparently normal attention span and concentration ability.   Speech is normal.  Cranial nerves: Extraocular movements are full.  There is a right APD.  She has no color vision out of the right eye but is able to read large print today.  Left visual acuity seemed at least 20/30.  Facial strength and sensation was normal.  The tongue is midline, and the patient has symmetric elevation of the soft palate. No obvious hearing deficits are noted.  Motor:  Muscle bulk is normal.   Tone is normal. Strength is  5 / 5 in all 4 extremities.    Sensory: Sensory testing is intact to pinprick, soft touch and vibration sensation in all 4 extremities.  Coordination: Good finger-nose-finger and heel-to-shin. Gait and station: Station is normal.   The gait and tandem gait are normal.  Romberg is negative.   Reflexes: Deep tendon reflexes are symmetric and normal bilaterally.        DIAGNOSTIC DATA (LABS, IMAGING, TESTING) - I reviewed patient records, labs, notes, testing and imaging myself where available.  Lab Results  Component Value Date   WBC 4.2 10/14/2018   HGB 12.7 10/14/2018   HCT 39.7 10/14/2018   MCV 91.7 10/14/2018   PLT 149 (L) 10/14/2018      Component Value Date/Time   NA 141 10/11/2018 0825   NA 139 02/03/2018 1513   K 3.7 10/11/2018 0825   CL 111 10/11/2018 0825   CO2 23 10/11/2018 0825   GLUCOSE 101 (H) 10/11/2018 0825   BUN 9 10/11/2018 0825   BUN 19 02/03/2018 1513   CREATININE 0.76 10/11/2018 0825   CALCIUM 8.8 (L) 10/11/2018 0825   PROT 5.6 (L) 10/11/2018 0825   PROT 7.6 02/03/2018 1513   ALBUMIN 4.3 10/11/2018 0825   ALBUMIN 4.5 02/03/2018 1513   AST 15 10/11/2018 0825   ALT 10 10/11/2018 0825   ALKPHOS 29 (L) 10/11/2018 0825   BILITOT 0.7 10/11/2018 0825   BILITOT <0.2 02/03/2018 1513   GFRNONAA >60 10/11/2018 0825   GFRAA >60 10/11/2018 0825   Lab Results  Component Value Date   CHOL 197 02/03/2018   HDL 41 02/03/2018   LDLCALC 124 (H) 02/03/2018   TRIG 161 (H) 02/03/2018   CHOLHDL 5.5 (H) 09/28/2015   Lab Results  Component Value Date   HGBA1C 5.7 (H) 02/03/2018   No results found for: VITAMINB12 Lab Results  Component Value Date   TSH 2.460 02/03/2018       ASSESSMENT AND PLAN  Right optic neuritis - Plan: Pan-ANCA, Other/Misc lab test  Multiple sclerosis (HCC)  Transient vision disturbance of left eye  Obstructive sleep apnea syndrome   1.   I gave her  1 dose of IV Solu-Medrol (one gram) and would consider additional doses if the visual disturbance  returns.  The time course yesterday would be more concerning for a TIA but carotid Dopplers and echocardiogram were performed and were normal a couple months ago making TIA less likely.   2.   For now we will continue Tecfidera but if she does have a more definite exacerbation I would recommend a switch to another medication such as Ocrevus or Tysabri.  Additionally, if the anti-Maag is positive I would consider ocrelizumab or rituxan. 3.    Return to see me in 4 months or sooner if there are new or worsening neurologic symptoms.  Nekia Maxham A. Felecia Shelling, MD, PhD, FAAN Certified in Neurology, Clinical Neurophysiology, Sleep Medicine, Pain Medicine and Neuroimaging Director, [place] at [facility] [address], [place], [place] [address] [phone]

## 2018-11-04 NOTE — Progress Notes (Signed)
Patient received IV solumedrol 1 gram in office today. Infusion completed 1620. IV catheter discontinued intact. Site without signs and symptoms of complications. Dressing and pressure applied. Pt tolerated infusion well, no SE.

## 2018-11-04 NOTE — Telephone Encounter (Signed)
Pt requesting a call stating her vision is not fully back in her right eye but is now starting to get blurry in her left eye as well. Requesting a call discuss if there is anything that would help please advise

## 2018-11-04 NOTE — Telephone Encounter (Signed)
I called patient back. Advised I spoke with Dr. Felecia Shelling who would like to see her in the office today for evaluation. Would also like to do IV steroids 1Gx3 days. He is going to discuss treatment options/possibly switching DMT from tecfidera to another medication. Placed pt on schedule. Pt will come to office not. She is at work in [place] currently.

## 2018-11-05 ENCOUNTER — Telehealth: Payer: Self-pay | Admitting: *Deleted

## 2018-11-05 NOTE — Telephone Encounter (Signed)
I took call from phone staff. Spoke with [place] at [employer].  She stated they needed permission to bill our office for lab test: MOGFS that Dr. Felecia Shelling ordered and wants sent to [facility] clinic. They have to bill this way. Cannot bill the patient. Advised I will have to check with our lab here and verify this information.   Her call back number: [phone], opt 1, ext: [phone]

## 2018-11-06 LAB — PAN-ANCA
Myeloperoxidase Ab: <9 U/mL (ref 0.0–9.0)
P-ANCA: <1:20 {titer}

## 2018-11-06 NOTE — Telephone Encounter (Addendum)
I called [facility]. They stated [facility] would bill office for sending specimen and then [facility] will bill pt for running test.    I called Trinna back and she verified this. She is going to contact her department that handles this to make sure that they are only going to bill our office to send specimen and to get an estimated cost. Then our office would bill the pt. She will call back with this information.

## 2018-11-06 NOTE — Telephone Encounter (Signed)
Patricia Deleon called back. Stating our office would be billed full amount for drawing and running test: 713.25. When I questioned why [facility] billing for running test when [facility] is running test, she was not sure. I asked to be transferred to department who handles this to clarify this information.  [facility] is drawing the specimen and sending to [facility] to run the test.   Patricia Deleon transferred me to supervisor with [facility] to discuss further

## 2018-11-06 NOTE — Telephone Encounter (Signed)
I spoke with Patricia Deleon. Phone call 61min. She requested [facility] account #: [phone]. Advised [facility] drew lab and they are sending directly to [facility]. She is going to do some research and call our office back.

## 2018-11-07 NOTE — Telephone Encounter (Signed)
I called and spoke with [place] since I did not get a call from [place] with an update. Marguerita Merles stated Moses Manners spoke with her supervisor. They changed the code to 12 as a pass through and sent to [facility]. They will bill the patient's insurance. Nothing further needed.

## 2018-11-10 ENCOUNTER — Ambulatory Visit: Payer: 59 | Admitting: Physician Assistant

## 2018-11-10 ENCOUNTER — Encounter: Payer: Self-pay | Admitting: Physician Assistant

## 2018-11-10 VITALS — BP 120/90 | HR 100 | Temp 98.4°F | Resp 16 | Wt 284.0 lb

## 2018-11-10 DIAGNOSIS — J069 Acute upper respiratory infection, unspecified: Secondary | ICD-10-CM

## 2018-11-10 DIAGNOSIS — T3695XA Adverse effect of unspecified systemic antibiotic, initial encounter: Secondary | ICD-10-CM

## 2018-11-10 DIAGNOSIS — R05 Cough: Secondary | ICD-10-CM

## 2018-11-10 DIAGNOSIS — H66002 Acute suppurative otitis media without spontaneous rupture of ear drum, left ear: Secondary | ICD-10-CM

## 2018-11-10 DIAGNOSIS — J040 Acute laryngitis: Secondary | ICD-10-CM

## 2018-11-10 DIAGNOSIS — R059 Cough, unspecified: Secondary | ICD-10-CM

## 2018-11-10 DIAGNOSIS — B379 Candidiasis, unspecified: Secondary | ICD-10-CM

## 2018-11-10 LAB — SPECIMEN STATUS REPORT

## 2018-11-10 MED ORDER — BENZONATATE 200 MG PO CAPS: 200.0000 mg | ORAL_CAPSULE | Freq: Two times a day (BID) | ORAL | 0 refills | Status: DC | PRN

## 2018-11-10 MED ORDER — FLUCONAZOLE 150 MG PO TABS: 150.0000 mg | ORAL_TABLET | Freq: Once | ORAL | 0 refills | Status: AC

## 2018-11-10 MED ORDER — AMOXICILLIN-POT CLAVULANATE 875-125 MG PO TABS: 1.0000 | ORAL_TABLET | Freq: Two times a day (BID) | ORAL | 0 refills | Status: DC

## 2018-11-10 NOTE — Patient Instructions (Signed)
Upper Respiratory Infection, Adult Most upper respiratory infections (URIs) are caused by a virus. A URI affects the nose, throat, and upper air passages. The most common type of URI is often called "the common cold." Follow these instructions at home:  Take medicines only as told by your doctor.  Gargle warm saltwater or take cough drops to comfort your throat as told by your doctor.  Use a warm mist humidifier or inhale steam from a shower to increase air moisture. This may make it easier to breathe.  Drink enough fluid to keep your pee (urine) clear or pale yellow.  Eat soups and other clear broths.  Have a healthy diet.  Rest as needed.  Go back to work when your fever is gone or your doctor says it is okay. ? You may need to stay home longer to avoid giving your URI to others. ? You can also wear a face mask and wash your hands often to prevent spread of the virus.  Use your inhaler more if you have asthma.  Do not use any tobacco products, including cigarettes, chewing tobacco, or electronic cigarettes. If you need help quitting, ask your doctor. Contact a doctor if:  You are getting worse, not better.  Your symptoms are not helped by medicine.  You have chills.  You are getting more short of breath.  You have brown or red mucus.  You have yellow or brown discharge from your nose.  You have pain in your face, especially when you bend forward.  You have a fever.  You have puffy (swollen) neck glands.  You have pain while swallowing.  You have white areas in the back of your throat. Get help right away if:  You have very bad or constant: ? Headache. ? Ear pain. ? Pain in your forehead, behind your eyes, and over your cheekbones (sinus pain). ? Chest pain.  You have long-lasting (chronic) lung disease and any of the following: ? Wheezing. ? Long-lasting cough. ? Coughing up blood. ? A change in your usual mucus.  You have a stiff neck.  You have  changes in your: ? Vision. ? Hearing. ? Thinking. ? Mood. This information is not intended to replace advice given to you by your health care provider. Make sure you discuss any questions you have with your health care provider. Document Released: 06/04/2008 Document Revised: 08/19/2016 Document Reviewed: 03/24/2014 Elsevier Interactive Patient Education  2018 [employer]  

## 2018-11-10 NOTE — Progress Notes (Signed)
Patient: Patricia Deleon Female    DOB: Aug 01, 1961   57 y.o.   MRN: [id number] Visit Date: 11/10/2018  Today's Provider: Mar Daring, PA-C   Chief Complaint  Patient presents with  . Sore Throat   Subjective:    Sore Throat   This is a new problem. The current episode started in the past 7 days (Started on Friday). The problem has been gradually worsening. Neither (More towards the right) side of throat is experiencing more pain than the other. Maximum temperature: 99.0 last night. The pain is moderate. Associated symptoms include congestion, coughing, ear pain, a hoarse voice, a plugged ear sensation and swollen glands. Pertinent negatives include no headaches, shortness of breath or trouble swallowing. She has had no exposure to strep or mono. Treatments tried: aleve and cough medicines. The treatment provided no relief.      Allergies  Allergen Reactions  . Shellfish Allergy Hives     Current Outpatient Medications:  .  amLODipine (NORVASC) 5 MG tablet, Take 1 tablet (5 mg total) by mouth daily., Disp: 90 tablet, Rfl: 1 .  Armodafinil (NUVIGIL) 200 MG TABS, One po qAM (Patient taking differently: Take 200 mg by mouth daily as needed. One po qAM), Disp: 30 tablet, Rfl: 5 .  aspirin 81 MG chewable tablet, Chew 81 mg by mouth daily., Disp: , Rfl:  .  Cholecalciferol (VITAMIN D3) 2000 UNITS capsule, Take 2,000 Units by mouth daily. , Disp: , Rfl:  .  DEXILANT 60 MG capsule, TAKE ONE CAPSULE BY MOUTH ONCE DAILY, Disp: 30 capsule, Rfl: 11 .  Dimethyl Fumarate (TECFIDERA) 240 MG CPDR, Take 240 mg by mouth 2 (two) times daily., Disp: , Rfl:  .  DULoxetine (CYMBALTA) 30 MG capsule, TAKE 1 CAPSULE (30 MG TOTAL) BY MOUTH DAILY. TAKE WITH 60MG , Disp: 90 capsule, Rfl: 4 .  DULoxetine (CYMBALTA) 60 MG capsule, TAKE 1 CAPSULE (60 MG TOTAL) BY MOUTH DAILY. TAKE WITH 30MG , Disp: 90 capsule, Rfl: 2 .  ferrous sulfate 325 (65 FE) MG tablet, Take 325 mg by mouth every evening. ,  Disp: , Rfl:  .  gabapentin (NEURONTIN) 100 MG capsule, TAKE 1 CAPSULE BY MOUTH TWICE A DAY (Patient taking differently: Take 200 mg by mouth at bedtime. ), Disp: 180 capsule, Rfl: 1 .  Glucosamine Sulfate-MSM (MSM/GLUCOSAMINE) 250-250 MG CAPS, Take 1 tablet by mouth 2 (two) times daily. , Disp: , Rfl:  .  levothyroxine (SYNTHROID, LEVOTHROID) 150 MCG tablet, TAKE 1 TABLET BY MOUTH ONCE DAILY BEFORE BREAKFAST (Patient taking differently: Take 150 mcg by mouth daily before breakfast. ), Disp: 90 tablet, Rfl: 1 .  Omega-3 Fatty Acids (FISH OIL) 500 MG CAPS, Take by mouth., Disp: , Rfl:  .  ondansetron (ZOFRAN) 4 MG tablet, Take 1 tablet (4 mg total) by mouth every 8 (eight) hours as needed for nausea or vomiting., Disp: 20 tablet, Rfl: 0 .  polyethylene glycol powder (MIRALAX) powder, Take 17 g by mouth at bedtime. , Disp: , Rfl:  .  temazepam (RESTORIL) 30 MG capsule, TAKE ONE CAPSULE BY MOUTH AT BEDTIME AS NEEDED FOR SLEEP, Disp: 90 capsule, Rfl: 1 .  vitamin C (ASCORBIC ACID) 500 MG tablet, Take 500 mg by mouth daily., Disp: , Rfl:   Review of Systems  Constitutional: Positive for chills. Negative for fever.  HENT: Positive for congestion, ear pain, hoarse voice, rhinorrhea, sinus pain and sore throat. Negative for postnasal drip, sneezing and trouble swallowing.   Respiratory:  Positive for cough. Negative for chest tightness, shortness of breath and wheezing.   Cardiovascular: Negative for chest pain, palpitations and leg swelling.  Neurological: Negative for headaches.    Social History   Tobacco Use  . Smoking status: Never Smoker  . Smokeless tobacco: Never Used  Substance Use Topics  . Alcohol use: No    Alcohol/week: 0.0 standard drinks   Objective:   BP 120/90 (BP Location: Left Arm, Patient Position: Sitting, Cuff Size: Normal)   Pulse 100   Temp 98.4 F (36.9 C) (Oral)   Resp 16   Wt 284 lb (128.8 kg)   SpO2 97%   BMI 45.84 kg/m  Vitals:   11/10/18 1725  BP: 120/90    Pulse: 100  Resp: 16  Temp: 98.4 F (36.9 C)  TempSrc: Oral  SpO2: 97%  Weight: 284 lb (128.8 kg)     Physical Exam  Constitutional: She appears well-developed and well-nourished. No distress.  HENT:  Head: Normocephalic and atraumatic.  Right Ear: Hearing, tympanic membrane, external ear and ear canal normal.  Left Ear: Hearing, external ear and ear canal normal. There is tenderness. Tympanic membrane is erythematous and bulging. A middle ear effusion is present.  Nose: Nose normal.  Mouth/Throat: Uvula is midline and mucous membranes are normal. Posterior oropharyngeal erythema present. No oropharyngeal exudate or posterior oropharyngeal edema.  Eyes: Pupils are equal, round, and reactive to light. Conjunctivae are normal. Right eye exhibits no discharge. Left eye exhibits no discharge. No scleral icterus.  Neck: Normal range of motion. Neck supple. No tracheal deviation present. No thyromegaly present.  Cardiovascular: Normal rate, regular rhythm and normal heart sounds. Exam reveals no gallop and no friction rub.  No murmur heard. Pulmonary/Chest: Effort normal and breath sounds normal. No stridor. No respiratory distress. She has no wheezes. She has no rales.  Lymphadenopathy:    She has no cervical adenopathy.  Skin: Skin is warm and dry. She is not diaphoretic.  Vitals reviewed.       Assessment & Plan:     1. Upper respiratory tract infection, unspecified type Worsening symptoms that have not responded to OTC medications. Will give augmentin as below. Continue allergy medications. Stay well hydrated and get plenty of rest. Call if no symptom improvement or if symptoms worsen. - amoxicillin-clavulanate (AUGMENTIN) 875-125 MG tablet; Take 1 tablet by mouth 2 (two) times daily.  Dispense: 20 tablet; Refill: 0  2. Non-recurrent acute suppurative otitis media of left ear without spontaneous rupture of tympanic membrane See above medical treatment plan. -  amoxicillin-clavulanate (AUGMENTIN) 875-125 MG tablet; Take 1 tablet by mouth 2 (two) times daily.  Dispense: 20 tablet; Refill: 0  3. Laryngitis Salt water gargles, voice rest.   4. Cough Tessalon perles for cough prn.  - benzonatate (TESSALON) 200 MG capsule; Take 1 capsule (200 mg total) by mouth 2 (two) times daily as needed for cough.  Dispense: 20 capsule; Refill: 0  5. Antibiotic-induced yeast infection Gets yeast infection with antibiotic use. Diflucan given as below for when needed.  - fluconazole (DIFLUCAN) 150 MG tablet; Take 1 tablet (150 mg total) by mouth once for 1 dose.  Dispense: 1 tablet; Refill: 0       Mar Daring, PA-C  [facility]

## 2018-11-12 NOTE — Telephone Encounter (Signed)
I called pt to let her know MOG test cx by [facility] d/t receiving specimen room temp. Also advised the cost of test. We are trying to figure out how to bill her insurance. Dr. Felecia Shelling back in office Friday. Will discuss with him and call her with update afterwards. She verbalized understanding and appreciation for call.  She is currently home sick with URI/ear infection. Being treated with antibiotics.

## 2018-11-13 ENCOUNTER — Telehealth: Payer: Self-pay | Admitting: Physician Assistant

## 2018-11-13 NOTE — Telephone Encounter (Signed)
I called and spoke with patient. Relayed I was able to set up account with [facility] and link our office to order test. Explained she will go to specific lab site to have drawn. They are first going to call her after doing a benefit investigation to go over cost. She will need to pick up order form from our office. Explained there is potential financial assistance. I will provide her a brochure when she picks up order form. She verbalized understanding.

## 2018-11-13 NOTE — Telephone Encounter (Signed)
Patient was advised and states that she going to ask her boss if she will need a doctors notes.

## 2018-11-13 NOTE — Telephone Encounter (Signed)
She has only been on treatment for 3 days. Sometimes it can take a full week to clear infections. If she has been unable to go to work I can give her a work note.

## 2018-11-13 NOTE — Telephone Encounter (Signed)
Pt is requesting call back to see if Tawanna Sat wants to see her again or if there is something else she can try to feel better. Pt stated that she was seen on 11/10/18 and she isn't feeling any better. Pt is also asking if maybe this is taking longer for her to feel better due to her having MS. Please advise. Thanks TNP

## 2018-11-24 ENCOUNTER — Telehealth: Payer: Self-pay | Admitting: Neurology

## 2018-11-24 NOTE — Telephone Encounter (Signed)
Patient is going for Lab today at [facility] she needs to make sure she has everything.  Patient want's to make sure everything is right so she want have to make another trip .  Patient needs's order . Patricia Deleon Please call her.

## 2018-11-24 NOTE — Telephone Encounter (Signed)
Called pt back. Reminded her that I have order form with financial information for her to pick up at our office. She verbalized understanding and will come by today to pick up.

## 2018-12-09 ENCOUNTER — Telehealth: Payer: Self-pay | Admitting: *Deleted

## 2018-12-09 NOTE — Telephone Encounter (Signed)
LMOM with below lab results.  She does not need to return this call unless she has questions/fim 

## 2018-12-09 NOTE — Telephone Encounter (Signed)
Received results from [facility]: MOG FACS, S negative. "No informative autoantibodies were detected in this evaluation. A negative result does not preclude a diagnosis of an inflammatory CNS". Gave to Dr. Felecia Shelling to review. Sent copy to be scanned in Epic as well.

## 2018-12-10 ENCOUNTER — Other Ambulatory Visit: Payer: Self-pay

## 2018-12-10 ENCOUNTER — Encounter: Payer: Self-pay | Admitting: Neurology

## 2018-12-10 ENCOUNTER — Ambulatory Visit: Payer: 59 | Admitting: Neurology

## 2018-12-10 VITALS — BP 138/86 | HR 79 | Resp 20 | Ht 66.0 in | Wt 291.0 lb

## 2018-12-10 DIAGNOSIS — H469 Unspecified optic neuritis: Secondary | ICD-10-CM | POA: Diagnosis not present

## 2018-12-10 DIAGNOSIS — G35 Multiple sclerosis: Secondary | ICD-10-CM | POA: Diagnosis not present

## 2018-12-10 DIAGNOSIS — G4733 Obstructive sleep apnea (adult) (pediatric): Secondary | ICD-10-CM

## 2018-12-10 MED ORDER — ALPRAZOLAM 1 MG PO TABS: ORAL_TABLET | ORAL | 0 refills | Status: DC

## 2018-12-10 NOTE — Progress Notes (Signed)
[facility]  PATIENT: Patricia Deleon DOB: 01/29/1961  REFERRING DOCTOR OR PCP:  Fenton Malling, PA-C SOURCE: patient, ED notes, laboratory and imaging reports, MRI images on PACS.  _________________________________   HISTORICAL  CHIEF COMPLAINT:  Chief Complaint  Patient presents with  . Multiple Sclerosis    Rm. 13.  Sts. she continues to [employer] well.  She completed plasmapheresis. Sts. she has had very little improvement in vision right eye. Sts. she still can't see color. Recent appt. with opthalmology showed 20/2100 vision right eye./fim    HISTORY OF PRESENT ILLNESS:  Patricia Deleon is a 57 y.o. woman with with multiple sclerosis and history of severe right optic neuritis.   Update 12/10/2018: She is on Tecfidera and tolerates it well.    She had a severe right optic neuritis (total loss of vision) treated with 2 courses of IV Solu-medrol followed by plasmapheresis a couple weeks after starting Tecfidera.    She has had some improvement but still can't read out of the right eye and has no color vision out of that eye.   She had eye re-evaluated and vision was 20/2100 OD.      Last month she had milder left visual changes that seemed to resolve over a day or so.   We gave one day of IV Solu-Medrol.   This episode resolved completely.   Her left vision is sometimes blurry at the end of the day as she is on a computer the whole day.     She has no numbness weakness.   She feels balance is mildly off when going down stairs.   Sometimes, she has trouble coming up with a word but no major cognitive issue.    Her fatigue is better on Armodafinil.   Mood is doing ok on current medications ([facility] at [facility] in [place]).    Anti-NMO and anti-MOG antibodies are negative.     Update November 22, 2018: Yesterday, she had an episode of left visual blurring lasting 5 to 10 minutes.  She felt she improved back to baseline afterwards  but today had an additional episode.  At this time, she feels she is probably back to her baseline though improvement was slower than yesterday.  She had a severe right optic neuritis about 8 weeks ago treated initially with steroids and then with plasmapheresis.  She feels that she is starting to make some recovery in the right and was able to read large print letters today.  She had no light perception initially.  She also felt that the plasmapheresis helped her fatigue.  We had a long conversation about her optic neuritis.  Typically with MS recovery is more complete though some people do have residual severe visual disturbance.  The anti-NMO antibody test was negative.  About 5% of people with Devic's disease could have an anti--Maag antibody and we will see if we can get that test performed.  Additionally, I will check an pan-ANCA.   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   She will be going back to see ophthalmology next week.  Update 08/25/2018: On Saturday, she had a swollen sensation in her right eye.   Yesterday (Sunday) the eye hurt with movements.  She noted some mild right vision change.   This morning the VA is worse in the right eye.   The upper visual field seems to  have a veil covering it.    Colors are desaturated.  She has been on Tecfidera for the past 5 to 6 weeks.  She is tolerating it well.  She denies any change in her gait, strength, sensation or bladder.  She has noted more fatigue.   She has OSA treated with CPAP x 17 years.    She is sleepy in the afternoons and evenings.  She usually sleeps well at night.  EPWORTH SLEEPINESS SCALE  On a scale of 0 - 3 what is the chance of dozing:  Sitting and Reading:   3 Watching TV:    3 Sitting inactive in a public place: 1 Passenger in car for one hour: 3 Lying down to rest in the afternoon: 3 Sitting and talking to someone: 0 Sitting  quietly after lunch:  2 In a car, stopped in traffic:  0  Total (out of 24):   15/24 moderate ESS.   Update 07/08/2018: I personally reviewed the MRIs of the cervical and thoracic spine performed last week and the MRI of the brain from last month and showed the images to Mrs. Deloach.   The MRI of the cervical spine shows a focus to the right at C4 and possibly another focus midline at C3. The MRI of the thoracic spine shows midline foci at T5-T6 and T6-T7 and midline to the right at T7-T8.  We discussed that the combination of the brain and spine MRIs help [place] to diagnose her with multiple sclerosis.  I feel that she should be on a disease modifying therapy.  We had a long discussion about disease modifying therapies for MS.  She is most interested in the oral agents at this time but would be interested in switching to more efficacious intravenous medication if she needs to in the future if there is any breakthrough.  She has continued to have some intermittent episodes of visual symptoms followed by headache.  Most of the visual symptoms occur toward the left but one occurred on the right recently.   Additionally, she has had more constant neck pain and back of the head pain on the left over the past couple weeks.  She feels her bladder symptoms are doing about the same.  From 06/18/2018: About 20 years ago, she had an episode of left visual disturbance x 20 minutes like a disc enlarging in her vision.   This has happened a few other times lasting 5 minutes or so.   Then, she had complete loss of vision (bilateral) for 5-10 seconds and partial visual loss for 5-10 minutes.    Additionally, she was having pain in the left eye and left side of her head going to the neck.   This started 2 weeks ago but worsened 06/14/2018.   Additionally, she had a couple hours of left sided facial numbness.     She went to the emergency room 06/14/2018.     From the ED, the case was discussed with inpatient  neurology and an MRI of the brain was obtained.  It was concerning for demyelination showing multiple T2/FLAIR hyperintense foci in the right inferior cerebellum, posterior pons and periventricular white matter.   She was brought back for sagittal FLAIR images and postcontrast images.  There was an additional juxtacortical lesion in the left frontal lobe.  None of the foci enhanced.  Earlier this year she had some blood work and the vitamin D was mildly low (25).  Hemoglobin A1c was borderline elevated.  CMP  was normal.  TSH was normal.  CBC did not show anemia though the MCV was mildly reduced the RDW was mildly widened..  The LDL was mildly elevated (124) HIV and hepatitis C were negative.  She had numbness in her thighs around 2010.    She had a known lumbar disc herniation and was told it may have been due to that.   She had an EMG/NCV.      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   She has had urge incontinence that has been attributed to her weight.  She also has urinary hesitancy, especially at night.    She has OSA, HTN, elevated cholesterol, hypothyroidism.   She does not have DM (borderline HgbA1c though) and she does not smoke.    She has cardiac palpitations and had some PVCs noted.      I personally reviewed the MRI of the brain without contrast and the MRI of the brain with contrast that also included the sagittal FLAIR images.  There are about a dozen T2/FLAIR hyperintense foci includng one in the right cerebellar hemisphere, one just right of midline in the pons, multiple periventricular white matter, some radially oriented to the ventricles, one juxtacortical white matter and a couple of deep white matter foci.  There was a normal enhancement pattern.  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  REVIEW OF SYSTEMS: Constitutional: No fevers, chills, sweats, or change in appetite Eyes: No visual  changes, double vision, eye pain Ear, nose and throat: No hearing loss, ear pain, nasal congestion, sore throat Cardiovascular: No chest pain, palpitations Respiratory: No shortness of breath at rest or with exertion.   No wheezes GastrointestinaI: No nausea, vomiting, diarrhea, abdominal pain, fecal incontinence Genitourinary:As above Musculoskeletal: No neck pain, back pain Integumentary: No rash, pruritus, skin lesions Neurological: as above Psychiatric: No depression at this time.  No anxiety Endocrine: No palpitations, diaphoresis, change in appetite, change in weigh or increased thirst Hematologic/Lymphatic: No anemia, purpura, petechiae. Allergic/Immunologic: No itchy/runny eyes, nasal congestion, recent allergic reactions, rashes  ALLERGIES: Allergies  Allergen Reactions  . Shellfish Allergy Hives    HOME MEDICATIONS:  Current Outpatient Medications:  .  amLODipine (NORVASC) 5 MG tablet, Take 1 tablet (5 mg total) by mouth daily., Disp: 90 tablet, Rfl: 1 .  amoxicillin-clavulanate (AUGMENTIN) 875-125 MG tablet, Take 1 tablet by mouth 2 (two) times daily., Disp: 20 tablet, Rfl: 0 .  Armodafinil (NUVIGIL) 200 MG TABS, One po qAM (Patient taking differently: Take 200 mg by mouth daily as needed. One po qAM), Disp: 30 tablet, Rfl: 5 .  aspirin 81 MG chewable tablet, Chew 81 mg by mouth daily., Disp: , Rfl:  .  benzonatate (TESSALON) 200 MG capsule, Take 1 capsule (200 mg total) by mouth 2 (two) times daily as needed for cough., Disp: 20 capsule, Rfl: 0 .  Cholecalciferol (VITAMIN D3) 2000 UNITS capsule, Take 2,000 Units by mouth daily. , Disp: , Rfl:  .  DEXILANT 60 MG capsule, TAKE ONE CAPSULE BY MOUTH ONCE DAILY, Disp: 30 capsule, Rfl: 11 .  Dimethyl Fumarate (TECFIDERA) 240 MG CPDR, Take 240 mg by mouth 2 (two) times daily., Disp: , Rfl:  .  DULoxetine (CYMBALTA) 30 MG capsule, TAKE 1 CAPSULE (30 MG TOTAL) BY MOUTH DAILY. TAKE WITH 60MG , Disp: 90 capsule, Rfl: 4 .   DULoxetine (CYMBALTA) 60 MG capsule, TAKE 1 CAPSULE (60  MG TOTAL) BY MOUTH DAILY. TAKE WITH 30MG , Disp: 90 capsule, Rfl: 2 .  ferrous sulfate 325 (65 FE) MG tablet, Take 325 mg by mouth every evening. , Disp: , Rfl:  .  gabapentin (NEURONTIN) 100 MG capsule, TAKE 1 CAPSULE BY MOUTH TWICE A DAY (Patient taking differently: Take 200 mg by mouth at bedtime. ), Disp: 180 capsule, Rfl: 1 .  Glucosamine Sulfate-MSM (MSM/GLUCOSAMINE) 250-250 MG CAPS, Take 1 tablet by mouth 2 (two) times daily. , Disp: , Rfl:  .  levothyroxine (SYNTHROID, LEVOTHROID) 150 MCG tablet, TAKE 1 TABLET BY MOUTH ONCE DAILY BEFORE BREAKFAST (Patient taking differently: Take 150 mcg by mouth daily before breakfast. ), Disp: 90 tablet, Rfl: 1 .  Omega-3 Fatty Acids (FISH OIL) 500 MG CAPS, Take by mouth., Disp: , Rfl:  .  polyethylene glycol powder (MIRALAX) powder, Take 17 g by mouth at bedtime. , Disp: , Rfl:  .  temazepam (RESTORIL) 30 MG capsule, TAKE ONE CAPSULE BY MOUTH AT BEDTIME AS NEEDED FOR SLEEP, Disp: 90 capsule, Rfl: 1 .  vitamin C (ASCORBIC ACID) 500 MG tablet, Take 500 mg by mouth daily., Disp: , Rfl:  .  ALPRAZolam (XANAX) 1 MG tablet, Take 2 to 3 pills po before the MRI, Disp: 3 tablet, Rfl: 0  PAST MEDICAL HISTORY: Past Medical History:  Diagnosis Date  . Agoraphobia with panic disorder   . Anemia   . Depression   . Erosive gastritis   . Fundic gland polyps of stomach, benign   . Geographic tongue   . GERD (gastroesophageal reflux disease)   . History of esophagogastroduodenoscopy (EGD)   . Hypercholesterolemia   . Hypertension   . Intervertebral disc disorder    thoracic,thoracolumber,lumbosacral  . Obesity   . Sleep apnea     PAST SURGICAL HISTORY: Past Surgical History:  Procedure Laterality Date  . COLONOSCOPY    . COLONOSCOPY WITH PROPOFOL N/A 07/07/2018   Procedure: COLONOSCOPY WITH PROPOFOL;  Surgeon: Lollie Sails, MD;  Location: [facility] ENDOSCOPY;  Service: Endoscopy;  Laterality: N/A;  .  ESOPHAGOGASTRODUODENOSCOPY (EGD) WITH PROPOFOL N/A 01/31/2016   Procedure: ESOPHAGOGASTRODUODENOSCOPY (EGD) WITH PROPOFOL;  Surgeon: Lollie Sails, MD;  Location: [facility] ENDOSCOPY;  Service: Endoscopy;  Laterality: N/A;  . IR FLUORO GUIDE CV LINE RIGHT  10/06/2018  . IR REMOVAL TUN CV CATH W/O FL  10/15/2018  . IR US GUIDE VASC ACCESS RIGHT  10/06/2018  . NISSEN FUNDOPLICATION      FAMILY HISTORY: Family History  Problem Relation Age of Onset  . Lung cancer Mother   . Hypertension Mother   . Alcohol abuse Mother   . Anxiety disorder Mother   . Hypertension Father   . Arthritis Father   . Alcohol abuse Father   . Esophageal cancer Father   . Alcohol abuse Brother   . Hypertension Brother   . Autism Son   . Coronary artery disease Maternal Grandfather   . Heart attack Maternal Grandfather   . CVA Paternal Grandmother   . Skin cancer Paternal Grandmother   . Cancer Maternal Grandmother     SOCIAL HISTORY:  Social History   Socioeconomic History  . Marital status: Married    Spouse name: Not on file  . Number of children: Not on file  . Years of education: Not on file  . Highest education level: Not on file  Occupational History  . Not on file  Social Needs  . Financial resource strain: Not on file  . Food insecurity:  Worry: Not on file    Inability: Not on file  . Transportation needs:    Medical: Not on file    Non-medical: Not on file  Tobacco Use  . Smoking status: Never Smoker  . Smokeless tobacco: Never Used  Substance and Sexual Activity  . Alcohol use: No    Alcohol/week: 0.0 standard drinks  . Drug use: No  . Sexual activity: Not on file    Comment: Married   Lifestyle  . Physical activity:    Days per week: Not on file    Minutes per session: Not on file  . Stress: Not on file  Relationships  . Social connections:    Talks on phone: Not on file    Gets together: Not on file    Attends religious service: Not on file    Active member of club  or organization: Not on file    Attends meetings of clubs or organizations: Not on file    Relationship status: Not on file  . Intimate partner violence:    Fear of current or ex partner: Not on file    Emotionally abused: Not on file    Physically abused: Not on file    Forced sexual activity: Not on file  Other Topics Concern  . Not on file  Social History Narrative  . Not on file     PHYSICAL EXAM  Vitals:   12/10/18 1551  BP: 138/86  Pulse: 79  Resp: 20  Weight: 291 lb (132 kg)  Height: 5\' 6"  (1.676 m)    Body mass index is 46.97 kg/m.   General: The patient is well-developed and well-nourished and in no acute distress.     Neurologic Exam  Mental status: The patient is alert and oriented x 3 at the time of the examination. The patient has apparent normal recent and remote memory, with an apparently normal attention span and concentration ability.   Speech is normal.  Cranial nerves: Extraocular movements are full.   She had a right APD.  She cannot see colors under the right eye.  She can count fingers and read large letters..    Facial strength and sensation was normal.  The tongue is midline, and the patient has symmetric elevation of the soft palate. No obvious hearing deficits are noted.  Motor:  Muscle bulk is normal.   Tone is normal. Strength is  5 / 5 in all 4 extremities.   Sensory: Sensory testing is intact to pinprick, soft touch and vibration sensation in all 4 extremities.  Coordination: Good finger-nose-finger and heel-to-shin.  Gait and station: Station is normal.   Gait is normal.  Tandem gait is mildly wide.  Romberg is negative.   Reflexes: Deep tendon reflexes are symmetric and normal bilaterally.        DIAGNOSTIC DATA (LABS, IMAGING, TESTING) - I reviewed patient records, labs, notes, testing and imaging myself where available.  Lab Results  Component Value Date   WBC 4.2 10/14/2018   HGB 12.7 10/14/2018   HCT 39.7 10/14/2018   MCV  91.7 10/14/2018   PLT 149 (L) 10/14/2018      Component Value Date/Time   NA 141 10/11/2018 0825   NA 139 02/03/2018 1513   K 3.7 10/11/2018 0825   CL 111 10/11/2018 0825   CO2 23 10/11/2018 0825   GLUCOSE 101 (H) 10/11/2018 0825   BUN 9 10/11/2018 0825   BUN 19 02/03/2018 1513   CREATININE 0.76 10/11/2018 0825  CALCIUM 8.8 (L) 10/11/2018 0825   PROT 5.6 (L) 10/11/2018 0825   PROT 7.6 02/03/2018 1513   ALBUMIN 4.3 10/11/2018 0825   ALBUMIN 4.5 02/03/2018 1513   AST 15 10/11/2018 0825   ALT 10 10/11/2018 0825   ALKPHOS 29 (L) 10/11/2018 0825   BILITOT 0.7 10/11/2018 0825   BILITOT <0.2 02/03/2018 1513   GFRNONAA >60 10/11/2018 0825   GFRAA >60 10/11/2018 0825   Lab Results  Component Value Date   CHOL 197 02/03/2018   HDL 41 02/03/2018   LDLCALC 124 (H) 02/03/2018   TRIG 161 (H) 02/03/2018   CHOLHDL 5.5 (H) 09/28/2015   Lab Results  Component Value Date   HGBA1C 5.7 (H) 02/03/2018   No results found for: VITAMINB12 Lab Results  Component Value Date   TSH 2.460 02/03/2018       ASSESSMENT AND PLAN  Multiple sclerosis ([place]) - Plan: MR BRAIN W WO CONTRAST, CBC with Differential/Platelet  Right optic neuritis  Obstructive sleep apnea syndrome   1.   Continue Tecfidera.  We need to check an MRI of the brain to determine if there is any subclinical progression.  If present, consider a change to a more efficacious disease modifying therapy.  Check CBC today to determine if there is any lymphopenia.   2.   Stay active and exercise as tolerated. 3.    Continue CPAP for OSA.   4.   Return to see me in 4 months or sooner if there are new or worsening neurologic symptoms.  Kendal Ghazarian A. Felecia Shelling, MD, PhD, FAAN Certified in Neurology, Clinical Neurophysiology, Sleep Medicine, Pain Medicine and Neuroimaging Director, [place] [facility] [address], [place], [place] [address] [phone]

## 2018-12-11 ENCOUNTER — Telehealth: Payer: Self-pay | Admitting: Neurology

## 2018-12-11 ENCOUNTER — Telehealth: Payer: Self-pay | Admitting: *Deleted

## 2018-12-11 LAB — CBC WITH DIFFERENTIAL/PLATELET
BASOS: 1 %
Basophils Absolute: 0 10*3/uL (ref 0.0–0.2)
EOS (ABSOLUTE): 0 10*3/uL (ref 0.0–0.4)
Eos: 1 %
Hematocrit: 40.5 % (ref 34.0–46.6)
Hemoglobin: 14 g/dL (ref 11.1–15.9)
Immature Grans (Abs): 0 10*3/uL (ref 0.0–0.1)
Immature Granulocytes: 0 %
LYMPHS ABS: 0.8 10*3/uL (ref 0.7–3.1)
Lymphs: 21 %
MCH: 29.2 pg (ref 26.6–33.0)
MCHC: 34.6 g/dL (ref 31.5–35.7)
MCV: 84 fL (ref 79–97)
MONOCYTES: 7 %
Monocytes Absolute: 0.3 10*3/uL (ref 0.1–0.9)
NEUTROS ABS: 2.7 10*3/uL (ref 1.4–7.0)
NEUTROS PCT: 70 %
Platelets: 209 10*3/uL (ref 150–450)
RBC: 4.8 x10E6/uL (ref 3.77–5.28)
RDW: 13.2 % (ref 12.3–15.4)
WBC: 3.8 10*3/uL (ref 3.4–10.8)

## 2018-12-11 NOTE — Telephone Encounter (Signed)
lvm for pt to call back about scheduling [facility] Auth: [id number] (exp. 12/11/18 to 01/25/19)

## 2018-12-11 NOTE — Telephone Encounter (Signed)
-----   Message from Britt Bottom, MD sent at 12/11/2018  9:37 AM EST ----- Please let the patient know that the lab work is fine.

## 2018-12-11 NOTE — Telephone Encounter (Signed)
Called and LVM for pt letting her know lab work is fine per Dr. Felecia Shelling. Gave [facility] phone number if she has further questions.

## 2018-12-15 NOTE — Telephone Encounter (Signed)
Patient returned my call she is scheduled at [facility] for 12/16/18.

## 2018-12-16 ENCOUNTER — Ambulatory Visit (INDEPENDENT_AMBULATORY_CARE_PROVIDER_SITE_OTHER): Payer: 59

## 2018-12-16 DIAGNOSIS — G35 Multiple sclerosis: Secondary | ICD-10-CM

## 2018-12-16 MED ORDER — GADOBENATE DIMEGLUMINE 529 MG/ML IV SOLN
20.0000 mL | Freq: Once | INTRAVENOUS | Status: AC | PRN
  Administered 2018-12-16: 20 mL via INTRAVENOUS

## 2018-12-17 ENCOUNTER — Telehealth: Payer: Self-pay | Admitting: *Deleted

## 2018-12-17 NOTE — Telephone Encounter (Signed)
-----   Message from Britt Bottom, MD sent at 12/17/2018  2:38 PM EST ----- Please let her know that the MRI of the brain did not show any new lesions.

## 2018-12-17 NOTE — Telephone Encounter (Signed)
I spoke with [facility] and reviewed  below MRI results. She verbalized understanding of same/fim

## 2019-01-04 ENCOUNTER — Other Ambulatory Visit: Payer: Self-pay | Admitting: Physician Assistant

## 2019-01-04 DIAGNOSIS — I10 Essential (primary) hypertension: Secondary | ICD-10-CM

## 2019-01-06 ENCOUNTER — Encounter: Payer: Self-pay | Admitting: Physician Assistant

## 2019-01-06 ENCOUNTER — Ambulatory Visit: Payer: 59 | Admitting: Physician Assistant

## 2019-01-06 VITALS — BP 115/78 | HR 108 | Temp 98.3°F | Resp 20 | Wt 296.0 lb

## 2019-01-06 DIAGNOSIS — R112 Nausea with vomiting, unspecified: Secondary | ICD-10-CM

## 2019-01-06 DIAGNOSIS — A084 Viral intestinal infection, unspecified: Secondary | ICD-10-CM | POA: Diagnosis not present

## 2019-01-06 MED ORDER — PROMETHAZINE HCL 25 MG/ML IJ SOLN
12.5000 mg | Freq: Four times a day (QID) | INTRAMUSCULAR | Status: DC | PRN
  Administered 2019-01-06: 25 mg via INTRAMUSCULAR

## 2019-01-06 MED ORDER — PROMETHAZINE HCL 12.5 MG PO TABS: 12.5000 mg | ORAL_TABLET | Freq: Three times a day (TID) | ORAL | 0 refills | Status: DC | PRN

## 2019-01-06 NOTE — Patient Instructions (Signed)
Viral Gastroenteritis, Adult    Viral gastroenteritis is also known as the stomach flu. This condition is caused by certain germs (viruses). These germs can be passed from person to person very easily (are very contagious). This condition can cause sudden watery poop (diarrhea), fever, and throwing up (vomiting).  Having watery poop and throwing up can make you feel weak and cause you to get dehydrated. Dehydration can make you tired and thirsty, make you have a dry mouth, and make it so you pee (urinate) less often. Older adults and people with other diseases or a weak defense system (immune system) are at higher risk for dehydration. It is important to replace the fluids that you lose from having watery poop and throwing up.  Follow these instructions at home:  Follow instructions from your doctor about how to care for yourself at home.  Eating and drinking  Follow these instructions as told by your doctor:   Take an oral rehydration solution (ORS). This is a drink that is sold at pharmacies and stores.   Drink clear fluids in small amounts as you are able, such as:  ? Water.  ? Ice chips.  ? Diluted fruit juice.  ? Low-calorie sports drinks.   Eat bland, easy-to-digest foods in small amounts as you are able, such as:  ? Bananas.  ? Applesauce.  ? Rice.  ? Low-fat (lean) meats.  ? Toast.  ? Crackers.   Avoid fluids that have a lot of sugar or caffeine in them.   Avoid alcohol.   Avoid spicy or fatty foods.  General instructions     Drink enough fluid to keep your pee (urine) clear or pale yellow.   Wash your hands often. If you cannot use soap and water, use hand sanitizer.   Make sure that all people in your home wash their hands well and often.   Rest at home while you get better.   Take over-the-counter and prescription medicines only as told by your doctor.   Watch your condition for any changes.   Take a warm bath to help with any burning or pain from having watery poop.   Keep all follow-up  visits as told by your doctor. This is important.  Contact a doctor if:   You cannot keep fluids down.   Your symptoms get worse.   You have new symptoms.   You feel light-headed or dizzy.   You have muscle cramps.  Get help right away if:   You have chest pain.   You feel very weak or you pass out (faint).   You see blood in your throw-up.   Your throw-up looks like coffee grounds.   You have bloody or black poop (stools) or poop that look like tar.   You have a very bad headache, a stiff neck, or both.   You have a rash.   You have very bad pain, cramping, or bloating in your belly (abdomen).   You have trouble breathing.   You are breathing very quickly.   Your heart is beating very quickly.   Your skin feels cold and clammy.   You feel confused.   You have pain when you pee.   You have signs of dehydration, such as:  ? Dark pee, hardly any pee, or no pee.  ? Cracked lips.  ? Dry mouth.  ? Sunken eyes.  ? Sleepiness.  ? Weakness.  This information is not intended to replace advice given to you by your   health care provider. Make sure you discuss any questions you have with your health care provider.  Document Released: 06/04/2008 Document Revised: 09/10/2018 Document Reviewed: 08/23/2015  Elsevier Interactive Patient Education  2019 [employer]

## 2019-01-06 NOTE — Progress Notes (Signed)
Patient: Patricia Deleon Female    DOB: 03/20/1961   58 y.o.   MRN: [id number] Visit Date: 01/07/2019  Today's Provider: Trinna Post, PA-C   Chief Complaint  Patient presents with  . URI   Subjective:     HPI Patient here today c/o fever, body ache, nausea,voimiting and diarrhea. Patient reports temp was 101. And is not able to keep food or liquids down. Patient reports that she has been around people with vomiting and diarrhea.    Allergies  Allergen Reactions  . Shellfish Allergy Hives     Current Outpatient Medications:  .  amLODipine (NORVASC) 5 MG tablet, TAKE 1 TABLET BY MOUTH EVERY DAY, Disp: 90 tablet, Rfl: 1 .  Armodafinil (NUVIGIL) 200 MG TABS, One po qAM (Patient taking differently: Take 200 mg by mouth daily as needed. One po qAM), Disp: 30 tablet, Rfl: 5 .  aspirin 81 MG chewable tablet, Chew 81 mg by mouth daily., Disp: , Rfl:  .  Cholecalciferol (VITAMIN D3) 2000 UNITS capsule, Take 2,000 Units by mouth daily. , Disp: , Rfl:  .  DEXILANT 60 MG capsule, TAKE ONE CAPSULE BY MOUTH ONCE DAILY, Disp: 30 capsule, Rfl: 11 .  Dimethyl Fumarate (TECFIDERA) 240 MG CPDR, Take 240 mg by mouth 2 (two) times daily., Disp: , Rfl:  .  DULoxetine (CYMBALTA) 30 MG capsule, TAKE 1 CAPSULE (30 MG TOTAL) BY MOUTH DAILY. TAKE WITH 60MG , Disp: 90 capsule, Rfl: 4 .  DULoxetine (CYMBALTA) 60 MG capsule, TAKE 1 CAPSULE (60 MG TOTAL) BY MOUTH DAILY. TAKE WITH 30MG , Disp: 90 capsule, Rfl: 2 .  ferrous sulfate 325 (65 FE) MG tablet, Take 325 mg by mouth every evening. , Disp: , Rfl:  .  gabapentin (NEURONTIN) 100 MG capsule, TAKE 1 CAPSULE BY MOUTH TWICE A DAY (Patient taking differently: Take 200 mg by mouth at bedtime. ), Disp: 180 capsule, Rfl: 1 .  Glucosamine Sulfate-MSM (MSM/GLUCOSAMINE) 250-250 MG CAPS, Take 1 tablet by mouth 2 (two) times daily. , Disp: , Rfl:  .  levothyroxine (SYNTHROID, LEVOTHROID) 150 MCG tablet, TAKE 1 TABLET BY MOUTH ONCE DAILY BEFORE BREAKFAST  (Patient taking differently: Take 150 mcg by mouth daily before breakfast. ), Disp: 90 tablet, Rfl: 1 .  Omega-3 Fatty Acids (FISH OIL) 500 MG CAPS, Take by mouth., Disp: , Rfl:  .  polyethylene glycol powder (MIRALAX) powder, Take 17 g by mouth at bedtime. , Disp: , Rfl:  .  temazepam (RESTORIL) 30 MG capsule, TAKE ONE CAPSULE BY MOUTH AT BEDTIME AS NEEDED FOR SLEEP, Disp: 90 capsule, Rfl: 1 .  vitamin C (ASCORBIC ACID) 500 MG tablet, Take 500 mg by mouth daily., Disp: , Rfl:  .  promethazine (PHENERGAN) 12.5 MG tablet, Take 1 tablet (12.5 mg total) by mouth every 8 (eight) hours as needed for nausea or vomiting., Disp: 20 tablet, Rfl: 0  Current Facility-Administered Medications:  .  promethazine (PHENERGAN) injection 12.5-25 mg, 12.5-25 mg, Intramuscular, Q6H PRN, Salik Grewell M, PA-C, 25 mg at 01/06/19 1258  Review of Systems  Constitutional: Positive for appetite change, chills and fever.  HENT: Positive for ear pain, rhinorrhea and sore throat. Negative for congestion.   Respiratory: Positive for cough.     Social History   Tobacco Use  . Smoking status: Never Smoker  . Smokeless tobacco: Never Used  Substance Use Topics  . Alcohol use: No    Alcohol/week: 0.0 standard drinks      Objective:  BP 115/78 (BP Location: Left Arm, Patient Position: Sitting, Cuff Size: Normal)   Pulse (!) 108   Temp 98.3 F (36.8 C) (Oral)   Resp 20   Wt 296 lb (134.3 kg)   SpO2 95%   BMI 47.78 kg/m  Vitals:   01/06/19 1225  BP: 115/78  Pulse: (!) 108  Resp: 20  Temp: 98.3 F (36.8 C)  TempSrc: Oral  SpO2: 95%  Weight: 296 lb (134.3 kg)     Physical Exam Constitutional:      Appearance: She is obese. She is ill-appearing.  HENT:     Right Ear: Tympanic membrane and ear canal normal.     Left Ear: Tympanic membrane and ear canal normal.     Mouth/Throat:     Mouth: Mucous membranes are moist.     Pharynx: Oropharynx is clear.  Cardiovascular:     Rate and Rhythm: Normal  rate and regular rhythm.     Heart sounds: Normal heart sounds.  Pulmonary:     Effort: Pulmonary effort is normal.     Breath sounds: Normal breath sounds.  Abdominal:     General: Abdomen is flat. Bowel sounds are normal.     Palpations: Abdomen is soft.     Tenderness: There is no abdominal tenderness.  Neurological:     Mental Status: She is alert.  Psychiatric:        Mood and Affect: Mood normal.        Behavior: Behavior normal.         Assessment & Plan    1. Nausea and vomiting, intractability of vomiting not specified, unspecified vomiting type  IM phenergan in office and PO rx to send home with. Counseled on course of viral gastroenteritis.  - promethazine (PHENERGAN) injection 12.5-25 mg - promethazine (PHENERGAN) 12.5 MG tablet; Take 1 tablet (12.5 mg total) by mouth every 8 (eight) hours as needed for nausea or vomiting.  Dispense: 20 tablet; Refill: 0  2. Viral gastroenteritis  Return if symptoms worsen or fail to improve.  The entirety of the information documented in the History of Present Illness, Review of Systems and Physical Exam were personally obtained by me. Portions of this information were initially documented by Lynford Humphrey, CMA and reviewed by me for thoroughness and accuracy.        Trinna Post, PA-C  [facility]

## 2019-01-07 ENCOUNTER — Encounter: Payer: Self-pay | Admitting: Physician Assistant

## 2019-03-26 ENCOUNTER — Other Ambulatory Visit: Payer: Self-pay | Admitting: Physician Assistant

## 2019-03-26 DIAGNOSIS — K219 Gastro-esophageal reflux disease without esophagitis: Secondary | ICD-10-CM

## 2019-04-02 ENCOUNTER — Telehealth: Payer: Self-pay

## 2019-04-02 NOTE — Telephone Encounter (Signed)
Please advise 

## 2019-04-02 NOTE — Telephone Encounter (Signed)
Patient called office requesting CMA or Patricia Deleon give her a call back when available. Patient states that she has MS and is concerned about coronavirus and wants to know what precautions to take and look out for? I spoke with patient briefly about signs and symptoms but she stated she had questions still for [facility]. Patient can be reached at her home number when available. KW

## 2019-04-03 ENCOUNTER — Emergency Department
Admission: EM | Admit: 2019-04-03 | Discharge: 2019-04-03 | Disposition: A | Discharge status: Home / Self Care | Payer: 59 | Attending: Emergency Medicine | Admitting: Emergency Medicine

## 2019-04-03 ENCOUNTER — Other Ambulatory Visit: Payer: Self-pay

## 2019-04-03 ENCOUNTER — Telehealth: Payer: Self-pay

## 2019-04-03 ENCOUNTER — Encounter: Payer: Self-pay | Admitting: Emergency Medicine

## 2019-04-03 ENCOUNTER — Emergency Department: Payer: 59

## 2019-04-03 DIAGNOSIS — R0789 Other chest pain: Secondary | ICD-10-CM | POA: Diagnosis present

## 2019-04-03 DIAGNOSIS — Z79899 Other long term (current) drug therapy: Secondary | ICD-10-CM | POA: Diagnosis not present

## 2019-04-03 DIAGNOSIS — I10 Essential (primary) hypertension: Secondary | ICD-10-CM | POA: Insufficient documentation

## 2019-04-03 DIAGNOSIS — E039 Hypothyroidism, unspecified: Secondary | ICD-10-CM | POA: Insufficient documentation

## 2019-04-03 HISTORY — DX: Multiple sclerosis: G35

## 2019-04-03 LAB — COMPREHENSIVE METABOLIC PANEL
ALT: 17 U/L (ref 0–44)
AST: 17 U/L (ref 15–41)
Albumin: 4.2 g/dL (ref 3.5–5.0)
Alkaline Phosphatase: 80 U/L (ref 38–126)
Anion gap: 11 (ref 5–15)
BUN: 18 mg/dL (ref 6–20)
CO2: 25 mmol/L (ref 22–32)
Calcium: 9 mg/dL (ref 8.9–10.3)
Chloride: 101 mmol/L (ref 98–111)
Creatinine, Ser: 0.81 mg/dL (ref 0.44–1.00)
GFR calc Af Amer: >60 mL/min (ref >60)
GFR calc non Af Amer: >60 mL/min (ref >60)
Glucose, Bld: 90 mg/dL (ref 70–99)
Potassium: 3.9 mmol/L (ref 3.5–5.1)
Sodium: 137 mmol/L (ref 135–145)
Total Bilirubin: 0.5 mg/dL (ref 0.3–1.2)
Total Protein: 7.5 g/dL (ref 6.5–8.1)

## 2019-04-03 LAB — CBC WITH DIFFERENTIAL/PLATELET
Abs Immature Granulocytes: 0.02 10*3/uL (ref 0.00–0.07)
Basophils Absolute: 0 10*3/uL (ref 0.0–0.1)
Basophils Relative: 1 %
Eosinophils Absolute: 0 10*3/uL (ref 0.0–0.5)
Eosinophils Relative: 1 %
HCT: 40.9 % (ref 36.0–46.0)
Hemoglobin: 13.6 g/dL (ref 12.0–15.0)
Immature Granulocytes: 1 %
Lymphocytes Relative: 13 %
Lymphs Abs: 0.5 10*3/uL — ABNORMAL LOW (ref 0.7–4.0)
MCH: 27.8 pg (ref 26.0–34.0)
MCHC: 33.3 g/dL (ref 30.0–36.0)
MCV: 83.5 fL (ref 80.0–100.0)
Monocytes Absolute: 0.2 10*3/uL (ref 0.1–1.0)
Monocytes Relative: 5 %
Neutro Abs: 3.2 10*3/uL (ref 1.7–7.7)
Neutrophils Relative %: 79 %
Platelets: 185 10*3/uL (ref 150–400)
RBC: 4.9 MIL/uL (ref 3.87–5.11)
RDW: 13.4 % (ref 11.5–15.5)
WBC: 4 10*3/uL (ref 4.0–10.5)
nRBC: 0 % (ref 0.0–0.2)

## 2019-04-03 LAB — TROPONIN I
Troponin I: <0.03 ng/mL (ref <0.03)
Troponin I: <0.03 ng/mL (ref <0.03)

## 2019-04-03 LAB — LIPASE, BLOOD: Lipase: 25 U/L (ref 11–51)

## 2019-04-03 NOTE — ED Provider Notes (Signed)
[facility] Emergency Department Provider Note ____________________________________________   First MD Initiated Contact with Patient 04/03/19 1553     (approximate)  I have reviewed the triage vital signs and the nursing notes.   HISTORY  Chief Complaint Chest Pain    HPI Patricia Deleon is a 58 y.o. female with PMH as noted below who presents with chest pain, acute onset approximately 1 hour ago and now significantly improved.  It is described as pressure-like and started in the substernal and epigastric area.  She states it radiated to her back and to her neck.  It was associated with some lightheadedness, but she denies any shortness of breath or nausea.  The patient has had a mild nonproductive cough over the last several days but no fever or chills.  She has no history of exposures to anyone at high risk for COVID-19.     Past Medical History:  Diagnosis Date  . Agoraphobia with panic disorder   . Anemia   . Depression   . Erosive gastritis   . Fundic gland polyps of stomach, benign   . Geographic tongue   . GERD (gastroesophageal reflux disease)   . History of esophagogastroduodenoscopy (EGD)   . Hypercholesterolemia   . Hypertension   . Intervertebral disc disorder    thoracic,thoracolumber,lumbosacral  . Multiple sclerosis ([place])   . Obesity   . Sleep apnea     Patient Active Problem List   Diagnosis Date Noted  . Right optic neuritis 08/25/2018  . Excessive daytime sleepiness 08/25/2018  . Multiple sclerosis ([place]) 07/08/2018  . Neck pain 07/08/2018  . Transient vision disturbance of left eye 06/18/2018  . Headache 06/18/2018  . Urge incontinence 06/18/2018  . Numbness 06/18/2018  . Acne rosacea 05/07/2016  . Agoraphobia with panic disorder 09/28/2015  . Clinical depression 09/28/2015  . 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 Acid reflux 09/28/2015  . Displacement of lumbar intervertebral disc without myelopathy 09/28/2015  .  Hypercholesteremia 09/28/2015  . Insomnia 09/28/2015  . Thoracic, 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  . Anemia, iron deficiency 09/28/2015  . Adiposity 09/28/2015  . Episodic paroxysmal anxiety disorder 09/28/2015  . Borderline diabetes 09/28/2015  . Restless leg 09/28/2015  . Apnea, sleep 09/28/2015  . Avitaminosis D 09/28/2015  . Hypothyroidism 07/11/2015  . Hypertension 06/09/2015    Past Surgical History:  Procedure Laterality Date  . COLONOSCOPY    . COLONOSCOPY WITH PROPOFOL N/A 07/07/2018   Procedure: COLONOSCOPY WITH PROPOFOL;  Surgeon: Lollie Sails, MD;  Location: [facility] ENDOSCOPY;  Service: Endoscopy;  Laterality: N/A;  . ESOPHAGOGASTRODUODENOSCOPY (EGD) WITH PROPOFOL N/A 01/31/2016   Procedure: ESOPHAGOGASTRODUODENOSCOPY (EGD) WITH PROPOFOL;  Surgeon: Lollie Sails, MD;  Location: [facility] ENDOSCOPY;  Service: Endoscopy;  Laterality: N/A;  . IR FLUORO GUIDE CV LINE RIGHT  10/06/2018  . IR REMOVAL TUN CV CATH W/O FL  10/15/2018  . IR US GUIDE VASC ACCESS RIGHT  10/06/2018  . NISSEN FUNDOPLICATION      Prior to Admission medications   Medication Sig Start Date End Date Taking? Authorizing Provider  amLODipine (NORVASC) 5 MG tablet TAKE 1 TABLET BY MOUTH EVERY DAY 01/04/19   Mar Daring, PA-C  Armodafinil (NUVIGIL) 200 MG TABS One po qAM Patient taking differently: Take 200 mg by mouth daily as needed. One po qAM 08/25/18   Sater, Nanine Means, MD  aspirin 81 MG chewable tablet Chew 81 mg by mouth daily.    [provider]  Cholecalciferol (  VITAMIN D3) 2000 UNITS capsule Take 2,000 Units by mouth daily.     [provider]  DEXILANT 60 MG capsule TAKE ONE CAPSULE BY MOUTH ONCE DAILY 03/27/19   Mar Daring, PA-C  Dimethyl Fumarate (TECFIDERA) 240 MG CPDR Take 240 mg by mouth 2 (two) times daily.    [provider]  DULoxetine (CYMBALTA) 30 MG capsule TAKE 1 CAPSULE (30 MG TOTAL) BY MOUTH DAILY. TAKE  WITH 60MG  09/15/18   Mar Daring, PA-C  DULoxetine (CYMBALTA) 60 MG capsule TAKE 1 CAPSULE (60 MG TOTAL) BY MOUTH DAILY. TAKE WITH 30MG  09/15/18   Mar Daring, PA-C  ferrous sulfate 325 (65 FE) MG tablet Take 325 mg by mouth every evening.     [provider]  gabapentin (NEURONTIN) 100 MG capsule TAKE 1 CAPSULE BY MOUTH TWICE A DAY Patient taking differently: Take 200 mg by mouth at bedtime.  10/10/18   Mar Daring, PA-C  Glucosamine Sulfate-MSM (MSM/GLUCOSAMINE) 250-250 MG CAPS Take 1 tablet by mouth 2 (two) times daily.     [provider]  levothyroxine (SYNTHROID, LEVOTHROID) 150 MCG tablet TAKE 1 TABLET BY MOUTH ONCE DAILY BEFORE BREAKFAST Patient taking differently: Take 150 mcg by mouth daily before breakfast.  08/15/18   Mar Daring, PA-C  Omega-3 Fatty Acids (FISH OIL) 500 MG CAPS Take by mouth.    [provider]  polyethylene glycol powder (MIRALAX) powder Take 17 g by mouth at bedtime.     [provider]  promethazine (PHENERGAN) 12.5 MG tablet Take 1 tablet (12.5 mg total) by mouth every 8 (eight) hours as needed for nausea or vomiting. 01/06/19   Trinna Post, PA-C  temazepam (RESTORIL) 30 MG capsule TAKE ONE CAPSULE BY MOUTH AT BEDTIME AS NEEDED FOR SLEEP 10/10/18   Mar Daring, PA-C  vitamin C (ASCORBIC ACID) 500 MG tablet Take 500 mg by mouth daily.    [provider]    Allergies Shellfish allergy  Family History  Problem Relation Age of Onset  . Lung cancer Mother   . Hypertension Mother   . Alcohol abuse Mother   . Anxiety disorder Mother   . Hypertension Father   . Arthritis Father   . Alcohol abuse Father   . Esophageal cancer Father   . Alcohol abuse Brother   . Hypertension Brother   . Autism Son   . Coronary artery disease Maternal Grandfather   . Heart attack Maternal Grandfather   . CVA Paternal Grandmother   . Skin cancer Paternal Grandmother   . Cancer Maternal  Grandmother     Social History Social History   Tobacco Use  . Smoking status: Never Smoker  . Smokeless tobacco: Never Used  Substance Use Topics  . Alcohol use: No    Alcohol/week: 0.0 standard drinks  . Drug use: No    Review of Systems  Constitutional: No fever. Eyes: No redness. ENT: No sore throat. Cardiovascular: Positive for chest pain. Respiratory: Denies shortness of breath. Gastrointestinal: No vomiting or diarrhea.  Genitourinary: Negative for flank pain. Musculoskeletal: Negative for back pain. Skin: Negative for rash. Neurological: Negative for headache.   ____________________________________________   PHYSICAL EXAM:  VITAL SIGNS: ED Triage Vitals  Enc Vitals Group     BP 04/03/19 1543 (!) 144/85     Pulse Rate 04/03/19 1543 92     Resp 04/03/19 1543 20     Temp 04/03/19 1543 98.6 F (37 C)     Temp  Source 04/03/19 1543 Oral     SpO2 04/03/19 1543 99 %     Weight 04/03/19 1541 300 lb (136.1 kg)     Height 04/03/19 1541 5\' 6"  (1.676 m)     Head Circumference --      Peak Flow --      Pain Score 04/03/19 1541 1     Pain Loc --      Pain Edu? --      Excl. in [place]? --     Constitutional: Alert and oriented.  Relatively well appearing and in no acute distress. Eyes: Conjunctivae are normal.  Head: Atraumatic. Nose: No congestion/rhinnorhea. Mouth/Throat: Mucous membranes are moist.   Neck: Normal range of motion.  Cardiovascular: Normal rate, regular rhythm. Grossly normal heart sounds.  Good peripheral circulation. Respiratory: Normal respiratory effort.  No retractions. Lungs CTAB. Gastrointestinal: No distention.  Musculoskeletal: Extremities warm and well perfused.  Neurologic:  Normal speech and language. No gross focal neurologic deficits are appreciated.  Skin:  Skin is warm and dry. No rash noted. Psychiatric: Mood and affect are normal. Speech and behavior are normal.  ____________________________________________   LABS (all labs  ordered are listed, but only abnormal results are displayed)  Labs Reviewed  CBC WITH DIFFERENTIAL/PLATELET - Abnormal; Notable for the following components:      Result Value   Lymphs Abs 0.5 (*)    All other components within normal limits  COMPREHENSIVE METABOLIC PANEL  LIPASE, BLOOD  TROPONIN I  TROPONIN I   ____________________________________________  EKG  ED ECG REPORT I, Arta Silence, the attending physician, personally viewed and interpreted this ECG.  Date: 04/03/2019 EKG Time: 1041 Rate: 89 Rhythm: normal sinus rhythm QRS Axis: normal Intervals: normal ST/T Wave abnormalities: normal Narrative Interpretation: no evidence of acute ischemia  ____________________________________________  RADIOLOGY  CXR: Cardiomegaly with no acute abnormalities  ____________________________________________   PROCEDURES  Procedure(s) performed: No  Procedures  Critical Care performed:No ____________________________________________   INITIAL IMPRESSION / ASSESSMENT AND PLAN / ED COURSE  Pertinent labs & imaging results that were available during my care of the patient were reviewed by me and considered in my medical decision making (see chart for details).  58 year old female with PMH as noted above presents with acute onset of chest pain radiating to her neck and back and now mostly resolved.  She reports some mild associated lightheadedness.  She has had a cough for the last few days but no fever or shortness of breath.  She denies any prior history of similar pain and has no cardiac history.  On exam the patient is overall well-appearing and her vital signs are normal.  The remainder of the exam is unremarkable.  EKG is nonischemic.  Overall presentation is consistent with a benign etiology such as GERD or musculoskeletal pain.  Differential includes less likely ACS or hepatobiliary etiology.  Although I cannot apply PERC due to the patient's age, she has no PE  risk factors, no signs or symptoms of DVT, and her clinical presentation is not consistent with PE.  Although she reports pain radiating to the back, given that he has almost completely resolved on its own as well as her well appearance and stable vital signs, there is no clinical evidence for aortic dissection or other vascular etiology.  We will obtain chest x-ray, basic and hepatobiliary labs, troponin, and reassess.  ----------------------------------------- 7:57 PM on 04/03/2019 -----------------------------------------  Chest x-ray and labs are unremarkable.  Initial troponin was negative and a repeat troponin approximately  4 hours after the onset of symptoms was also negative.  The patient has had no significant pain during her ED stay and is asymptomatic at this time.  There is no evidence of ACS or other concerning acute cause of the pain.  I suspect most likely GERD.  She is stable for discharge at this time.  She feels comfortable going home.  Return precautions given, and she expresses understanding.  ____________________________________________   FINAL CLINICAL IMPRESSION(S) / ED DIAGNOSES  Final diagnoses:  Atypical chest pain      NEW MEDICATIONS STARTED DURING THIS VISIT:  New Prescriptions   No medications on file     Note:  This document was prepared using Dragon voice recognition software and may include unintentional dictation errors.    Arta Silence, MD 04/03/19 1958

## 2019-04-03 NOTE — Telephone Encounter (Signed)
FYI... Pt called complaining of sudden onset of chest pain/pressure.  She states she also feels "Clammy" and pt's family states she looks "Very Pale".  Pt denies nausea, vomiting, shortness of breath.  I advised her she should go to the ER to be evaluated.  She agreed and started her husband is going to drive her.   Thanks,   -Mickel Baas

## 2019-04-03 NOTE — Discharge Instructions (Addendum)
Return to the ER for new, worsening, or persistent severe chest pain, difficulty breathing, weakness or lightheadedness, or any other new or worsening symptoms that concern you. 

## 2019-04-03 NOTE — ED Triage Notes (Signed)
Here for central chest pain that went straight through to the back.  Started 40 min ago and was sharp and severe.  Pain has eased some but is constant.  Has had mild cough and hard to get deep breath.  Unlabored. VSS.

## 2019-04-03 NOTE — Telephone Encounter (Signed)
Agreed -

## 2019-04-07 NOTE — Telephone Encounter (Signed)
Patient coming in to office on 04/09/19

## 2019-04-09 ENCOUNTER — Encounter: Payer: Self-pay | Admitting: Physician Assistant

## 2019-04-09 ENCOUNTER — Ambulatory Visit (INDEPENDENT_AMBULATORY_CARE_PROVIDER_SITE_OTHER): Payer: 59 | Admitting: Physician Assistant

## 2019-04-09 VITALS — BP 145/79 | Temp 96.9°F | Wt 298.0 lb

## 2019-04-09 DIAGNOSIS — K269 Duodenal ulcer, unspecified as acute or chronic, without hemorrhage or perforation: Secondary | ICD-10-CM | POA: Diagnosis not present

## 2019-04-09 MED ORDER — SUCRALFATE 1 G PO TABS: 1.0000 g | ORAL_TABLET | Freq: Three times a day (TID) | ORAL | 0 refills | Status: DC

## 2019-04-09 NOTE — Progress Notes (Signed)
Virtual Visit via Video Note  I connected with [name] on 04/09/19 at 11:20 AM EDT by a video enabled telemedicine application and verified that I am speaking with the correct person using two identifiers.   I discussed the limitations of evaluation and management by telemedicine and the availability of in person appointments. The patient expressed understanding and agreed to proceed.   Mar Daring, PA-C    Patient: Patricia Deleon Female    DOB: 1961-01-01   58 y.o.   MRN: [id number] Visit Date: 04/09/2019  Today's Provider: Mar Daring, PA-C   Chief Complaint  Patient presents with  . Hospitalization Follow-up  . Virtual Visit   Subjective:     HPI   Follow Up ER Visit  Patient is here for ER follow up.  She was recently seen at [facility] for Atypical chest pain on 04/03/2019. Treatment for this included CXR,EKG,Labs She reports good compliance with treatment. She reports this condition is Improved.  Patient reports that she had been at work and was feeling well. She was on her way home and stopped by [facility] for supper. She was in the drive-thru, not ordered and had the pain hit. It was located in the mid-substernal region, radiated slightly to the left and through towards the back. She turned around in the drive-thru, went home, called her husband to meet her and take her to the ER. By the time she had been taken back in the ER the sharp pain was subsiding but she still continued to have a dull ache. She reports during the episode she was pale and clammy. No nausea. ER work up was benign and she was sent home. She does have chronic GERD and hiatal hernia but states this was completely different than the GERD she has ever felt before. She does take dexilant daily and has for years.  ------------------------------------------------------------------------------------   Allergies  Allergen Reactions  . Shellfish Allergy Hives     Current  Outpatient Medications:  .  amLODipine (NORVASC) 5 MG tablet, TAKE 1 TABLET BY MOUTH EVERY DAY, Disp: 90 tablet, Rfl: 1 .  Armodafinil (NUVIGIL) 200 MG TABS, One po qAM (Patient taking differently: Take 200 mg by mouth daily as needed. One po qAM), Disp: 30 tablet, Rfl: 5 .  aspirin 81 MG chewable tablet, Chew 81 mg by mouth daily., Disp: , Rfl:  .  Cholecalciferol (VITAMIN D3) 2000 UNITS capsule, Take 2,000 Units by mouth daily. , Disp: , Rfl:  .  DEXILANT 60 MG capsule, TAKE ONE CAPSULE BY MOUTH ONCE DAILY, Disp: 90 capsule, Rfl: 3 .  Dimethyl Fumarate (TECFIDERA) 240 MG CPDR, Take 240 mg by mouth 2 (two) times daily., Disp: , Rfl:  .  DULoxetine (CYMBALTA) 30 MG capsule, TAKE 1 CAPSULE (30 MG TOTAL) BY MOUTH DAILY. TAKE WITH 60MG , Disp: 90 capsule, Rfl: 4 .  DULoxetine (CYMBALTA) 60 MG capsule, TAKE 1 CAPSULE (60 MG TOTAL) BY MOUTH DAILY. TAKE WITH 30MG , Disp: 90 capsule, Rfl: 2 .  ferrous sulfate 325 (65 FE) MG tablet, Take 325 mg by mouth every evening. , Disp: , Rfl:  .  gabapentin (NEURONTIN) 100 MG capsule, TAKE 1 CAPSULE BY MOUTH TWICE A DAY (Patient taking differently: Take 200 mg by mouth at bedtime. ), Disp: 180 capsule, Rfl: 1 .  Glucosamine Sulfate-MSM (MSM/GLUCOSAMINE) 250-250 MG CAPS, Take 1 tablet by mouth 2 (two) times daily. , Disp: , Rfl:  .  levothyroxine (SYNTHROID, LEVOTHROID) 150 MCG tablet, TAKE  1 TABLET BY MOUTH ONCE DAILY BEFORE BREAKFAST (Patient taking differently: Take 150 mcg by mouth daily before breakfast. ), Disp: 90 tablet, Rfl: 1 .  Omega-3 Fatty Acids (FISH OIL) 500 MG CAPS, Take by mouth., Disp: , Rfl:  .  polyethylene glycol powder (MIRALAX) powder, Take 17 g by mouth at bedtime. , Disp: , Rfl:  .  promethazine (PHENERGAN) 12.5 MG tablet, Take 1 tablet (12.5 mg total) by mouth every 8 (eight) hours as needed for nausea or vomiting., Disp: 20 tablet, Rfl: 0 .  temazepam (RESTORIL) 30 MG capsule, TAKE ONE CAPSULE BY MOUTH AT BEDTIME AS NEEDED FOR SLEEP, Disp: 90  capsule, Rfl: 1 .  vitamin C (ASCORBIC ACID) 500 MG tablet, Take 500 mg by mouth daily., Disp: , Rfl:   Current Facility-Administered Medications:  .  promethazine (PHENERGAN) injection 12.5-25 mg, 12.5-25 mg, Intramuscular, Q6H PRN, Pollak, Adriana M, PA-C, 25 mg at 01/06/19 1258  Review of Systems  Constitutional: Negative.   Respiratory: Negative.   Cardiovascular: Negative.   Gastrointestinal: Negative.   Neurological: Negative.   Psychiatric/Behavioral: Negative.     Social History   Tobacco Use  . Smoking status: Never Smoker  . Smokeless tobacco: Never Used  Substance Use Topics  . Alcohol use: No    Alcohol/week: 0.0 standard drinks      Objective:   BP (!) 145/79 (BP Location: Left Arm, Patient Position: Sitting, Cuff Size: Large)   Temp (!) 96.9 F (36.1 C) (Oral)   Wt 298 lb (135.2 kg)   BMI 48.10 kg/m  Vitals:   04/09/19 1106  BP: (!) 145/79  Temp: (!) 96.9 F (36.1 C)  TempSrc: Oral  Weight: 298 lb (135.2 kg)     Physical Exam Vitals signs reviewed.  Constitutional:      General: She is not in acute distress.    Appearance: Normal appearance. She is well-developed. She is obese. She is not ill-appearing.  HENT:     Head: Normocephalic and atraumatic.  Neck:     Musculoskeletal: Normal range of motion and neck supple.  Pulmonary:     Effort: Pulmonary effort is normal. No respiratory distress.  Neurological:     Mental Status: She is alert.  Psychiatric:        Mood and Affect: Mood normal.        Behavior: Behavior normal.        Thought Content: Thought content normal.        Judgment: Judgment normal.         Assessment & Plan    1. Duodenal ulcer disease Due to symptoms possibly duodenal ulcer vs panic attack vs cardiac. Will start with sucralfate as below. She is to call if symptoms recur and will refer to cardiology for stress testing. She is in agreement.  - sucralfate (CARAFATE) 1 g tablet; Take 1 tablet (1 g total) by mouth 4  (four) times daily -  with meals and at bedtime.  Dispense: 120 tablet; Refill: 0    I discussed the assessment and treatment plan with the patient. The patient was provided an opportunity to ask questions and all were answered. The patient agreed with the plan and demonstrated an understanding of the instructions.   The patient was advised to call back or seek an in-person evaluation if the symptoms worsen or if the condition fails to improve as anticipated.  I provided 21 minutes of non-face-to-face time during this encounter.  Mar Daring, PA-C  [facility]  [place]

## 2019-04-14 ENCOUNTER — Telehealth: Payer: Self-pay | Admitting: *Deleted

## 2019-04-14 NOTE — Telephone Encounter (Signed)
Called, LVM letting pt know Raquel Sarna and I have been trying to reach her. due to current COVID-19 pandemic, our office is severely reducing in office visits in order to minimize the risk to our patients and healthcare providers. With that said, we would like to get her transitioned to a virtual visit, we need her consent and to file via insurance.   If ok, we need her email:_________  I also need to update her chart information.  Explained she would need to download [employer] web[employer] meetings app either on a smart phone or computer with a camera.  Explained she then needs to access email where there will be an access code and password and a green join meeting button she would press a few minutes prior to appt.

## 2019-04-15 ENCOUNTER — Ambulatory Visit: Payer: 59 | Admitting: Neurology

## 2019-04-15 NOTE — Telephone Encounter (Signed)
Called, LVM for husband (on DPR) explaining Raquel Sarna and I have been trying to reach [place] about her appt today. Asked he have her call us back at [phone].   I also tried calling [facility] again, went to VM.

## 2019-05-03 ENCOUNTER — Other Ambulatory Visit: Payer: Self-pay | Admitting: Physician Assistant

## 2019-05-03 DIAGNOSIS — K269 Duodenal ulcer, unspecified as acute or chronic, without hemorrhage or perforation: Secondary | ICD-10-CM

## 2019-05-18 ENCOUNTER — Other Ambulatory Visit: Payer: Self-pay | Admitting: Physician Assistant

## 2019-05-18 DIAGNOSIS — K269 Duodenal ulcer, unspecified as acute or chronic, without hemorrhage or perforation: Secondary | ICD-10-CM

## 2019-05-18 NOTE — Telephone Encounter (Signed)
Pt needs a refill on   Carafate 1 gr   [facility]  CB#  [phone] Thanks Con Memos

## 2019-05-18 NOTE — Telephone Encounter (Signed)
Please review for Jenni.   Thanks,   -  

## 2019-05-20 MED ORDER — SUCRALFATE 1 G PO TABS: 1.0000 g | ORAL_TABLET | Freq: Three times a day (TID) | ORAL | 0 refills | Status: DC

## 2019-05-28 ENCOUNTER — Ambulatory Visit: Payer: 59 | Admitting: Physician Assistant

## 2019-05-28 ENCOUNTER — Other Ambulatory Visit: Payer: Self-pay

## 2019-05-28 ENCOUNTER — Encounter: Payer: Self-pay | Admitting: Physician Assistant

## 2019-05-28 VITALS — BP 133/80 | HR 84 | Temp 98.4°F | Resp 16 | Wt 304.0 lb

## 2019-05-28 DIAGNOSIS — G35 Multiple sclerosis: Secondary | ICD-10-CM | POA: Diagnosis not present

## 2019-05-28 DIAGNOSIS — F331 Major depressive disorder, recurrent, moderate: Secondary | ICD-10-CM | POA: Insufficient documentation

## 2019-05-28 DIAGNOSIS — Z6841 Body Mass Index (BMI) 40.0 and over, adult: Secondary | ICD-10-CM

## 2019-05-28 DIAGNOSIS — B370 Candidal stomatitis: Secondary | ICD-10-CM

## 2019-05-28 DIAGNOSIS — K269 Duodenal ulcer, unspecified as acute or chronic, without hemorrhage or perforation: Secondary | ICD-10-CM | POA: Diagnosis not present

## 2019-05-28 MED ORDER — SUCRALFATE 1 G PO TABS: 1.0000 g | ORAL_TABLET | Freq: Three times a day (TID) | ORAL | 0 refills | Status: DC

## 2019-05-28 MED ORDER — NYSTATIN 100000 UNIT/ML MT SUSP: 5.0000 mL | Freq: Four times a day (QID) | OROMUCOSAL | 0 refills | Status: DC

## 2019-05-28 NOTE — Progress Notes (Signed)
Patient: Patricia Deleon Female    DOB: 1961-10-19   58 y.o.   MRN: [id number] Visit Date: 05/28/2019  Today's Provider: Mar Daring, PA-C   No chief complaint on file.  Subjective:     HPI   Duodenal ulcer disease From 04/09/2019-started with sucralfate. She is to call if symptoms recur and will refer to cardiology for stress testing. She is in agreement.   Symptoms improving slightly with sucralfate. Also notes to have been having some irritation in the back of her throat. Reports redness, some soreness and a film on her tongue.   She also mentions her depression has been worsening with Covid-19. She has been working but reports increased stress at work. She has also been worried about her daughter because she has been coughing and works for a [employer]. She also has an older son that is autistic. Plus with her fairly recent MS diagnosis over the last year she has just been having a hard time.    Allergies  Allergen Reactions  . Shellfish Allergy Hives     Current Outpatient Medications:  .  amLODipine (NORVASC) 5 MG tablet, TAKE 1 TABLET BY MOUTH EVERY DAY, Disp: 90 tablet, Rfl: 1 .  Armodafinil (NUVIGIL) 200 MG TABS, One po qAM (Patient taking differently: Take 200 mg by mouth daily as needed. One po qAM), Disp: 30 tablet, Rfl: 5 .  aspirin 81 MG chewable tablet, Chew 81 mg by mouth daily., Disp: , Rfl:  .  Cholecalciferol (VITAMIN D3) 2000 UNITS capsule, Take 2,000 Units by mouth daily. , Disp: , Rfl:  .  DEXILANT 60 MG capsule, TAKE ONE CAPSULE BY MOUTH ONCE DAILY, Disp: 90 capsule, Rfl: 3 .  Dimethyl Fumarate (TECFIDERA) 240 MG CPDR, Take 240 mg by mouth 2 (two) times daily., Disp: , Rfl:  .  ferrous sulfate 325 (65 FE) MG tablet, Take 325 mg by mouth every evening. , Disp: , Rfl:  .  gabapentin (NEURONTIN) 100 MG capsule, TAKE 1 CAPSULE BY MOUTH TWICE A DAY (Patient taking differently: Take 200 mg by mouth at bedtime. ), Disp: 180 capsule, Rfl:  1 .  Glucosamine Sulfate-MSM (MSM/GLUCOSAMINE) 250-250 MG CAPS, Take 1 tablet by mouth 2 (two) times daily. , Disp: , Rfl:  .  levothyroxine (SYNTHROID, LEVOTHROID) 150 MCG tablet, TAKE 1 TABLET BY MOUTH ONCE DAILY BEFORE BREAKFAST (Patient taking differently: Take 150 mcg by mouth daily before breakfast. ), Disp: 90 tablet, Rfl: 1 .  Omega-3 Fatty Acids (FISH OIL) 500 MG CAPS, Take by mouth., Disp: , Rfl:  .  polyethylene glycol powder (MIRALAX) powder, Take 17 g by mouth at bedtime. , Disp: , Rfl:  .  promethazine (PHENERGAN) 12.5 MG tablet, Take 1 tablet (12.5 mg total) by mouth every 8 (eight) hours as needed for nausea or vomiting., Disp: 20 tablet, Rfl: 0 .  sucralfate (CARAFATE) 1 g tablet, Take 1 tablet (1 g total) by mouth 4 (four) times daily -  with meals and at bedtime., Disp: 120 tablet, Rfl: 0 .  traZODone (DESYREL) 50 MG tablet, , Disp: , Rfl:  .  venlafaxine XR (EFFEXOR-XR) 37.5 MG 24 hr capsule, , Disp: , Rfl:  .  vitamin C (ASCORBIC ACID) 500 MG tablet, Take 500 mg by mouth daily., Disp: , Rfl:  .  DULoxetine (CYMBALTA) 30 MG capsule, TAKE 1 CAPSULE (30 MG TOTAL) BY MOUTH DAILY. TAKE WITH 60MG  (Patient not taking: Reported on 05/28/2019), Disp: 90 capsule, Rfl:  4 .  DULoxetine (CYMBALTA) 60 MG capsule, TAKE 1 CAPSULE (60 MG TOTAL) BY MOUTH DAILY. TAKE WITH 30MG  (Patient not taking: Reported on 05/28/2019), Disp: 90 capsule, Rfl: 2 .  temazepam (RESTORIL) 30 MG capsule, TAKE ONE CAPSULE BY MOUTH AT BEDTIME AS NEEDED FOR SLEEP (Patient not taking: Reported on 05/28/2019), Disp: 90 capsule, Rfl: 1  Current Facility-Administered Medications:  .  promethazine (PHENERGAN) injection 12.5-25 mg, 12.5-25 mg, Intramuscular, Q6H PRN, Pollak, Adriana M, PA-C, 25 mg at 01/06/19 1258  Review of Systems  Constitutional: Negative for appetite change, chills, fatigue and fever.  HENT: Positive for mouth sores.   Respiratory: Negative for chest tightness and shortness of breath.   Cardiovascular:  Negative for chest pain and palpitations.  Gastrointestinal: Negative for abdominal pain, nausea and vomiting.  Neurological: Negative for dizziness and weakness.  Psychiatric/Behavioral: Positive for agitation, dysphoric mood and sleep disturbance. The patient is nervous/anxious.     Social History   Tobacco Use  . Smoking status: Never Smoker  . Smokeless tobacco: Never Used  Substance Use Topics  . Alcohol use: No    Alcohol/week: 0.0 standard drinks      Objective:   BP 133/80 (BP Location: Left Arm, Patient Position: Sitting, Cuff Size: Large)   Pulse 84   Temp 98.4 F (36.9 C) (Oral)   Resp 16   Wt (!) 304 lb (137.9 kg)   SpO2 95%   BMI 49.07 kg/m  Vitals:   05/28/19 1512  BP: 133/80  Pulse: 84  Resp: 16  Temp: 98.4 F (36.9 C)  TempSrc: Oral  SpO2: 95%  Weight: (!) 304 lb (137.9 kg)     Physical Exam Vitals signs reviewed.  Constitutional:      General: She is not in acute distress.    Appearance: Normal appearance. She is well-developed. She is obese. She is not ill-appearing or diaphoretic.  HENT:     Mouth/Throat:     Mouth: Oral lesions present.     Comments: Erythema of the cheeks and tongue with a white film covering the tongue Cardiovascular:     Rate and Rhythm: Normal rate and regular rhythm.     Heart sounds: Normal heart sounds. No murmur. No friction rub. No gallop.   Pulmonary:     Effort: Pulmonary effort is normal. No respiratory distress.     Breath sounds: Normal breath sounds. No wheezing or rales.  Abdominal:     General: Bowel sounds are normal. There is no distension.     Palpations: Abdomen is soft. There is no mass.     Tenderness: There is abdominal tenderness in the epigastric area. There is no guarding or rebound.  Skin:    General: Skin is warm and dry.  Neurological:     Mental Status: She is alert and oriented to person, place, and time.        Assessment & Plan    1. Duodenal ulcer disease Will continue  Sucralfate x 1 month since symptoms are improving but not completely resolved yet.  - sucralfate (CARAFATE) 1 g tablet; Take 1 tablet (1 g total) by mouth 4 (four) times daily -  with meals and at bedtime.  Dispense: 120 tablet; Refill: 0  2. Thrush Nystatin given for thrush as below. Call if no improvement.  - nystatin (MYCOSTATIN) 100000 UNIT/ML suspension; Take 5 mLs (500,000 Units total) by mouth 4 (four) times daily.  Dispense: 240 mL; Refill: 0  3. Class 3 severe obesity due to excess  calories with serious comorbidity and body mass index (BMI) of 45.0 to 49.9 in adult [facility]) Counseled patient on healthy lifestyle modifications including dieting and exercise.   4. Multiple sclerosis (HCC) Stable. Followed by Neurology and Ophthalmology.   5. Moderate episode of recurrent major depressive disorder ([place]) Seeing counseling. Continue Venlafaxine XR 37.5mg  and trazodone 50mg  nightly.      Mar Daring, PA-C  [facility]

## 2019-05-28 NOTE — Patient Instructions (Signed)
Peptic Ulcer  A peptic ulcer is a painful sore in the lining of your stomach or the first part of your small intestine. What are the causes? Common causes of this condition include:  An infection.  Using certain pain medicines too often or too much. What increases the risk? You are more likely to get this condition if you:  Smoke.  Have a family history of ulcer disease.  Drink alcohol.  Have been hospitalized in an intensive care unit (ICU). What are the signs or symptoms? Symptoms include:  Burning pain in the area between the chest and the belly button. The pain may: ? Not go away (be persistent). ? Be worse when your stomach is empty. ? Be worse at night.  Heartburn.  Feeling sick to your stomach (nauseous) and throwing up (vomiting).  Bloating. If the ulcer results in bleeding, it can cause you to:  Have poop (stool) that is black and looks like tar.  Throw up bright red blood.  Throw up material that looks like coffee grounds. How is this treated? Treatment for this condition may include:  Stopping things that can cause the ulcer, such as: ? Smoking. ? Using pain medicines.  Medicines to reduce stomach acid.  Antibiotic medicines if the ulcer is caused by an infection.  A procedure that is done using a small, flexible tube that has a camera at the end (upper endoscopy). This may be done if you have a bleeding ulcer.  Surgery. This may be needed if: ? You have a lot of bleeding. ? The ulcer caused a hole somewhere in the digestive system. Follow these instructions at home:  Do not drink alcohol if your doctor tells you not to drink.  Limit how much caffeine you take in.  Do not use any products that contain nicotine or tobacco, such as cigarettes, e-cigarettes, and chewing tobacco. If you need help quitting, ask your doctor.  Take over-the-counter and prescription medicines only as told by your doctor. ? Do not stop or change your medicines unless  you talk with your doctor about it first. ? Do not take aspirin, ibuprofen, or other NSAIDs unless your doctor told you to do so.  Keep all follow-up visits as told by your doctor. This is important. Contact a doctor if:  You do not get better in 7 days after you start treatment.  You keep having an upset stomach (indigestion) or heartburn. Get help right away if:  You have sudden, sharp pain in your belly (abdomen).  You have belly pain that does not go away.  You have bloody poop (stool) or black, tarry poop.  You throw up blood. It may look like coffee grounds.  You feel light-headed or feel like you may pass out (faint).  You get weak.  You get sweaty or feel sticky and cold to the touch (clammy). Summary  Symptoms of a peptic ulcer include burning pain in the area between the chest and the belly button.  Take medicines only as told by your doctor.  Limit how much alcohol and caffeine you have.  Keep all follow-up visits as told by your doctor. This information is not intended to replace advice given to you by your health care provider. Make sure you discuss any questions you have with your health care provider. Document Released: 03/13/2010 Document Revised: 06/24/2018 Document Reviewed: 06/24/2018 Elsevier Interactive Patient Education  2019 [place], Adult  Oral thrush is an infection in your mouth and throat. It  causes white patches on your tongue and in your mouth. Follow these instructions at home: Helping with soreness   To lessen your pain: ? Drink cold liquids, like water and iced tea. ? Eat frozen ice pops or frozen juices. ? Eat foods that are easy to swallow, like gelatin and ice cream. ? Drink from a straw if the patches in your mouth are painful. General instructions  Take or use over-the-counter and prescription medicines only as told by your doctor. Medicine for oral thrush may be something to swallow, or it may be something to  put on the infected area.  Eat plain yogurt that has live cultures in it. Read the label to make sure.  If you wear dentures: ? Take out your dentures before you go to bed. ? Brush them well. ? Soak them in a denture cleaner.  Rinse your mouth with warm salt-water many times a day. To make the salt-water mixture, completely dissolve 1/2-1 teaspoon of salt in 1 cup of warm water. Contact a doctor if:  Your problems are getting worse.  Your problems do not get better in less than 7 days with treatment.  Your infection is spreading. This may show as white patches on the skin outside of your mouth.  You are nursing your baby and you have redness and pain in the nipples. This information is not intended to replace advice given to you by your health care provider. Make sure you discuss any questions you have with your health care provider. Document Released: 03/13/2010 Document Revised: 09/10/2016 Document Reviewed: 09/10/2016 Elsevier Interactive Patient Education  [employer].

## 2019-06-02 ENCOUNTER — Encounter: Payer: Self-pay | Admitting: Physician Assistant

## 2019-06-02 DIAGNOSIS — I776 Arteritis, unspecified: Secondary | ICD-10-CM

## 2019-06-03 ENCOUNTER — Other Ambulatory Visit: Payer: Self-pay | Admitting: Physician Assistant

## 2019-06-03 DIAGNOSIS — E039 Hypothyroidism, unspecified: Secondary | ICD-10-CM

## 2019-06-03 DIAGNOSIS — M5431 Sciatica, right side: Secondary | ICD-10-CM

## 2019-06-03 MED ORDER — PREDNISONE 20 MG PO TABS: 20.0000 mg | ORAL_TABLET | Freq: Two times a day (BID) | ORAL | 0 refills | Status: DC

## 2019-06-11 ENCOUNTER — Other Ambulatory Visit: Payer: Self-pay | Admitting: Family Medicine

## 2019-06-11 DIAGNOSIS — K269 Duodenal ulcer, unspecified as acute or chronic, without hemorrhage or perforation: Secondary | ICD-10-CM

## 2019-06-26 ENCOUNTER — Other Ambulatory Visit: Payer: Self-pay | Admitting: Physician Assistant

## 2019-06-26 DIAGNOSIS — I10 Essential (primary) hypertension: Secondary | ICD-10-CM

## 2019-06-29 ENCOUNTER — Other Ambulatory Visit: Payer: Self-pay | Admitting: Physician Assistant

## 2019-06-29 DIAGNOSIS — K269 Duodenal ulcer, unspecified as acute or chronic, without hemorrhage or perforation: Secondary | ICD-10-CM

## 2019-06-29 MED ORDER — SUCRALFATE 1 G PO TABS: 1.0000 g | ORAL_TABLET | Freq: Three times a day (TID) | ORAL | 1 refills | Status: DC

## 2019-06-29 NOTE — Telephone Encounter (Signed)
Please advise refill? 

## 2019-06-29 NOTE — Addendum Note (Signed)
Addended by: Mar Daring on: 06/29/2019 10:21 AM   Modules accepted: Orders

## 2019-07-10 ENCOUNTER — Other Ambulatory Visit: Payer: Self-pay | Admitting: Neurology

## 2019-08-05 ENCOUNTER — Other Ambulatory Visit: Payer: Self-pay | Admitting: Neurology

## 2019-09-16 ENCOUNTER — Telehealth: Payer: Self-pay | Admitting: *Deleted

## 2019-09-16 NOTE — Telephone Encounter (Signed)
Called pt because she is past due for f/u. Pt states she is no longer going to follow with Dr. Felecia Shelling. She is following with MD at [facility] now. She will call back if anything furtherneeded.

## 2019-12-07 IMAGING — MG MM DIGITAL SCREENING BILAT W/ CAD
5 series · 5 of 5 positions shown · non-contrast
Comparison: Previous exam(s).

CLINICAL DATA: Screening.

EXAM:
DIGITAL SCREENING BILATERAL MAMMOGRAM WITH CAD

[R CC]
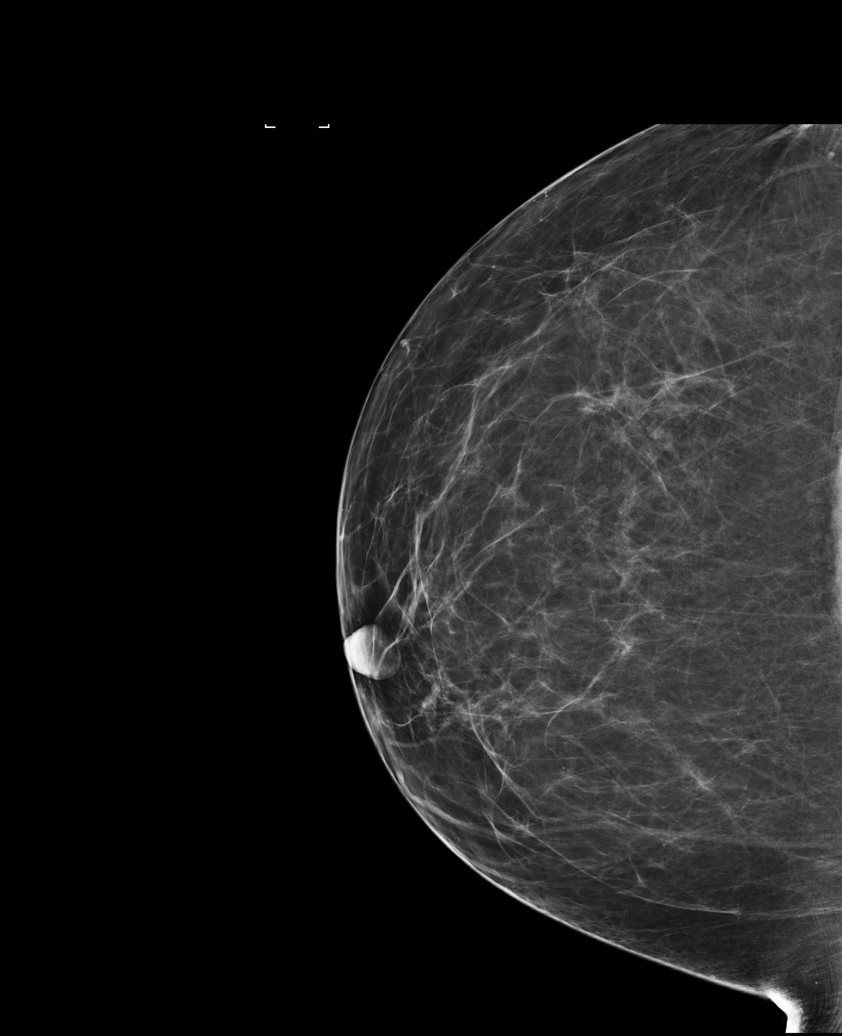

[L MLO (1 of 2)]
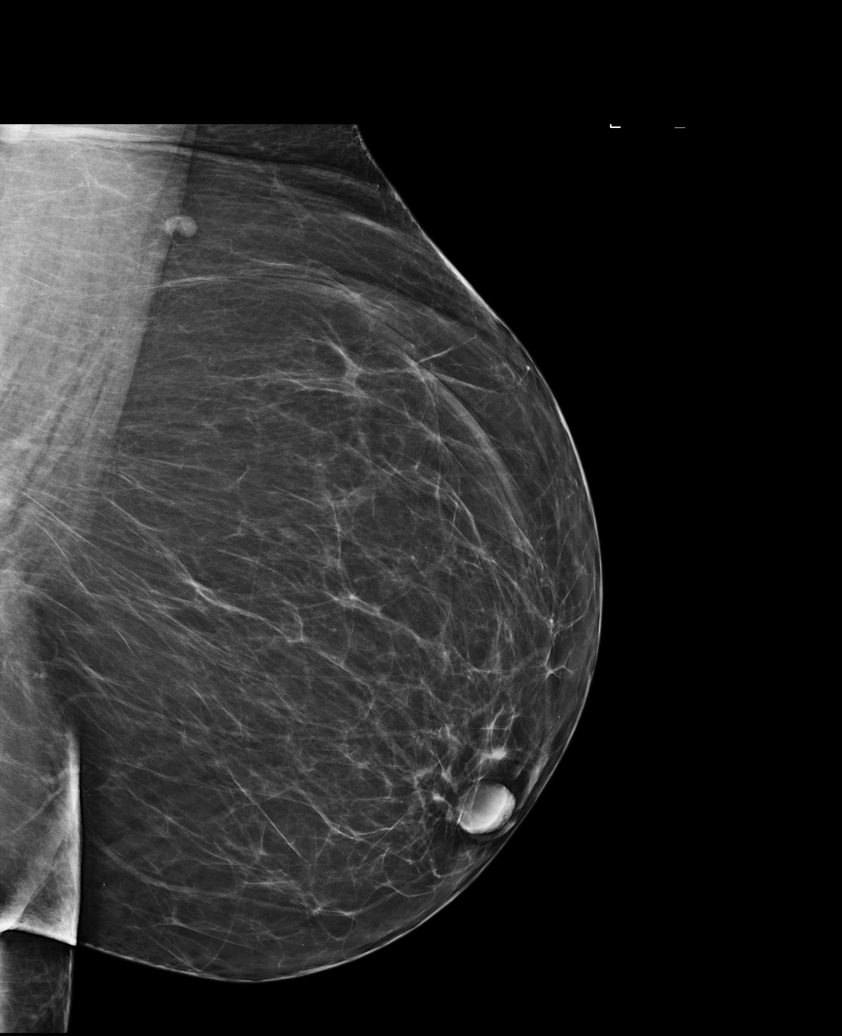

[L MLO (2 of 2)]
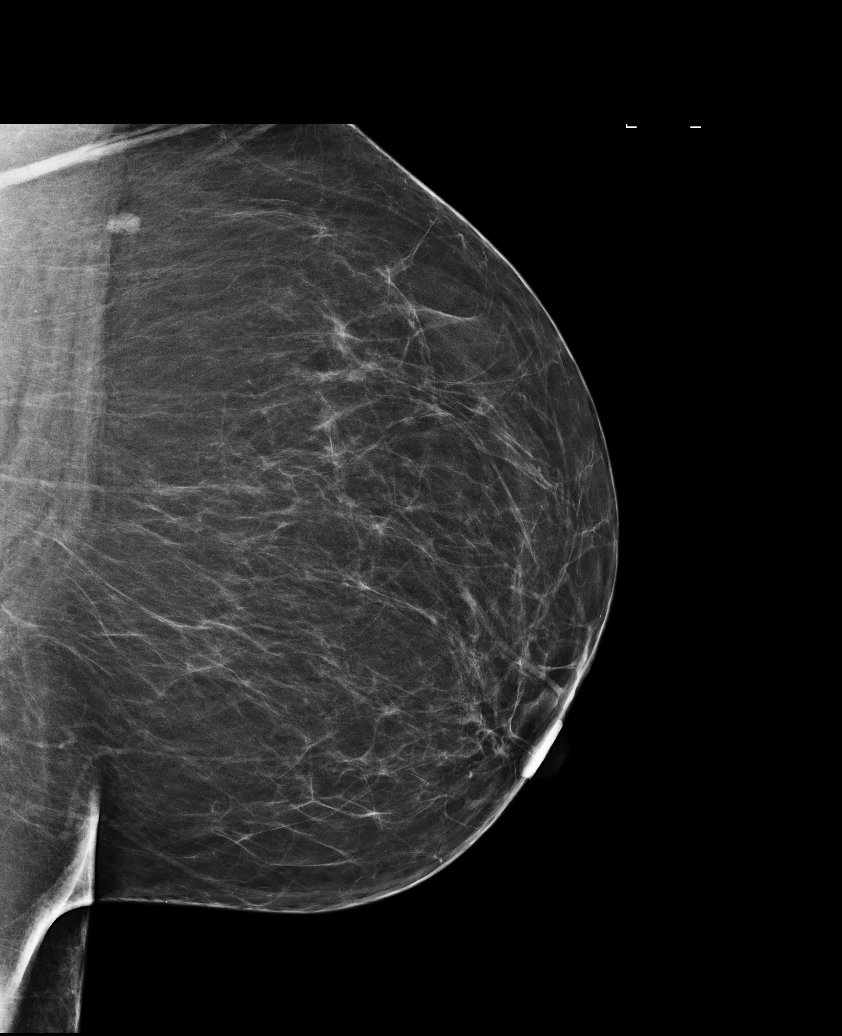

[L CC]
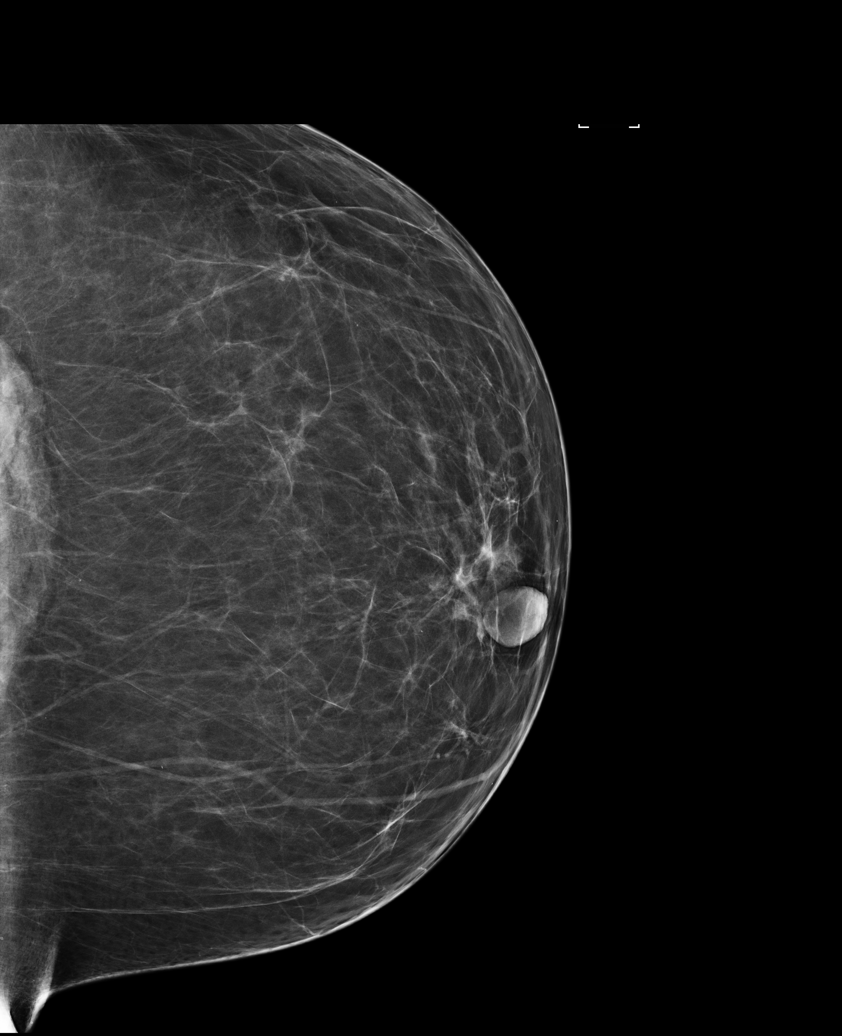

[R MLO]
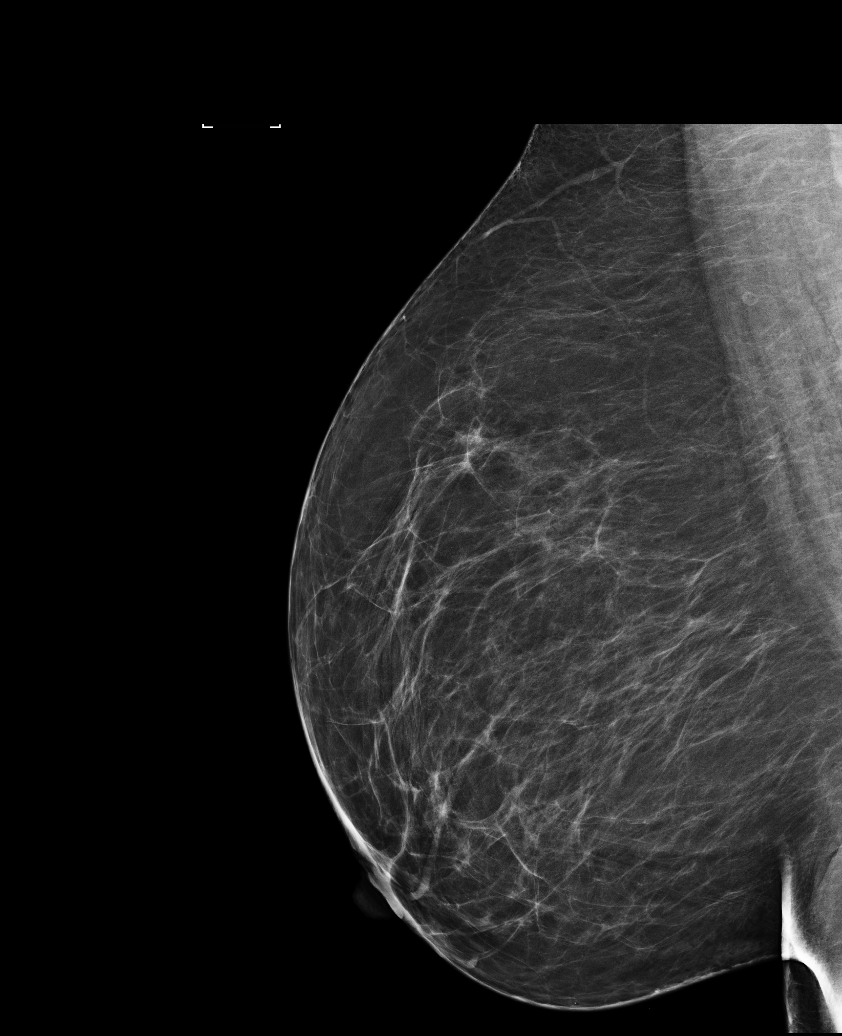

[5 of 5 positions shown; findings below may reference images not displayed]

ACR Breast Density Category b: There are scattered areas of
fibroglandular density.
FINDINGS: There are no findings suspicious for malignancy. Images were
processed with CAD.
IMPRESSION: No mammographic evidence of malignancy. A result letter of this
screening mammogram will be mailed directly to the patient.

RECOMMENDATION:
Screening mammogram in one year. (Code:AS-G-LCT)

BI-RADS CATEGORY  1: Negative.

## 2019-12-17 ENCOUNTER — Other Ambulatory Visit: Payer: Self-pay | Admitting: Physician Assistant

## 2019-12-17 DIAGNOSIS — E039 Hypothyroidism, unspecified: Secondary | ICD-10-CM

## 2019-12-17 DIAGNOSIS — M5431 Sciatica, right side: Secondary | ICD-10-CM

## 2019-12-27 ENCOUNTER — Encounter: Payer: Self-pay | Admitting: Physician Assistant

## 2019-12-27 DIAGNOSIS — B370 Candidal stomatitis: Secondary | ICD-10-CM

## 2019-12-28 MED ORDER — NYSTATIN 100000 UNIT/ML MT SUSP: 5.0000 mL | Freq: Four times a day (QID) | OROMUCOSAL | 0 refills | Status: DC

## 2020-01-04 ENCOUNTER — Other Ambulatory Visit: Payer: Self-pay | Admitting: Physician Assistant

## 2020-01-04 DIAGNOSIS — I10 Essential (primary) hypertension: Secondary | ICD-10-CM

## 2020-01-04 NOTE — Telephone Encounter (Signed)
Requested medication (s) are due for refill today: yes  Requested medication (s) are on the active medication list: yes  Last refill:  10/01/2019  Future visit scheduled: no  Notes to clinic:     No valid encounter within last 6 months     Requested Prescriptions  Pending Prescriptions Disp Refills   amLODipine (NORVASC) 5 MG tablet [Pharmacy Med Name: AMLODIPINE BESYLATE 5 MG TAB] 90 tablet 1    Sig: TAKE 1 TABLET BY MOUTH EVERY DAY      Cardiovascular:  Calcium Channel Blockers Failed - 01/04/2020  1:14 AM      Failed - Valid encounter within last 6 months    Recent Outpatient Visits           7 months ago Duodenal ulcer disease   [facility], [place]   9 months ago Duodenal ulcer disease   [facility], [place]   12 months ago Nausea and vomiting, intractability of vomiting not specified, unspecified vomiting type   [facility], Wendee Beavers, PA-C   1 year ago Upper respiratory tract infection, unspecified type   [facility] [place], [place]   1 year ago MS (multiple sclerosis) [facility])   [facility], [place]              Passed - Last BP in normal range    BP Readings from Last 1 Encounters:  05/28/19 133/80

## 2020-01-15 ENCOUNTER — Encounter: Payer: Self-pay | Admitting: Physician Assistant

## 2020-01-15 ENCOUNTER — Ambulatory Visit (INDEPENDENT_AMBULATORY_CARE_PROVIDER_SITE_OTHER): Payer: BC Managed Care – PPO | Admitting: Physician Assistant

## 2020-01-15 ENCOUNTER — Other Ambulatory Visit: Payer: Self-pay

## 2020-01-15 VITALS — BP 135/83 | HR 84 | Temp 96.6°F | Wt 313.0 lb

## 2020-01-15 DIAGNOSIS — R109 Unspecified abdominal pain: Secondary | ICD-10-CM | POA: Diagnosis not present

## 2020-01-15 DIAGNOSIS — D72810 Lymphocytopenia: Secondary | ICD-10-CM

## 2020-01-15 DIAGNOSIS — M62830 Muscle spasm of back: Secondary | ICD-10-CM

## 2020-01-15 DIAGNOSIS — R42 Dizziness and giddiness: Secondary | ICD-10-CM | POA: Diagnosis not present

## 2020-01-15 DIAGNOSIS — B353 Tinea pedis: Secondary | ICD-10-CM | POA: Diagnosis not present

## 2020-01-15 LAB — POCT GLYCOSYLATED HEMOGLOBIN (HGB A1C): Hemoglobin A1C: 5.7 % — AB (ref 4.0–5.6)

## 2020-01-15 LAB — POCT URINALYSIS DIPSTICK
Bilirubin, UA: NEGATIVE
Glucose, UA: NEGATIVE
Leukocytes, UA: NEGATIVE
Nitrite, UA: NEGATIVE
Protein, UA: POSITIVE — AB
Spec Grav, UA: 1.02 (ref 1.010–1.025)
Urobilinogen, UA: 0.2 E.U./dL
pH, UA: 5 (ref 5.0–8.0)

## 2020-01-15 LAB — GLUCOSE, POCT (MANUAL RESULT ENTRY): POC Glucose: 92 mg/dl (ref 70–99)

## 2020-01-15 MED ORDER — CYCLOBENZAPRINE HCL 10 MG PO TABS: 10.0000 mg | ORAL_TABLET | Freq: Three times a day (TID) | ORAL | 0 refills | Status: DC | PRN

## 2020-01-15 MED ORDER — KETOCONAZOLE 2 % EX CREA: 1.0000 "application " | TOPICAL_CREAM | Freq: Every day | CUTANEOUS | 0 refills | Status: DC

## 2020-01-15 MED ORDER — MELOXICAM 15 MG PO TABS: 15.0000 mg | ORAL_TABLET | Freq: Every day | ORAL | 0 refills | Status: DC

## 2020-01-15 NOTE — Patient Instructions (Signed)
Muscle Cramps and Spasms Muscle cramps and spasms are when muscles tighten by themselves. They usually get better within minutes. Muscle cramps are painful. They are usually stronger and last longer than muscle spasms. Muscle spasms may or may not be painful. They can last a few seconds or much longer. Cramps and spasms can affect any muscle, but they occur most often in the calf muscles of the leg. They are usually not caused by a serious problem. In many cases, the cause is not known. Some common causes include:  Doing more physical work or exercise than your body is ready for.  Using the muscles too much (overuse) by repeating certain movements too many times.  Staying in a certain position for a long time.  Playing a sport or doing an activity without preparing properly.  Using bad form or technique while playing a sport or doing an activity.  Not having enough water in your body (dehydration).  Injury.  Side effects of some medicines.  Low levels of the salts and minerals in your blood (electrolytes), such as low potassium or calcium. Follow these instructions at home: Managing pain and stiffness      Massage, stretch, and relax the muscle. Do this for many minutes at a time.  If told, put heat on tight or tense muscles as often as told by your doctor. Use the heat source that your doctor recommends, such as a moist heat pack or a heating pad. ? Place a towel between your skin and the heat source. ? Leave the heat on for 20-30 minutes. ? Remove the heat if your skin turns bright red. This is very important if you are not able to feel pain, heat, or cold. You may have a greater risk of getting burned.  If told, put ice on the affected area. This may help if you are sore or have pain after a cramp or spasm. ? Put ice in a plastic bag. ? Place a towel between your skin and the bag. ? Leave the ice on for 20 minutes, 2-3 times a day.  Try taking hot showers or baths to help  relax tight muscles. Eating and drinking  Drink enough fluid to keep your pee (urine) pale yellow.  Eat a healthy diet to help ensure that your muscles work well. This should include: ? Fruits and vegetables. ? Lean protein. ? Whole grains. ? Low-fat or nonfat dairy products. General instructions  If you are having cramps often, avoid intense exercise for several days.  Take over-the-counter and prescription medicines only as told by your doctor.  Watch for any changes in your symptoms.  Keep all follow-up visits as told by your doctor. This is important. Contact a doctor if:  Your cramps or spasms get worse or happen more often.  Your cramps or spasms do not get better with time. Summary  Muscle cramps and spasms are when muscles tighten by themselves. They usually get better within minutes.  Cramps and spasms occur most often in the calf muscles of the leg.  Massage, stretch, and relax the muscle. This may help the cramp or spasm go away.  Drink enough fluid to keep your pee (urine) pale yellow. This information is not intended to replace advice given to you by your health care provider. Make sure you discuss any questions you have with your health care provider. Document Revised: 05/12/2018 Document Reviewed: 05/12/2018 Elsevier Patient Education  2020 [employer] Inc[employer]  

## 2020-01-15 NOTE — Progress Notes (Signed)
Patient: Patricia Deleon Female    DOB: [birth year]   59 y.o.   MRN: [id number] Visit Date: 01/15/2020  Today's Provider: Mar Daring, PA-C   Chief Complaint  Patient presents with  . Nail Problem  . Flank Pain   Subjective:     Flank Pain This is a new problem. The problem has been gradually worsening since onset. The pain is present in the lumbar spine (Right side). Pertinent negatives include no abdominal pain, bladder incontinence, bowel incontinence, dysuria, fever, leg pain, numbness, perianal numbness, tingling or weakness. She has tried ice for the symptoms. The treatment provided mild relief.   She also reports having some mild dizziness this morning, feeling like she was walking off balance.   Also noticed having more PVCs this morning during the episode of being off balance.  Also having discoloration of the 3rd toe nail on the left foot as well as a small area of redness and itching between the 3rd and 4th toes on the left foot.   Allergies  Allergen Reactions  . Shellfish Allergy Hives     Current Outpatient Medications:  .  amLODipine (NORVASC) 5 MG tablet, TAKE 1 TABLET BY MOUTH EVERY DAY, Disp: 90 tablet, Rfl: 1 .  Armodafinil (NUVIGIL) 200 MG TABS, One po qAM (Patient taking differently: Take 200 mg by mouth daily as needed. One po qAM), Disp: 30 tablet, Rfl: 5 .  aspirin 81 MG chewable tablet, Chew 81 mg by mouth daily., Disp: , Rfl:  .  Cholecalciferol (VITAMIN D3) 2000 UNITS capsule, Take 4,000 Units by mouth daily. , Disp: , Rfl:  .  DEXILANT 60 MG capsule, TAKE ONE CAPSULE BY MOUTH ONCE DAILY, Disp: 90 capsule, Rfl: 3 .  ferrous sulfate 325 (65 FE) MG tablet, Take 325 mg by mouth every evening. , Disp: , Rfl:  .  gabapentin (NEURONTIN) 100 MG capsule, TAKE 1 CAPSULE BY MOUTH TWICE A DAY, Disp: 180 capsule, Rfl: 1 .  Glucosamine Sulfate-MSM (MSM/GLUCOSAMINE) 250-250 MG CAPS, Take 1 tablet by mouth 2 (two) times daily. , Disp: , Rfl:  .   levothyroxine (SYNTHROID) 150 MCG tablet, TAKE 1 TABLET BY MOUTH ONCE DAILY BEFORE BREAKFAST, Disp: 90 tablet, Rfl: 1 .  nystatin (MYCOSTATIN) 100000 UNIT/ML suspension, Take 5 mLs (500,000 Units total) by mouth 4 (four) times daily., Disp: 240 mL, Rfl: 0 .  Omega-3 Fatty Acids (FISH OIL) 500 MG CAPS, Take by mouth., Disp: , Rfl:  .  polyethylene glycol powder (MIRALAX) powder, Take 17 g by mouth at bedtime. , Disp: , Rfl:  .  promethazine (PHENERGAN) 12.5 MG tablet, Take 1 tablet (12.5 mg total) by mouth every 8 (eight) hours as needed for nausea or vomiting., Disp: 20 tablet, Rfl: 0 .  sucralfate (CARAFATE) 1 g tablet, Take 1 tablet (1 g total) by mouth 4 (four) times daily -  with meals and at bedtime., Disp: 360 tablet, Rfl: 1 .  TECFIDERA 240 MG CPDR, Take 1 capsule (240 mg total) by mouth 2 (two) times daily. Call [phone] to make appt for future refills, Disp: 60 capsule, Rfl: 0 .  traZODone (DESYREL) 50 MG tablet, , Disp: , Rfl:  .  venlafaxine XR (EFFEXOR-XR) 37.5 MG 24 hr capsule, , Disp: , Rfl:  .  vitamin C (ASCORBIC ACID) 500 MG tablet, Take 500 mg by mouth daily., Disp: , Rfl:  .  cyclobenzaprine (FLEXERIL) 10 MG tablet, Take 1 tablet (10 mg total) by mouth  3 (three) times daily as needed for muscle spasms., Disp: 30 tablet, Rfl: 0 .  ketoconazole (NIZORAL) 2 % cream, Apply 1 application topically daily., Disp: 15 g, Rfl: 0 .  meloxicam (MOBIC) 15 MG tablet, Take 1 tablet (15 mg total) by mouth daily., Disp: 30 tablet, Rfl: 0 .  predniSONE (DELTASONE) 20 MG tablet, Take 1 tablet (20 mg total) by mouth 2 (two) times daily with a meal. (Patient not taking: Reported on 01/15/2020), Disp: 14 tablet, Rfl: 0  Current Facility-Administered Medications:  .  promethazine (PHENERGAN) injection 12.5-25 mg, 12.5-25 mg, Intramuscular, Q6H PRN, Pollak, Adriana M, PA-C, 25 mg at 01/06/19 1258  Review of Systems  Constitutional: Positive for appetite change (No appetite ). Negative for activity  change, chills, diaphoresis, fatigue, fever and unexpected weight change.  Respiratory: Negative.   Cardiovascular: Negative.   Gastrointestinal: Positive for constipation and nausea. Negative for abdominal distention, abdominal pain, anal bleeding, blood in stool, bowel incontinence, diarrhea, rectal pain and vomiting.  Genitourinary: Positive for flank pain. Negative for bladder incontinence, decreased urine volume, difficulty urinating, dyspareunia, dysuria, frequency, hematuria, urgency, vaginal bleeding, vaginal discharge and vaginal pain.  Musculoskeletal: Positive for gait problem. Negative for arthralgias, back pain, joint swelling, myalgias, neck pain and neck stiffness.  Neurological: Positive for dizziness. Negative for tingling, weakness and numbness.    Social History   Tobacco Use  . Smoking status: Never Smoker  . Smokeless tobacco: Never Used  Substance Use Topics  . Alcohol use: No    Alcohol/week: 0.0 standard drinks      Objective:   BP 135/83 (BP Location: Left Arm, Patient Position: Sitting, Cuff Size: Large)   Pulse 84   Temp (!) 96.6 F (35.9 C) (Temporal)   Wt (!) 313 lb (142 kg)   BMI 50.52 kg/m  Vitals:   01/15/20 1757  BP: 135/83  Pulse: 84  Temp: (!) 96.6 F (35.9 C)  TempSrc: Temporal  Weight: (!) 313 lb (142 kg)  Body mass index is 50.52 kg/m.   Physical Exam Vitals reviewed.  Constitutional:      General: She is not in acute distress.    Appearance: Normal appearance. She is well-developed. She is obese. She is not ill-appearing or diaphoretic.  HENT:     Head: Normocephalic and atraumatic.     Right Ear: Tympanic membrane, ear canal and external ear normal. No middle ear effusion. There is no impacted cerumen.     Left Ear: Tympanic membrane, ear canal and external ear normal.  No middle ear effusion. There is no impacted cerumen.  Neck:     Thyroid: No thyromegaly.     Vascular: No JVD.     Trachea: No tracheal deviation.    Cardiovascular:     Rate and Rhythm: Normal rate and regular rhythm.     Pulses: Normal pulses.          Dorsalis pedis pulses are 2+ on the left side.       Posterior tibial pulses are 2+ on the left side.     Heart sounds: Normal heart sounds. No murmur. No friction rub. No gallop.   Pulmonary:     Effort: Pulmonary effort is normal. No respiratory distress.     Breath sounds: Normal breath sounds. No wheezing or rales.  Musculoskeletal:     Cervical back: Normal range of motion and neck supple.     Left foot: Normal range of motion.  Feet:     Left foot:  Skin integrity: Skin integrity normal.     Comments: The third toe nail on the left foot has a white discoloration to about the middle of the nail on the distal end, the proximal edge at the nail bed is clear and normal. Suspect past nail trauma, maybe nail hitting end of toe box of shoe. Lymphadenopathy:     Cervical: No cervical adenopathy.  Neurological:     Mental Status: She is alert.      Results for orders placed or performed in visit on 01/15/20  POCT Urinalysis Dipstick  Result Value Ref Range   Color, UA     Clarity, UA     Glucose, UA Negative Negative   Bilirubin, UA Negative    Ketones, UA Small    Spec Grav, UA 1.020 1.010 - 1.025   Blood, UA Trace    pH, UA 5.0 5.0 - 8.0   Protein, UA Positive (A) Negative   Urobilinogen, UA 0.2 0.2 or 1.0 E.U./dL   Nitrite, UA Negative    Leukocytes, UA Negative Negative   Appearance     Odor    POCT HgB A1C  Result Value Ref Range   Hemoglobin A1C 5.7 (A) 4.0 - 5.6 %   HbA1c POC (<> result, manual entry)     HbA1c, POC (prediabetic range)     HbA1c, POC (controlled diabetic range)    POCT Glucose (CBG)  Result Value Ref Range   POC Glucose 92 70 - 99 mg/dl       Assessment & Plan    1. Flank pain UA essentially unremarkable. Will send urine for culture and will f/u pending results. Suspect muscle spasm as their was spasm noted in the paraspinal muscle  group, lumbar region and the quadratus lumborum area both on the right side. Will treat as below with meloxicam and flexeril.   2. Dizziness Checked random glucose and A1c both normal. Dizziness has improved through the day, was worse this morning. None currently. Will monitor.   3. Tinea pedis of left foot Noted between 3rd and 4th toes on the left foot.  - ketoconazole (NIZORAL) 2 % cream; Apply 1 application topically daily.  Dispense: 15 g; Refill: 0  4. Back spasm See above medical treatment plan #1.  - cyclobenzaprine (FLEXERIL) 10 MG tablet; Take 1 tablet (10 mg total) by mouth 3 (three) times daily as needed for muscle spasms.  Dispense: 30 tablet; Refill: 0 - meloxicam (MOBIC) 15 MG tablet; Take 1 tablet (15 mg total) by mouth daily.  Dispense: 30 tablet; Refill: 0  5. Lymphopenia Has MS and noted on her most recent lab work. Suspected to be from her medication, Tecfidera. She is due for recheck of her labs in March.  - Urine Culture     Mar Daring, PA-C  [facility]

## 2020-01-18 DIAGNOSIS — D72810 Lymphocytopenia: Secondary | ICD-10-CM | POA: Diagnosis not present

## 2020-01-20 ENCOUNTER — Telehealth: Payer: Self-pay

## 2020-01-20 LAB — URINE CULTURE

## 2020-01-20 NOTE — Telephone Encounter (Signed)
LMTCB PEC may give results 

## 2020-01-20 NOTE — Telephone Encounter (Signed)
Comment seen by patient Patricia Deleon on 01/20/2020 12:18 PM EST

## 2020-01-20 NOTE — Telephone Encounter (Signed)
-----   Message from Mar Daring, [place] sent at 01/20/2020  8:56 AM EST ----- Urine culture is negative for bacterial growth.

## 2020-01-21 ENCOUNTER — Encounter: Payer: Self-pay | Admitting: Physician Assistant

## 2020-01-27 ENCOUNTER — Encounter: Payer: Self-pay | Admitting: Physician Assistant

## 2020-01-27 DIAGNOSIS — G8929 Other chronic pain: Secondary | ICD-10-CM

## 2020-01-27 MED ORDER — ETODOLAC 500 MG PO TABS: 500.0000 mg | ORAL_TABLET | Freq: Two times a day (BID) | ORAL | 1 refills | Status: DC

## 2020-02-04 DIAGNOSIS — G4719 Other hypersomnia: Secondary | ICD-10-CM | POA: Diagnosis not present

## 2020-02-04 DIAGNOSIS — F5104 Psychophysiologic insomnia: Secondary | ICD-10-CM | POA: Diagnosis not present

## 2020-02-04 DIAGNOSIS — I776 Arteritis, unspecified: Secondary | ICD-10-CM | POA: Diagnosis not present

## 2020-02-04 DIAGNOSIS — F331 Major depressive disorder, recurrent, moderate: Secondary | ICD-10-CM | POA: Diagnosis not present

## 2020-02-22 ENCOUNTER — Encounter: Payer: Self-pay | Admitting: Physician Assistant

## 2020-02-22 ENCOUNTER — Other Ambulatory Visit: Payer: Self-pay

## 2020-02-22 ENCOUNTER — Ambulatory Visit (INDEPENDENT_AMBULATORY_CARE_PROVIDER_SITE_OTHER): Payer: BC Managed Care – PPO | Admitting: Physician Assistant

## 2020-02-22 VITALS — BP 146/95 | HR 92 | Temp 96.6°F | Wt 325.0 lb

## 2020-02-22 DIAGNOSIS — S8012XA Contusion of left lower leg, initial encounter: Secondary | ICD-10-CM

## 2020-02-22 DIAGNOSIS — W19XXXA Unspecified fall, initial encounter: Secondary | ICD-10-CM

## 2020-02-22 DIAGNOSIS — S8002XA Contusion of left knee, initial encounter: Secondary | ICD-10-CM

## 2020-02-22 MED ORDER — METHYLPREDNISOLONE 4 MG PO TBPK: ORAL_TABLET | ORAL | 0 refills | Status: DC

## 2020-02-22 NOTE — Progress Notes (Signed)
Patient: Patricia Deleon Female    DOB: 09-14-1961   59 y.o.   MRN: [id number] Visit Date: 02/22/2020  Today's Provider: Mar Daring, PA-C   Chief Complaint  Patient presents with  . Knee Pain  . Hip Pain   Subjective:     Knee Pain  The incident occurred 2 days ago. The incident occurred at home. The injury mechanism was a fall. The pain is present in the left knee. Associated symptoms include numbness and tingling. Pertinent negatives include no inability to bear weight, loss of motion or loss of sensation. The symptoms are aggravated by palpation and movement.  Hip Pain  The incident occurred 2 days ago. The injury mechanism was a fall. The pain is present in the right hip. Associated symptoms include numbness and tingling. Pertinent negatives include no inability to bear weight, loss of motion or loss of sensation.     Allergies  Allergen Reactions  . Shellfish Allergy Hives     Current Outpatient Medications:  .  amLODipine (NORVASC) 5 MG tablet, TAKE 1 TABLET BY MOUTH EVERY DAY, Disp: 90 tablet, Rfl: 1 .  Armodafinil (NUVIGIL) 200 MG TABS, One po qAM (Patient taking differently: Take 200 mg by mouth daily as needed. One po qAM), Disp: 30 tablet, Rfl: 5 .  aspirin 81 MG chewable tablet, Chew 81 mg by mouth daily., Disp: , Rfl:  .  Cholecalciferol (VITAMIN D3) 2000 UNITS capsule, Take 4,000 Units by mouth daily. , Disp: , Rfl:  .  cyclobenzaprine (FLEXERIL) 10 MG tablet, Take 1 tablet (10 mg total) by mouth 3 (three) times daily as needed for muscle spasms., Disp: 30 tablet, Rfl: 0 .  DEXILANT 60 MG capsule, TAKE ONE CAPSULE BY MOUTH ONCE DAILY, Disp: 90 capsule, Rfl: 3 .  etodolac (LODINE) 500 MG tablet, Take 1 tablet (500 mg total) by mouth 2 (two) times daily., Disp: 60 tablet, Rfl: 1 .  ferrous sulfate 325 (65 FE) MG tablet, Take 325 mg by mouth every evening. , Disp: , Rfl:  .  gabapentin (NEURONTIN) 100 MG capsule, TAKE 1 CAPSULE BY MOUTH TWICE A DAY,  Disp: 180 capsule, Rfl: 1 .  Glucosamine Sulfate-MSM (MSM/GLUCOSAMINE) 250-250 MG CAPS, Take 1 tablet by mouth 2 (two) times daily. , Disp: , Rfl:  .  ketoconazole (NIZORAL) 2 % cream, Apply 1 application topically daily., Disp: 15 g, Rfl: 0 .  levothyroxine (SYNTHROID) 150 MCG tablet, TAKE 1 TABLET BY MOUTH ONCE DAILY BEFORE BREAKFAST, Disp: 90 tablet, Rfl: 1 .  nystatin (MYCOSTATIN) 100000 UNIT/ML suspension, Take 5 mLs (500,000 Units total) by mouth 4 (four) times daily., Disp: 240 mL, Rfl: 0 .  Omega-3 Fatty Acids (FISH OIL) 500 MG CAPS, Take by mouth., Disp: , Rfl:  .  polyethylene glycol powder (MIRALAX) powder, Take 17 g by mouth at bedtime. , Disp: , Rfl:  .  promethazine (PHENERGAN) 12.5 MG tablet, Take 1 tablet (12.5 mg total) by mouth every 8 (eight) hours as needed for nausea or vomiting., Disp: 20 tablet, Rfl: 0 .  sucralfate (CARAFATE) 1 g tablet, Take 1 tablet (1 g total) by mouth 4 (four) times daily -  with meals and at bedtime., Disp: 360 tablet, Rfl: 1 .  TECFIDERA 240 MG CPDR, Take 1 capsule (240 mg total) by mouth 2 (two) times daily. Call [phone] to make appt for future refills, Disp: 60 capsule, Rfl: 0 .  traZODone (DESYREL) 50 MG tablet, , Disp: , Rfl:  .  venlafaxine XR (EFFEXOR-XR) 37.5 MG 24 hr capsule, , Disp: , Rfl:  .  vitamin C (ASCORBIC ACID) 500 MG tablet, Take 500 mg by mouth daily., Disp: , Rfl:  .  predniSONE (DELTASONE) 20 MG tablet, Take 1 tablet (20 mg total) by mouth 2 (two) times daily with a meal. (Patient not taking: Reported on 01/15/2020), Disp: 14 tablet, Rfl: 0  Current Facility-Administered Medications:  .  promethazine (PHENERGAN) injection 12.5-25 mg, 12.5-25 mg, Intramuscular, Q6H PRN, Pollak, Adriana M, PA-C, 25 mg at 01/06/19 1258  Review of Systems  Constitutional: Negative.   Respiratory: Negative.   Cardiovascular: Negative.   Musculoskeletal: Positive for arthralgias (Left knee pain and Right hip pain. ) and joint swelling. Negative  for back pain, gait problem, myalgias, neck pain and neck stiffness.  Neurological: Positive for tingling and numbness. Negative for dizziness, light-headedness and headaches.    Social History   Tobacco Use  . Smoking status: Never Smoker  . Smokeless tobacco: Never Used  Substance Use Topics  . Alcohol use: No    Alcohol/week: 0.0 standard drinks      Objective:   BP (!) 146/95 (BP Location: Left Wrist, Patient Position: Sitting, Cuff Size: Normal)   Pulse 92   Temp (!) 96.6 F (35.9 C) (Temporal)   Wt (!) 325 lb (147.4 kg)   BMI 52.46 kg/m  Vitals:   02/22/20 1726  BP: (!) 146/95  Pulse: 92  Temp: (!) 96.6 F (35.9 C)  TempSrc: Temporal  Weight: (!) 325 lb (147.4 kg)  Body mass index is 52.46 kg/m.   Physical Exam Vitals reviewed.  Constitutional:      General: She is not in acute distress.    Appearance: Normal appearance. She is well-developed. She is obese. She is not ill-appearing or diaphoretic.  Neck:     Thyroid: No thyromegaly.     Vascular: No JVD.     Trachea: No tracheal deviation.  Cardiovascular:     Rate and Rhythm: Normal rate and regular rhythm.     Pulses: Normal pulses.     Heart sounds: Normal heart sounds. No murmur. No friction rub. No gallop.   Pulmonary:     Effort: Pulmonary effort is normal. No respiratory distress.     Breath sounds: Normal breath sounds. No wheezing or rales.  Musculoskeletal:     Cervical back: Normal range of motion and neck supple.     Left knee: Swelling, ecchymosis and bony tenderness (patella and tibial plateau) present. No effusion, erythema or crepitus. Decreased range of motion. Tenderness present over the medial joint line. No LCL laxity, MCL laxity, ACL laxity or PCL laxity.Normal alignment, normal meniscus and normal patellar mobility. Normal pulse.  Lymphadenopathy:     Cervical: No cervical adenopathy.  Neurological:     Mental Status: She is alert.      No results found for any visits on  02/22/20.     Assessment & Plan    1. Fall, initial encounter Will get xray as below due to swelling and bruising noted. Dicsussed normal course of ecchymosis and that gravity will make to move more toward the lower leg, ankle and foot. Will give medrol dose pak as below due to acute inflammation and pain. Call if worsening. - DG Knee Complete 4 Views Left; Future - methylPREDNISolone (MEDROL) 4 MG TBPK tablet; 6 day taper; take as directed on package instructions  Dispense: 21 tablet; Refill: 0  2. Contusion of left knee and lower leg, initial encounter  See above medical treatment plan. - DG Knee Complete 4 Views Left; Future - methylPREDNISolone (MEDROL) 4 MG TBPK tablet; 6 day taper; take as directed on package instructions  Dispense: 21 tablet; Refill: 0     Mar Daring, PA-C  [facility]

## 2020-02-22 NOTE — Patient Instructions (Signed)
Knee Effusion Knee effusion refers to excess fluid in the knee joint. This can cause pain and swelling in your knee. Knee effusion creates more pressure than usual in your knee joint. This makes it more difficult for you to bend and move your knee. If there is fluid in your knee, it often means that something is wrong inside your knee. This can be a result of:  Severe arthritis.  Injury to the knee muscles, ligaments, or cartilage.  Infection.  Autoimmune disease. This means that your body's defense system (immune system) mistakenly attacks healthy body tissues. Follow these instructions at home: Medicines  Take over-the-counter and prescription medicines only as told by your health care provider.  Do not drive or use heavy machinery while taking prescription pain medicine.  If you are taking prescription pain medicine, take actions to prevent or treat constipation. Your health care provider may recommend that you: ? Drink enough fluid to keep your urine pale yellow. ? Eat foods that are high in fiber, such as fresh fruits and vegetables, whole grains, and beans. ? Limit foods that are high in fat and processed sugars, such as fried or sweet foods. ? Take an over-the-counter or prescription medicine for constipation. If you have a brace:  Wear the brace as told by your health care provider. Remove it only as told by your health care provider.  Loosen the brace if your toes tingle, become numb, or turn cold and blue.  Keep the brace clean.  If the brace is not waterproof: ? Do not let it get wet. ? Cover it with a watertight covering when you take a bath or a shower. Managing pain, stiffness, and swelling   If directed, put ice on the swollen area: ? If you have a removable brace, remove it as told by your health care provider. ? Put ice in a plastic bag. ? Place a towel between your skin and the bag. ? Leave the ice on for 20 minutes, 2-3 times per day.  Raise (elevate) your  knee at or above the level of your heart while you are sitting or lying down. General instructions  Do not use any products that contain nicotine or tobacco, such as cigarettes and e-cigarettes. These can delay healing. If you need help quitting, ask your health care provider.  Do not use the injured limb to support your body weight until your health care provider says it is okay. Use crutches as told by your health care provider.  Do any rehabilitation or strengthening exercises as told by your health care provider.  Rest as told by your health care provider. ? Avoid sitting for a long time without moving. Get up to take short walks every 1-2 hours. This is important to improve blood flow and breathing. Ask for help if you feel weak or unsteady.  Keep all follow-up visits as told by your health care provider. This is important. Contact a health care provider if you:  Continue to have pain in your knee. Get help right away if you:  Have swelling or redness of your knee that gets worse or does not get better.  Have severe pain in your knee.  Have a fever. Summary  Knee effusion refers to excess fluid in the knee joint. This causes pain and swelling and makes it difficult to bend and move your knee.  Effusion may be caused by severe arthritis, autoimmune disease, infection, or injury to the knee muscles, ligaments, or cartilage.  Take over-the-counter and  prescription medicines only as told by your health care provider.  If you have a brace, wear the brace as told by your health care provider. This information is not intended to replace advice given to you by your health care provider. Make sure you discuss any questions you have with your health care provider. Document Revised: 09/05/2018 Document Reviewed: 12/29/2017 Elsevier Patient Education  2020 [employer]  

## 2020-02-23 ENCOUNTER — Ambulatory Visit
Admission: RE | Admit: 2020-02-23 | Discharge: 2020-02-23 | Disposition: A | Discharge status: Home / Self Care | Payer: BC Managed Care – PPO | Attending: Physician Assistant | Admitting: Physician Assistant

## 2020-02-23 ENCOUNTER — Ambulatory Visit
Admission: RE | Admit: 2020-02-23 | Discharge: 2020-02-23 | Disposition: A | Discharge status: Home / Self Care | Payer: BC Managed Care – PPO | Source: Ambulatory Visit | Attending: Physician Assistant | Admitting: Physician Assistant

## 2020-02-23 DIAGNOSIS — M25562 Pain in left knee: Secondary | ICD-10-CM | POA: Diagnosis not present

## 2020-02-23 DIAGNOSIS — S8992XA Unspecified injury of left lower leg, initial encounter: Secondary | ICD-10-CM | POA: Diagnosis not present

## 2020-02-23 DIAGNOSIS — S8002XA Contusion of left knee, initial encounter: Secondary | ICD-10-CM

## 2020-02-23 DIAGNOSIS — S8012XA Contusion of left lower leg, initial encounter: Secondary | ICD-10-CM | POA: Diagnosis not present

## 2020-02-23 DIAGNOSIS — W19XXXA Unspecified fall, initial encounter: Secondary | ICD-10-CM | POA: Diagnosis not present

## 2020-02-25 ENCOUNTER — Telehealth: Payer: Self-pay

## 2020-02-25 NOTE — Telephone Encounter (Signed)
-----   Message from Mar Daring, [place] sent at 02/24/2020  9:25 AM EST ----- No fractures to the knee. Just bone bruising. Continue conservative treatments and definitely call if not improving over the next 4-6 weeks.

## 2020-02-25 NOTE — Telephone Encounter (Signed)
[employer]   Result Notes and Comments to Patient Comment seen by patient Patricia Deleon on 02/24/2020 6:06 PM

## 2020-02-29 ENCOUNTER — Other Ambulatory Visit: Payer: Self-pay | Admitting: Physician Assistant

## 2020-02-29 DIAGNOSIS — K219 Gastro-esophageal reflux disease without esophagitis: Secondary | ICD-10-CM

## 2020-03-02 ENCOUNTER — Encounter: Payer: Self-pay | Admitting: Physician Assistant

## 2020-03-02 DIAGNOSIS — Z79899 Other long term (current) drug therapy: Secondary | ICD-10-CM

## 2020-03-02 DIAGNOSIS — F5104 Psychophysiologic insomnia: Secondary | ICD-10-CM | POA: Diagnosis not present

## 2020-03-02 DIAGNOSIS — F331 Major depressive disorder, recurrent, moderate: Secondary | ICD-10-CM | POA: Diagnosis not present

## 2020-03-02 DIAGNOSIS — G4719 Other hypersomnia: Secondary | ICD-10-CM | POA: Diagnosis not present

## 2020-03-02 DIAGNOSIS — I776 Arteritis, unspecified: Secondary | ICD-10-CM | POA: Diagnosis not present

## 2020-03-04 ENCOUNTER — Encounter: Payer: Self-pay | Admitting: Physician Assistant

## 2020-03-09 DIAGNOSIS — Z79899 Other long term (current) drug therapy: Secondary | ICD-10-CM | POA: Diagnosis not present

## 2020-03-10 ENCOUNTER — Telehealth: Payer: Self-pay

## 2020-03-10 LAB — CBC WITH DIFFERENTIAL/PLATELET
Basophils Absolute: 0 10*3/uL (ref 0.0–0.2)
Basos: 1 %
EOS (ABSOLUTE): 0.1 10*3/uL (ref 0.0–0.4)
Eos: 2 %
Hematocrit: 40.8 % (ref 34.0–46.6)
Hemoglobin: 13.5 g/dL (ref 11.1–15.9)
Immature Grans (Abs): 0 10*3/uL (ref 0.0–0.1)
Immature Granulocytes: 1 %
Lymphocytes Absolute: 0.3 10*3/uL — ABNORMAL LOW (ref 0.7–3.1)
Lymphs: 9 %
MCH: 27.4 pg (ref 26.6–33.0)
MCHC: 33.1 g/dL (ref 31.5–35.7)
MCV: 83 fL (ref 79–97)
Monocytes Absolute: 0.2 10*3/uL (ref 0.1–0.9)
Monocytes: 6 %
Neutrophils Absolute: 2.8 10*3/uL (ref 1.4–7.0)
Neutrophils: 81 %
Platelets: 222 10*3/uL (ref 150–450)
RBC: 4.92 x10E6/uL (ref 3.77–5.28)
RDW: 14.8 % (ref 11.7–15.4)
WBC: 3.3 10*3/uL — ABNORMAL LOW (ref 3.4–10.8)

## 2020-03-10 NOTE — Telephone Encounter (Signed)
CBC does show that your WBC count has decreased just slightly. Hemoglobin is holding steady and normal. Will send to Gar Gibbon, PA-C at [facility]  Written by Mar Daring, PA-C on 03/10/2020 7:24 AM EST Seen by patient Patricia Deleon on 03/10/2020 3:40 PM EST

## 2020-03-10 NOTE — Telephone Encounter (Signed)
-----   Message from Mar Daring, PA-C sent at 03/10/2020  7:24 AM EST ----- CBC does show that your WBC count has decreased just slightly. Hemoglobin is holding steady and normal. Will send to Gar Gibbon, PA-C at [facility]

## 2020-03-10 NOTE — Telephone Encounter (Signed)
LMTCB or to view results in my chart. If patient calls back ok for P[facility] to give results.  Faxed labs to [phone] Attn: Gar Gibbon

## 2020-03-16 ENCOUNTER — Telehealth: Payer: Self-pay

## 2020-03-16 NOTE — Telephone Encounter (Signed)
Copied from [place] [id number]. Topic: Medical Record Request - Provider/Facility Request >> Mar 16, 2020  9:24 AM Reyne Dumas L wrote: Patient Name/DOB/MRN #: Patricia Deleon Requestor Name/Agency: Lovena Le with [facility] Call Back #: [phone] Information Requested: most recent CBC (PA ordered to be done at her PCP office but no results were ever faxed over to [facility]).  [facility] fax number [phone]:  Attn: Lovena Le   Route to [place] for [place] clinics. For all other clinics, route to the clinic's PEC Pool.

## 2020-03-16 NOTE — Telephone Encounter (Signed)
Copied from [place] [id number]. Topic: Medical Record Request - Provider/Facility Request >> Mar 16, 2020  9:24 AM Reyne Dumas L wrote: Patient Name/DOB/MRN #: Patricia Deleon Requestor Name/Agency: Lovena Le with [facility] Call Back #: [phone] Information Requested: most recent CBC (PA ordered to be done at her PCP office but no results were ever faxed over to [facility]).  [facility] fax number [phone]:  Attn: Lovena Le   Route to [place] for [place] clinics. For all other clinics, route to the clinic's PEC Pool.

## 2020-03-17 NOTE — Telephone Encounter (Signed)
Fax sent to [facility]

## 2020-03-20 ENCOUNTER — Other Ambulatory Visit: Payer: Self-pay | Admitting: Physician Assistant

## 2020-03-20 DIAGNOSIS — G8929 Other chronic pain: Secondary | ICD-10-CM

## 2020-03-20 NOTE — Telephone Encounter (Signed)
Requested medications are due for refill today? Yes   Requested medications are on active medication list?  Yes  Last Refill:  01/27/2020    # 60 with one refill  Future visit scheduled? No  Notes to Clinic:  It is unclear if patient should be on this long term or if she should have an office visit to follow up.  Patient s/p fall in February.

## 2020-03-23 DIAGNOSIS — F331 Major depressive disorder, recurrent, moderate: Secondary | ICD-10-CM | POA: Diagnosis not present

## 2020-03-23 DIAGNOSIS — F5104 Psychophysiologic insomnia: Secondary | ICD-10-CM | POA: Diagnosis not present

## 2020-03-23 DIAGNOSIS — G4719 Other hypersomnia: Secondary | ICD-10-CM | POA: Diagnosis not present

## 2020-03-23 DIAGNOSIS — I776 Arteritis, unspecified: Secondary | ICD-10-CM | POA: Diagnosis not present

## 2020-03-28 ENCOUNTER — Ambulatory Visit (INDEPENDENT_AMBULATORY_CARE_PROVIDER_SITE_OTHER): Payer: BC Managed Care – PPO | Admitting: Physician Assistant

## 2020-03-28 ENCOUNTER — Other Ambulatory Visit: Payer: Self-pay

## 2020-03-28 ENCOUNTER — Encounter: Payer: Self-pay | Admitting: Physician Assistant

## 2020-03-28 VITALS — BP 142/90 | HR 87 | Temp 97.1°F | Resp 16 | Wt 321.0 lb

## 2020-03-28 DIAGNOSIS — S8012XD Contusion of left lower leg, subsequent encounter: Secondary | ICD-10-CM

## 2020-03-28 DIAGNOSIS — S8002XD Contusion of left knee, subsequent encounter: Secondary | ICD-10-CM

## 2020-03-28 MED ORDER — METHYLPREDNISOLONE ACETATE 40 MG/ML IJ SUSP
80.0000 mg | Freq: Once | INTRAMUSCULAR | Status: AC
  Administered 2020-03-28: 80 mg via INTRA_ARTICULAR

## 2020-03-28 NOTE — Progress Notes (Signed)
Patient: Patricia Deleon Female    DOB: Apr 26, 1961   59 y.o.   MRN: [id number] Visit Date: 03/28/2020  Today's Provider: Mar Daring, PA-C   Chief Complaint  Patient presents with  . Knee Pain   Subjective:     HPI  Patient here for knee pain, left side. Patient was seen a month ago for left knee contusion and was given Medrol taper. Reports that her knee still swollen and reports that she still able to feel some fluid.   Allergies  Allergen Reactions  . Shellfish Allergy Hives     Current Outpatient Medications:  .  amLODipine (NORVASC) 5 MG tablet, TAKE 1 TABLET BY MOUTH EVERY DAY, Disp: 90 tablet, Rfl: 1 .  Armodafinil (NUVIGIL) 200 MG TABS, One po qAM (Patient taking differently: Take 200 mg by mouth daily as needed. One po qAM), Disp: 30 tablet, Rfl: 5 .  aspirin 81 MG chewable tablet, Chew 81 mg by mouth daily., Disp: , Rfl:  .  Cholecalciferol (VITAMIN D3) 2000 UNITS capsule, Take 4,000 Units by mouth daily. , Disp: , Rfl:  .  DEXILANT 60 MG capsule, TAKE 1 CAPSULE BY MOUTH EVERY DAY, Disp: 90 capsule, Rfl: 0 .  etodolac (LODINE) 500 MG tablet, TAKE 1 TABLET BY MOUTH TWICE A DAY, Disp: 180 tablet, Rfl: 1 .  ferrous sulfate 325 (65 FE) MG tablet, Take 325 mg by mouth every evening. , Disp: , Rfl:  .  Ferrous Sulfate Dried (HM SLOW RELEASE IRON) 45 MG TBCR, Take by mouth., Disp: , Rfl:  .  gabapentin (NEURONTIN) 100 MG capsule, TAKE 1 CAPSULE BY MOUTH TWICE A DAY, Disp: 180 capsule, Rfl: 1 .  Glucosamine Sulfate-MSM (MSM/GLUCOSAMINE) 250-250 MG CAPS, Take 1 tablet by mouth 2 (two) times daily. , Disp: , Rfl:  .  levothyroxine (SYNTHROID) 150 MCG tablet, TAKE 1 TABLET BY MOUTH ONCE DAILY BEFORE BREAKFAST, Disp: 90 tablet, Rfl: 1 .  Omega-3 Fatty Acids (FISH OIL) 500 MG CAPS, Take by mouth., Disp: , Rfl:  .  polyethylene glycol powder (MIRALAX) powder, Take 17 g by mouth at bedtime. , Disp: , Rfl:  .  sucralfate (CARAFATE) 1 g tablet, Take 1 tablet (1 g  total) by mouth 4 (four) times daily -  with meals and at bedtime., Disp: 360 tablet, Rfl: 1 .  TECFIDERA 240 MG CPDR, Take 1 capsule (240 mg total) by mouth 2 (two) times daily. Call [phone] to make appt for future refills, Disp: 60 capsule, Rfl: 0 .  traZODone (DESYREL) 50 MG tablet, , Disp: , Rfl:  .  venlafaxine XR (EFFEXOR-XR) 37.5 MG 24 hr capsule, , Disp: , Rfl:  .  vitamin C (ASCORBIC ACID) 500 MG tablet, Take 500 mg by mouth daily., Disp: , Rfl:  .  cyclobenzaprine (FLEXERIL) 10 MG tablet, Take 1 tablet (10 mg total) by mouth 3 (three) times daily as needed for muscle spasms. (Patient not taking: Reported on 03/28/2020), Disp: 30 tablet, Rfl: 0 .  ketoconazole (NIZORAL) 2 % cream, Apply 1 application topically daily. (Patient not taking: Reported on 03/28/2020), Disp: 15 g, Rfl: 0 .  methylPREDNISolone (MEDROL) 4 MG TBPK tablet, 6 day taper; take as directed on package instructions, Disp: 21 tablet, Rfl: 0 .  nystatin (MYCOSTATIN) 100000 UNIT/ML suspension, Take 5 mLs (500,000 Units total) by mouth 4 (four) times daily. (Patient not taking: Reported on 03/28/2020), Disp: 240 mL, Rfl: 0 .  predniSONE (DELTASONE) 20 MG tablet, Take 1  tablet (20 mg total) by mouth 2 (two) times daily with a meal. (Patient not taking: Reported on 01/15/2020), Disp: 14 tablet, Rfl: 0 .  promethazine (PHENERGAN) 12.5 MG tablet, Take 1 tablet (12.5 mg total) by mouth every 8 (eight) hours as needed for nausea or vomiting., Disp: 20 tablet, Rfl: 0  Current Facility-Administered Medications:  .  promethazine (PHENERGAN) injection 12.5-25 mg, 12.5-25 mg, Intramuscular, Q6H PRN, Pollak, Adriana M, PA-C, 25 mg at 01/06/19 1258  Review of Systems  Constitutional: Negative.   Respiratory: Negative.   Cardiovascular: Negative.   Musculoskeletal: Positive for arthralgias, gait problem and joint swelling.  Neurological: Negative for weakness and numbness.    Social History   Tobacco Use  . Smoking status: Never  Smoker  . Smokeless tobacco: Never Used  Substance Use Topics  . Alcohol use: No    Alcohol/week: 0.0 standard drinks      Objective:   BP (!) 142/90 (BP Location: Left Arm, Patient Position: Sitting, Cuff Size: Large)   Pulse 87   Temp (!) 97.1 F (36.2 C) (Temporal)   Resp 16   Wt (!) 321 lb (145.6 kg)   BMI 51.81 kg/m  Vitals:   03/28/20 1644  BP: (!) 142/90  Pulse: 87  Resp: 16  Temp: (!) 97.1 F (36.2 C)  TempSrc: Temporal  Weight: (!) 321 lb (145.6 kg)  Body mass index is 51.81 kg/m.   Physical Exam Vitals reviewed.  Constitutional:      General: She is not in acute distress.    Appearance: Normal appearance. She is well-developed. She is obese. She is not ill-appearing.  HENT:     Head: Normocephalic and atraumatic.  Pulmonary:     Effort: Pulmonary effort is normal. No respiratory distress.  Musculoskeletal:     Cervical back: Normal range of motion and neck supple.     Left knee: Swelling present. No effusion, erythema or bony tenderness. Normal range of motion. Tenderness present over the patellar tendon. Normal alignment, normal meniscus and normal patellar mobility. Normal pulse.  Neurological:     Mental Status: She is alert.  Psychiatric:        Mood and Affect: Mood normal.        Behavior: Behavior normal.        Thought Content: Thought content normal.        Judgment: Judgment normal.      No results found for any visits on 03/28/20.     Assessment & Plan    1. Contusion of left knee and lower leg, subsequent encounter Attempted effusion but unable to draw any fluid off, suspect more soft tissue swelling now. Medrol 80mg  injection given and tolerated well, see procedure note below. After care instructions printed and given to patient.  - methylPREDNISolone acetate (DEPO-MEDROL) injection 80 mg   Procedure Note: Benefits, risks (including infection, tattooing, adipose dimpling, and tendon rupture) and alternatives were explained to the  patient. All questions were sought and answered.  Patient agreed to continue and verbal consent was obtained.   An aspiration and steroid injection was performed on left knee using 4cc of 1% plain Xyloocaine and 80 mg of depo-medrol. There was minimal bleeding. Hemostasis was intact. A dry dressing was applied. The procedure was well tolerated.    Mar Daring, PA-C  [facility]

## 2020-03-28 NOTE — Patient Instructions (Signed)
Knee Injection A knee injection is a procedure to get medicine into your knee joint to relieve the pain, swelling, and stiffness of arthritis. Your health care provider uses a needle to inject medicine, which may also help to lubricate and cushion your knee joint. You may need more than one injection. Tell a health care provider about:  Any allergies you have.  All medicines you are taking, including vitamins, herbs, eye drops, creams, and over-the-counter medicines.  Any problems you or family members have had with anesthetic medicines.  Any blood disorders you have.  Any surgeries you have had.  Any medical conditions you have.  Whether you are pregnant or may be pregnant. What are the risks? Generally, this is a safe procedure. However, problems may occur, including:  Infection.  Bleeding.  Symptoms that get worse.  Damage to the area around your knee.  Allergic reaction to any of the medicines.  Skin reactions from repeated injections. What happens before the procedure?  Ask your health care provider about changing or stopping your regular medicines. This is especially important if you are taking diabetes medicines or blood thinners.  Plan to have someone take you home from the hospital or clinic. What happens during the procedure?   You will sit or lie down in a position for your knee to be treated.  The skin over your kneecap will be cleaned with a germ-killing soap.  You will be given a medicine that numbs the area (local anesthetic). You may feel some stinging.  The medicine will be injected into your knee. The needle is carefully placed between your kneecap and your knee. The medicine is injected into the joint space.  The needle will be removed at the end of the procedure.  A bandage (dressing) may be placed over the injection site. The procedure may vary among health care providers and hospitals. What can I expect after the procedure?  Your blood  pressure, heart rate, breathing rate, and blood oxygen level will be monitored until you leave the hospital or clinic.  You may have to move your knee through its full range of motion. This helps to get all the medicine into your joint space.  You will be watched to make sure that you do not have a reaction to the injected medicine.  You may feel more pain, swelling, and warmth than you did before the injection. This reaction may last about 1-2 days. Follow these instructions at home: Medicines  Take over-the-counter and prescription medicines only as told by your doctor.  Do not drive or use heavy machinery while taking prescription pain medicine.  Do not take medicines such as aspirin and ibuprofen unless your health care provider tells you to take them. Injection site care  Follow instructions from your health care provider about: ? How to take care of your puncture site. ? When and how you should change your dressing. ? When you should remove your dressing.  Check your injection area every day for signs of infection. Check for: ? More redness, swelling, or pain after 2 days. ? Fluid or blood. ? Pus or a bad smell. ? Warmth. Managing pain, stiffness, and swelling   If directed, put ice on the injection area: ? Put ice in a plastic bag. ? Place a towel between your skin and the bag. ? Leave the ice on for 20 minutes, 2-3 times per day.  Do not apply heat to your knee.  Raise (elevate) the injection area above the level   of your heart while you are sitting or lying down. General instructions  If you were given a dressing, keep it dry until your health care provider says it can be removed. Ask your health care provider when you can start showering or taking a bath.  Avoid strenuous activities for as long as directed by your health care provider. Ask your health care provider when you can return to your normal activities.  Keep all follow-up visits as told by your health  care provider. This is important. You may need more injections. Contact a health care provider if you have:  A fever.  Warmth in your injection area.  Fluid, blood, or pus coming from your injection site.  Symptoms at your injection site that last longer than 2 days after your procedure. Get help right away if:  Your knee: ? Turns very red. ? Becomes very swollen. ? Is in severe pain. Summary  A knee injection is a procedure to get medicine into your knee joint to relieve the pain, swelling, and stiffness of arthritis.  A needle is carefully placed between your kneecap and your knee to inject medicine into the joint space.  Before the procedure, ask your health care provider about changing or stopping your regular medicines, especially if you are taking diabetes medicines or blood thinners.  Contact your health care provider if you have any problems or questions after your procedure. This information is not intended to replace advice given to you by your health care provider. Make sure you discuss any questions you have with your health care provider. Document Revised: 01/06/2018 Document Reviewed: 01/06/2018 [employer]vier Patient Education  2020 [employer]  

## 2020-03-29 ENCOUNTER — Other Ambulatory Visit: Payer: Self-pay | Admitting: Physician Assistant

## 2020-03-29 DIAGNOSIS — M5431 Sciatica, right side: Secondary | ICD-10-CM

## 2020-03-30 DIAGNOSIS — F5104 Psychophysiologic insomnia: Secondary | ICD-10-CM | POA: Diagnosis not present

## 2020-03-30 DIAGNOSIS — I776 Arteritis, unspecified: Secondary | ICD-10-CM | POA: Diagnosis not present

## 2020-03-30 DIAGNOSIS — G4719 Other hypersomnia: Secondary | ICD-10-CM | POA: Diagnosis not present

## 2020-03-30 DIAGNOSIS — F331 Major depressive disorder, recurrent, moderate: Secondary | ICD-10-CM | POA: Diagnosis not present

## 2020-04-11 DIAGNOSIS — F331 Major depressive disorder, recurrent, moderate: Secondary | ICD-10-CM | POA: Diagnosis not present

## 2020-04-11 DIAGNOSIS — I776 Arteritis, unspecified: Secondary | ICD-10-CM | POA: Diagnosis not present

## 2020-04-11 DIAGNOSIS — G4719 Other hypersomnia: Secondary | ICD-10-CM | POA: Diagnosis not present

## 2020-04-11 DIAGNOSIS — F5104 Psychophysiologic insomnia: Secondary | ICD-10-CM | POA: Diagnosis not present

## 2020-04-19 DIAGNOSIS — F331 Major depressive disorder, recurrent, moderate: Secondary | ICD-10-CM | POA: Diagnosis not present

## 2020-04-19 DIAGNOSIS — F5104 Psychophysiologic insomnia: Secondary | ICD-10-CM | POA: Diagnosis not present

## 2020-04-19 DIAGNOSIS — I776 Arteritis, unspecified: Secondary | ICD-10-CM | POA: Diagnosis not present

## 2020-04-19 DIAGNOSIS — G4719 Other hypersomnia: Secondary | ICD-10-CM | POA: Diagnosis not present

## 2020-04-28 DIAGNOSIS — F331 Major depressive disorder, recurrent, moderate: Secondary | ICD-10-CM | POA: Diagnosis not present

## 2020-04-28 DIAGNOSIS — F5104 Psychophysiologic insomnia: Secondary | ICD-10-CM | POA: Diagnosis not present

## 2020-04-28 DIAGNOSIS — G4719 Other hypersomnia: Secondary | ICD-10-CM | POA: Diagnosis not present

## 2020-05-04 DIAGNOSIS — F5104 Psychophysiologic insomnia: Secondary | ICD-10-CM | POA: Diagnosis not present

## 2020-05-04 DIAGNOSIS — I776 Arteritis, unspecified: Secondary | ICD-10-CM | POA: Diagnosis not present

## 2020-05-04 DIAGNOSIS — F331 Major depressive disorder, recurrent, moderate: Secondary | ICD-10-CM | POA: Diagnosis not present

## 2020-05-04 DIAGNOSIS — G4719 Other hypersomnia: Secondary | ICD-10-CM | POA: Diagnosis not present

## 2020-05-16 NOTE — Progress Notes (Signed)
No SHOW

## 2020-05-17 ENCOUNTER — Telehealth (INDEPENDENT_AMBULATORY_CARE_PROVIDER_SITE_OTHER): Payer: BC Managed Care – PPO | Admitting: Physician Assistant

## 2020-05-17 DIAGNOSIS — Z5329 Procedure and treatment not carried out because of patient's decision for other reasons: Secondary | ICD-10-CM

## 2020-05-18 DIAGNOSIS — F331 Major depressive disorder, recurrent, moderate: Secondary | ICD-10-CM | POA: Diagnosis not present

## 2020-05-18 DIAGNOSIS — F5104 Psychophysiologic insomnia: Secondary | ICD-10-CM | POA: Diagnosis not present

## 2020-05-18 DIAGNOSIS — G4719 Other hypersomnia: Secondary | ICD-10-CM | POA: Diagnosis not present

## 2020-05-18 DIAGNOSIS — I776 Arteritis, unspecified: Secondary | ICD-10-CM | POA: Diagnosis not present

## 2020-06-15 DIAGNOSIS — G4719 Other hypersomnia: Secondary | ICD-10-CM | POA: Diagnosis not present

## 2020-06-15 DIAGNOSIS — F331 Major depressive disorder, recurrent, moderate: Secondary | ICD-10-CM | POA: Diagnosis not present

## 2020-06-15 DIAGNOSIS — F5104 Psychophysiologic insomnia: Secondary | ICD-10-CM | POA: Diagnosis not present

## 2020-06-15 DIAGNOSIS — Z79899 Other long term (current) drug therapy: Secondary | ICD-10-CM | POA: Diagnosis not present

## 2020-06-19 ENCOUNTER — Other Ambulatory Visit: Payer: Self-pay | Admitting: Physician Assistant

## 2020-06-19 DIAGNOSIS — E039 Hypothyroidism, unspecified: Secondary | ICD-10-CM

## 2020-06-19 NOTE — Telephone Encounter (Signed)
Requested medications are due for refill today?  Yes  Requested medications are on active medication list?  Yes  Last Refill:  12/17/2019  # 90 with one refill   Future visit scheduled?   No  Notes to Clinic:  Medication failed RX refill protocol due to no TSH in past 360 days.  Last TSH performed on 02/03/2018.

## 2020-06-27 DIAGNOSIS — Z79899 Other long term (current) drug therapy: Secondary | ICD-10-CM | POA: Diagnosis not present

## 2020-06-27 DIAGNOSIS — G35 Multiple sclerosis: Secondary | ICD-10-CM | POA: Diagnosis not present

## 2020-06-27 DIAGNOSIS — R239 Unspecified skin changes: Secondary | ICD-10-CM | POA: Diagnosis not present

## 2020-06-29 DIAGNOSIS — F331 Major depressive disorder, recurrent, moderate: Secondary | ICD-10-CM | POA: Diagnosis not present

## 2020-06-29 DIAGNOSIS — Z79899 Other long term (current) drug therapy: Secondary | ICD-10-CM | POA: Diagnosis not present

## 2020-06-29 DIAGNOSIS — G4719 Other hypersomnia: Secondary | ICD-10-CM | POA: Diagnosis not present

## 2020-06-29 DIAGNOSIS — F5104 Psychophysiologic insomnia: Secondary | ICD-10-CM | POA: Diagnosis not present

## 2020-07-06 DIAGNOSIS — H469 Unspecified optic neuritis: Secondary | ICD-10-CM | POA: Diagnosis not present

## 2020-07-06 DIAGNOSIS — Z8669 Personal history of other diseases of the nervous system and sense organs: Secondary | ICD-10-CM | POA: Diagnosis not present

## 2020-07-06 DIAGNOSIS — H04123 Dry eye syndrome of bilateral lacrimal glands: Secondary | ICD-10-CM | POA: Diagnosis not present

## 2020-07-06 DIAGNOSIS — G35 Multiple sclerosis: Secondary | ICD-10-CM | POA: Diagnosis not present

## 2020-07-21 DIAGNOSIS — G4719 Other hypersomnia: Secondary | ICD-10-CM | POA: Diagnosis not present

## 2020-07-21 DIAGNOSIS — F5104 Psychophysiologic insomnia: Secondary | ICD-10-CM | POA: Diagnosis not present

## 2020-07-21 DIAGNOSIS — F331 Major depressive disorder, recurrent, moderate: Secondary | ICD-10-CM | POA: Diagnosis not present

## 2020-07-27 ENCOUNTER — Encounter: Payer: Self-pay | Admitting: Physician Assistant

## 2020-07-27 DIAGNOSIS — K219 Gastro-esophageal reflux disease without esophagitis: Secondary | ICD-10-CM

## 2020-07-27 DIAGNOSIS — F5104 Psychophysiologic insomnia: Secondary | ICD-10-CM | POA: Diagnosis not present

## 2020-07-27 DIAGNOSIS — F331 Major depressive disorder, recurrent, moderate: Secondary | ICD-10-CM | POA: Diagnosis not present

## 2020-07-27 DIAGNOSIS — G4719 Other hypersomnia: Secondary | ICD-10-CM | POA: Diagnosis not present

## 2020-07-28 MED ORDER — DEXILANT 60 MG PO CPDR: 60.0000 mg | DELAYED_RELEASE_CAPSULE | Freq: Every day | ORAL | 1 refills | Status: DC

## 2020-08-01 ENCOUNTER — Ambulatory Visit (INDEPENDENT_AMBULATORY_CARE_PROVIDER_SITE_OTHER): Payer: BC Managed Care – PPO | Admitting: Physician Assistant

## 2020-08-01 ENCOUNTER — Other Ambulatory Visit: Payer: Self-pay

## 2020-08-01 ENCOUNTER — Other Ambulatory Visit (HOSPITAL_COMMUNITY)
Admission: RE | Admit: 2020-08-01 | Discharge: 2020-08-01 | Disposition: A | Discharge status: Home / Self Care | Payer: BC Managed Care – PPO | Source: Ambulatory Visit | Attending: Physician Assistant | Admitting: Physician Assistant

## 2020-08-01 ENCOUNTER — Encounter: Payer: Self-pay | Admitting: Physician Assistant

## 2020-08-01 VITALS — BP 139/84 | HR 89 | Temp 98.8°F | Ht 66.0 in | Wt 321.0 lb

## 2020-08-01 DIAGNOSIS — Z1239 Encounter for other screening for malignant neoplasm of breast: Secondary | ICD-10-CM

## 2020-08-01 DIAGNOSIS — Z124 Encounter for screening for malignant neoplasm of cervix: Secondary | ICD-10-CM | POA: Insufficient documentation

## 2020-08-01 DIAGNOSIS — G8929 Other chronic pain: Secondary | ICD-10-CM

## 2020-08-01 DIAGNOSIS — M545 Low back pain, unspecified: Secondary | ICD-10-CM

## 2020-08-01 DIAGNOSIS — G35 Multiple sclerosis: Secondary | ICD-10-CM

## 2020-08-01 DIAGNOSIS — W19XXXS Unspecified fall, sequela: Secondary | ICD-10-CM | POA: Diagnosis not present

## 2020-08-01 DIAGNOSIS — F331 Major depressive disorder, recurrent, moderate: Secondary | ICD-10-CM

## 2020-08-01 DIAGNOSIS — K219 Gastro-esophageal reflux disease without esophagitis: Secondary | ICD-10-CM | POA: Diagnosis not present

## 2020-08-01 DIAGNOSIS — I10 Essential (primary) hypertension: Secondary | ICD-10-CM

## 2020-08-01 DIAGNOSIS — G4733 Obstructive sleep apnea (adult) (pediatric): Secondary | ICD-10-CM

## 2020-08-01 DIAGNOSIS — M546 Pain in thoracic spine: Secondary | ICD-10-CM | POA: Diagnosis not present

## 2020-08-01 DIAGNOSIS — Z0000 Encounter for general adult medical examination without abnormal findings: Secondary | ICD-10-CM | POA: Diagnosis not present

## 2020-08-01 DIAGNOSIS — Z6841 Body Mass Index (BMI) 40.0 and over, adult: Secondary | ICD-10-CM

## 2020-08-01 MED ORDER — PANTOPRAZOLE SODIUM 40 MG PO TBEC: 40.0000 mg | DELAYED_RELEASE_TABLET | Freq: Two times a day (BID) | ORAL | 1 refills | Status: DC

## 2020-08-01 NOTE — Progress Notes (Signed)
Complete physical exam   Patient: Patricia Deleon   DOB: Apr 14, 1961   59 y.o. Female  MRN: [id number] Visit Date: 08/01/2020  Today's healthcare provider: Mar Daring, PA-C   Chief Complaint  Patient presents with  . Annual Exam   Subjective    Patricia Deleon is a 59 y.o. female who presents today for a complete physical exam.  She reports consuming a general diet. The patient does not participate in regular exercise at present. She generally feels well. She reports sleeping well. She does have additional problems to discuss today.  HPI  Pt states her insurance will not cover Dexilant anymore.    Past Medical History:  Diagnosis Date  . Agoraphobia with panic disorder   . Anemia   . Depression   . Erosive gastritis   . Fundic gland polyps of stomach, benign   . Geographic tongue   . GERD (gastroesophageal reflux disease)   . History of esophagogastroduodenoscopy (EGD)   . Hypercholesterolemia   . Hypertension   . Intervertebral disc disorder    thoracic,thoracolumber,lumbosacral  . Multiple sclerosis ([place])   . Obesity   . Sleep apnea    Past Surgical History:  Procedure Laterality Date  . COLONOSCOPY    . COLONOSCOPY WITH PROPOFOL N/A 07/07/2018   Procedure: COLONOSCOPY WITH PROPOFOL;  Surgeon: Lollie Sails, MD;  Location: [facility] ENDOSCOPY;  Service: Endoscopy;  Laterality: N/A;  . ESOPHAGOGASTRODUODENOSCOPY (EGD) WITH PROPOFOL N/A 01/31/2016   Procedure: ESOPHAGOGASTRODUODENOSCOPY (EGD) WITH PROPOFOL;  Surgeon: Lollie Sails, MD;  Location: [facility] ENDOSCOPY;  Service: Endoscopy;  Laterality: N/A;  . IR FLUORO GUIDE CV LINE RIGHT  10/06/2018  . IR REMOVAL TUN CV CATH W/O FL  10/15/2018  . IR US GUIDE VASC ACCESS RIGHT  10/06/2018  . NISSEN FUNDOPLICATION     Social History   Socioeconomic History  . Marital status: Married    Spouse name: Not on file  . Number of children: Not on file  . Years of education: Not on file  . Highest  education level: Not on file  Occupational History  . Not on file  Tobacco Use  . Smoking status: Never Smoker  . Smokeless tobacco: Never Used  Vaping Use  . Vaping Use: Never used  Substance and Sexual Activity  . Alcohol use: No    Alcohol/week: 0.0 standard drinks  . Drug use: No  . Sexual activity: Not on file    Comment: Married   Other Topics Concern  . Not on file  Social History Narrative  . Not on file   Social Determinants of Health   Financial Resource Strain:   . Difficulty of Paying Living Expenses:   Food Insecurity:   . Worried About Charity fundraiser in the Last Year:   . Arboriculturist in the Last Year:   Transportation Needs:   . Film/video editor (Medical):   Marland Kitchen Lack of Transportation (Non-Medical):   Physical Activity:   . Days of Exercise per Week:   . Minutes of Exercise per Session:   Stress:   . Feeling of Stress :   Social Connections:   . Frequency of Communication with Friends and Family:   . Frequency of Social Gatherings with Friends and Family:   . Attends Religious Services:   . Active Member of Clubs or Organizations:   . Attends Archivist Meetings:   Marland Kitchen Marital Status:   Intimate Partner Violence:   .  Fear of Current or Ex-Partner:   . Emotionally Abused:   Marland Kitchen Physically Abused:   . Sexually Abused:    Family Status  Relation Name Status  . Mother  Deceased  . Father  Deceased  . Brother  Alive  . Son  Alive  . MGF  Deceased  . PGM  Deceased  . MGM  (Not Specified)   Family History  Problem Relation Age of Onset  . Lung cancer Mother   . Hypertension Mother   . Alcohol abuse Mother   . Anxiety disorder Mother   . Hypertension Father   . Arthritis Father   . Alcohol abuse Father   . Esophageal cancer Father   . Alcohol abuse Brother   . Hypertension Brother   . Autism Son   . Coronary artery disease Maternal Grandfather   . Heart attack Maternal Grandfather   . CVA Paternal Grandmother   . Skin  cancer Paternal Grandmother   . Cancer Maternal Grandmother    Allergies  Allergen Reactions  . Shellfish Allergy Hives    Patient Care Team: Rubye Beach as PCP - General (Family Medicine)   Medications: Outpatient Medications Prior to Visit  Medication Sig  . amLODipine (NORVASC) 5 MG tablet TAKE 1 TABLET BY MOUTH EVERY DAY  . Armodafinil (NUVIGIL) 200 MG TABS One po qAM (Patient taking differently: Take 250 mg by mouth daily as needed. One po qAM)  . aspirin 81 MG chewable tablet Chew 81 mg by mouth daily.  . Cholecalciferol (VITAMIN D3) 2000 UNITS capsule Take 4,000 Units by mouth daily.   Marland Kitchen dexlansoprazole (DEXILANT) 60 MG capsule Take 1 capsule (60 mg total) by mouth daily.  . ferrous sulfate 325 (65 FE) MG tablet Take 325 mg by mouth every evening.   . Ferrous Sulfate Dried (HM SLOW RELEASE IRON) 45 MG TBCR Take by mouth.  . gabapentin (NEURONTIN) 100 MG capsule TAKE 1 CAPSULE BY MOUTH TWICE A DAY  . Glucosamine Sulfate-MSM (MSM/GLUCOSAMINE) 250-250 MG CAPS Take 1 tablet by mouth 2 (two) times daily.   Marland Kitchen ketoconazole (NIZORAL) 2 % cream Apply 1 application topically daily.  Marland Kitchen levothyroxine (SYNTHROID) 150 MCG tablet TAKE 1 TABLET BY MOUTH ONCE DAILY BEFORE BREAKFAST  . Omega-3 Fatty Acids (FISH OIL) 500 MG CAPS Take by mouth.  . polyethylene glycol powder (MIRALAX) powder Take 17 g by mouth at bedtime.   . sucralfate (CARAFATE) 1 g tablet Take 1 tablet (1 g total) by mouth 4 (four) times daily -  with meals and at bedtime.  . TECFIDERA 240 MG CPDR Take 1 capsule (240 mg total) by mouth 2 (two) times daily. Call [phone] to make appt for future refills  . traZODone (DESYREL) 50 MG tablet   . venlafaxine XR (EFFEXOR-XR) 37.5 MG 24 hr capsule   . vitamin C (ASCORBIC ACID) 500 MG tablet Take 500 mg by mouth daily.  . cyclobenzaprine (FLEXERIL) 10 MG tablet Take 1 tablet (10 mg total) by mouth 3 (three) times daily as needed for muscle spasms. (Patient not taking:  Reported on 03/28/2020)  . etodolac (LODINE) 500 MG tablet TAKE 1 TABLET BY MOUTH TWICE A DAY  . nystatin (MYCOSTATIN) 100000 UNIT/ML suspension Take 5 mLs (500,000 Units total) by mouth 4 (four) times daily. (Patient not taking: Reported on 03/28/2020)   No facility-administered medications prior to visit.    Review of Systems  Constitutional: Positive for diaphoresis and fatigue. Negative for activity change, appetite change, chills, fever and unexpected  weight change.  HENT: Negative.   Eyes: Negative.   Respiratory: Positive for apnea. Negative for cough, choking, chest tightness, shortness of breath, wheezing and stridor.   Cardiovascular: Negative.   Gastrointestinal: Negative.   Endocrine: Positive for heat intolerance. Negative for cold intolerance, polydipsia, polyphagia and polyuria.  Genitourinary: Positive for enuresis. Negative for decreased urine volume, difficulty urinating, dyspareunia, dysuria, flank pain, frequency, genital sores, hematuria, menstrual problem, pelvic pain, urgency, vaginal bleeding, vaginal discharge and vaginal pain.  Musculoskeletal: Positive for back pain. Negative for arthralgias, gait problem, joint swelling, myalgias, neck pain and neck stiffness.  Skin: Negative.   Allergic/Immunologic: Negative.   Neurological: Positive for dizziness. Negative for tremors, seizures, syncope, facial asymmetry, speech difficulty, light-headedness, numbness and headaches.  Hematological: Negative.  Negative for adenopathy. Does not bruise/bleed easily.  Psychiatric/Behavioral: Positive for decreased concentration. Negative for agitation, behavioral problems, confusion, dysphoric mood, hallucinations, self-injury, sleep disturbance and suicidal ideas. The patient is not nervous/anxious and is not hyperactive.     Last CBC Lab Results  Component Value Date   WBC 3.3 (L) 03/09/2020   HGB 13.5 03/09/2020   HCT 40.8 03/09/2020   MCV 83 03/09/2020   MCH 27.4 03/09/2020     RDW 14.8 03/09/2020   PLT 222 64/33/2951   Last metabolic panel Lab Results  Component Value Date   GLUCOSE 90 04/03/2019   NA 137 04/03/2019   K 3.9 04/03/2019   CL 101 04/03/2019   CO2 25 04/03/2019   BUN 18 04/03/2019   CREATININE 0.81 04/03/2019   GFRNONAA >60 04/03/2019   GFRAA >60 04/03/2019   CALCIUM 9.0 04/03/2019   PROT 7.5 04/03/2019   ALBUMIN 4.2 04/03/2019   LABGLOB 3.1 02/03/2018   AGRATIO 1.5 02/03/2018   BILITOT 0.5 04/03/2019   ALKPHOS 80 04/03/2019   AST 17 04/03/2019   ALT 17 04/03/2019   ANIONGAP 11 04/03/2019      Objective    BP 139/84 (BP Location: Left Arm, Patient Position: Sitting, Cuff Size: Large)   Pulse 89   Temp 98.8 F (37.1 C) (Oral)   Ht 5\' 6"  (1.676 m)   Wt (!) 321 lb (145.6 kg)   BMI 51.81 kg/m  BP Readings from Last 3 Encounters:  08/01/20 139/84  03/28/20 (!) 142/90  02/22/20 (!) 146/95   Wt Readings from Last 3 Encounters:  08/01/20 (!) 321 lb (145.6 kg)  03/28/20 (!) 321 lb (145.6 kg)  02/22/20 (!) 325 lb (147.4 kg)      Physical Exam Vitals reviewed.  Constitutional:      General: She is not in acute distress.    Appearance: Normal appearance. She is well-developed. She is obese. She is not ill-appearing or diaphoretic.  HENT:     Head: Normocephalic and atraumatic.     Right Ear: Hearing, tympanic membrane, ear canal and external ear normal.     Left Ear: Hearing, tympanic membrane, ear canal and external ear normal.     Nose: Nose normal.     Mouth/Throat:     Mouth: Mucous membranes are moist.     Pharynx: Oropharynx is clear. Uvula midline. No oropharyngeal exudate or posterior oropharyngeal erythema.  Eyes:     General: No scleral icterus.       Right eye: No discharge.        Left eye: No discharge.     Extraocular Movements: Extraocular movements intact.     Conjunctiva/sclera: Conjunctivae normal.     Pupils: Pupils are equal, round, and  reactive to light.  Neck:     Thyroid: No thyromegaly.      Vascular: No carotid bruit or JVD.     Trachea: No tracheal deviation.  Cardiovascular:     Rate and Rhythm: Normal rate and regular rhythm.     Pulses: Normal pulses.     Heart sounds: Normal heart sounds. No murmur heard.  No friction rub. No gallop.   Pulmonary:     Effort: Pulmonary effort is normal. No respiratory distress.     Breath sounds: Normal breath sounds. No wheezing or rales.  Chest:     Chest wall: No tenderness.     Breasts: Breasts are symmetrical.        Right: No inverted nipple, mass, nipple discharge, skin change or tenderness.        Left: No inverted nipple, mass, nipple discharge, skin change or tenderness.  Abdominal:     General: Bowel sounds are normal. There is no distension.     Palpations: Abdomen is soft. There is no mass.     Tenderness: There is no abdominal tenderness. There is no guarding or rebound.     Hernia: There is no hernia in the left inguinal area.  Genitourinary:    Exam position: Supine.     Labia:        Right: No rash, tenderness, lesion or injury.        Left: No rash, tenderness, lesion or injury.      Vagina: Normal. No signs of injury. No vaginal discharge, erythema, tenderness or bleeding.     Cervix: No cervical motion tenderness, discharge or friability.     Adnexa:        Right: No mass, tenderness or fullness.         Left: No mass, tenderness or fullness.       Rectum: Normal.  Musculoskeletal:        General: No tenderness. Normal range of motion.     Cervical back: Normal range of motion and neck supple.     Right lower leg: No edema.     Left lower leg: No edema.  Lymphadenopathy:     Cervical: No cervical adenopathy.  Skin:    General: Skin is warm and dry.     Capillary Refill: Capillary refill takes less than 2 seconds.     Findings: No rash.  Neurological:     General: No focal deficit present.     Mental Status: She is alert and oriented to person, place, and time. Mental status is at baseline.     Cranial  Nerves: No cranial nerve deficit.     Coordination: Coordination normal.     Deep Tendon Reflexes: Reflexes are normal and symmetric.  Psychiatric:        Mood and Affect: Mood normal.        Behavior: Behavior normal.        Thought Content: Thought content normal.        Judgment: Judgment normal.       Last depression screening scores PHQ 2/9 Scores 03/28/2020 05/28/2019 02/03/2018  PHQ - 2 Score 2 4 2   PHQ- 9 Score 12 16 7    Last fall risk screening Fall Risk  02/03/2018  Falls in the past year? No   Last Audit-C alcohol use screening No flowsheet data found. A score of 3 or more in women, and 4 or more in men indicates increased risk for alcohol abuse, EXCEPT if all of the  points are from question 1   No results found for any visits on 08/01/20.  Assessment & Plan    Routine Health Maintenance and Physical Exam  Exercise Activities and Dietary recommendations Goals   None     Immunization History  Administered Date(s) Administered  . Influenza Split 09/21/2011  . Influenza,inj,Quad PF,6+ Mos 10/12/2015  . Influenza-Unspecified 10/16/2016  . Tdap 02/21/2011    Health Maintenance  Topic Date Due  . COVID-19 Vaccine (1) Never done  . PAP SMEAR-Modifier  Never done  . MAMMOGRAM  02/18/2020  . INFLUENZA VACCINE  09/01/2020 (Originally 07/31/2020)  . TETANUS/TDAP  02/21/2021  . COLONOSCOPY  07/07/2028  . Hepatitis C Screening  Completed  . HIV Screening  Completed    Discussed health benefits of physical activity, and encouraged her to engage in regular exercise appropriate for her age and condition.  1. Annual physical exam Normal physical exam today. Will check labs as below and f/u pending lab results. If labs are stable and WNL she will not need to have these rechecked for one year at her next annual physical exam. She is to call the office in the meantime if she has any acute issue, questions or concerns.  2. Encounter for breast cancer screening using  non-mammogram modality Breast exam today was normal. There is no family history of breast cancer. She does perform regular self breast exams. Mammogram was ordered as below. Information for [facility] clinic was given to patient so she may schedule her mammogram at her convenience. - MM 3D SCREEN BREAST BILATERAL; Future  3. Cervical cancer screening Pap collected today. Will send as below and f/u pending results. - Cytology - PAP  4. Gastroesophageal reflux disease without esophagitis Dexilant denied by insurance. Will change to Protonix but with BID use for better coverage. Call if not improving.  - pantoprazole (PROTONIX) 40 MG tablet; Take 1 tablet (40 mg total) by mouth 2 (two) times daily before a meal.  Dispense: 180 tablet; Refill: 1  5. Essential hypertension Stable. Continue Amlodipine 5mg . Will check labs as below and f/u pending results. - CBC w/Diff/Platelet - Comprehensive Metabolic Panel (CMET) - TSH - Lipid Panel With LDL/HDL Ratio - HgB A1c  6. Obstructive sleep apnea syndrome Stable. Uses CPAP nightly without issue.  - CBC w/Diff/Platelet - Comprehensive Metabolic Panel (CMET) - TSH - Lipid Panel With LDL/HDL Ratio - HgB A1c  7. Class 3 severe obesity due to excess calories with serious comorbidity and body mass index (BMI) of 50.0 to 59.9 in adult [facility]) Counseled patient on healthy lifestyle modifications including dieting and exercise.  Will check labs as below and f/u pending results. - CBC w/Diff/Platelet - Comprehensive Metabolic Panel (CMET) - TSH - Lipid Panel With LDL/HDL Ratio - HgB A1c  8. Multiple sclerosis (HCC) Followed by MS specialist with [facility].   9. Moderate episode of recurrent major depressive disorder (HCC) Stable. Followed by psychiatry. Continue Venlafaxine XR 37.5mg , trazodone 50mg .  10. Fall, sequela [place] a couple of weeks ago and still having pain in mid and low back. Will get imaging as below to make sure no acute bony  injury. I will f/u pending results.  - DG Lumbar Spine Complete; Future - DG Thoracic Spine W/Swimmers; Future  11. Chronic right-sided thoracic back pain See above medical treatment plan. - DG Lumbar Spine Complete; Future - DG Thoracic Spine W/Swimmers; Future  12. Chronic right-sided low back pain without sciatica See above medical treatment plan. - DG Lumbar  Spine Complete; Future - DG Thoracic Spine W/Swimmers; Future   No follow-ups on file.     Reynolds Bowl, PA-C, have reviewed all documentation for this visit. The documentation on 08/21/20 for the exam, diagnosis, procedures, and orders are all accurate and complete.   Rubye Beach  [facility] [phone] (phone) [phone] (fax)  [place]

## 2020-08-01 NOTE — Patient Instructions (Addendum)
[facility] °[address] °[place],  [place]  [address] °Main: [phone] ° ° °Health Maintenance for Postmenopausal Women °Menopause is a normal process in which your ability to get pregnant comes to an end. This process happens slowly over many months or years, usually between the ages of 48 and 55. Menopause is complete when you have missed your menstrual periods for 12 months. °It is important to talk with your health care provider about some of the most common conditions that affect women after menopause (postmenopausal women). These include heart disease, cancer, and bone loss (osteoporosis). Adopting a healthy lifestyle and getting preventive care can help to promote your health and wellness. The actions you take can also lower your chances of developing some of these common conditions. °What should I know about menopause? °During menopause, you may get a number of symptoms, such as: °· Hot flashes. These can be moderate or severe. °· Night sweats. °· Decrease in sex drive. °· Mood swings. °· Headaches. °· Tiredness. °· Irritability. °· Memory problems. °· Insomnia. °Choosing to treat or not to treat these symptoms is a decision that you make with your health care provider. °Do I need hormone replacement therapy? °· Hormone replacement therapy is effective in treating symptoms that are caused by menopause, such as hot flashes and night sweats. °· Hormone replacement carries certain risks, especially as you become older. If you are thinking about using estrogen or estrogen with progestin, discuss the benefits and risks with your health care provider. °What is my risk for heart disease and stroke? °The risk of heart disease, heart attack, and stroke increases as you age. One of the causes may be a change in the body's hormones during menopause. This can affect how your body uses dietary fats, triglycerides, and cholesterol. Heart attack and stroke are medical  emergencies. There are many things that you can do to help prevent heart disease and stroke. °Watch your blood pressure °· High blood pressure causes heart disease and increases the risk of stroke. This is more likely to develop in people who have high blood pressure readings, are of African descent, or are overweight. °· Have your blood pressure checked: °? Every 3-5 years if you are 18-39 years of age. °? Every year if you are 40 years old or older. °Eat a healthy diet ° °· Eat a diet that includes plenty of vegetables, fruits, low-fat dairy products, and lean protein. °· Do not eat a lot of foods that are high in solid fats, added sugars, or sodium. °Get regular exercise °Get regular exercise. This is one of the most important things you can do for your health. Most adults should: °· Try to exercise for at least 150 minutes each week. The exercise should increase your heart rate and make you sweat (moderate-intensity exercise). °· Try to do strengthening exercises at least twice each week. Do these in addition to the moderate-intensity exercise. °· Spend less time sitting. Even light physical activity can be beneficial. °Other tips °· Work with your health care provider to achieve or maintain a healthy weight. °· Do not use any products that contain nicotine or tobacco, such as cigarettes, e-cigarettes, and chewing tobacco. If you need help quitting, ask your health care provider. °· Know your numbers. Ask your health care provider to check your cholesterol and your blood sugar (glucose). Continue to have your blood tested as directed by your health care provider. °Do I need screening for cancer? °Depending on   your health history and family history, you may need to have cancer screening at different stages of your life. This may include screening for: °· Breast cancer. °· Cervical cancer. °· Lung cancer. °· Colorectal cancer. °What is my risk for osteoporosis? °After menopause, you may be at increased risk for  osteoporosis. Osteoporosis is a condition in which bone destruction happens more quickly than new bone creation. To help prevent osteoporosis or the bone fractures that can happen because of osteoporosis, you may take the following actions: °· If you are 19-50 years old, get at least 1,000 mg of calcium and at least 600 mg of vitamin D per day. °· If you are older than age 50 but younger than age 70, get at least 1,200 mg of calcium and at least 600 mg of vitamin D per day. °· If you are older than age 70, get at least 1,200 mg of calcium and at least 800 mg of vitamin D per day. °Smoking and drinking excessive alcohol increase the risk of osteoporosis. Eat foods that are rich in calcium and vitamin D, and do weight-bearing exercises several times each week as directed by your health care provider. °How does menopause affect my mental health? °Depression may occur at any age, but it is more common as you become older. Common symptoms of depression include: °· Low or sad mood. °· Changes in sleep patterns. °· Changes in appetite or eating patterns. °· Feeling an overall lack of motivation or enjoyment of activities that you previously enjoyed. °· Frequent crying spells. °Talk with your health care provider if you think that you are experiencing depression. °General instructions °See your health care provider for regular wellness exams and vaccines. This may include: °· Scheduling regular health, dental, and eye exams. °· Getting and maintaining your vaccines. These include: °? Influenza vaccine. Get this vaccine each year before the flu season begins. °? Pneumonia vaccine. °? Shingles vaccine. °? Tetanus, diphtheria, and pertussis (Tdap) booster vaccine. °Your health care provider may also recommend other immunizations. °Tell your health care provider if you have ever been abused or do not feel safe at home. °Summary °· Menopause is a normal process in which your ability to get pregnant comes to an end. °· This  condition causes hot flashes, night sweats, decreased interest in sex, mood swings, headaches, or lack of sleep. °· Treatment for this condition may include hormone replacement therapy. °· Take actions to keep yourself healthy, including exercising regularly, eating a healthy diet, watching your weight, and checking your blood pressure and blood sugar levels. °· Get screened for cancer and depression. Make sure that you are up to date with all your vaccines. °This information is not intended to replace advice given to you by your health care provider. Make sure you discuss any questions you have with your health care provider. °Document Revised: 12/10/2018 Document Reviewed: 12/10/2018 °Elsevier Patient Education © 2020 [employer] ° °

## 2020-08-02 ENCOUNTER — Ambulatory Visit
Admission: RE | Admit: 2020-08-02 | Discharge: 2020-08-02 | Disposition: A | Discharge status: Home / Self Care | Payer: BC Managed Care – PPO | Attending: Physician Assistant | Admitting: Physician Assistant

## 2020-08-02 ENCOUNTER — Telehealth: Payer: Self-pay

## 2020-08-02 ENCOUNTER — Ambulatory Visit
Admission: RE | Admit: 2020-08-02 | Discharge: 2020-08-02 | Disposition: A | Discharge status: Home / Self Care | Payer: BC Managed Care – PPO | Source: Ambulatory Visit | Attending: Physician Assistant | Admitting: Physician Assistant

## 2020-08-02 DIAGNOSIS — M47816 Spondylosis without myelopathy or radiculopathy, lumbar region: Secondary | ICD-10-CM | POA: Insufficient documentation

## 2020-08-02 DIAGNOSIS — M48061 Spinal stenosis, lumbar region without neurogenic claudication: Secondary | ICD-10-CM | POA: Diagnosis not present

## 2020-08-02 DIAGNOSIS — I10 Essential (primary) hypertension: Secondary | ICD-10-CM | POA: Diagnosis not present

## 2020-08-02 DIAGNOSIS — M546 Pain in thoracic spine: Secondary | ICD-10-CM | POA: Diagnosis not present

## 2020-08-02 DIAGNOSIS — W19XXXS Unspecified fall, sequela: Secondary | ICD-10-CM | POA: Insufficient documentation

## 2020-08-02 DIAGNOSIS — M545 Low back pain, unspecified: Secondary | ICD-10-CM

## 2020-08-02 DIAGNOSIS — K449 Diaphragmatic hernia without obstruction or gangrene: Secondary | ICD-10-CM | POA: Diagnosis not present

## 2020-08-02 DIAGNOSIS — G8929 Other chronic pain: Secondary | ICD-10-CM

## 2020-08-02 DIAGNOSIS — M47814 Spondylosis without myelopathy or radiculopathy, thoracic region: Secondary | ICD-10-CM | POA: Diagnosis not present

## 2020-08-02 DIAGNOSIS — G4733 Obstructive sleep apnea (adult) (pediatric): Secondary | ICD-10-CM | POA: Diagnosis not present

## 2020-08-02 DIAGNOSIS — Z6841 Body Mass Index (BMI) 40.0 and over, adult: Secondary | ICD-10-CM | POA: Diagnosis not present

## 2020-08-02 NOTE — Telephone Encounter (Signed)
-----   Message from Mar Daring, [place] sent at 08/02/2020  3:59 PM EDT ----- Lumbar spine xray does show just mild degenerative changes.

## 2020-08-02 NOTE — Telephone Encounter (Signed)
-----   Message from Mar Daring, [place] sent at 08/02/2020  4:00 PM EDT ----- Mild arthritic changes noted in thoracic spine as well. Also noted to have a hiatal hernia which of course plays a part in the refractory GERD

## 2020-08-02 NOTE — Telephone Encounter (Signed)
Written by Mar Daring, PA-C on 08/02/2020 3:59 PM EDT Seen by patient Lonny Prude on 08/02/2020 4:59 PM

## 2020-08-03 LAB — CBC WITH DIFFERENTIAL/PLATELET
Basophils Absolute: 0 10*3/uL (ref 0.0–0.2)
Basos: 1 %
EOS (ABSOLUTE): 0 10*3/uL (ref 0.0–0.4)
Eos: 1 %
Hematocrit: 41.6 % (ref 34.0–46.6)
Hemoglobin: 13.6 g/dL (ref 11.1–15.9)
Immature Grans (Abs): 0 10*3/uL (ref 0.0–0.1)
Immature Granulocytes: 0 %
Lymphocytes Absolute: 0.2 10*3/uL — ABNORMAL LOW (ref 0.7–3.1)
Lymphs: 5 %
MCH: 26.5 pg — ABNORMAL LOW (ref 26.6–33.0)
MCHC: 32.7 g/dL (ref 31.5–35.7)
MCV: 81 fL (ref 79–97)
Monocytes Absolute: 0.2 10*3/uL (ref 0.1–0.9)
Monocytes: 5 %
Neutrophils Absolute: 3.2 10*3/uL (ref 1.4–7.0)
Neutrophils: 88 %
Platelets: 204 10*3/uL (ref 150–450)
RBC: 5.14 x10E6/uL (ref 3.77–5.28)
RDW: 14.7 % (ref 11.7–15.4)
WBC: 3.7 10*3/uL (ref 3.4–10.8)

## 2020-08-03 LAB — COMPREHENSIVE METABOLIC PANEL
ALT: 24 IU/L (ref 0–32)
AST: 23 IU/L (ref 0–40)
Albumin/Globulin Ratio: 1.6 (ref 1.2–2.2)
Albumin: 4.4 g/dL (ref 3.8–4.9)
Alkaline Phosphatase: 106 IU/L (ref 48–121)
BUN/Creatinine Ratio: 21 (ref 9–23)
BUN: 20 mg/dL (ref 6–24)
Bilirubin Total: 0.3 mg/dL (ref 0.0–1.2)
CO2: 22 mmol/L (ref 20–29)
Calcium: 9.1 mg/dL (ref 8.7–10.2)
Chloride: 101 mmol/L (ref 96–106)
Creatinine, Ser: 0.95 mg/dL (ref 0.57–1.00)
GFR calc Af Amer: 77 mL/min/{1.73_m2} (ref >59)
GFR calc non Af Amer: 67 mL/min/{1.73_m2} (ref >59)
Globulin, Total: 2.8 g/dL (ref 1.5–4.5)
Glucose: 94 mg/dL (ref 65–99)
Potassium: 4.3 mmol/L (ref 3.5–5.2)
Sodium: 138 mmol/L (ref 134–144)
Total Protein: 7.2 g/dL (ref 6.0–8.5)

## 2020-08-03 LAB — LIPID PANEL WITH LDL/HDL RATIO
Cholesterol, Total: 244 mg/dL — ABNORMAL HIGH (ref 100–199)
HDL: 40 mg/dL (ref >39)
LDL Chol Calc (NIH): 162 mg/dL — ABNORMAL HIGH (ref 0–99)
LDL/HDL Ratio: 4.1 ratio — ABNORMAL HIGH (ref 0.0–3.2)
Triglycerides: 224 mg/dL — ABNORMAL HIGH (ref 0–149)
VLDL Cholesterol Cal: 42 mg/dL — ABNORMAL HIGH (ref 5–40)

## 2020-08-03 LAB — HEMOGLOBIN A1C
Est. average glucose Bld gHb Est-mCnc: 120 mg/dL
Hgb A1c MFr Bld: 5.8 % — ABNORMAL HIGH (ref 4.8–5.6)

## 2020-08-03 LAB — TSH: TSH: 4.1 u[IU]/mL (ref 0.450–4.500)

## 2020-08-04 ENCOUNTER — Encounter: Payer: Self-pay | Admitting: Physician Assistant

## 2020-08-04 DIAGNOSIS — E7800 Pure hypercholesterolemia, unspecified: Secondary | ICD-10-CM

## 2020-08-04 LAB — CYTOLOGY - PAP
Comment: NEGATIVE
Diagnosis: NEGATIVE
High risk HPV: NEGATIVE

## 2020-08-04 MED ORDER — SIMVASTATIN 20 MG PO TABS: 20.0000 mg | ORAL_TABLET | Freq: Every day | ORAL | 3 refills | Status: DC

## 2020-08-23 ENCOUNTER — Other Ambulatory Visit: Payer: Self-pay

## 2020-08-23 ENCOUNTER — Ambulatory Visit
Admission: RE | Admit: 2020-08-23 | Discharge: 2020-08-23 | Disposition: A | Discharge status: Home / Self Care | Payer: BC Managed Care – PPO | Source: Ambulatory Visit | Attending: Physician Assistant | Admitting: Physician Assistant

## 2020-08-23 DIAGNOSIS — Z1231 Encounter for screening mammogram for malignant neoplasm of breast: Secondary | ICD-10-CM | POA: Diagnosis not present

## 2020-08-23 DIAGNOSIS — Z1239 Encounter for other screening for malignant neoplasm of breast: Secondary | ICD-10-CM

## 2020-08-24 ENCOUNTER — Telehealth: Payer: Self-pay

## 2020-08-24 NOTE — Telephone Encounter (Signed)
Pt advised.   Thanks,   -Shirle Provencal  

## 2020-08-24 NOTE — Telephone Encounter (Signed)
-----   Message from Mar Daring, [place] sent at 08/24/2020 10:52 AM EDT ----- Normal mammogram. Repeat screening in one year.

## 2020-09-01 DIAGNOSIS — K5900 Constipation, unspecified: Secondary | ICD-10-CM | POA: Diagnosis not present

## 2020-09-01 DIAGNOSIS — K219 Gastro-esophageal reflux disease without esophagitis: Secondary | ICD-10-CM | POA: Diagnosis not present

## 2020-09-01 DIAGNOSIS — Z8719 Personal history of other diseases of the digestive system: Secondary | ICD-10-CM | POA: Diagnosis not present

## 2020-09-01 DIAGNOSIS — G35 Multiple sclerosis: Secondary | ICD-10-CM | POA: Diagnosis not present

## 2020-09-01 DIAGNOSIS — Z86010 Personal history of colonic polyps: Secondary | ICD-10-CM | POA: Diagnosis not present

## 2020-09-04 ENCOUNTER — Other Ambulatory Visit: Payer: Self-pay | Admitting: Physician Assistant

## 2020-09-04 DIAGNOSIS — E039 Hypothyroidism, unspecified: Secondary | ICD-10-CM

## 2020-09-04 NOTE — Telephone Encounter (Signed)
Requested Prescriptions  Pending Prescriptions Disp Refills  . levothyroxine (SYNTHROID) 150 MCG tablet [Pharmacy Med Name: LEVOTHYROXINE 150 MCG TABLET] 90 tablet 1    Sig: TAKE 1 TABLET BY MOUTH ONCE DAILY BEFORE BREAKFAST     Endocrinology:  Hypothyroid Agents Failed - 09/04/2020  5:14 PM      Failed - TSH needs to be rechecked within 3 months after an abnormal result. Refill until TSH is due.      Passed - TSH in normal range and within 360 days    TSH  Date Value Ref Range Status  08/02/2020 4.100 0.450 - 4.500 uIU/mL Final         Passed - Valid encounter within last 12 months    Recent Outpatient Visits          1 month ago Annual physical exam   [facility], [place], [place]   3 months ago No-show for appointment   [place], PA-C   5 months ago Contusion of left knee and lower leg, subsequent encounter   [place], [place]   6 months ago Fall, initial encounter   [place], [place]   7 months ago Flank pain   [facility] [place], [place], [place]

## 2020-09-09 ENCOUNTER — Other Ambulatory Visit: Payer: Self-pay | Admitting: Physician Assistant

## 2020-09-09 DIAGNOSIS — M549 Dorsalgia, unspecified: Secondary | ICD-10-CM

## 2020-10-05 DIAGNOSIS — Z23 Encounter for immunization: Secondary | ICD-10-CM | POA: Diagnosis not present

## 2020-10-05 DIAGNOSIS — D72810 Lymphocytopenia: Secondary | ICD-10-CM | POA: Diagnosis not present

## 2020-10-05 DIAGNOSIS — G35 Multiple sclerosis: Secondary | ICD-10-CM | POA: Diagnosis not present

## 2020-10-05 DIAGNOSIS — R252 Cramp and spasm: Secondary | ICD-10-CM | POA: Diagnosis not present

## 2020-10-05 DIAGNOSIS — Z79899 Other long term (current) drug therapy: Secondary | ICD-10-CM | POA: Diagnosis not present

## 2020-10-05 DIAGNOSIS — Z111 Encounter for screening for respiratory tuberculosis: Secondary | ICD-10-CM | POA: Diagnosis not present

## 2020-10-21 ENCOUNTER — Encounter: Payer: Self-pay | Admitting: Physician Assistant

## 2020-10-21 ENCOUNTER — Ambulatory Visit: Payer: BC Managed Care – PPO | Admitting: Physician Assistant

## 2020-10-21 ENCOUNTER — Other Ambulatory Visit: Payer: Self-pay

## 2020-10-21 VITALS — BP 133/85 | HR 83 | Temp 98.2°F | Resp 16 | Wt 328.2 lb

## 2020-10-21 DIAGNOSIS — M7918 Myalgia, other site: Secondary | ICD-10-CM

## 2020-10-21 MED ORDER — KETOROLAC TROMETHAMINE 60 MG/2ML IM SOLN
60.0000 mg | Freq: Once | INTRAMUSCULAR | Status: AC
Start: 2020-10-21 — End: 2020-10-21
  Administered 2020-10-21: 60 mg via INTRAMUSCULAR

## 2020-10-21 MED ORDER — CYCLOBENZAPRINE HCL 5 MG PO TABS: 5.0000 mg | ORAL_TABLET | Freq: Three times a day (TID) | ORAL | 1 refills | Status: DC | PRN

## 2020-10-21 MED ORDER — PREDNISONE 10 MG (21) PO TBPK: ORAL_TABLET | ORAL | 0 refills | Status: DC

## 2020-10-21 NOTE — Progress Notes (Signed)
Established patient visit   Patient: Patricia Deleon   DOB: 1961/08/14   59 y.o. Female  MRN: [id number] Visit Date: 10/21/2020  Today's healthcare provider: Mar Daring, PA-C   Chief Complaint  Patient presents with   Back Pain   Subjective    Back Pain This is a new problem. The current episode started more than 1 month ago. The problem occurs constantly. The problem is unchanged. The quality of the pain is described as aching and shooting (when she rolls over in bed is a shooting pain). The pain does not radiate. The pain is at a severity of 4/10. The pain is moderate. The pain is worse during the night. The symptoms are aggravated by position. Pertinent negatives include no leg pain, numbness, tingling or weakness. She has tried heat and ice (baclofen) for the symptoms. The treatment provided no relief.    Medications: Outpatient Medications Prior to Visit  Medication Sig   amLODipine (NORVASC) 5 MG tablet TAKE 1 TABLET BY MOUTH EVERY DAY   ARIPiprazole (ABILIFY) 10 MG tablet Take 10 mg by mouth daily.   Armodafinil (NUVIGIL) 200 MG TABS One po qAM (Patient taking differently: Take 250 mg by mouth daily as needed. One po qAM)   aspirin 81 MG chewable tablet Chew 81 mg by mouth daily.   baclofen (LIORESAL) 10 MG tablet Take a half tablet to 2 tablets by mouth at bedtime for nighttime spasticity   Cholecalciferol (VITAMIN D3) 2000 UNITS capsule Take 4,000 Units by mouth daily.    Ferrous Sulfate Dried (HM SLOW RELEASE IRON) 45 MG TBCR Take by mouth.   gabapentin (NEURONTIN) 100 MG capsule TAKE 1 CAPSULE BY MOUTH TWICE A DAY   Glucosamine Sulfate-MSM (MSM/GLUCOSAMINE) 250-250 MG CAPS Take 1 tablet by mouth 2 (two) times daily.    levothyroxine (SYNTHROID) 150 MCG tablet TAKE 1 TABLET BY MOUTH ONCE DAILY BEFORE BREAKFAST   Omega-3 Fatty Acids (FISH OIL) 500 MG CAPS Take by mouth.   pantoprazole (PROTONIX) 40 MG tablet Take 1 tablet (40 mg total) by mouth  2 (two) times daily before a meal.   polyethylene glycol powder (MIRALAX) powder Take 17 g by mouth at bedtime.    simvastatin (ZOCOR) 20 MG tablet Take 1 tablet (20 mg total) by mouth at bedtime.   TECFIDERA 240 MG CPDR Take 1 capsule (240 mg total) by mouth 2 (two) times daily. Call [phone] to make appt for future refills   traZODone (DESYREL) 50 MG tablet    venlafaxine XR (EFFEXOR-XR) 37.5 MG 24 hr capsule    vitamin C (ASCORBIC ACID) 500 MG tablet Take 500 mg by mouth daily.   ferrous sulfate 325 (65 FE) MG tablet Take 325 mg by mouth every evening.    ketoconazole (NIZORAL) 2 % cream Apply 1 application topically daily.   sucralfate (CARAFATE) 1 g tablet Take 1 tablet (1 g total) by mouth 4 (four) times daily -  with meals and at bedtime.   No facility-administered medications prior to visit.    Review of Systems  Constitutional: Negative.   Respiratory: Negative.   Cardiovascular: Negative.   Musculoskeletal: Positive for back pain.  Neurological: Negative for tingling, weakness and numbness.       Objective    BP 133/85 (BP Location: Left Arm, Patient Position: Sitting, Cuff Size: Large)    Pulse 83    Temp 98.2 F (36.8 C) (Oral)    Resp 16    Wt (!) 328 lb  3.2 oz (148.9 kg)    LMP 08/06/2018    BMI 52.97 kg/m     Physical Exam Vitals reviewed.  Constitutional:      General: She is not in acute distress.    Appearance: Normal appearance. She is well-developed. She is obese. She is not ill-appearing.  HENT:     Head: Normocephalic and atraumatic.  Pulmonary:     Effort: Pulmonary effort is normal. No respiratory distress.  Musculoskeletal:     Cervical back: Normal range of motion and neck supple.     Thoracic back: Spasms and tenderness present. No bony tenderness. Decreased range of motion.       Back:  Neurological:     Mental Status: She is alert.  Psychiatric:        Behavior: Behavior normal.        Thought Content: Thought content normal.         Judgment: Judgment normal.     No results found for any visits on 10/21/20.  Assessment & Plan     1. Rhomboid muscle pain Suspect rhomboid strain with some trigger points. Will treat with Toradol IM today. Flexeril given for muscle spasms. Start prednisone taper tomorrow. Moist heat. Exercises and stretches printed for patient. Call if worsening.  - cyclobenzaprine (FLEXERIL) 5 MG tablet; Take 1 tablet (5 mg total) by mouth 3 (three) times daily as needed for muscle spasms.  Dispense: 30 tablet; Refill: 1 - predniSONE (STERAPRED UNI-PAK 21 TAB) 10 MG (21) TBPK tablet; 6 day taper; take as directed on package instructions  Dispense: 21 tablet; Refill: 0 - ketorolac (TORADOL) injection 60 mg   No follow-ups on file.      Reynolds Bowl, PA-C, have reviewed all documentation for this visit. The documentation on 11/01/20 for the exam, diagnosis, procedures, and orders are all accurate and complete.   Rubye Beach  [facility] [phone] (phone) [phone] (fax)  [place]

## 2020-11-01 ENCOUNTER — Encounter: Payer: Self-pay | Admitting: Physician Assistant

## 2020-11-20 ENCOUNTER — Other Ambulatory Visit: Payer: Self-pay | Admitting: Physician Assistant

## 2020-11-20 DIAGNOSIS — M5431 Sciatica, right side: Secondary | ICD-10-CM

## 2020-11-20 NOTE — Telephone Encounter (Signed)
Requested medication (s) are due for refill today:  Yes  Requested medication (s) are on the active medication list:  Yes  Future visit scheduled:  No  Last Refill: 03/29/20; #180; no refills  Notes to clinic: has not had eval. for Sciatica right side for a long time; please advise  Requested Prescriptions  Pending Prescriptions Disp Refills   gabapentin (NEURONTIN) 100 MG capsule [Pharmacy Med Name: GABAPENTIN 100 MG CAPSULE] 180 capsule 0    Sig: TAKE 1 [place]      Neurology: Anticonvulsants - gabapentin Passed - 11/20/2020  2:15 PM      Passed - Valid encounter within last 12 months    Recent Outpatient Visits           1 month ago Rhomboid muscle pain   [facility] [place], [place], [place]   3 months ago Annual physical exam   [facility] [place], [place], [place]   6 months ago No-show for appointment   [place], PA-C   7 months ago Contusion of left knee and lower leg, subsequent encounter   [facility] [place], [place]   9 months ago Fall, initial encounter   [facility], [place], [place]

## 2020-11-26 ENCOUNTER — Encounter: Payer: Self-pay | Admitting: Physician Assistant

## 2020-11-26 DIAGNOSIS — G35D Multiple sclerosis, unspecified: Secondary | ICD-10-CM

## 2020-11-26 DIAGNOSIS — G35 Multiple sclerosis: Secondary | ICD-10-CM

## 2020-11-26 DIAGNOSIS — G4719 Other hypersomnia: Secondary | ICD-10-CM

## 2020-11-26 DIAGNOSIS — G4733 Obstructive sleep apnea (adult) (pediatric): Secondary | ICD-10-CM

## 2020-11-28 MED ORDER — ARMODAFINIL 200 MG PO TABS: ORAL_TABLET | ORAL | 5 refills | Status: DC

## 2020-12-20 NOTE — Progress Notes (Signed)
Established patient visit   Patient: Patricia Deleon   DOB: 26-Jul-1961   59 y.o. Female  MRN: [id number] Visit Date: 12/21/2020  Today's healthcare provider: Marcille Buffy, FNP   Chief Complaint  Patient presents with   Rash   Subjective    Rash This is a new problem. The current episode started in the past 7 days. The affected locations include the abdomen, left hand and right lower leg. The rash is characterized by burning, redness, pain, swelling and draining. She was exposed to nothing. Pertinent negatives include no anorexia, congestion, cough, diarrhea, eye pain, facial edema, fatigue, fever, joint pain, nail changes, rhinorrhea, shortness of breath, sore throat or vomiting. Treatments tried: Lotrimin. The treatment provided no relief.   Left hand pointer finger had spot on it she had a paper cut and reports it swelled up and drained.   Patient  denies any fever,,chills, rash, chest pain, shortness of breath, nausea, vomiting, or diarrhea.   Denies dizziness, lightheadedness, pre syncopal or syncopal episodes.       Medications: Outpatient Medications Prior to Visit  Medication Sig   amLODipine (NORVASC) 5 MG tablet TAKE 1 TABLET BY MOUTH EVERY DAY   ARIPiprazole (ABILIFY) 10 MG tablet Take 10 mg by mouth daily.   Armodafinil (NUVIGIL) 200 MG TABS One po qAM   aspirin 81 MG chewable tablet Chew 81 mg by mouth daily.   baclofen (LIORESAL) 10 MG tablet Take a half tablet to 2 tablets by mouth at bedtime for nighttime spasticity   Cholecalciferol (VITAMIN D3) 2000 UNITS capsule Take 4,000 Units by mouth daily.    Ferrous Sulfate Dried (HM SLOW RELEASE IRON) 45 MG TBCR Take by mouth.   gabapentin (NEURONTIN) 100 MG capsule TAKE 1 CAPSULE BY MOUTH TWICE A DAY   Glucosamine Sulfate-MSM (MSM/GLUCOSAMINE) 250-250 MG CAPS Take 1 tablet by mouth 2 (two) times daily.    levothyroxine (SYNTHROID) 150 MCG tablet TAKE 1 TABLET BY MOUTH ONCE DAILY BEFORE  BREAKFAST   Omega-3 Fatty Acids (FISH OIL) 500 MG CAPS Take by mouth.   pantoprazole (PROTONIX) 40 MG tablet Take 1 tablet (40 mg total) by mouth 2 (two) times daily before a meal.   polyethylene glycol powder (GLYCOLAX/MIRALAX) 17 GM/SCOOP powder Take 17 g by mouth at bedtime.    predniSONE (STERAPRED UNI-PAK 21 TAB) 10 MG (21) TBPK tablet 6 day taper; take as directed on package instructions   simvastatin (ZOCOR) 20 MG tablet Take 1 tablet (20 mg total) by mouth at bedtime.   traZODone (DESYREL) 50 MG tablet    venlafaxine XR (EFFEXOR-XR) 37.5 MG 24 hr capsule    vitamin C (ASCORBIC ACID) 500 MG tablet Take 500 mg by mouth daily.   cyclobenzaprine (FLEXERIL) 5 MG tablet Take 1 tablet (5 mg total) by mouth 3 (three) times daily as needed for muscle spasms. (Patient not taking: Reported on 12/21/2020)   TECFIDERA 240 MG CPDR Take 1 capsule (240 mg total) by mouth 2 (two) times daily. Call [phone] to make appt for future refills (Patient not taking: Reported on 12/21/2020)   No facility-administered medications prior to visit.    Review of Systems  Constitutional: Negative for fatigue and fever.  HENT: Negative for congestion, rhinorrhea and sore throat.   Eyes: Negative for pain.  Respiratory: Negative for cough and shortness of breath.   Gastrointestinal: Negative for anorexia, diarrhea and vomiting.  Genitourinary: Positive for vaginal bleeding.  Musculoskeletal: Negative for joint pain.  Skin: Positive  for rash. Negative for nail changes.      Objective    BP (!) 148/72    Pulse 91    Temp 98.9 F (37.2 C) (Oral)    Resp 16    Wt (!) 330 lb 3.2 oz (149.8 kg)    LMP 08/06/2018    BMI 53.30 kg/m    Physical Exam Constitutional:      General: She is not in acute distress.    Appearance: She is obese. She is not ill-appearing, toxic-appearing or diaphoretic.  HENT:     Head: Normocephalic and atraumatic.     Right Ear: External ear normal.     Left Ear: External  ear normal.     Nose: Nose normal. No congestion or rhinorrhea.     Mouth/Throat:     Pharynx: Oropharynx is clear.  Eyes:     General: No scleral icterus.    Conjunctiva/sclera: Conjunctivae normal.     Pupils: Pupils are equal, round, and reactive to light.  Cardiovascular:     Rate and Rhythm: Normal rate and regular rhythm.     Pulses: Normal pulses.     Heart sounds: Normal heart sounds. No murmur heard. No friction rub. No gallop.   Pulmonary:     Effort: Pulmonary effort is normal. No respiratory distress.     Breath sounds: Normal breath sounds. No stridor. No wheezing, rhonchi or rales.  Chest:     Chest wall: No tenderness.  Musculoskeletal:        General: Normal range of motion.  Skin:    General: Skin is moist.     Findings: Abscess, erythema and rash present. Rash is macular.       Neurological:     General: No focal deficit present.     Mental Status: She is alert.     GCS: GCS eye subscore is 4. GCS verbal subscore is 5. GCS motor subscore is 6.     Cranial Nerves: Cranial nerves are intact.     Sensory: Sensation is intact.     Motor: Motor function is intact.     Coordination: Coordination is intact.     Gait: Gait is intact.  Psychiatric:        Attention and Perception: Attention and perception normal.        Mood and Affect: Mood and affect normal.        Speech: Speech normal.        Behavior: Behavior normal. Behavior is cooperative.        Thought Content: Thought content normal.        Cognition and Memory: Cognition normal.        Judgment: Judgment normal.      Results for orders placed or performed in visit on 12/21/20  CBC with Differential/Platelet  Result Value Ref Range   WBC 4.5 3.4 - 10.8 x10E3/uL   RBC 5.00 3.77 - 5.28 x10E6/uL   Hemoglobin 12.6 11.1 - 15.9 g/dL   Hematocrit 39.8 34.0 - 46.6 %   MCV 80 79 - 97 fL   MCH 25.2 (L) 26.6 - 33.0 pg   MCHC 31.7 31.5 - 35.7 g/dL   RDW 14.7 11.7 - 15.4 %   Platelets 188 150 - 450  x10E3/uL   Neutrophils 87 Not Estab. %   Lymphs 7 Not Estab. %   Monocytes 4 Not Estab. %   Eos 1 Not Estab. %   Basos 1 Not Estab. %   Neutrophils Absolute  3.9 1.4 - 7.0 x10E3/uL   Lymphocytes Absolute 0.3 (L) 0.7 - 3.1 x10E3/uL   Monocytes Absolute 0.2 0.1 - 0.9 x10E3/uL   EOS (ABSOLUTE) 0.1 0.0 - 0.4 x10E3/uL   Basophils Absolute 0.0 0.0 - 0.2 x10E3/uL   Immature Granulocytes 0 Not Estab. %   Immature Grans (Abs) 0.0 0.0 - 0.1 x10E3/uL    Assessment & Plan     Skin infection - Plan: CBC with Differential/Platelet  Fungal infection - Plan: CBC with Differential/Platelet  Cutaneous abscess of right lower extremity  Meds ordered this encounter  Medications   cephALEXin (KEFLEX) 500 MG capsule    Sig: Take 1 capsule (500 mg total) by mouth 3 (three) times daily.    Dispense:  30 capsule    Refill:  0   ketoconazole (NIZORAL) 2 % cream    Sig: Apply 1 application topically daily.    Dispense:  15 g    Refill:  0   ketoconazole (NIZORAL) 2 % shampoo    Sig: Apply 1 application topically 2 (two) times a week.    Dispense:  120 mL    Refill:  0  She will use Nizoral cream under skin fold, and also ok to apply antifungal powder. Recommend that she start ketoconazole shampoo, lather and leave on 5 to 10 minutes once to twice a week for prevention, of yeast given the extreme yeast and skin folds sweating.  She will not start shampoo until area is clear.   Low risk for MRSA and no history, do not feel I & D is needed on right lower leg abcess  At this time.   Will recheck in one week. Red Flags discussed. The patient was given clear instructions to go to ER or return to medical center if any red flags develop, symptoms do not improve, worsen or new problems develop. They verbalized understanding.   Return in about 1 week (around 12/28/2020), or if symptoms worsen or fail to improve, for at any time for any worsening symptoms, Go to Emergency room/ urgent care if worse.       Addressed acute and or chronic medical problems today requiring 30 minutes reviewing patients medical record,labs, counseling patient regarding patient's conditions, any medications, answering questions regarding health, and coordination of care as needed. After visit summary patient given copy and reviewed.    Marcille Buffy, [place] [phone] (phone) [phone] (fax)  [place]

## 2020-12-21 ENCOUNTER — Ambulatory Visit (INDEPENDENT_AMBULATORY_CARE_PROVIDER_SITE_OTHER): Payer: BC Managed Care – PPO | Admitting: Adult Health

## 2020-12-21 ENCOUNTER — Other Ambulatory Visit: Payer: Self-pay

## 2020-12-21 VITALS — BP 148/72 | HR 91 | Temp 98.9°F | Resp 16 | Wt 330.2 lb

## 2020-12-21 DIAGNOSIS — L02415 Cutaneous abscess of right lower limb: Secondary | ICD-10-CM | POA: Diagnosis not present

## 2020-12-21 DIAGNOSIS — B49 Unspecified mycosis: Secondary | ICD-10-CM | POA: Diagnosis not present

## 2020-12-21 DIAGNOSIS — L089 Local infection of the skin and subcutaneous tissue, unspecified: Secondary | ICD-10-CM

## 2020-12-21 MED ORDER — CEPHALEXIN 500 MG PO CAPS: 500.0000 mg | ORAL_CAPSULE | Freq: Three times a day (TID) | ORAL | 0 refills | Status: DC

## 2020-12-21 MED ORDER — KETOCONAZOLE 2 % EX SHAM: 1.0000 "application " | MEDICATED_SHAMPOO | CUTANEOUS | 0 refills | Status: DC

## 2020-12-21 MED ORDER — KETOCONAZOLE 2 % EX CREA: 1.0000 "application " | TOPICAL_CREAM | Freq: Every day | CUTANEOUS | 0 refills | Status: DC

## 2020-12-21 NOTE — Patient Instructions (Signed)
Skin Yeast Infection  A skin yeast infection is a condition in which there is an overgrowth of yeast (candida) that normally lives on the skin. This condition usually occurs in areas of the skin that are constantly warm and moist, such as the armpits or the groin. What are the causes? This condition is caused by a change in the normal balance of the yeast and bacteria that live on the skin. What increases the risk? You are more likely to develop this condition if you:  Are obese.  Are pregnant.  Take birth control pills.  Have diabetes.  Take antibiotic medicines.  Take steroid medicines.  Are malnourished.  Have a weak body defense system (immune system).  Are 45 years of age or older.  Wear tight clothing. What are the signs or symptoms? The most common symptom of this condition is itchiness in the affected area. Other symptoms include:  Red, swollen area of the skin.  Bumps on the skin. How is this diagnosed?  This condition is diagnosed with a medical history and physical exam.  Your health care provider may check for yeast by taking light scrapings of the skin to be viewed under a microscope. How is this treated? This condition is treated with medicine. Medicines may be prescribed or be available over the counter. The medicines may be:  Taken by mouth (orally).  Applied as a cream or powder to your skin. Follow these instructions at home:   Take or apply over-the-counter and prescription medicines only as told by your health care provider.  Maintain a healthy weight. If you need help losing weight, talk with your health care provider.  Keep your skin clean and dry.  If you have diabetes, keep your blood sugar under control.  Keep all follow-up visits as told by your health care provider. This is important. Contact a health care provider if:  Your symptoms go away and then return.  Your symptoms do not get better with treatment.  Your symptoms get  worse.  Your rash spreads.  You have a fever or chills.  You have new symptoms.  You have new warmth or redness of your skin. Summary  A skin yeast infection is a condition in which there is an overgrowth of yeast (candida) that normally lives on the skin. This condition is caused by a change in the normal balance of the yeast and bacteria that live on the skin.  Take or apply over-the-counter and prescription medicines only as told by your health care provider.  Keep your skin clean and dry.  Contact a health care provider if your symptoms do not get better with treatment. This information is not intended to replace advice given to you by your health care provider. Make sure you discuss any questions you have with your health care provider. Document Revised: 05/06/2018 Document Reviewed: 05/06/2018 Elsevier Patient Education  [place]. Ketoconazole shampoo What is this medicine? KETOCONAZOLE (kee toe KON na zole) is an antifungal medicine. It is used to treat certain kinds of fungal or yeast infections. This medicine may be used for other purposes; ask your health care provider or pharmacist if you have questions. COMMON BRAND NAME(S): Nizoral, Nizoral A-D What should I tell my health care provider before I take this medicine? They need to know if you have any of these conditions:  an unusual or allergic reaction to ketoconazole, itraconazole, miconazole, sulfites, other foods, dyes or preservatives  pregnant or trying to get pregnant  breast-feeding How  should I use this medicine? This medicine is for external use only. Follow the directions on the prescription label. Wash your hands before and after use. Apply the shampoo to damp skin of the affected area of the skin or scalp. Use enough shampoo to cover the affected area and a wide margin surrounding the affected area. Work the shampoo into a Public relations account executive and leave on for 5 minutes. Rinse the treated area completely with  water. Do not get the shampoo in your eyes. If you do, rinse out with plenty of cool tap water. Finish the full course prescribed by your doctor or health care professional even if you think your condition is better. Do not stop using except on the advice of your doctor or health care professional. Talk to your pediatrician regarding the use of this medicine in children. Special care may be needed. Overdosage: If you think you have taken too much of this medicine contact a poison control center or emergency room at once. NOTE: This medicine is only for you. Do not share this medicine with others. What if I miss a dose? If you miss a dose, use it as soon as you can. If it is almost time for your next dose, use only that dose. Do not use double or extra doses. What may interact with this medicine? Interactions are not expected. Do not use any other skin products without telling your doctor or health care professional. This list may not describe all possible interactions. Give your health care provider a list of all the medicines, herbs, non-prescription drugs, or dietary supplements you use. Also tell them if you smoke, drink alcohol, or use illegal drugs. Some items may interact with your medicine. What should I watch for while using this medicine? Tell your doctor or health care professional if your symptoms do not begin to improve in 1 to 2 weeks. If your hair has been permanently waved the shampoo may remove the curl. What side effects may I notice from receiving this medicine? Side effects that you should report to your doctor or health care professional as soon as possible:  allergic reactions like skin rash, itching or hives, swelling of the face, lips, or tongue  pain, tingling, numbness Side effects that usually do not require medical attention (report to your doctor or health care professional if they continue or are bothersome):  dry skin  hair loss, hair discoloration or abnormal  texture  skin irritation This list may not describe all possible side effects. Call your doctor for medical advice about side effects. You may report side effects to FDA at 1-800-FDA-1088. Where should I keep my medicine? Keep out of the reach of children. Store at room temperature between 20 to 25 degrees C (68 to 77 degrees F). Throw away any unused medicine after the expiration date. NOTE: This sheet is a summary. It may not cover all possible information. If you have questions about this medicine, talk to your doctor, pharmacist, or health care provider.  2020 Elsevier/Gold Standard (2019-04-21 15:38:47) Cephalexin Tablets or Capsules What is this medicine? CEPHALEXIN (sef a LEX in) is a cephalosporin antibiotic. It treats some infections caused by bacteria. It will not work for colds, the flu, or other viruses. This medicine may be used for other purposes; ask your health care provider or pharmacist if you have questions. COMMON BRAND NAME(S): Biocef, Daxbia, Keflex, Keftab What should I tell my health care provider before I take this medicine? They need to know if you have  any of these conditions:  kidney disease  stomach or intestine problems, especially colitis  an unusual or allergic reaction to cephalexin, other cephalosporins, penicillins, other antibiotics, medicines, foods, dyes or preservatives  pregnant or trying to get pregnant  breast-feeding How should I use this medicine? Take this drug by mouth. Take it as directed on the prescription label at the same time every day. You can take it with or without food. If it upsets your stomach, take it with food. Take all of this drug unless your health care provider tells you to stop it early. Keep taking it even if you think you are better. Talk to your health care provider about the use of this drug in children. While it may be prescribed for selected conditions, precautions do apply. Overdosage: If you think you have taken  too much of this medicine contact a poison control center or emergency room at once. NOTE: This medicine is only for you. Do not share this medicine with others. What if I miss a dose? If you miss a dose, take it as soon as you can. If it is almost time for your next dose, take only that dose. Do not take double or extra doses. What may interact with this medicine?  probenecid  some other antibiotics This list may not describe all possible interactions. Give your health care provider a list of all the medicines, herbs, non-prescription drugs, or dietary supplements you use. Also tell them if you smoke, drink alcohol, or use illegal drugs. Some items may interact with your medicine. What should I watch for while using this medicine? Tell your doctor or health care provider if your symptoms do not begin to improve in a few days. This medicine may cause serious skin reactions. They can happen weeks to months after starting the medicine. Contact your health care provider right away if you notice fevers or flu-like symptoms with a rash. The rash may be red or purple and then turn into blisters or peeling of the skin. Or, you might notice a red rash with swelling of the face, lips or lymph nodes in your neck or under your arms. Do not treat diarrhea with over the counter products. Contact your doctor if you have diarrhea that lasts more than 2 days or if it is severe and watery. If you have diabetes, you may get a false-positive result for sugar in your urine. Check with your doctor or health care provider. What side effects may I notice from receiving this medicine? Side effects that you should report to your doctor or health care professional as soon as possible:  allergic reactions like skin rash, itching or hives, swelling of the face, lips, or tongue  breathing problems  pain or trouble passing urine  redness, blistering, peeling or loosening of the skin, including inside the mouth  severe  or watery diarrhea  unusually weak or tired  yellowing of the eyes, skin Side effects that usually do not require medical attention (report to your doctor or health care professional if they continue or are bothersome):  gas or heartburn  genital or anal irritation  headache  joint or muscle pain  nausea, vomiting This list may not describe all possible side effects. Call your doctor for medical advice about side effects. You may report side effects to FDA at 1-800-FDA-1088. Where should I keep my medicine? Keep out of the reach of children and pets. Store at room temperature between 20 and 25 degrees C (68 and 77  degrees F). Throw away any unused drug after the expiration date. NOTE: This sheet is a summary. It may not cover all possible information. If you have questions about this medicine, talk to your doctor, pharmacist, or health care provider.  2020 Elsevier/Gold Standard (2019-07-24 11:27:00) Intertrigo Intertrigo is skin irritation (inflammation) that happens in warm, moist areas of the body. The irritation can cause a rash and make skin raw and itchy. The rash is usually pink or red. It happens mostly between folds of skin or where skin rubs together, such as:  Between the toes.  In the armpits.  In the groin area.  Under the belly.  Under the breasts.  Around the butt area. This condition is not passed from person to person (is not contagious). What are the causes?  Heat, moisture, rubbing, and not enough air movement.  The condition can be made worse by: ? Sweat. ? Bacteria. ? A fungus, such as yeast. What increases the risk?  Moisture in your skin folds.  You are more likely to develop this condition if you: ? Have diabetes. ? Are overweight. ? Are not able to move around. ? Live in a warm and moist climate. ? Wear splints, braces, or other medical devices. ? Are not able to control your pee (urine) or poop (stool). What are the signs or  symptoms?  A pink or red skin rash in the skin fold or near the skin fold.  Raw or scaly skin.  Itching.  A burning feeling.  Bleeding.  Leaking fluid.  A bad smell. How is this treated?  Cleaning and drying your skin.  Taking an antibiotic medicine or using an antibiotic skin cream for a bacterial infection.  Using an antifungal cream on your skin or taking pills for an infection that was caused by a fungus, such as yeast.  Using a steroid ointment to stop the itching and irritation.  Separating the skin fold with a clean cotton cloth to absorb moisture and allow air to flow into the area. Follow these instructions at home:  Keep the affected area clean and dry.  Do not scratch your skin.  Stay cool as much as you can. Use an air conditioner or a fan, if you have one.  Apply over-the-counter and prescription medicines only as told by your doctor.  If you were prescribed an antibiotic medicine, use it as told by your doctor. Do not stop using the antibiotic even if your condition starts to get better.  Keep all follow-up visits as told by your doctor. This is important. How is this prevented?   Stay at a healthy weight.  Take care of your feet. This is very important if you have diabetes. You should: ? Wear shoes that fit well. ? Keep your feet dry. ? Wear clean cotton or wool socks.  Protect the skin in your groin and butt area as told by your doctor. To do this: ? Follow a regular cleaning routine. ? Use creams, powders, or ointments that protect your skin. ? Change protection pads often.  Do not wear tight clothes. Wear clothes that: ? Are loose. ? Take moisture away from your body. ? Are made of cotton.  Wear a bra that gives good support, if needed.  Shower and dry yourself well after being active. Use a hair dryer on a cool setting to dry between skin folds.  Keep your blood sugar under control if you have diabetes. Contact a doctor if:  Your  symptoms do  not get better with treatment.  Your symptoms get worse or they spread.  You notice more redness and warmth.  You have a fever. Summary  Intertrigo is skin irritation that occurs when folds of skin rub together.  This condition is caused by heat, moisture, and rubbing.  This condition may be treated by cleaning and drying your skin and with medicines.  Apply over-the-counter and prescription medicines only as told by your doctor.  Keep all follow-up visits as told by your doctor. This is important. This information is not intended to replace advice given to you by your health care provider. Make sure you discuss any questions you have with your health care provider. Document Revised: 09/25/2018 Document Reviewed: 09/25/2018 Elsevier Patient Education  2020 [employer].

## 2020-12-22 LAB — CBC WITH DIFFERENTIAL/PLATELET
Basophils Absolute: 0 10*3/uL (ref 0.0–0.2)
Basos: 1 %
EOS (ABSOLUTE): 0.1 10*3/uL (ref 0.0–0.4)
Eos: 1 %
Hematocrit: 39.8 % (ref 34.0–46.6)
Hemoglobin: 12.6 g/dL (ref 11.1–15.9)
Immature Grans (Abs): 0 10*3/uL (ref 0.0–0.1)
Immature Granulocytes: 0 %
Lymphocytes Absolute: 0.3 10*3/uL — ABNORMAL LOW (ref 0.7–3.1)
Lymphs: 7 %
MCH: 25.2 pg — ABNORMAL LOW (ref 26.6–33.0)
MCHC: 31.7 g/dL (ref 31.5–35.7)
MCV: 80 fL (ref 79–97)
Monocytes Absolute: 0.2 10*3/uL (ref 0.1–0.9)
Monocytes: 4 %
Neutrophils Absolute: 3.9 10*3/uL (ref 1.4–7.0)
Neutrophils: 87 %
Platelets: 188 10*3/uL (ref 150–450)
RBC: 5 x10E6/uL (ref 3.77–5.28)
RDW: 14.7 % (ref 11.7–15.4)
WBC: 4.5 10*3/uL (ref 3.4–10.8)

## 2020-12-25 ENCOUNTER — Encounter: Payer: Self-pay | Admitting: Adult Health

## 2020-12-25 DIAGNOSIS — L02415 Cutaneous abscess of right lower limb: Secondary | ICD-10-CM | POA: Insufficient documentation

## 2020-12-25 DIAGNOSIS — B49 Unspecified mycosis: Secondary | ICD-10-CM | POA: Insufficient documentation

## 2020-12-25 DIAGNOSIS — L089 Local infection of the skin and subcutaneous tissue, unspecified: Secondary | ICD-10-CM | POA: Insufficient documentation

## 2020-12-29 ENCOUNTER — Ambulatory Visit: Payer: Self-pay | Admitting: Adult Health

## 2021-01-03 ENCOUNTER — Ambulatory Visit: Payer: Self-pay | Admitting: *Deleted

## 2021-01-03 NOTE — Telephone Encounter (Signed)
Patient is returning call to office- she reports she made appointment because she has had weight gain and is concerned about new swelling in her feet and hands. Patient states her left sided swelling is greater than right sided swelling. Patient states it does get better after sleeping - but comes back when up during the day. Not sure of appointment type scheduling call- so she will try to answer call back today- she is at work. Reason for Disposition . [1] MODERATE leg swelling (e.g., swelling extends up to knees) AND [2] new-onset or worsening  Answer Assessment - Initial Assessment Questions 1. ONSET: "When did the swelling start?" (e.g., minutes, hours, days)     Over 1 week 2. LOCATION: "What part of the leg is swollen?"  "Are both legs swollen or just one leg?"     Both feet- mostly the left 3. SEVERITY: "How bad is the swelling?" (e.g., localized; mild, moderate, severe)  - Localized - small area of swelling localized to one leg  - MILD pedal edema - swelling limited to foot and ankle, pitting edema < 1/4 inch (6 mm) deep, rest and elevation eliminate most or all swelling  - MODERATE edema - swelling of lower leg to knee, pitting edema > 1/4 inch (6 mm) deep, rest and elevation only partially reduce swelling  - SEVERE edema - swelling extends above knee, facial or hand swelling present      mild 4. REDNESS: "Does the swelling look red or infected?"     Feet turn a little red 5. PAIN: "Is the swelling painful to touch?" If Yes, ask: "How painful is it?"   (Scale 1-10; mild, moderate or severe)     Yes- hard to bend ankles 6. FEVER: "Do you have a fever?" If Yes, ask: "What is it, how was it measured, and when did it start?"      no 7. CAUSE: "What do you think is causing the leg swelling?"     Fluid retention 8. MEDICAL HISTORY: "Do you have a history of heart failure, kidney disease, liver failure, or cancer?"     no 9. RECURRENT SYMPTOM: "Have you had leg swelling before?" If Yes,  ask: "When was the last time?" "What happened that time?"     No- not this bad 10. OTHER SYMPTOMS: "Do you have any other symptoms?" (e.g., chest pain, difficulty breathing)       No- patient is concerned about weight- she does have hand swelling 11. PREGNANCY: "Is there any chance you are pregnant?" "When was your last menstrual period?"       n/a  Protocols used: LEG SWELLING AND EDEMA-A-AH

## 2021-01-04 ENCOUNTER — Encounter: Payer: Self-pay | Admitting: Physician Assistant

## 2021-01-04 ENCOUNTER — Telehealth (INDEPENDENT_AMBULATORY_CARE_PROVIDER_SITE_OTHER): Payer: BC Managed Care – PPO | Admitting: Physician Assistant

## 2021-01-04 DIAGNOSIS — I10 Essential (primary) hypertension: Secondary | ICD-10-CM | POA: Diagnosis not present

## 2021-01-04 DIAGNOSIS — R7303 Prediabetes: Secondary | ICD-10-CM

## 2021-01-04 DIAGNOSIS — M7989 Other specified soft tissue disorders: Secondary | ICD-10-CM | POA: Diagnosis not present

## 2021-01-04 DIAGNOSIS — Z6841 Body Mass Index (BMI) 40.0 and over, adult: Secondary | ICD-10-CM

## 2021-01-04 MED ORDER — OZEMPIC (0.25 OR 0.5 MG/DOSE) 2 MG/1.5ML ~~LOC~~ SOPN: PEN_INJECTOR | SUBCUTANEOUS | 0 refills | Status: AC

## 2021-01-04 MED ORDER — VENLAFAXINE HCL ER 150 MG PO CP24: 300.0000 mg | ORAL_CAPSULE | Freq: Every day | ORAL | 1 refills | Status: DC

## 2021-01-04 MED ORDER — TRAZODONE HCL 100 MG PO TABS
200.0000 mg | ORAL_TABLET | Freq: Every day | ORAL | 1 refills | Status: DC
Start: 2021-01-04 — End: 2021-07-10

## 2021-01-04 MED ORDER — LOSARTAN POTASSIUM-HCTZ 50-12.5 MG PO TABS: 1.0000 | ORAL_TABLET | Freq: Every day | ORAL | 1 refills | Status: DC

## 2021-01-04 MED ORDER — ARIPIPRAZOLE 10 MG PO TABS: 10.0000 mg | ORAL_TABLET | Freq: Every day | ORAL | 1 refills | Status: DC

## 2021-01-04 NOTE — Progress Notes (Signed)
MyChart Video Visit    Virtual Visit via Video Note   This visit type was conducted due to national recommendations for restrictions regarding the COVID-19 Pandemic (e.g. social distancing) in an effort to limit this patient's exposure and mitigate transmission in our community. This patient is at least at moderate risk for complications without adequate follow up. This format is felt to be most appropriate for this patient at this time. Physical exam was limited by quality of the video and audio technology used for the visit.   Patient location: Home Provider location: Home office  I discussed the limitations of evaluation and management by telemedicine and the availability of in person appointments. The patient expressed understanding and agreed to proceed.  Patient: Patricia Deleon   DOB: August 18, 1961   60 y.o. Female  MRN: [id number] Visit Date: 01/04/2021  Today's healthcare provider: Margaretann Loveless, PA-C   No chief complaint on file.  Subjective    HPI   Patricia Deleon is a 60 yo old morbidly obese female with prediabetes, HTN, and MS that presents today via mychart with bilateral lower extremity edema (L>R). Reports this started about 1 week ago. Has been progressing since. She does report that swelling improves over night, but returns next day.   Also of note, she is currently off her MS medications (Tecfidera) as it was felt this may be causing her lymphocytes to be low. She is scheduled at the end of the month to follow up with her Neurologist for medication management.   She would also like to discuss weight. She is trying to lose weight but struggling with doing this alone. She is limiting her calories and trying to eat healthier. She is limited in exercise due to limited time (work) and arthritic issues.   Patient Active Problem List   Diagnosis Date Noted  . Skin infection 12/25/2020  . Cutaneous abscess of right lower extremity 12/25/2020  . Fungal infection  12/25/2020  . Moderate episode of recurrent major depressive disorder (HCC) 05/28/2019  . Right optic neuritis 08/25/2018  . Excessive daytime sleepiness 08/25/2018  . Multiple sclerosis (HCC) 07/08/2018  . Neck pain 07/08/2018  . Transient vision disturbance of left eye 06/18/2018  . Headache 06/18/2018  . Urge incontinence 06/18/2018  . Numbness 06/18/2018  . Acne rosacea 05/07/2016  . Agoraphobia with panic disorder 09/28/2015  . Clinical depression 09/28/2015  . 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 Acid reflux 09/28/2015  . Displacement of lumbar intervertebral disc without myelopathy 09/28/2015  . Hypercholesteremia 09/28/2015  . Insomnia 09/28/2015  . Thoracic, 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  . Anemia, iron deficiency 09/28/2015  . Adiposity 09/28/2015  . Episodic paroxysmal anxiety disorder 09/28/2015  . Borderline diabetes 09/28/2015  . Restless leg 09/28/2015  . Apnea, sleep 09/28/2015  . Avitaminosis D 09/28/2015  . Hypothyroidism 07/11/2015  . Hypertension 06/09/2015   Past Medical History:  Diagnosis Date  . Agoraphobia with panic disorder   . Anemia   . Depression   . Erosive gastritis   . Fundic gland polyps of stomach, benign   . Geographic tongue   . GERD (gastroesophageal reflux disease)   . History of esophagogastroduodenoscopy (EGD)   . Hypercholesterolemia   . Hypertension   . Intervertebral disc disorder    thoracic,thoracolumber,lumbosacral  . Multiple sclerosis (HCC)   . Obesity   . Sleep apnea       Medications: Outpatient Medications Prior to Visit  Medication Sig  . Armodafinil (NUVIGIL) 200  MG TABS One po qAM  . aspirin 81 MG chewable tablet Chew 81 mg by mouth daily.  . baclofen (LIORESAL) 10 MG tablet Take a half tablet to 2 tablets by mouth at bedtime for nighttime spasticity  . cephALEXin (KEFLEX) 500 MG capsule Take 1 capsule (500 mg total) by mouth 3 (three) times daily.  . Cholecalciferol  (VITAMIN D3) 2000 UNITS capsule Take 4,000 Units by mouth daily.   . cyclobenzaprine (FLEXERIL) 5 MG tablet Take 1 tablet (5 mg total) by mouth 3 (three) times daily as needed for muscle spasms. (Patient not taking: Reported on 12/21/2020)  . Ferrous Sulfate Dried (HM SLOW RELEASE IRON) 45 MG TBCR Take by mouth.  . gabapentin (NEURONTIN) 100 MG capsule TAKE 1 CAPSULE BY MOUTH TWICE A DAY  . Glucosamine Sulfate-MSM (MSM/GLUCOSAMINE) 250-250 MG CAPS Take 1 tablet by mouth 2 (two) times daily.   Marland Kitchen ketoconazole (NIZORAL) 2 % cream Apply 1 application topically daily.  Marland Kitchen ketoconazole (NIZORAL) 2 % shampoo Apply 1 application topically 2 (two) times a week.  . levothyroxine (SYNTHROID) 150 MCG tablet TAKE 1 TABLET BY MOUTH ONCE DAILY BEFORE BREAKFAST  . Omega-3 Fatty Acids (FISH OIL) 500 MG CAPS Take by mouth.  . pantoprazole (PROTONIX) 40 MG tablet Take 1 tablet (40 mg total) by mouth 2 (two) times daily before a meal.  . polyethylene glycol powder (GLYCOLAX/MIRALAX) 17 GM/SCOOP powder Take 17 g by mouth at bedtime.   . predniSONE (STERAPRED UNI-PAK 21 TAB) 10 MG (21) TBPK tablet 6 day taper; take as directed on package instructions  . simvastatin (ZOCOR) 20 MG tablet Take 1 tablet (20 mg total) by mouth at bedtime.  . TECFIDERA 240 MG CPDR Take 1 capsule (240 mg total) by mouth 2 (two) times daily. Call [phone] to make appt for future refills (Patient not taking: Reported on 12/21/2020)  . vitamin C (ASCORBIC ACID) 500 MG tablet Take 500 mg by mouth daily.  . [DISCONTINUED] amLODipine (NORVASC) 5 MG tablet TAKE 1 TABLET BY MOUTH EVERY DAY  . [DISCONTINUED] ARIPiprazole (ABILIFY) 10 MG tablet Take 10 mg by mouth daily.  . [DISCONTINUED] traZODone (DESYREL) 50 MG tablet   . [DISCONTINUED] venlafaxine XR (EFFEXOR-XR) 37.5 MG 24 hr capsule    No facility-administered medications prior to visit.    Review of Systems  Constitutional: Negative.   HENT: Negative.   Respiratory: Negative.    Cardiovascular: Positive for leg swelling. Negative for chest pain and palpitations.  Gastrointestinal: Negative.   Neurological: Negative.     Last CBC Lab Results  Component Value Date   WBC 4.5 12/21/2020   HGB 12.6 12/21/2020   HCT 39.8 12/21/2020   MCV 80 12/21/2020   MCH 25.2 (L) 12/21/2020   RDW 14.7 12/21/2020   PLT 188 [id number]   Last metabolic panel Lab Results  Component Value Date   GLUCOSE 94 08/02/2020   NA 138 08/02/2020   K 4.3 08/02/2020   CL 101 08/02/2020   CO2 22 08/02/2020   BUN 20 08/02/2020   CREATININE 0.95 08/02/2020   GFRNONAA 67 08/02/2020   GFRAA 77 08/02/2020   CALCIUM 9.1 08/02/2020   PROT 7.2 08/02/2020   ALBUMIN 4.4 08/02/2020   LABGLOB 2.8 08/02/2020   AGRATIO 1.6 08/02/2020   BILITOT 0.3 08/02/2020   ALKPHOS 106 08/02/2020   AST 23 08/02/2020   ALT 24 08/02/2020   ANIONGAP 11 04/03/2019      Objective    LMP 08/06/2018  BP Readings from Last 3  Encounters:  12/21/20 (!) 148/72  10/21/20 133/85  08/01/20 139/84   Wt Readings from Last 3 Encounters:  12/21/20 (!) 330 lb 3.2 oz (149.8 kg)  10/21/20 (!) 328 lb 3.2 oz (148.9 kg)  08/01/20 (!) 321 lb (145.6 kg)      Physical Exam Vitals reviewed.  Constitutional:      General: She is not in acute distress.    Appearance: Normal appearance. She is well-developed, well-groomed and well-nourished. She is morbidly obese. She is not ill-appearing.  HENT:     Head: Normocephalic and atraumatic.  Eyes:     Extraocular Movements: EOM normal.  Pulmonary:     Effort: Pulmonary effort is normal. No respiratory distress.  Musculoskeletal:     Cervical back: Normal range of motion and neck supple.  Neurological:     Mental Status: She is alert.  Psychiatric:        Mood and Affect: Mood and affect and mood normal.        Behavior: Behavior normal. Behavior is cooperative.        Thought Content: Thought content normal.        Judgment: Judgment normal.        Assessment  & Plan     1. Leg swelling New issue over last week. Will check labs as below and f/u pending results. Patient is on amlodipine. Has been on without issue for last 3 years or so. Possible could be contributing. Will stop amlodipine and change to losartan-HCTZ as noted below.  - CBC w/Diff/Platelet - Basic Metabolic Panel (BMET) - B Nat Peptide  2. Primary hypertension Stop amlodipine. Start losartan-HCTZ. Follow up in 4-6 weeks for BP recheck.  - losartan-hydrochlorothiazide (HYZAAR) 50-12.5 MG tablet; Take 1 tablet by mouth daily.  Dispense: 90 tablet; Refill: 1  3. Class 3 severe obesity due to excess calories with serious comorbidity and body mass index (BMI) of 50.0 to 59.9 in adult [facility]) Worsening issue. Trying to eat smaller portions and healthier options. Exercise limited. Patient is prediabetic as well. Will try to get Ozempic approved for prediabetes and get added benefit of weight loss. She is in agreement to try medication. F/U in 4 weeks for dose adjustment if tolerating well.  - Semaglutide,0.25 or 0.5MG /DOS, (OZEMPIC, 0.25 OR 0.5 MG/DOSE,) 2 MG/1.5ML SOPN; Inject 0.25 mg into the skin once a week for 28 days, THEN 0.5 mg once a week for 28 days.  Dispense: 3 mL; Refill: 0  4. Prediabetes See above medical treatment plan. - Semaglutide,0.25 or 0.5MG /DOS, (OZEMPIC, 0.25 OR 0.5 MG/DOSE,) 2 MG/1.5ML SOPN; Inject 0.25 mg into the skin once a week for 28 days, THEN 0.5 mg once a week for 28 days.  Dispense: 3 mL; Refill: 0   Return in about 4 weeks (around 02/01/2021), or if symptoms worsen or fail to improve.     I discussed the assessment and treatment plan with the patient. The patient was provided an opportunity to ask questions and all were answered. The patient agreed with the plan and demonstrated an understanding of the instructions.   The patient was advised to call back or seek an in-person evaluation if the symptoms worsen or if the condition fails to improve as  anticipated.  I provided 45 minutes of face-to-face time during this encounter via MyChart Video enabled encounter.  Reynolds Bowl, PA-C, have reviewed all documentation for this visit. The documentation on 01/04/21 for the exam, diagnosis, procedures, and orders are all accurate and complete.  [place] [facility] [phone] (phone) [phone] (fax)  [place]

## 2021-01-04 NOTE — Patient Instructions (Signed)
Semaglutide injection solution What is this medicine? SEMAGLUTIDE (Sem a GLOO tide) is used to improve blood sugar control in adults with type 2 diabetes. This medicine may be used with other diabetes medicines. This drug may also reduce the risk of heart attack or stroke if you have type 2 diabetes and risk factors for heart disease. This medicine may be used for other purposes; ask your health care provider or pharmacist if you have questions. COMMON BRAND NAME(S): OZEMPIC What should I tell my health care provider before I take this medicine? They need to know if you have any of these conditions:  endocrine tumors (MEN 2) or if someone in your family had these tumors  eye disease, vision problems  history of pancreatitis  kidney disease  stomach problems  thyroid cancer or if someone in your family had thyroid cancer  an unusual or allergic reaction to semaglutide, other medicines, foods, dyes, or preservatives  pregnant or trying to get pregnant  breast-feeding How should I use this medicine? This medicine is for injection under the skin of your upper leg (thigh), stomach area, or upper arm. It is given once every week (every 7 days). You will be taught how to prepare and give this medicine. Use exactly as directed. Take your medicine at regular intervals. Do not take it more often than directed. If you use this medicine with insulin, you should inject this medicine and the insulin separately. Do not mix them together. Do not give the injections right next to each other. Change (rotate) injection sites with each injection. It is important that you put your used needles and syringes in a special sharps container. Do not put them in a trash can. If you do not have a sharps container, call your pharmacist or healthcare provider to get one. A special MedGuide will be given to you by the pharmacist with each prescription and refill. Be sure to read this information carefully each  time. This drug comes with INSTRUCTIONS FOR USE. Ask your pharmacist for directions on how to use this drug. Read the information carefully. Talk to your pharmacist or health care provider if you have questions. Talk to your pediatrician regarding the use of this medicine in children. Special care may be needed. Overdosage: If you think you have taken too much of this medicine contact a poison control center or emergency room at once. NOTE: This medicine is only for you. Do not share this medicine with others. What if I miss a dose? If you miss a dose, take it as soon as you can within 5 days after the missed dose. Then take your next dose at your regular weekly time. If it has been longer than 5 days after the missed dose, do not take the missed dose. Take the next dose at your regular time. Do not take double or extra doses. If you have questions about a missed dose, contact your health care provider for advice. What may interact with this medicine?  other medicines for diabetes Many medications may cause changes in blood sugar, these include:  alcohol containing beverages  antiviral medicines for HIV or AIDS  aspirin and aspirin-like drugs  certain medicines for blood pressure, heart disease, irregular heart beat  chromium  diuretics  female hormones, such as estrogens or progestins, birth control pills  fenofibrate  gemfibrozil  isoniazid  lanreotide  female hormones or anabolic steroids  MAOIs like Carbex, Eldepryl, Marplan, Nardil, and Parnate  medicines for weight loss  medicines for   allergies, asthma, cold, or cough  medicines for depression, anxiety, or psychotic disturbances  niacin  nicotine  NSAIDs, medicines for pain and inflammation, like ibuprofen or naproxen  octreotide  pasireotide  pentamidine  phenytoin  probenecid  quinolone antibiotics such as ciprofloxacin, levofloxacin, ofloxacin  some herbal dietary supplements  steroid medicines  such as prednisone or cortisone  sulfamethoxazole; trimethoprim  thyroid hormones Some medications can hide the warning symptoms of low blood sugar (hypoglycemia). You may need to monitor your blood sugar more closely if you are taking one of these medications. These include:  beta-blockers, often used for high blood pressure or heart problems (examples include atenolol, metoprolol, propranolol)  clonidine  guanethidine  reserpine This list may not describe all possible interactions. Give your health care provider a list of all the medicines, herbs, non-prescription drugs, or dietary supplements you use. Also tell them if you smoke, drink alcohol, or use illegal drugs. Some items may interact with your medicine. What should I watch for while using this medicine? Visit your doctor or health care professional for regular checks on your progress. Drink plenty of fluids while taking this medicine. Check with your doctor or health care professional if you get an attack of severe diarrhea, nausea, and vomiting. The loss of too much body fluid can make it dangerous for you to take this medicine. A test called the HbA1C (A1C) will be monitored. This is a simple blood test. It measures your blood sugar control over the last 2 to 3 months. You will receive this test every 3 to 6 months. Learn how to check your blood sugar. Learn the symptoms of low and high blood sugar and how to manage them. Always carry a quick-source of sugar with you in case you have symptoms of low blood sugar. Examples include hard sugar candy or glucose tablets. Make sure others know that you can choke if you eat or drink when you develop serious symptoms of low blood sugar, such as seizures or unconsciousness. They must get medical help at once. Tell your doctor or health care professional if you have high blood sugar. You might need to change the dose of your medicine. If you are sick or exercising more than usual, you might need  to change the dose of your medicine. Do not skip meals. Ask your doctor or health care professional if you should avoid alcohol. Many nonprescription cough and cold products contain sugar or alcohol. These can affect blood sugar. Pens should never be shared. Even if the needle is changed, sharing may result in passing of viruses like hepatitis or HIV. Wear a medical ID bracelet or chain, and carry a card that describes your disease and details of your medicine and dosage times. Do not become pregnant while taking this medicine. Women should inform their doctor if they wish to become pregnant or think they might be pregnant. There is a potential for serious side effects to an unborn child. Talk to your health care professional or pharmacist for more information. What side effects may I notice from receiving this medicine? Side effects that you should report to your doctor or health care professional as soon as possible:  allergic reactions like skin rash, itching or hives, swelling of the face, lips, or tongue  breathing problems  changes in vision  diarrhea that continues or is severe  lump or swelling on the neck  severe nausea  signs and symptoms of infection like fever or chills; cough; sore throat; pain or trouble   passing urine  signs and symptoms of low blood sugar such as feeling anxious, confusion, dizziness, increased hunger, unusually weak or tired, sweating, shakiness, cold, irritable, headache, blurred vision, fast heartbeat, loss of consciousness  signs and symptoms of kidney injury like trouble passing urine or change in the amount of urine  trouble swallowing  unusual stomach upset or pain  vomiting Side effects that usually do not require medical attention (report to your doctor or health care professional if they continue or are bothersome):  constipation  diarrhea  nausea  pain, redness, or irritation at site where injected  stomach upset This list may not  describe all possible side effects. Call your doctor for medical advice about side effects. You may report side effects to FDA at 1-800-FDA-1088. Where should I keep my medicine? Keep out of the reach of children. Store unopened pens in a refrigerator between 2 and 8 degrees C (36 and 46 degrees F). Do not freeze. Protect from light and heat. After you first use the pen, it can be stored for 56 days at room temperature between 15 and 30 degrees C (59 and 86 degrees F) or in a refrigerator. Throw away your used pen after 56 days or after the expiration date, whichever comes first. Do not store your pen with the needle attached. If the needle is left on, medicine may leak from the pen. NOTE: This sheet is a summary. It may not cover all possible information. If you have questions about this medicine, talk to your doctor, pharmacist, or health care provider.  2020 [employer]vier/Gold Standard (2019-09-01 09:41:51)  

## 2021-01-05 LAB — CBC WITH DIFFERENTIAL/PLATELET
Basophils Absolute: 0 10*3/uL (ref 0.0–0.2)
Basos: 1 %
EOS (ABSOLUTE): 0 10*3/uL (ref 0.0–0.4)
Eos: 1 %
Hematocrit: 38.8 % (ref 34.0–46.6)
Hemoglobin: 12.5 g/dL (ref 11.1–15.9)
Immature Grans (Abs): 0 10*3/uL (ref 0.0–0.1)
Immature Granulocytes: 0 %
Lymphocytes Absolute: 0.4 10*3/uL — ABNORMAL LOW (ref 0.7–3.1)
Lymphs: 10 %
MCH: 25.4 pg — ABNORMAL LOW (ref 26.6–33.0)
MCHC: 32.2 g/dL (ref 31.5–35.7)
MCV: 79 fL (ref 79–97)
Monocytes Absolute: 0.2 10*3/uL (ref 0.1–0.9)
Monocytes: 4 %
Neutrophils Absolute: 3.1 10*3/uL (ref 1.4–7.0)
Neutrophils: 84 %
Platelets: 221 10*3/uL (ref 150–450)
RBC: 4.92 x10E6/uL (ref 3.77–5.28)
RDW: 14.8 % (ref 11.7–15.4)
WBC: 3.7 10*3/uL (ref 3.4–10.8)

## 2021-01-05 LAB — BASIC METABOLIC PANEL
BUN/Creatinine Ratio: 12 (ref 9–23)
BUN: 14 mg/dL (ref 6–24)
CO2: 21 mmol/L (ref 20–29)
Calcium: 9.2 mg/dL (ref 8.7–10.2)
Chloride: 106 mmol/L (ref 96–106)
Creatinine, Ser: 1.17 mg/dL — ABNORMAL HIGH (ref 0.57–1.00)
GFR calc Af Amer: 60 mL/min/{1.73_m2} (ref >59)
GFR calc non Af Amer: 52 mL/min/{1.73_m2} — ABNORMAL LOW (ref >59)
Glucose: 125 mg/dL — ABNORMAL HIGH (ref 65–99)
Potassium: 4.5 mmol/L (ref 3.5–5.2)
Sodium: 143 mmol/L (ref 134–144)

## 2021-01-05 LAB — BRAIN NATRIURETIC PEPTIDE: BNP: 17.1 pg/mL (ref 0.0–100.0)

## 2021-01-06 ENCOUNTER — Telehealth: Payer: Self-pay

## 2021-01-06 DIAGNOSIS — N289 Disorder of kidney and ureter, unspecified: Secondary | ICD-10-CM

## 2021-01-06 NOTE — Telephone Encounter (Signed)
Seen by patient Patricia Deleon on 01/05/2021 7:06 PM

## 2021-01-06 NOTE — Telephone Encounter (Signed)
-----   Message from Mar Daring, [place] sent at 01/05/2021  3:43 PM EST ----- Barbera Setters,  The only thing on the labs that is standing out is that your Kidney function has declined since last check. I would recommend to push fluids and make sure you are well hydrated. Also try to avoid taking any ibuprofen, aleve, or other NSAIDs. Lets recheck your kidney function (renal panel) in 2 weeks. Also lymphocytes have improved just slightly but are still low.   Grace Bushy, [facility]

## 2021-01-10 ENCOUNTER — Telehealth: Payer: BC Managed Care – PPO | Admitting: Physician Assistant

## 2021-01-10 ENCOUNTER — Encounter: Payer: Self-pay | Admitting: Physician Assistant

## 2021-01-10 DIAGNOSIS — R7303 Prediabetes: Secondary | ICD-10-CM

## 2021-01-11 ENCOUNTER — Telehealth (INDEPENDENT_AMBULATORY_CARE_PROVIDER_SITE_OTHER): Payer: BC Managed Care – PPO | Admitting: Physician Assistant

## 2021-01-11 DIAGNOSIS — R3989 Other symptoms and signs involving the genitourinary system: Secondary | ICD-10-CM | POA: Diagnosis not present

## 2021-01-11 DIAGNOSIS — R110 Nausea: Secondary | ICD-10-CM | POA: Diagnosis not present

## 2021-01-11 LAB — POCT URINALYSIS DIPSTICK
Bilirubin, UA: NEGATIVE
Glucose, UA: NEGATIVE
Ketones, UA: NEGATIVE
Nitrite, UA: POSITIVE
Odor: POSITIVE
Protein, UA: POSITIVE — AB
Spec Grav, UA: 1.025 (ref 1.010–1.025)
Urobilinogen, UA: 0.2 E.U./dL
pH, UA: 6 (ref 5.0–8.0)

## 2021-01-11 MED ORDER — ONDANSETRON HCL 4 MG PO TABS: 4.0000 mg | ORAL_TABLET | Freq: Three times a day (TID) | ORAL | 0 refills | Status: DC | PRN

## 2021-01-11 MED ORDER — SULFAMETHOXAZOLE-TRIMETHOPRIM 800-160 MG PO TABS: 1.0000 | ORAL_TABLET | Freq: Two times a day (BID) | ORAL | 0 refills | Status: DC

## 2021-01-11 NOTE — Progress Notes (Unsigned)
MyChart Video Visit    Virtual Visit via Video Note   This visit type was conducted due to national recommendations for restrictions regarding the COVID-19 Pandemic (e.g. social distancing) in an effort to limit this patient's exposure and mitigate transmission in our community. This patient is at least at moderate risk for complications without adequate follow up. This format is felt to be most appropriate for this patient at this time. Physical exam was limited by quality of the video and audio technology used for the visit.   Patient location: Home Provider location: [facility]  I discussed the limitations of evaluation and management by telemedicine and the availability of in person appointments. The patient expressed understanding and agreed to proceed.  Patient: Patricia Deleon   DOB: [birth year]   60 y.o. Female  MRN: [id number] Visit Date: 01/11/2021  Today's healthcare provider: Mar Daring, PA-C   Chief Complaint  Patient presents with  . Urinary Tract Infection   Subjective    HPI HPI    Urinary Tract Infection    This is a new problem.  Recent episode started yesterday.  The problem has been gradually worsening since onset.  Severity of the pain is moderate.  The problem occurs intermittently.  The patient  has not been recently treated for similar symptoms.  Abdominal Pain: Absent.  Back Pain: Absent.  Chills: Absent.  Cloudy malodorus urine: Present.  Constipation: Absent.  Cramping: Absent.  Diarrhea: Present.  Discharge: Absent.  Fever: Absent.  Hematuria: Present.  Nausea: Present.  Vomiting: Absent.       Last edited by Doristine Devoid, CMA on 01/11/2021  5:11 PM. (History)     She has been pushing fluids. She does report that today has been her worst day. She is having a lot of aching and nausea today.   Patient Active Problem List   Diagnosis Date Noted  . Skin infection 12/25/2020  . Cutaneous abscess of right lower extremity  12/25/2020  . Fungal infection 12/25/2020  . Moderate episode of recurrent major depressive disorder ([place]) 05/28/2019  . Right optic neuritis 08/25/2018  . Excessive daytime sleepiness 08/25/2018  . Multiple sclerosis ([place]) 07/08/2018  . Neck pain 07/08/2018  . Transient vision disturbance of left eye 06/18/2018  . Headache 06/18/2018  . Urge incontinence 06/18/2018  . Numbness 06/18/2018  . Acne rosacea 05/07/2016  . Agoraphobia with panic disorder 09/28/2015  . Clinical depression 09/28/2015  . 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 Acid reflux 09/28/2015  . Displacement of lumbar intervertebral disc without myelopathy 09/28/2015  . Hypercholesteremia 09/28/2015  . Insomnia 09/28/2015  . Thoracic, 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  . Anemia, iron deficiency 09/28/2015  . Adiposity 09/28/2015  . Episodic paroxysmal anxiety disorder 09/28/2015  . Borderline diabetes 09/28/2015  . Restless leg 09/28/2015  . Apnea, sleep 09/28/2015  . Avitaminosis D 09/28/2015  . Hypothyroidism 07/11/2015  . Hypertension 06/09/2015   Past Medical History:  Diagnosis Date  . Agoraphobia with panic disorder   . Anemia   . Depression   . Erosive gastritis   . Fundic gland polyps of stomach, benign   . Geographic tongue   . GERD (gastroesophageal reflux disease)   . History of esophagogastroduodenoscopy (EGD)   . Hypercholesterolemia   . Hypertension   . Intervertebral disc disorder    thoracic,thoracolumber,lumbosacral  . Multiple sclerosis ([place])   . Obesity   . Sleep apnea       Medications: Outpatient Medications Prior  to Visit  Medication Sig  . ARIPiprazole (ABILIFY) 10 MG tablet Take 1 tablet (10 mg total) by mouth daily.  . Armodafinil (NUVIGIL) 200 MG TABS One po qAM  . aspirin 81 MG chewable tablet Chew 81 mg by mouth daily.  . baclofen (LIORESAL) 10 MG tablet Take a half tablet to 2 tablets by mouth at bedtime for nighttime spasticity  .  cephALEXin (KEFLEX) 500 MG capsule Take 1 capsule (500 mg total) by mouth 3 (three) times daily.  . Cholecalciferol (VITAMIN D3) 2000 UNITS capsule Take 4,000 Units by mouth daily.   . Ferrous Sulfate Dried (HM SLOW RELEASE IRON) 45 MG TBCR Take by mouth.  . gabapentin (NEURONTIN) 100 MG capsule TAKE 1 CAPSULE BY MOUTH TWICE A DAY  . Glucosamine Sulfate-MSM (MSM/GLUCOSAMINE) 250-250 MG CAPS Take 1 tablet by mouth 2 (two) times daily.   Marland Kitchen ketoconazole (NIZORAL) 2 % cream Apply 1 application topically daily.  Marland Kitchen ketoconazole (NIZORAL) 2 % shampoo Apply 1 application topically 2 (two) times a week.  . levothyroxine (SYNTHROID) 150 MCG tablet TAKE 1 TABLET BY MOUTH ONCE DAILY BEFORE BREAKFAST  . losartan-hydrochlorothiazide (HYZAAR) 50-12.5 MG tablet Take 1 tablet by mouth daily.  . Omega-3 Fatty Acids (FISH OIL) 500 MG CAPS Take by mouth.  . pantoprazole (PROTONIX) 40 MG tablet Take 1 tablet (40 mg total) by mouth 2 (two) times daily before a meal.  . polyethylene glycol powder (GLYCOLAX/MIRALAX) 17 GM/SCOOP powder Take 17 g by mouth at bedtime.   . simvastatin (ZOCOR) 20 MG tablet Take 1 tablet (20 mg total) by mouth at bedtime.  . traZODone (DESYREL) 100 MG tablet Take 2 tablets (200 mg total) by mouth at bedtime.  Marland Kitchen venlafaxine XR (EFFEXOR-XR) 150 MG 24 hr capsule Take 2 capsules (300 mg total) by mouth daily with breakfast.  . vitamin C (ASCORBIC ACID) 500 MG tablet Take 500 mg by mouth daily.  . cyclobenzaprine (FLEXERIL) 5 MG tablet Take 1 tablet (5 mg total) by mouth 3 (three) times daily as needed for muscle spasms. (Patient not taking: No sig reported)  . Semaglutide,0.25 or 0.5MG /DOS, (OZEMPIC, 0.25 OR 0.5 MG/DOSE,) 2 MG/1.5ML SOPN Inject 0.25 mg into the skin once a week for 28 days, THEN 0.5 mg once a week for 28 days. (Patient not taking: Reported on 01/11/2021)  . TECFIDERA 240 MG CPDR Take 1 capsule (240 mg total) by mouth 2 (two) times daily. Call [phone] to make appt for future  refills (Patient not taking: No sig reported)  . [DISCONTINUED] predniSONE (STERAPRED UNI-PAK 21 TAB) 10 MG (21) TBPK tablet 6 day taper; take as directed on package instructions   No facility-administered medications prior to visit.    Review of Systems  Constitutional: Positive for fatigue.  HENT: Negative.   Respiratory: Negative.   Cardiovascular: Negative.   Gastrointestinal: Positive for abdominal pain and nausea.  Genitourinary: Positive for dysuria, frequency and urgency. Negative for flank pain.  Musculoskeletal: Positive for back pain.    @AMBLABREVIEWLINK @  Objective    LMP 08/06/2018  BP Readings from Last 3 Encounters:  12/21/20 (!) 148/72  10/21/20 133/85  08/01/20 139/84   Wt Readings from Last 3 Encounters:  12/21/20 (!) 330 lb 3.2 oz (149.8 kg)  10/21/20 (!) 328 lb 3.2 oz (148.9 kg)  08/01/20 (!) 321 lb (145.6 kg)      Physical Exam Vitals reviewed.  Constitutional:      General: She is not in acute distress.    Appearance: Normal appearance. She  is well-developed and well-nourished. She is obese. She is ill-appearing.  HENT:     Head: Normocephalic and atraumatic.  Eyes:     Extraocular Movements: EOM normal.  Pulmonary:     Effort: Pulmonary effort is normal. No respiratory distress.  Musculoskeletal:     Cervical back: Normal range of motion and neck supple.  Neurological:     Mental Status: She is alert.  Psychiatric:        Mood and Affect: Mood and affect and mood normal.        Behavior: Behavior normal.        Thought Content: Thought content normal.        Judgment: Judgment normal.       Assessment & Plan     1. Suspected UTI Worsening symptoms. UA positive. Will treat empirically with Bactrim as below. Continue to push fluids. Urine sent for culture. Will follow up pending C&S results. She is to call if symptoms do not improve or if they worsen.  - POCT Urinalysis Dipstick - Urine Culture - sulfamethoxazole-trimethoprim  (BACTRIM DS) 800-160 MG tablet; Take 1 tablet by mouth 2 (two) times daily.  Dispense: 20 tablet; Refill: 0  2. Nausea Zofran provided for nausea.  - ondansetron (ZOFRAN) 4 MG tablet; Take 1 tablet (4 mg total) by mouth every 8 (eight) hours as needed.  Dispense: 20 tablet; Refill: 0   No follow-ups on file.     I discussed the assessment and treatment plan with the patient. The patient was provided an opportunity to ask questions and all were answered. The patient agreed with the plan and demonstrated an understanding of the instructions.   The patient was advised to call back or seek an in-person evaluation if the symptoms worsen or if the condition fails to improve as anticipated.  I provided 11 minutes of face-to-face time during this encounter via MyChart Video enabled encounter.  Reynolds Bowl, PA-C, have reviewed all documentation for this visit. The documentation on 01/12/21 for the exam, diagnosis, procedures, and orders are all accurate and complete.  [place] [facility] [phone] (phone) [phone] (fax)  [place]

## 2021-01-12 ENCOUNTER — Encounter: Payer: Self-pay | Admitting: Physician Assistant

## 2021-01-12 DIAGNOSIS — R3989 Other symptoms and signs involving the genitourinary system: Secondary | ICD-10-CM | POA: Diagnosis not present

## 2021-01-16 ENCOUNTER — Telehealth: Payer: Self-pay | Admitting: Physician Assistant

## 2021-01-16 ENCOUNTER — Telehealth (INDEPENDENT_AMBULATORY_CARE_PROVIDER_SITE_OTHER): Payer: BC Managed Care – PPO | Admitting: Physician Assistant

## 2021-01-16 ENCOUNTER — Encounter: Payer: Self-pay | Admitting: Physician Assistant

## 2021-01-16 VITALS — Temp 97.1°F

## 2021-01-16 DIAGNOSIS — R110 Nausea: Secondary | ICD-10-CM | POA: Diagnosis not present

## 2021-01-16 LAB — URINE CULTURE

## 2021-01-16 LAB — SPECIMEN STATUS REPORT

## 2021-01-16 MED ORDER — ONDANSETRON 8 MG PO TBDP: 8.0000 mg | ORAL_TABLET | Freq: Three times a day (TID) | ORAL | 0 refills | Status: DC | PRN

## 2021-01-16 NOTE — Patient Instructions (Signed)
Nausea, Adult Nausea is the feeling that you have an upset stomach or that you are about to vomit. Nausea on its own is not usually a serious concern, but it may be an early sign of a more serious medical problem. As nausea gets worse, it can lead to vomiting. If vomiting develops, or if you are not able to drink enough fluids, you are at risk of becoming dehydrated. Dehydration can make you tired and thirsty, cause you to have a dry mouth, and decrease how often you urinate. Older adults and people with other diseases or a weak disease-fighting system (immune system) are at higher risk for dehydration. The main goals of treating your nausea are:  To relieve your nausea.  To limit repeated nausea episodes.  To prevent vomiting and dehydration. Follow these instructions at home: Watch your symptoms for any changes. Tell your health care provider about them. Follow these instructions as told by your health care provider. Eating and drinking  Take an oral rehydration solution (ORS). This is a drink that is sold at pharmacies and retail stores.  Drink clear fluids slowly and in small amounts as you are able. Clear fluids include water, ice chips, low-calorie sports drinks, and fruit juice that has water added (diluted fruit juice).  Eat bland, easy-to-digest foods in small amounts as you are able. These foods include bananas, applesauce, rice, lean meats, toast, and crackers.  Avoid drinking fluids that contain a lot of sugar or caffeine, such as energy drinks, sports drinks, and soda.  Avoid alcohol.  Avoid spicy or fatty foods.      General instructions  Take over-the-counter and prescription medicines only as told by your health care provider.  Rest at home while you recover.  Drink enough fluid to keep your urine pale yellow.  Breathe slowly and deeply when you feel nauseous.  Avoid smelling things that have strong odors.  Wash your hands often using soap and water. If soap and  water are not available, use hand sanitizer.  Make sure that all people in your household wash their hands well and often.  Keep all follow-up visits as told by your health care provider. This is important. Contact a health care provider if:  Your nausea gets worse.  Your nausea does not go away after two days.  You vomit.  You cannot drink fluids without vomiting.  You have any of the following: ? New symptoms. ? A fever. ? A headache. ? Muscle cramps. ? A rash. ? Pain while urinating.  You feel light-headed or dizzy. Get help right away if:  You have pain in your chest, neck, arm, or jaw.  You feel extremely weak or you faint.  You have vomit that is bright red or looks like coffee grounds.  You have bloody or black stools or stools that look like tar.  You have a severe headache, a stiff neck, or both.  You have severe pain, cramping, or bloating in your abdomen.  You have difficulty breathing or are breathing very quickly.  Your heart is beating very quickly.  Your skin feels cold and clammy.  You feel confused.  You have signs of dehydration, such as: ? Dark urine, very little urine, or no urine. ? Cracked lips. ? Dry mouth. ? Sunken eyes. ? Sleepiness. ? Weakness. These symptoms may represent a serious problem that is an emergency. Do not wait to see if the symptoms will go away. Get medical help right away. Call your local emergency services (  911 in the [place] Do not drive yourself to the hospital. Summary  Nausea is the feeling that you have an upset stomach or that you are about to vomit. Nausea on its own is not usually a serious concern, but it may be an early sign of a more serious medical problem.  If vomiting develops, or if you are not able to drink enough fluids, you are at risk of becoming dehydrated.  Follow recommendations for eating and drinking and take over-the-counter and prescription medicines only as told by your health care  provider.  Contact a health care provider right away if your symptoms worsen or you have new symptoms.  Keep all follow-up visits as told by your health care provider. This is important. This information is not intended to replace advice given to you by your health care provider. Make sure you discuss any questions you have with your health care provider. Document Revised: 11/17/2019 Document Reviewed: 05/27/2018 Elsevier Patient Education  2021 Elsevier Inc.  

## 2021-01-16 NOTE — Progress Notes (Signed)
MyChart Video Visit    Virtual Visit via Video Note   This visit type was conducted due to national recommendations for restrictions regarding the COVID-19 Pandemic (e.g. social distancing) in an effort to limit this patient's exposure and mitigate transmission in our community. This patient is at least at moderate risk for complications without adequate follow up. This format is felt to be most appropriate for this patient at this time. Physical exam was limited by quality of the video and audio technology used for the visit.   Patient location: Home Provider location: Home office in [place]  I discussed the limitations of evaluation and management by telemedicine and the availability of in person appointments. The patient expressed understanding and agreed to proceed.  Patient: Patricia Deleon   DOB: Jun 08, 1961   60 y.o. Female  MRN: [id number] Visit Date: 01/16/2021  Today's healthcare provider: Mar Daring, PA-C   Chief Complaint  Patient presents with  . Nausea   Subjective    HPI  Patient C/O nausea, vomiting and poor appetite since last Wednesday. Patient reports she is not able to eat anything, and reports zofran is not helping with nausea. Patient reports she has lost over 20 pounds in the last few days. Patient reports she is drinking and staying well hydrated. Patient reports she has not been able to work for four days. Patient denies any UTI symptoms, fever, cough, congestion, abdominal pain. Patient denies any blood in vomit or in diarrhea. Patient reports she is taking Bactrim as prescribed. Did not take today's dose due to nausea.  Patient Active Problem List   Diagnosis Date Noted  . Skin infection 12/25/2020  . Cutaneous abscess of right lower extremity 12/25/2020  . Fungal infection 12/25/2020  . Moderate episode of recurrent major depressive disorder ([place]) 05/28/2019  . Right optic neuritis 08/25/2018  . Excessive daytime sleepiness  08/25/2018  . Multiple sclerosis ([place]) 07/08/2018  . Neck pain 07/08/2018  . Transient vision disturbance of left eye 06/18/2018  . Headache 06/18/2018  . Urge incontinence 06/18/2018  . Numbness 06/18/2018  . Acne rosacea 05/07/2016  . Agoraphobia with panic disorder 09/28/2015  . Clinical depression 09/28/2015  . 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 Acid reflux 09/28/2015  . Displacement of lumbar intervertebral disc without myelopathy 09/28/2015  . Hypercholesteremia 09/28/2015  . Insomnia 09/28/2015  . Thoracic, 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  . Anemia, iron deficiency 09/28/2015  . Adiposity 09/28/2015  . Episodic paroxysmal anxiety disorder 09/28/2015  . Borderline diabetes 09/28/2015  . Restless leg 09/28/2015  . Apnea, sleep 09/28/2015  . Avitaminosis D 09/28/2015  . Hypothyroidism 07/11/2015  . Hypertension 06/09/2015   Past Medical History:  Diagnosis Date  . Agoraphobia with panic disorder   . Anemia   . Depression   . Erosive gastritis   . Fundic gland polyps of stomach, benign   . Geographic tongue   . GERD (gastroesophageal reflux disease)   . History of esophagogastroduodenoscopy (EGD)   . Hypercholesterolemia   . Hypertension   . Intervertebral disc disorder    thoracic,thoracolumber,lumbosacral  . Multiple sclerosis ([place])   . Obesity   . Sleep apnea    Social History   Tobacco Use  . Smoking status: Never Smoker  . Smokeless tobacco: Never Used  Vaping Use  . Vaping Use: Never used  Substance Use Topics  . Alcohol use: No    Alcohol/week: 0.0 standard drinks  . Drug use: No   Allergies  Allergen Reactions  . Shellfish Allergy Hives    Medications: Outpatient Medications Prior to Visit  Medication Sig  . ARIPiprazole (ABILIFY) 10 MG tablet Take 1 tablet (10 mg total) by mouth daily.  . Armodafinil (NUVIGIL) 200 MG TABS One po qAM  . aspirin 81 MG chewable tablet Chew 81 mg by mouth daily.  . baclofen  (LIORESAL) 10 MG tablet Take a half tablet to 2 tablets by mouth at bedtime for nighttime spasticity  . Cholecalciferol (VITAMIN D3) 2000 UNITS capsule Take 4,000 Units by mouth daily.   . Ferrous Sulfate Dried (HM SLOW RELEASE IRON) 45 MG TBCR Take by mouth.  . gabapentin (NEURONTIN) 100 MG capsule TAKE 1 CAPSULE BY MOUTH TWICE A DAY  . Glucosamine Sulfate-MSM (MSM/GLUCOSAMINE) 250-250 MG CAPS Take 1 tablet by mouth 2 (two) times daily.   Marland Kitchen ketoconazole (NIZORAL) 2 % cream Apply 1 application topically daily.  Marland Kitchen ketoconazole (NIZORAL) 2 % shampoo Apply 1 application topically 2 (two) times a week.  . levothyroxine (SYNTHROID) 150 MCG tablet TAKE 1 TABLET BY MOUTH ONCE DAILY BEFORE BREAKFAST  . losartan-hydrochlorothiazide (HYZAAR) 50-12.5 MG tablet Take 1 tablet by mouth daily.  . Omega-3 Fatty Acids (FISH OIL) 500 MG CAPS Take by mouth.  . ondansetron (ZOFRAN) 4 MG tablet Take 1 tablet (4 mg total) by mouth every 8 (eight) hours as needed.  . pantoprazole (PROTONIX) 40 MG tablet Take 1 tablet (40 mg total) by mouth 2 (two) times daily before a meal.  . polyethylene glycol powder (GLYCOLAX/MIRALAX) 17 GM/SCOOP powder Take 17 g by mouth at bedtime.   . simvastatin (ZOCOR) 20 MG tablet Take 1 tablet (20 mg total) by mouth at bedtime.  . sulfamethoxazole-trimethoprim (BACTRIM DS) 800-160 MG tablet Take 1 tablet by mouth 2 (two) times daily.  . traZODone (DESYREL) 100 MG tablet Take 2 tablets (200 mg total) by mouth at bedtime.  Marland Kitchen venlafaxine XR (EFFEXOR-XR) 150 MG 24 hr capsule Take 2 capsules (300 mg total) by mouth daily with breakfast.  . vitamin C (ASCORBIC ACID) 500 MG tablet Take 500 mg by mouth daily.  . cyclobenzaprine (FLEXERIL) 5 MG tablet Take 1 tablet (5 mg total) by mouth 3 (three) times daily as needed for muscle spasms. (Patient not taking: No sig reported)  . Semaglutide,0.25 or 0.5MG /DOS, (OZEMPIC, 0.25 OR 0.5 MG/DOSE,) 2 MG/1.5ML SOPN Inject 0.25 mg into the skin once a week for 28  days, THEN 0.5 mg once a week for 28 days. (Patient not taking: Reported on 01/11/2021)  . TECFIDERA 240 MG CPDR Take 1 capsule (240 mg total) by mouth 2 (two) times daily. Call [phone] to make appt for future refills (Patient not taking: No sig reported)  . [DISCONTINUED] cephALEXin (KEFLEX) 500 MG capsule Take 1 capsule (500 mg total) by mouth 3 (three) times daily. (Patient not taking: Reported on 01/16/2021)   No facility-administered medications prior to visit.    Review of Systems  Constitutional: Positive for appetite change, fatigue and unexpected weight change.  HENT: Positive for congestion and rhinorrhea.   Respiratory: Negative.   Cardiovascular: Negative.   Gastrointestinal: Positive for nausea and vomiting.  Neurological: Negative.       Objective    Temp (!) 97.1 F (36.2 C) (Oral)   LMP 08/06/2018    Physical Exam Vitals reviewed.  Constitutional:      General: She is not in acute distress.    Appearance: She is well-developed and well-nourished. She is not ill-appearing.  HENT:  Head: Normocephalic and atraumatic.  Eyes:     Extraocular Movements: EOM normal.  Pulmonary:     Effort: Pulmonary effort is normal. No respiratory distress.  Musculoskeletal:     Cervical back: Normal range of motion and neck supple.  Neurological:     Mental Status: She is alert.  Psychiatric:        Mood and Affect: Mood and affect and mood normal.        Behavior: Behavior normal.        Thought Content: Thought content normal.        Judgment: Judgment normal.       Assessment & Plan     1. Nausea Secondary to UTI most likely. Try to continue Bactrim for UTI as it is susceptible. Zofran increased to 8mg  ODT as below. Add gatorade or pedialyte to replenish electrolytes. Push fluids. Bland diet and increase as tolerated. Call if not improving. Discussed precautions of when to go to the ER.  - ondansetron (ZOFRAN ODT) 8 MG disintegrating tablet; Take 1 tablet (8 mg  total) by mouth every 8 (eight) hours as needed for nausea or vomiting.  Dispense: 20 tablet; Refill: 0   No follow-ups on file.     I discussed the assessment and treatment plan with the patient. The patient was provided an opportunity to ask questions and all were answered. The patient agreed with the plan and demonstrated an understanding of the instructions.   The patient was advised to call back or seek an in-person evaluation if the symptoms worsen or if the condition fails to improve as anticipated.  I provided 12 minutes of face-to-face time during this encounter via MyChart Video enabled encounter.   Reynolds Bowl, PA-C, have reviewed all documentation for this visit. The documentation on 01/16/21 for the exam, diagnosis, procedures, and orders are all accurate and complete.  [place] [facility] [phone] (phone) [phone] (fax)  [place]

## 2021-01-17 ENCOUNTER — Encounter: Payer: Self-pay | Admitting: Physician Assistant

## 2021-01-18 MED ORDER — HYDROXYZINE HCL 25 MG PO TABS: 25.0000 mg | ORAL_TABLET | Freq: Three times a day (TID) | ORAL | 0 refills | Status: DC | PRN

## 2021-01-18 NOTE — Addendum Note (Signed)
Addended by: Mar Daring on: 01/18/2021 09:10 AM   Modules accepted: Orders

## 2021-01-20 IMAGING — DX PORTABLE CHEST - 1 VIEW
1 series · 1 of 1 positions shown · non-contrast
Comparison: 03/11/2015

CLINICAL DATA: Central chest pain, cough

EXAM:
PORTABLE CHEST 1 VIEW

[chest ap]
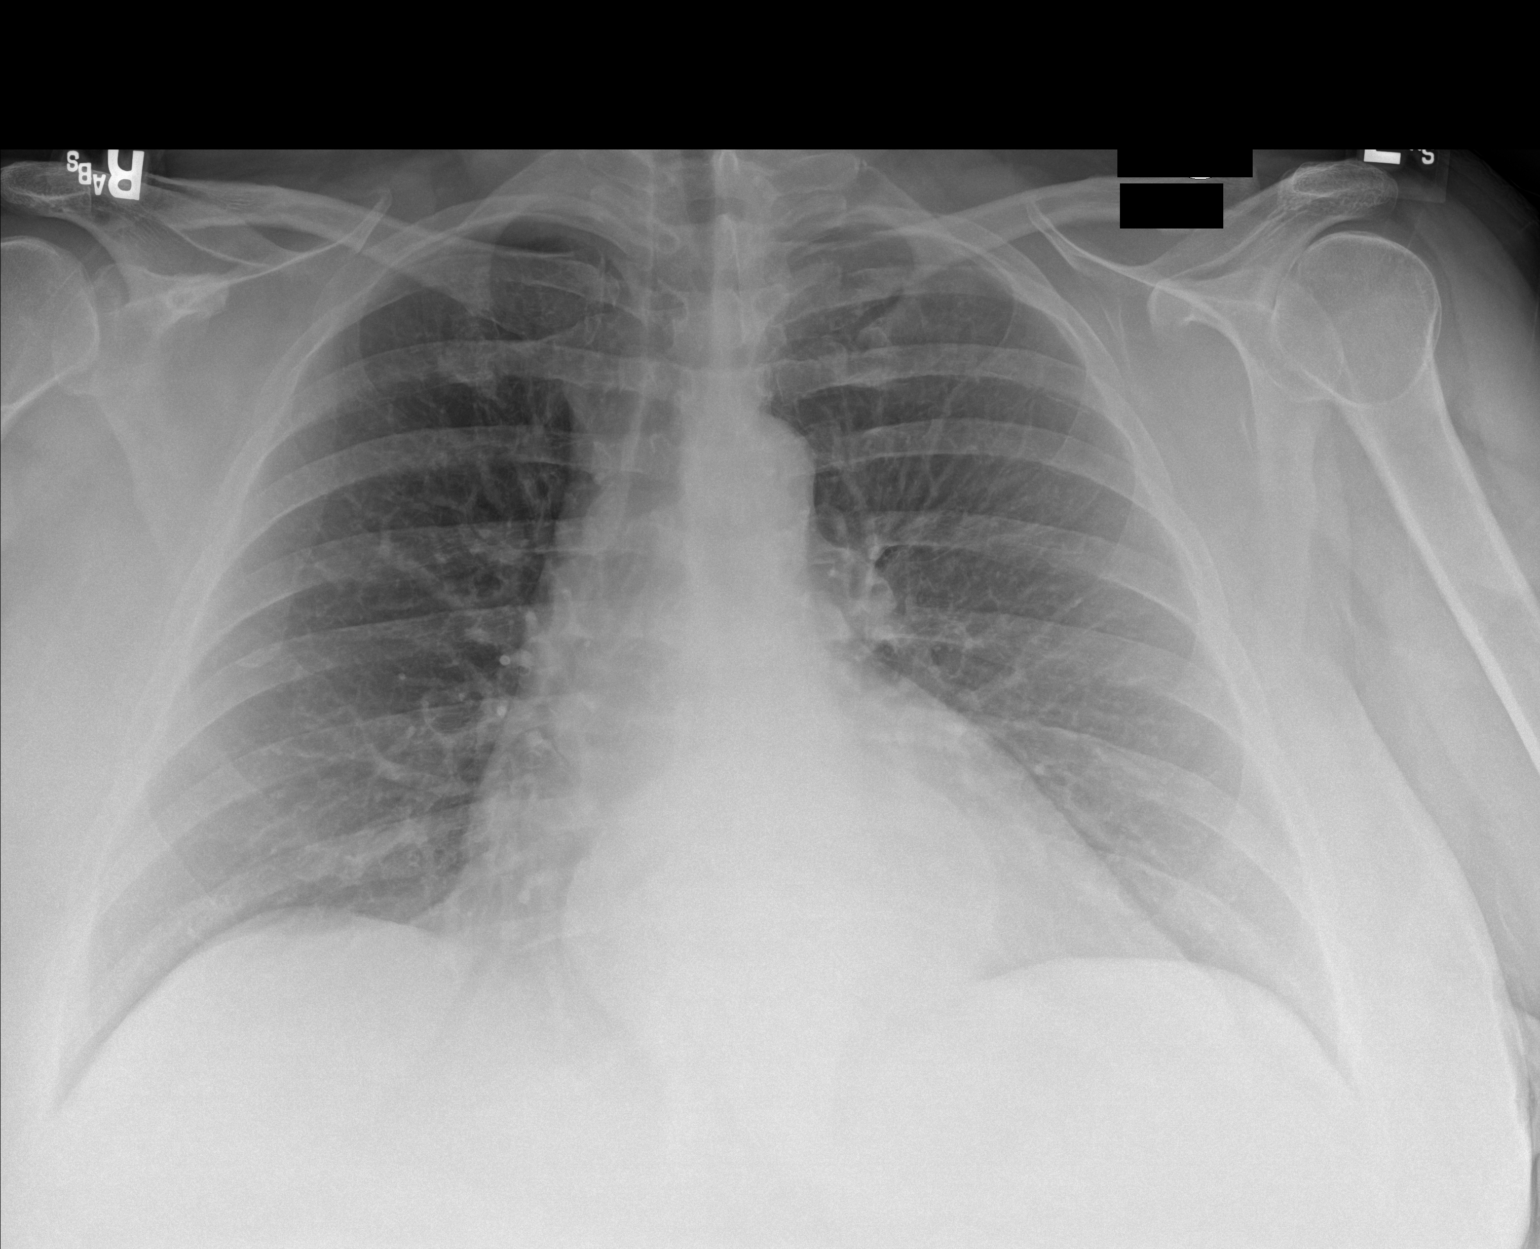

[1 of 1 positions shown; findings below may reference images not displayed]

FINDINGS: Mild cardiac enlargement but normal vascularity. Lungs remain clear.
Large hiatal hernia as before. Negative for edema, effusion or
pneumothorax. Trachea midline. No acute osseous finding.
IMPRESSION: Stable cardiomegaly without interval change or acute process

Large hiatal hernia

## 2021-01-26 DIAGNOSIS — L709 Acne, unspecified: Secondary | ICD-10-CM | POA: Diagnosis not present

## 2021-01-26 DIAGNOSIS — L959 Vasculitis limited to the skin, unspecified: Secondary | ICD-10-CM | POA: Diagnosis not present

## 2021-02-08 DIAGNOSIS — G35 Multiple sclerosis: Secondary | ICD-10-CM | POA: Diagnosis not present

## 2021-02-08 DIAGNOSIS — Z79899 Other long term (current) drug therapy: Secondary | ICD-10-CM | POA: Diagnosis not present

## 2021-02-08 DIAGNOSIS — Z23 Encounter for immunization: Secondary | ICD-10-CM | POA: Diagnosis not present

## 2021-02-16 ENCOUNTER — Other Ambulatory Visit: Payer: Self-pay | Admitting: Physician Assistant

## 2021-02-16 DIAGNOSIS — K219 Gastro-esophageal reflux disease without esophagitis: Secondary | ICD-10-CM

## 2021-02-23 DIAGNOSIS — F331 Major depressive disorder, recurrent, moderate: Secondary | ICD-10-CM | POA: Diagnosis not present

## 2021-02-23 DIAGNOSIS — F5104 Psychophysiologic insomnia: Secondary | ICD-10-CM | POA: Diagnosis not present

## 2021-03-07 DIAGNOSIS — N289 Disorder of kidney and ureter, unspecified: Secondary | ICD-10-CM | POA: Diagnosis not present

## 2021-03-07 DIAGNOSIS — Z79899 Other long term (current) drug therapy: Secondary | ICD-10-CM | POA: Diagnosis not present

## 2021-03-08 LAB — RENAL FUNCTION PANEL
Albumin: 4.2 g/dL (ref 3.8–4.9)
BUN/Creatinine Ratio: 14 (ref 9–23)
BUN: 15 mg/dL (ref 6–24)
CO2: 22 mmol/L (ref 20–29)
Calcium: 9.4 mg/dL (ref 8.7–10.2)
Chloride: 97 mmol/L (ref 96–106)
Creatinine, Ser: 1.04 mg/dL — ABNORMAL HIGH (ref 0.57–1.00)
Glucose: 100 mg/dL — ABNORMAL HIGH (ref 65–99)
Phosphorus: 4.1 mg/dL (ref 3.0–4.3)
Potassium: 4.1 mmol/L (ref 3.5–5.2)
Sodium: 138 mmol/L (ref 134–144)
eGFR: 63 mL/min/{1.73_m2} (ref >59)

## 2021-06-09 DIAGNOSIS — G35 Multiple sclerosis: Secondary | ICD-10-CM | POA: Diagnosis not present

## 2021-06-09 DIAGNOSIS — Z79899 Other long term (current) drug therapy: Secondary | ICD-10-CM | POA: Diagnosis not present

## 2021-06-09 DIAGNOSIS — D72810 Lymphocytopenia: Secondary | ICD-10-CM | POA: Diagnosis not present

## 2021-06-30 ENCOUNTER — Other Ambulatory Visit: Payer: Self-pay | Admitting: Physician Assistant

## 2021-06-30 DIAGNOSIS — I10 Essential (primary) hypertension: Secondary | ICD-10-CM

## 2021-07-19 DIAGNOSIS — E785 Hyperlipidemia, unspecified: Secondary | ICD-10-CM | POA: Diagnosis not present

## 2021-07-19 DIAGNOSIS — R61 Generalized hyperhidrosis: Secondary | ICD-10-CM | POA: Diagnosis not present

## 2021-07-19 DIAGNOSIS — E039 Hypothyroidism, unspecified: Secondary | ICD-10-CM | POA: Diagnosis not present

## 2021-07-19 DIAGNOSIS — G35 Multiple sclerosis: Secondary | ICD-10-CM | POA: Diagnosis not present

## 2021-07-19 DIAGNOSIS — I10 Essential (primary) hypertension: Secondary | ICD-10-CM | POA: Diagnosis not present

## 2021-07-19 DIAGNOSIS — H469 Unspecified optic neuritis: Secondary | ICD-10-CM | POA: Diagnosis not present

## 2021-07-19 DIAGNOSIS — F329 Major depressive disorder, single episode, unspecified: Secondary | ICD-10-CM | POA: Diagnosis not present

## 2021-07-19 DIAGNOSIS — D72810 Lymphocytopenia: Secondary | ICD-10-CM | POA: Diagnosis not present

## 2021-07-19 DIAGNOSIS — D72819 Decreased white blood cell count, unspecified: Secondary | ICD-10-CM | POA: Diagnosis not present

## 2021-07-24 ENCOUNTER — Other Ambulatory Visit: Payer: Self-pay | Admitting: Family Medicine

## 2021-07-24 DIAGNOSIS — I10 Essential (primary) hypertension: Secondary | ICD-10-CM

## 2021-07-24 NOTE — Telephone Encounter (Signed)
   Notes to clinic:  Requesting a 90 day supply  Requested Prescriptions  Pending Prescriptions Disp Refills   losartan-hydrochlorothiazide (HYZAAR) 50-12.5 MG tablet [Pharmacy Med Name: LOSARTAN-HCTZ 50-12.5 MG TAB] 90 tablet 1    Sig: TAKE 1 TABLET BY MOUTH EVERY DAY      Cardiovascular: ARB + Diuretic Combos Failed - 07/24/2021  9:44 AM      Failed - Cr in normal range and within 180 days    Creatinine, Ser  Date Value Ref Range Status  03/07/2021 1.04 (H) 0.57 - 1.00 mg/dL Final          Failed - Last BP in normal range    BP Readings from Last 1 Encounters:  12/21/20 (!) 148/72          Failed - Valid encounter within last 6 months    Recent Outpatient Visits           6 months ago Nausea   [facility] [place], Anderson Malta M, PA-C   6 months ago Suspected UTI   [place], [place], [place]   6 months ago Leg swelling   [place], [place], [place]   7 months ago Skin infection   [facility], Kelby Aline, FNP   9 months ago Rhomboid muscle pain   [facility] Fenton Malling M, [place]                Passed - K in normal range and within 180 days    Potassium  Date Value Ref Range Status  03/07/2021 4.1 3.5 - 5.2 mmol/L Final          Passed - Na in normal range and within 180 days    Sodium  Date Value Ref Range Status  03/07/2021 138 134 - 144 mmol/L Final          Passed - Ca in normal range and within 180 days    Calcium  Date Value Ref Range Status  03/07/2021 9.4 8.7 - 10.2 mg/dL Final   Calcium, Ion  Date Value Ref Range Status  10/11/2018 1.19 1.15 - 1.40 mmol/L Final          Passed - Patient is not pregnant        traZODone (DESYREL) 100 MG tablet [Pharmacy Med Name: TRAZODONE 100 MG TABLET] 180 tablet 1    Sig: TAKE 2 TABLETS BY MOUTH AT BEDTIME.      Psychiatry: Antidepressants - Serotonin Modulator Failed - 07/24/2021  9:44 AM       Failed - Valid encounter within last 6 months    Recent Outpatient Visits           6 months ago Nausea   [facility] Fenton Malling [place], [place]   6 months ago Suspected UTI   [place], [place], [place]   6 months ago Leg swelling   [place], [place], [place]   7 months ago Skin infection   [facility] Fl[facility], Kelby Aline, FNP   9 months ago Rhomboid muscle pain   [place], [place]                Passed - Completed PHQ-2 or PHQ-9 in the last 360 days

## 2021-07-28 DIAGNOSIS — G35 Multiple sclerosis: Secondary | ICD-10-CM | POA: Diagnosis not present

## 2021-08-09 DIAGNOSIS — H469 Unspecified optic neuritis: Secondary | ICD-10-CM | POA: Diagnosis not present

## 2021-08-09 DIAGNOSIS — F331 Major depressive disorder, recurrent, moderate: Secondary | ICD-10-CM | POA: Diagnosis not present

## 2021-08-09 DIAGNOSIS — Z79899 Other long term (current) drug therapy: Secondary | ICD-10-CM | POA: Diagnosis not present

## 2021-08-09 DIAGNOSIS — G35 Multiple sclerosis: Secondary | ICD-10-CM | POA: Diagnosis not present

## 2021-08-09 DIAGNOSIS — Z8669 Personal history of other diseases of the nervous system and sense organs: Secondary | ICD-10-CM | POA: Diagnosis not present

## 2021-08-19 ENCOUNTER — Other Ambulatory Visit: Payer: Self-pay | Admitting: Adult Health

## 2021-08-19 ENCOUNTER — Other Ambulatory Visit: Payer: Self-pay | Admitting: Physician Assistant

## 2021-08-19 DIAGNOSIS — I10 Essential (primary) hypertension: Secondary | ICD-10-CM

## 2021-08-19 DIAGNOSIS — K219 Gastro-esophageal reflux disease without esophagitis: Secondary | ICD-10-CM

## 2021-08-19 DIAGNOSIS — E7800 Pure hypercholesterolemia, unspecified: Secondary | ICD-10-CM

## 2021-08-29 ENCOUNTER — Telehealth: Payer: Self-pay

## 2021-08-29 NOTE — Telephone Encounter (Signed)
Patient scheduled a physical appt with Daneil Dan for 10/25/21.   She wants to know if she can get her meds refilled until she can get in for her physical.    This was the soonest appt. For [place].  Please let patient know.

## 2021-08-29 NOTE — Telephone Encounter (Signed)
Contacted patient who states that she has enough medication at this time but was worried if she ran out before appointment would we be able to refill, and I advised patient that we would be able to authorize a 30day supply if she is out before her appt. Patient states that she will contact office back if she needs refills. KW

## 2021-09-19 ENCOUNTER — Other Ambulatory Visit: Payer: Self-pay | Admitting: Family Medicine

## 2021-09-19 DIAGNOSIS — K219 Gastro-esophageal reflux disease without esophagitis: Secondary | ICD-10-CM

## 2021-09-19 DIAGNOSIS — E7800 Pure hypercholesterolemia, unspecified: Secondary | ICD-10-CM

## 2021-09-20 DIAGNOSIS — G35 Multiple sclerosis: Secondary | ICD-10-CM | POA: Diagnosis not present

## 2021-10-16 ENCOUNTER — Other Ambulatory Visit: Payer: Self-pay | Admitting: Family Medicine

## 2021-10-16 DIAGNOSIS — K219 Gastro-esophageal reflux disease without esophagitis: Secondary | ICD-10-CM

## 2021-10-16 DIAGNOSIS — E7800 Pure hypercholesterolemia, unspecified: Secondary | ICD-10-CM

## 2021-10-16 NOTE — Telephone Encounter (Signed)
Appointment scheduled 10/25/21 Requested Prescriptions  Pending Prescriptions Disp Refills  . pantoprazole (PROTONIX) 40 MG tablet [Pharmacy Med Name: PANTOPRAZOLE SOD DR 40 MG TAB] 60 tablet 0    Sig: TAKE 1 TABLET (40 MG TOTAL) BY MOUTH 2 (TWO) TIMES DAILY BEFORE A MEAL.     Gastroenterology: Proton Pump Inhibitors Passed - 10/16/2021  8:29 AM      Passed - Valid encounter within last 12 months    Recent Outpatient Visits          9 months ago Nausea   [facility] Fenton Malling [place], [place]   9 months ago Suspected UTI   [facility] Fenton Malling M, [place]   9 months ago Leg swelling   [facility] Fenton Malling M, [place]   9 months ago Skin infection   [facility] Fl[place], Kelby Aline, FNP   12 months ago Rhomboid muscle pain   [facility], Clearnce Sorrel, [place]      Future Appointments            In 1 week Gwyneth Sprout, [place], [place]           . simvastatin (ZOCOR) 20 MG tablet [Pharmacy Med Name: SIMVASTATIN 20 MG TABLET] 30 tablet 0    Sig: TAKE 1 TABLET BY MOUTH EVERYDAY AT BEDTIME     Cardiovascular:  Antilipid - Statins Failed - 10/16/2021  8:29 AM      Failed - Total Cholesterol in normal range and within 360 days    Cholesterol, Total  Date Value Ref Range Status  08/02/2020 244 (H) 100 - 199 mg/dL Final         Failed - LDL in normal range and within 360 days    LDL Chol Calc (NIH)  Date Value Ref Range Status  08/02/2020 162 (H) 0 - 99 mg/dL Final         Failed - HDL in normal range and within 360 days    HDL  Date Value Ref Range Status  08/02/2020 40 >39 mg/dL Final         Failed - Triglycerides in normal range and within 360 days    Triglycerides  Date Value Ref Range Status  08/02/2020 224 (H) 0 - 149 mg/dL Final         Passed - Patient is not pregnant      Passed - Valid encounter within last 12 months    Recent Outpatient  Visits          9 months ago Nausea   [place], [place], [place]   9 months ago Suspected UTI   [place], [place], [place]   9 months ago Leg swelling   [facility] Fenton Malling M, [place]   9 months ago Skin infection   [facility], Kelby Aline, FNP   12 months ago Rhomboid muscle pain   [employer], Clearnce Sorrel, [place]      Future Appointments            In 1 week Gwyneth Sprout, [place], [place]

## 2021-10-16 NOTE — Telephone Encounter (Signed)
Requested medication (s) are due for refill today - yes  Requested medication (s) are on the active medication list -yes  Future visit scheduled -yes  Last refill: 09/19/21  Notes to clinic: Request RF: fails lab protocol- last lab 08/02/20  Requested Prescriptions  Pending Prescriptions Disp Refills   simvastatin (ZOCOR) 20 MG tablet [Pharmacy Med Name: SIMVASTATIN 20 MG TABLET] 30 tablet 0    Sig: TAKE 1 TABLET BY MOUTH EVERYDAY AT BEDTIME     Cardiovascular:  Antilipid - Statins Failed - 10/16/2021  8:29 AM      Failed - Total Cholesterol in normal range and within 360 days    Cholesterol, Total  Date Value Ref Range Status  08/02/2020 244 (H) 100 - 199 mg/dL Final          Failed - LDL in normal range and within 360 days    LDL Chol Calc (NIH)  Date Value Ref Range Status  08/02/2020 162 (H) 0 - 99 mg/dL Final          Failed - HDL in normal range and within 360 days    HDL  Date Value Ref Range Status  08/02/2020 40 >39 mg/dL Final          Failed - Triglycerides in normal range and within 360 days    Triglycerides  Date Value Ref Range Status  08/02/2020 224 (H) 0 - 149 mg/dL Final          Passed - Patient is not pregnant      Passed - Valid encounter within last 12 months    Recent Outpatient Visits           9 months ago Nausea   [facility] Fenton Malling M, [place]   9 months ago Suspected UTI   [facility] Fenton Malling M, [place]   9 months ago Leg swelling   [facility] Fenton Malling M, [place]   9 months ago Skin infection   [facility], Kelby Aline, FNP   12 months ago Rhomboid muscle pain   [employer], Clearnce Sorrel, [place]       Future Appointments             In 1 week Gwyneth Sprout, FNP [employer], [facility]            Signed Prescriptions Disp Refills   pantoprazole (PROTONIX) 40 MG tablet 60 tablet 0    Sig: TAKE 1  TABLET (40 MG TOTAL) BY MOUTH 2 (TWO) TIMES DAILY BEFORE A MEAL.     Gastroenterology: Proton Pump Inhibitors Passed - 10/16/2021  8:29 AM      Passed - Valid encounter within last 12 months    Recent Outpatient Visits           9 months ago Nausea   [facility] Fenton Malling [place], [place]   9 months ago Suspected UTI   [facility] Fenton Malling M, [place]   9 months ago Leg swelling   [facility] Fenton Malling M, [place]   9 months ago Skin infection   [facility], Kelby Aline, FNP   12 months ago Rhomboid muscle pain   [facility] [place], Clearnce Sorrel, [place]       Future Appointments             In 1 week Gwyneth Sprout, [place], [place]  Requested Prescriptions  Pending Prescriptions Disp Refills   simvastatin (ZOCOR) 20 MG tablet [Pharmacy Med Name: SIMVASTATIN 20 MG TABLET] 30 tablet 0    Sig: TAKE 1 TABLET BY MOUTH EVERYDAY AT BEDTIME     Cardiovascular:  Antilipid - Statins Failed - 10/16/2021  8:29 AM      Failed - Total Cholesterol in normal range and within 360 days    Cholesterol, Total  Date Value Ref Range Status  08/02/2020 244 (H) 100 - 199 mg/dL Final          Failed - LDL in normal range and within 360 days    LDL Chol Calc (NIH)  Date Value Ref Range Status  08/02/2020 162 (H) 0 - 99 mg/dL Final          Failed - HDL in normal range and within 360 days    HDL  Date Value Ref Range Status  08/02/2020 40 >39 mg/dL Final          Failed - Triglycerides in normal range and within 360 days    Triglycerides  Date Value Ref Range Status  08/02/2020 224 (H) 0 - 149 mg/dL Final          Passed - Patient is not pregnant      Passed - Valid encounter within last 12 months    Recent Outpatient Visits           9 months ago Nausea   [facility] Fenton Malling [place], [place]   9 months ago Suspected UTI    [place], [place], [place]   9 months ago Leg swelling   [facility] Fenton Malling M, [place]   9 months ago Skin infection   [facility], Kelby Aline, FNP   12 months ago Rhomboid muscle pain   [employer], Clearnce Sorrel, [place]       Future Appointments             In 1 week Gwyneth Sprout, [place], [facility]            Signed Prescriptions Disp Refills   pantoprazole (PROTONIX) 40 MG tablet 60 tablet 0    Sig: TAKE 1 TABLET (40 MG TOTAL) BY MOUTH 2 (TWO) TIMES DAILY BEFORE A MEAL.     Gastroenterology: Proton Pump Inhibitors Passed - 10/16/2021  8:29 AM      Passed - Valid encounter within last 12 months    Recent Outpatient Visits           9 months ago Nausea   [facility] Fenton Malling [place], [place]   9 months ago Suspected UTI   [facility] Fenton Malling [place], [place]   9 months ago Leg swelling   [facility] Fenton Malling M, [place]   9 months ago Skin infection   [facility] Flinch[facility], Kelby Aline, FNP   12 months ago Rhomboid muscle pain   [facility], Clearnce Sorrel, [place]       Future Appointments             In 1 week Gwyneth Sprout, [place], [place]

## 2021-10-25 ENCOUNTER — Other Ambulatory Visit: Payer: Self-pay

## 2021-10-25 ENCOUNTER — Ambulatory Visit (INDEPENDENT_AMBULATORY_CARE_PROVIDER_SITE_OTHER): Payer: BC Managed Care – PPO | Admitting: Family Medicine

## 2021-10-25 ENCOUNTER — Encounter: Payer: Self-pay | Admitting: Family Medicine

## 2021-10-25 VITALS — BP 135/84 | HR 80 | Temp 97.2°F | Resp 16 | Ht 66.0 in | Wt 323.4 lb

## 2021-10-25 DIAGNOSIS — F339 Major depressive disorder, recurrent, unspecified: Secondary | ICD-10-CM

## 2021-10-25 DIAGNOSIS — G4700 Insomnia, unspecified: Secondary | ICD-10-CM

## 2021-10-25 DIAGNOSIS — G4733 Obstructive sleep apnea (adult) (pediatric): Secondary | ICD-10-CM | POA: Diagnosis not present

## 2021-10-25 DIAGNOSIS — G4719 Other hypersomnia: Secondary | ICD-10-CM | POA: Diagnosis not present

## 2021-10-25 DIAGNOSIS — E7800 Pure hypercholesterolemia, unspecified: Secondary | ICD-10-CM

## 2021-10-25 DIAGNOSIS — M5431 Sciatica, right side: Secondary | ICD-10-CM

## 2021-10-25 DIAGNOSIS — K140 Glossitis: Secondary | ICD-10-CM | POA: Insufficient documentation

## 2021-10-25 DIAGNOSIS — G35 Multiple sclerosis: Secondary | ICD-10-CM

## 2021-10-25 DIAGNOSIS — Z23 Encounter for immunization: Secondary | ICD-10-CM | POA: Insufficient documentation

## 2021-10-25 DIAGNOSIS — Z1231 Encounter for screening mammogram for malignant neoplasm of breast: Secondary | ICD-10-CM

## 2021-10-25 DIAGNOSIS — Z0000 Encounter for general adult medical examination without abnormal findings: Secondary | ICD-10-CM

## 2021-10-25 DIAGNOSIS — E039 Hypothyroidism, unspecified: Secondary | ICD-10-CM

## 2021-10-25 DIAGNOSIS — I10 Essential (primary) hypertension: Secondary | ICD-10-CM

## 2021-10-25 DIAGNOSIS — B49 Unspecified mycosis: Secondary | ICD-10-CM

## 2021-10-25 DIAGNOSIS — K219 Gastro-esophageal reflux disease without esophagitis: Secondary | ICD-10-CM

## 2021-10-25 MED ORDER — ARIPIPRAZOLE 10 MG PO TABS: 10.0000 mg | ORAL_TABLET | Freq: Every day | ORAL | 11 refills | Status: DC

## 2021-10-25 MED ORDER — LEVOTHYROXINE SODIUM 150 MCG PO TABS: 150.0000 ug | ORAL_TABLET | Freq: Every day | ORAL | 3 refills | Status: DC

## 2021-10-25 MED ORDER — TRAZODONE HCL 100 MG PO TABS: 200.0000 mg | ORAL_TABLET | Freq: Every day | ORAL | 11 refills | Status: DC

## 2021-10-25 MED ORDER — LOSARTAN POTASSIUM-HCTZ 50-12.5 MG PO TABS: 1.0000 | ORAL_TABLET | Freq: Every day | ORAL | 11 refills | Status: DC

## 2021-10-25 MED ORDER — NYSTATIN 100000 UNIT/ML MT SUSP: 10.0000 mL | Freq: Four times a day (QID) | OROMUCOSAL | 0 refills | Status: DC | PRN

## 2021-10-25 MED ORDER — VENLAFAXINE HCL ER 150 MG PO CP24: 300.0000 mg | ORAL_CAPSULE | Freq: Every day | ORAL | 11 refills | Status: DC

## 2021-10-25 MED ORDER — SIMVASTATIN 20 MG PO TABS: 20.0000 mg | ORAL_TABLET | Freq: Every day | ORAL | 11 refills | Status: DC

## 2021-10-25 MED ORDER — GABAPENTIN 100 MG PO CAPS: 100.0000 mg | ORAL_CAPSULE | Freq: Two times a day (BID) | ORAL | 11 refills | Status: DC

## 2021-10-25 MED ORDER — KETOCONAZOLE 2 % EX CREA
1.0000 | TOPICAL_CREAM | Freq: Every day | CUTANEOUS | 0 refills | Status: AC
Start: 2021-10-25 — End: ?

## 2021-10-25 MED ORDER — PANTOPRAZOLE SODIUM 40 MG PO TBEC: 40.0000 mg | DELAYED_RELEASE_TABLET | Freq: Two times a day (BID) | ORAL | 11 refills | Status: DC

## 2021-10-25 MED ORDER — ARMODAFINIL 200 MG PO TABS: ORAL_TABLET | ORAL | 5 refills | Status: DC

## 2021-10-25 NOTE — Assessment & Plan Note (Signed)
Report of acute tongue discomfort r/t carbonated beverages Recommend food journal to track symptoms

## 2021-10-25 NOTE — Assessment & Plan Note (Signed)
Reviewed consent; provided today

## 2021-10-25 NOTE — Assessment & Plan Note (Signed)
Chronic; well managed [facility]

## 2021-10-25 NOTE — Progress Notes (Signed)
Complete physical exam   Patient: Patricia Deleon   DOB: 03-22-1961   60 y.o. Female  MRN: [id number] Visit Date: 10/25/2021  Today's healthcare provider: Gwyneth Sprout, FNP   Chief Complaint  Patient presents with   Annual Exam   Subjective     HPI  Patricia Deleon is a 60 y.o. female who presents today for a complete physical exam.  She reports consuming a poor diet. The patient has a physically strenuous job, but has no regular exercise apart from work.  She generally feels fairly well. She reports sleeping fairly well. She does have additional problems to discuss today.  Last reported Pap- 08/01/20 Colonoscopy 07/07/18 Past Medical History:  Diagnosis Date   Agoraphobia with panic disorder    Anemia    Depression    Erosive gastritis    Fundic gland polyps of stomach, benign    Geographic tongue    GERD (gastroesophageal reflux disease)    History of esophagogastroduodenoscopy (EGD)    Hypercholesterolemia    Hypertension    Intervertebral disc disorder    thoracic,thoracolumber,lumbosacral   Multiple sclerosis ([place])    Obesity    Sleep apnea    Past Surgical History:  Procedure Laterality Date   COLONOSCOPY     COLONOSCOPY WITH PROPOFOL N/A 07/07/2018   Procedure: COLONOSCOPY WITH PROPOFOL;  Surgeon: Lollie Sails, MD;  Location: [facility] ENDOSCOPY;  Service: Endoscopy;  Laterality: N/A;   ESOPHAGOGASTRODUODENOSCOPY (EGD) WITH PROPOFOL N/A 01/31/2016   Procedure: ESOPHAGOGASTRODUODENOSCOPY (EGD) WITH PROPOFOL;  Surgeon: Lollie Sails, MD;  Location: [facility] ENDOSCOPY;  Service: Endoscopy;  Laterality: N/A;   IR FLUORO GUIDE CV LINE RIGHT  10/06/2018   IR REMOVAL TUN CV CATH W/O FL  10/15/2018   IR US GUIDE VASC ACCESS RIGHT  96/01/9527   NISSEN FUNDOPLICATION     Social History   Socioeconomic History   Marital status: Married    Spouse name: Not on file   Number of children: Not on file   Years of education: Not on file   Highest education level:  Not on file  Occupational History   Not on file  Tobacco Use   Smoking status: Never   Smokeless tobacco: Never  Vaping Use   Vaping Use: Never used  Substance and Sexual Activity   Alcohol use: No    Alcohol/week: 0.0 standard drinks   Drug use: No   Sexual activity: Not on file    Comment: Married   Other Topics Concern   Not on file  Social History Narrative   Not on file   Social Determinants of Health   Financial Resource Strain: Not on file  Food Insecurity: Not on file  Transportation Needs: Not on file  Physical Activity: Not on file  Stress: Not on file  Social Connections: Not on file  Intimate Partner Violence: Not on file   Family Status  Relation Name Status   Mother  Deceased   Father  Deceased   Brother  Alive   Son  Alive   MGF  Deceased   Azure  Deceased   MGM  (Not Specified)   Family History  Problem Relation Age of Onset   Lung cancer Mother    Hypertension Mother    Alcohol abuse Mother    Anxiety disorder Mother    Hypertension Father    Arthritis Father    Alcohol abuse Father    Esophageal cancer Father    Alcohol abuse Brother  Hypertension Brother    Autism Son    Coronary artery disease Maternal Grandfather    Heart attack Maternal Grandfather    CVA Paternal Grandmother    Skin cancer Paternal Grandmother    Cancer Maternal Grandmother    Allergies  Allergen Reactions   Shellfish Allergy Hives    Patient Care Team: Rubye Beach as PCP - General (Family Medicine)   Medications: Outpatient Medications Prior to Visit  Medication Sig   aspirin 81 MG chewable tablet Chew 81 mg by mouth daily.   baclofen (LIORESAL) 10 MG tablet Take a half tablet to 2 tablets by mouth at bedtime for nighttime spasticity   Cholecalciferol (VITAMIN D3) 2000 UNITS capsule Take 4,000 Units by mouth daily.    Ferrous Sulfate Dried (HM SLOW RELEASE IRON) 45 MG TBCR Take by mouth.   Glatiramer Acetate 40 MG/ML SOSY Inject into the  skin.   Glucosamine Sulfate-MSM (MSM/GLUCOSAMINE) 250-250 MG CAPS Take 1 tablet by mouth 2 (two) times daily.    ketoconazole (NIZORAL) 2 % shampoo APPLY 1 APPLICATION TOPICALLY 2 (TWO) TIMES A WEEK.   Omega-3 Fatty Acids (FISH OIL) 500 MG CAPS Take by mouth.   polyethylene glycol powder (GLYCOLAX/MIRALAX) 17 GM/SCOOP powder Take 17 g by mouth at bedtime.    vitamin C (ASCORBIC ACID) 500 MG tablet Take 500 mg by mouth daily.   [DISCONTINUED] ARIPiprazole (ABILIFY) 10 MG tablet TAKE 1 TABLET BY MOUTH EVERY DAY   [DISCONTINUED] Armodafinil (NUVIGIL) 200 MG TABS One po qAM   [DISCONTINUED] gabapentin (NEURONTIN) 100 MG capsule TAKE 1 CAPSULE BY MOUTH TWICE A DAY   [DISCONTINUED] ketoconazole (NIZORAL) 2 % cream Apply 1 application topically daily.   [DISCONTINUED] levothyroxine (SYNTHROID) 150 MCG tablet TAKE 1 TABLET BY MOUTH ONCE DAILY BEFORE BREAKFAST   [DISCONTINUED] losartan-hydrochlorothiazide (HYZAAR) 50-12.5 MG tablet TAKE 1 TABLET BY MOUTH EVERY DAY   [DISCONTINUED] pantoprazole (PROTONIX) 40 MG tablet TAKE 1 TABLET (40 MG TOTAL) BY MOUTH 2 (TWO) TIMES DAILY BEFORE A MEAL.   [DISCONTINUED] simvastatin (ZOCOR) 20 MG tablet TAKE 1 TABLET BY MOUTH EVERYDAY AT BEDTIME   [DISCONTINUED] traZODone (DESYREL) 100 MG tablet TAKE 2 TABLETS BY MOUTH AT BEDTIME   [DISCONTINUED] venlafaxine XR (EFFEXOR-XR) 150 MG 24 hr capsule TAKE 2 CAPSULES (300 MG TOTAL) BY MOUTH DAILY WITH BREAKFAST.   [DISCONTINUED] cyclobenzaprine (FLEXERIL) 5 MG tablet Take 1 tablet (5 mg total) by mouth 3 (three) times daily as needed for muscle spasms. (Patient not taking: No sig reported)   [DISCONTINUED] hydrOXYzine (ATARAX/VISTARIL) 25 MG tablet Take 1 tablet (25 mg total) by mouth 3 (three) times daily as needed for anxiety. (Patient not taking: Reported on 10/25/2021)   [DISCONTINUED] ondansetron (ZOFRAN ODT) 8 MG disintegrating tablet Take 1 tablet (8 mg total) by mouth every 8 (eight) hours as needed for nausea or vomiting.  (Patient not taking: Reported on 10/25/2021)   [DISCONTINUED] sulfamethoxazole-trimethoprim (BACTRIM DS) 800-160 MG tablet Take 1 tablet by mouth 2 (two) times daily. (Patient not taking: Reported on 10/25/2021)   [DISCONTINUED] TECFIDERA 240 MG CPDR Take 1 capsule (240 mg total) by mouth 2 (two) times daily. Call [phone] to make appt for future refills (Patient not taking: No sig reported)   No facility-administered medications prior to visit.    Review of Systems  Constitutional:  Positive for diaphoresis and fatigue.  HENT:  Positive for tinnitus.   Respiratory:  Positive for apnea.   Gastrointestinal:  Positive for constipation.  Musculoskeletal:  Positive for myalgias.  Allergic/Immunologic: Positive for immunocompromised state.  Hematological:  Bruises/bleeds easily.  All other systems reviewed and are negative.    Objective    BP 135/84   Pulse 80   Temp (!) 97.2 F (36.2 C) (Oral)   Resp 16   Ht 5\' 6"  (1.676 m)   Wt (!) 323 lb 6.4 oz (146.7 kg)   LMP 08/06/2018   BMI 52.20 kg/m    Physical Exam Vitals and nursing note reviewed.  Constitutional:      General: She is awake. She is not in acute distress.    Appearance: Normal appearance. She is well-developed and well-groomed. She is obese. She is not ill-appearing, toxic-appearing or diaphoretic.  HENT:     Head: Normocephalic and atraumatic.     Jaw: There is normal jaw occlusion. No trismus, tenderness, swelling or pain on movement.     Right Ear: Hearing, tympanic membrane, ear canal and external ear normal. There is no impacted cerumen.     Left Ear: Hearing, tympanic membrane, ear canal and external ear normal. There is no impacted cerumen.     Nose: Nose normal. No congestion or rhinorrhea.     Right Turbinates: Not enlarged, swollen or pale.     Left Turbinates: Not enlarged, swollen or pale.     Right Sinus: No maxillary sinus tenderness or frontal sinus tenderness.     Left Sinus: No maxillary sinus  tenderness or frontal sinus tenderness.     Mouth/Throat:     Lips: Pink.     Mouth: Mucous membranes are moist. No injury.     Tongue: No lesions.     Pharynx: Oropharynx is clear. Uvula midline. No pharyngeal swelling, oropharyngeal exudate, posterior oropharyngeal erythema or uvula swelling.     Tonsils: No tonsillar exudate or tonsillar abscesses.  Eyes:     General: Lids are normal. Lids are everted, no foreign bodies appreciated. Vision grossly intact. Gaze aligned appropriately. No allergic shiner or visual field deficit.       Right eye: No discharge.        Left eye: No discharge.     Extraocular Movements: Extraocular movements intact.     Conjunctiva/sclera: Conjunctivae normal.     Right eye: Right conjunctiva is not injected. No exudate.    Left eye: Left conjunctiva is not injected. No exudate.    Pupils: Pupils are equal, round, and reactive to light.     Comments: R eye blind s/s MS  Neck:     Thyroid: No thyroid mass, thyromegaly or thyroid tenderness.     Vascular: No carotid bruit.     Trachea: Trachea normal.  Cardiovascular:     Rate and Rhythm: Normal rate and regular rhythm.     Pulses: Normal pulses.          Carotid pulses are 2+ on the right side and 2+ on the left side.      Radial pulses are 2+ on the right side and 2+ on the left side.       Dorsalis pedis pulses are 2+ on the right side and 2+ on the left side.       Posterior tibial pulses are 2+ on the right side and 2+ on the left side.     Heart sounds: Normal heart sounds, S1 normal and S2 normal. No murmur heard.   No friction rub. No gallop.  Pulmonary:     Effort: Pulmonary effort is normal. No respiratory distress.     Breath  sounds: Normal breath sounds and air entry. No stridor. No wheezing, rhonchi or rales.  Chest:     Chest wall: No tenderness.     Comments: Breast exam WDL; discussed 'know your lemons' campaign and self exam Abdominal:     General: Abdomen is flat. Bowel sounds are  normal. There is no distension.     Palpations: Abdomen is soft. There is no mass.     Tenderness: There is no abdominal tenderness. There is no right CVA tenderness, left CVA tenderness, guarding or rebound.     Hernia: No hernia is present.  Genitourinary:    Comments: Exam deferred; denies complaints Musculoskeletal:        General: No swelling, tenderness, deformity or signs of injury. Normal range of motion.     Cervical back: Full passive range of motion without pain, normal range of motion and neck supple. No edema, rigidity or tenderness. No muscular tenderness.     Right lower leg: No edema.     Left lower leg: No edema.  Lymphadenopathy:     Cervical: No cervical adenopathy.     Right cervical: No superficial, deep or posterior cervical adenopathy.    Left cervical: No superficial, deep or posterior cervical adenopathy.  Skin:    General: Skin is warm and dry.     Capillary Refill: Capillary refill takes less than 2 seconds.     Coloration: Skin is not jaundiced or pale.     Findings: No bruising, erythema, lesion or rash.  Neurological:     General: No focal deficit present.     Mental Status: She is alert and oriented to person, place, and time. Mental status is at baseline.     GCS: GCS eye subscore is 4. GCS verbal subscore is 5. GCS motor subscore is 6.     Sensory: Sensation is intact. No sensory deficit.     Motor: Motor function is intact. No weakness.     Coordination: Coordination is intact. Coordination normal.     Gait: Gait is intact. Gait normal.  Psychiatric:        Attention and Perception: Attention and perception normal.        Mood and Affect: Mood and affect normal.        Speech: Speech normal.        Behavior: Behavior normal. Behavior is cooperative.        Thought Content: Thought content normal.        Cognition and Memory: Cognition and memory normal.        Judgment: Judgment normal.     Last depression screening scores PHQ 2/9 Scores  10/25/2021 10/21/2020 08/01/2020  PHQ - 2 Score 0 2 2  PHQ- 9 Score 5 10 7    Last fall risk screening Fall Risk  08/01/2020  Falls in the past year? 0  Number falls in past yr: 0  Injury with Fall? 0  Follow up Falls evaluation completed   Last Audit-C alcohol use screening Alcohol Use Disorder Test (AUDIT) 10/25/2021  1. How often do you have a drink containing alcohol? 0  2. How many drinks containing alcohol do you have on a typical day when you are drinking? 0  3. How often do you have six or more drinks on one occasion? 0  AUDIT-C Score 0  Alcohol Brief Interventions/Follow-up -   A score of 3 or more in women, and 4 or more in men indicates increased risk for alcohol abuse, EXCEPT if all  of the points are from question 1   No results found for any visits on 10/25/21.  Assessment & Plan    Routine Health Maintenance and Physical Exam  Exercise Activities and Dietary recommendations  Goals   None     Immunization History  Administered Date(s) Administered   Influenza Split 09/21/2011   Influenza,inj,Quad PF,6+ Mos 10/12/2015, 10/25/2021   Influenza-Unspecified 10/16/2016   Tdap 02/21/2011    Health Maintenance  Topic Date Due   COVID-19 Vaccine (1) Never done   Pneumococcal Vaccine 62-74 Years old (1 - PCV) Never done   Zoster Vaccines- Shingrix (1 of 2) Never done   TETANUS/TDAP  02/21/2021   MAMMOGRAM  08/23/2022   PAP SMEAR-Modifier  08/02/2023   COLONOSCOPY (Pts 45-55yrs Insurance coverage will need to be confirmed)  07/07/2028   INFLUENZA VACCINE  Completed   Hepatitis C Screening  Completed   HIV Screening  Completed   HPV VACCINES  Aged Out    Discussed health benefits of physical activity, and encouraged her to engage in regular exercise appropriate for her age and condition.  Problem List Items Addressed This Visit       Cardiovascular and Mediastinum   Hypertension    Chronic; stable Refill sent      Relevant Medications    losartan-hydrochlorothiazide (HYZAAR) 50-12.5 MG tablet   simvastatin (ZOCOR) 20 MG tablet     Respiratory   Apnea, sleep   Relevant Medications   Armodafinil (NUVIGIL) 200 MG TABS     Digestive   Acid reflux    Chronic, stable      Relevant Medications   pantoprazole (PROTONIX) 40 MG tablet   Acute glossitis    Report of acute tongue discomfort r/t carbonated beverages Recommend food journal to track symptoms      Relevant Medications   magic mouthwash (nystatin, hydrocortisone, diphenhydrAMINE) suspension     Endocrine   Hypothyroidism    Chronic, well controlled      Relevant Medications   levothyroxine (SYNTHROID) 150 MCG tablet     Nervous and Auditory   Multiple sclerosis (HCC)    Chronic; well managed [facility]      Relevant Medications   Glatiramer Acetate 40 MG/ML SOSY   Armodafinil (NUVIGIL) 200 MG TABS     Other   Insomnia   Relevant Medications   traZODone (DESYREL) 100 MG tablet   Excessive daytime sleepiness    6 month f/u needed given controlled substance for stimulant; pt happy with use/results      Relevant Medications   Armodafinil (NUVIGIL) 200 MG TABS   Fungal infection    Acute; not current Request for cream for on hand if reoccurence      Relevant Medications   ketoconazole (NIZORAL) 2 % cream   magic mouthwash (nystatin, hydrocortisone, diphenhydrAMINE) suspension   Flu vaccine need - Primary    Reviewed consent; provided today      Relevant Orders   Flu Vaccine QUAD 20mo+IM (Fluarix, Fluzone & Alfiuria Quad PF) (Completed)   Other Visit Diagnoses     Sciatica of right side       Relevant Medications   Glatiramer Acetate 40 MG/ML SOSY   ARIPiprazole (ABILIFY) 10 MG tablet   Armodafinil (NUVIGIL) 200 MG TABS   gabapentin (NEURONTIN) 100 MG capsule   traZODone (DESYREL) 100 MG tablet   venlafaxine XR (EFFEXOR-XR) 150 MG 24 hr capsule   Hypercholesterolemia       Relevant Medications   losartan-hydrochlorothiazide (HYZAAR)  50-12.5  MG tablet   simvastatin (ZOCOR) 20 MG tablet   Depression, recurrent (HCC)       Relevant Medications   ARIPiprazole (ABILIFY) 10 MG tablet   traZODone (DESYREL) 100 MG tablet   venlafaxine XR (EFFEXOR-XR) 150 MG 24 hr capsule   Encounter for screening mammogram for malignant neoplasm of breast       Relevant Orders   MM 3D SCREEN BREAST BILATERAL        Return in about 6 months (around 04/25/2022) for chonic disease management.     Vonna Kotyk, FNP, have reviewed all documentation for this visit. The documentation on 10/25/21 for the exam, diagnosis, procedures, and orders are all accurate and complete.    Gwyneth Sprout, [place] [phone] (phone) [phone] (fax)  [place]

## 2021-10-25 NOTE — Assessment & Plan Note (Signed)
Chronic; stable Refill sent

## 2021-10-25 NOTE — Assessment & Plan Note (Signed)
6 month f/u needed given controlled substance for stimulant; pt happy with use/results

## 2021-10-25 NOTE — Assessment & Plan Note (Signed)
Chronic, well-controlled. 

## 2021-10-25 NOTE — Assessment & Plan Note (Signed)
Acute; not current Request for cream for on hand if reoccurence

## 2021-10-25 NOTE — Assessment & Plan Note (Signed)
Chronic, stable 

## 2021-11-14 ENCOUNTER — Other Ambulatory Visit: Payer: Self-pay | Admitting: Family Medicine

## 2021-11-14 DIAGNOSIS — R7309 Other abnormal glucose: Secondary | ICD-10-CM | POA: Diagnosis not present

## 2021-11-14 DIAGNOSIS — G4719 Other hypersomnia: Secondary | ICD-10-CM | POA: Diagnosis not present

## 2021-11-14 DIAGNOSIS — G35 Multiple sclerosis: Secondary | ICD-10-CM | POA: Diagnosis not present

## 2021-11-15 ENCOUNTER — Encounter: Payer: Self-pay | Admitting: Family Medicine

## 2021-11-15 LAB — COMPREHENSIVE METABOLIC PANEL
ALT: 12 IU/L (ref 0–32)
AST: 14 IU/L (ref 0–40)
Albumin/Globulin Ratio: 1.4 (ref 1.2–2.2)
Albumin: 4.2 g/dL (ref 3.8–4.9)
Alkaline Phosphatase: 100 IU/L (ref 44–121)
BUN/Creatinine Ratio: 17 (ref 9–23)
BUN: 16 mg/dL (ref 6–24)
Bilirubin Total: 0.3 mg/dL (ref 0.0–1.2)
CO2: 23 mmol/L (ref 20–29)
Calcium: 9.2 mg/dL (ref 8.7–10.2)
Chloride: 101 mmol/L (ref 96–106)
Creatinine, Ser: 0.96 mg/dL (ref 0.57–1.00)
Globulin, Total: 2.9 g/dL (ref 1.5–4.5)
Glucose: 119 mg/dL — ABNORMAL HIGH (ref 70–99)
Potassium: 4.2 mmol/L (ref 3.5–5.2)
Sodium: 138 mmol/L (ref 134–144)
Total Protein: 7.1 g/dL (ref 6.0–8.5)
eGFR: 69 mL/min/{1.73_m2} (ref >59)

## 2021-11-15 LAB — CBC WITH DIFFERENTIAL/PLATELET
Basophils Absolute: 0 10*3/uL (ref 0.0–0.2)
Basos: 1 %
EOS (ABSOLUTE): 0 10*3/uL (ref 0.0–0.4)
Eos: 1 %
Hematocrit: 39.8 % (ref 34.0–46.6)
Hemoglobin: 13.1 g/dL (ref 11.1–15.9)
Immature Grans (Abs): 0 10*3/uL (ref 0.0–0.1)
Immature Granulocytes: 0 %
Lymphocytes Absolute: 0.3 10*3/uL — ABNORMAL LOW (ref 0.7–3.1)
Lymphs: 9 %
MCH: 27.1 pg (ref 26.6–33.0)
MCHC: 32.9 g/dL (ref 31.5–35.7)
MCV: 82 fL (ref 79–97)
Monocytes Absolute: 0.2 10*3/uL (ref 0.1–0.9)
Monocytes: 4 %
Neutrophils Absolute: 3.4 10*3/uL (ref 1.4–7.0)
Neutrophils: 85 %
Platelets: 177 10*3/uL (ref 150–450)
RBC: 4.84 x10E6/uL (ref 3.77–5.28)
RDW: 14.2 % (ref 11.7–15.4)
WBC: 4 10*3/uL (ref 3.4–10.8)

## 2021-11-15 LAB — LIPID PANEL
Chol/HDL Ratio: 4.2 ratio (ref 0.0–4.4)
Cholesterol, Total: 163 mg/dL (ref 100–199)
HDL: 39 mg/dL — ABNORMAL LOW (ref >39)
LDL Chol Calc (NIH): 96 mg/dL (ref 0–99)
Triglycerides: 160 mg/dL — ABNORMAL HIGH (ref 0–149)
VLDL Cholesterol Cal: 28 mg/dL (ref 5–40)

## 2021-11-15 LAB — TSH: TSH: 1.72 u[IU]/mL (ref 0.450–4.500)

## 2021-11-15 LAB — T4, FREE: Free T4: 1.04 ng/dL (ref 0.82–1.77)

## 2021-11-15 LAB — HEMOGLOBIN A1C
Est. average glucose Bld gHb Est-mCnc: 126 mg/dL
Hgb A1c MFr Bld: 6 % — ABNORMAL HIGH (ref 4.8–5.6)

## 2021-11-16 MED ORDER — ROSUVASTATIN CALCIUM 20 MG PO TABS: 20.0000 mg | ORAL_TABLET | Freq: Every day | ORAL | 3 refills | Status: DC

## 2021-11-17 ENCOUNTER — Other Ambulatory Visit: Payer: Self-pay | Admitting: Family Medicine

## 2021-11-17 DIAGNOSIS — G4700 Insomnia, unspecified: Secondary | ICD-10-CM

## 2021-11-18 NOTE — Telephone Encounter (Signed)
Requested too soon- last RF 10/25/21 #60 11 RF (verified by [employer])

## 2022-01-04 ENCOUNTER — Other Ambulatory Visit: Payer: Self-pay | Admitting: Family Medicine

## 2022-01-04 DIAGNOSIS — G4700 Insomnia, unspecified: Secondary | ICD-10-CM

## 2022-01-23 DIAGNOSIS — D72810 Lymphocytopenia: Secondary | ICD-10-CM | POA: Diagnosis not present

## 2022-01-23 DIAGNOSIS — I776 Arteritis, unspecified: Secondary | ICD-10-CM | POA: Diagnosis not present

## 2022-01-23 DIAGNOSIS — G35 Multiple sclerosis: Secondary | ICD-10-CM | POA: Diagnosis not present

## 2022-01-29 ENCOUNTER — Other Ambulatory Visit: Payer: Self-pay | Admitting: Family Medicine

## 2022-01-29 DIAGNOSIS — F339 Major depressive disorder, recurrent, unspecified: Secondary | ICD-10-CM

## 2022-01-29 NOTE — Telephone Encounter (Signed)
Requested medication (s) are due for refill today: yes  Requested medication (s) are on the active medication list: yes  Last refill:  10/25/21 #30 11 refills   Future visit scheduled: no  Notes to clinic:  not delegated per protocol . Pharmacy comment: REQUEST FOR 90 DAYS PRESCRIPTION. DX Code Needed     Requested Prescriptions  Pending Prescriptions Disp Refills   ARIPiprazole (ABILIFY) 10 MG tablet [Pharmacy Med Name: ARIPIPRAZOLE 10 MG TABLET] 90 tablet 4    Sig: TAKE 1 TABLET BY MOUTH EVERY DAY     Not Delegated - Psychiatry:  Antipsychotics - Second Generation (Atypical) - aripiprazole Failed - 01/29/2022  9:32 AM      Failed - This refill cannot be delegated      Passed - Valid encounter within last 6 months    Recent Outpatient Visits           3 months ago Flu vaccine need   [facility] Gwyneth Sprout, FNP   1 year ago Nausea   [place], Clearnce Sorrel, [place]   1 year ago Suspected UTI   [place], Clearnce Sorrel, [place]   1 year ago Leg swelling   [place], Clearnce Sorrel, [place]   1 year ago Skin infection   [place], Kelby Aline, FNP

## 2022-02-07 DIAGNOSIS — F331 Major depressive disorder, recurrent, moderate: Secondary | ICD-10-CM | POA: Diagnosis not present

## 2022-03-16 ENCOUNTER — Encounter: Payer: Self-pay | Admitting: Physician Assistant

## 2022-03-16 ENCOUNTER — Ambulatory Visit: Payer: BC Managed Care – PPO | Admitting: Physician Assistant

## 2022-03-16 ENCOUNTER — Other Ambulatory Visit: Payer: Self-pay

## 2022-03-16 VITALS — BP 140/79 | HR 89 | Temp 97.9°F | Resp 16 | Wt 330.0 lb

## 2022-03-16 DIAGNOSIS — N390 Urinary tract infection, site not specified: Secondary | ICD-10-CM | POA: Diagnosis not present

## 2022-03-16 DIAGNOSIS — R319 Hematuria, unspecified: Secondary | ICD-10-CM | POA: Diagnosis not present

## 2022-03-16 DIAGNOSIS — R300 Dysuria: Secondary | ICD-10-CM

## 2022-03-16 DIAGNOSIS — B379 Candidiasis, unspecified: Secondary | ICD-10-CM

## 2022-03-16 DIAGNOSIS — T3695XA Adverse effect of unspecified systemic antibiotic, initial encounter: Secondary | ICD-10-CM

## 2022-03-16 LAB — POCT URINALYSIS DIPSTICK
Bilirubin, UA: POSITIVE
Glucose, UA: NEGATIVE
Ketones, UA: NEGATIVE
Nitrite, UA: NEGATIVE
Protein, UA: POSITIVE — AB
Spec Grav, UA: 1.02 (ref 1.010–1.025)
Urobilinogen, UA: 0.2 E.U./dL
pH, UA: 6.5 (ref 5.0–8.0)

## 2022-03-16 MED ORDER — NITROFURANTOIN MONOHYD MACRO 100 MG PO CAPS: 100.0000 mg | ORAL_CAPSULE | Freq: Two times a day (BID) | ORAL | 0 refills | Status: AC

## 2022-03-16 MED ORDER — FLUCONAZOLE 150 MG PO TABS: 150.0000 mg | ORAL_TABLET | Freq: Once | ORAL | 0 refills | Status: AC

## 2022-03-16 NOTE — Patient Instructions (Addendum)
Based on the results of your urinalysis and your symptoms I believe you have a UTI  ?I am sending in a script for an antibiotic called Macrobid 100 mg to be taken by mouth twice per day for 5 days ?Please finish the entire course of antibiotics to help with resolution of infection ?Stay well hydrated and you can use AZO to assist with urinary discomfort ? If you start feeling feverish, confused, develop more intense pain, flank pain, are unable to urinate; please go to the ED  ? ?It was nice to meet you and I appreciate the opportunity to be involved in your care ? ?

## 2022-03-16 NOTE — Progress Notes (Signed)
?  ? ?I,Sulibeya S Dimas,acting as a Education administrator for [employer], PA-C.,have documented all relevant documentation on the behalf of Jomel Whittlesey E Giavonni Fonder, PA-C,as directed by  [employer], PA-C while in the presence of Eythan Jayne E Peni Rupard, PA-C. ? ? ?Established patient visit ? ? ?Patient: Patricia Deleon   DOB: [birth year]   61 y.o. Female  MRN: [id number] ?Visit Date: 03/16/2022 ? ?Today's healthcare provider: [name], PA-C  ?Introduced myself to the patient as a Journalist, newspaper and provided education on APPs in clinical practice.  ? ? ?Chief Complaint  ?Patient presents with  ? Urinary Tract Infection  ? ?Subjective  ?  ?Dysuria  ?This is a new problem. The current episode started yesterday. The problem occurs every urination. The problem has been gradually improving. The quality of the pain is described as aching and burning. The pain is mild. There has been no fever. Associated symptoms include frequency, nausea and urgency. Pertinent negatives include no chills, discharge, flank pain, hematuria or vomiting. She has tried nothing for the symptoms.   ? ?States symptoms began yesterday  ?States she felt intense urinary urge but was not able to void completely ?States today she feels a bit better but is still having some symptoms ? ? ? ? ?Medications: ?Outpatient Medications Prior to Visit  ?Medication Sig  ? ARIPiprazole (ABILIFY) 10 MG tablet Take 1 tablet (10 mg total) by mouth daily.  ? Armodafinil (NUVIGIL) 200 MG TABS One po qAM  ? aspirin 81 MG chewable tablet Chew 81 mg by mouth daily.  ? baclofen (LIORESAL) 10 MG tablet Take a half tablet to 2 tablets by mouth at bedtime for nighttime spasticity  ? Cholecalciferol (VITAMIN D3) 2000 UNITS capsule Take 4,000 Units by mouth daily.   ? Ferrous Sulfate Dried (HM SLOW RELEASE IRON) 45 MG TBCR Take by mouth.  ? gabapentin (NEURONTIN) 100 MG capsule Take 1 capsule (100 mg total) by mouth 2 (two) times daily.  ? Glatiramer Acetate 40 MG/ML SOSY Inject into the skin.  ? Glucosamine  Sulfate-MSM (MSM/GLUCOSAMINE) 250-250 MG CAPS Take 1 tablet by mouth 2 (two) times daily.   ? ketoconazole (NIZORAL) 2 % cream Apply 1 application topically daily.  ? ketoconazole (NIZORAL) 2 % shampoo APPLY 1 APPLICATION TOPICALLY 2 (TWO) TIMES A WEEK.  ? levothyroxine (SYNTHROID) 150 MCG tablet Take 1 tablet (150 mcg total) by mouth daily before breakfast.  ? losartan-hydrochlorothiazide (HYZAAR) 50-12.5 MG tablet Take 1 tablet by mouth daily.  ? magic mouthwash (nystatin, hydrocortisone, diphenhydrAMINE) suspension Swish and swallow 10 mLs 4 (four) times daily as needed for mouth pain.  ? Omega-3 Fatty Acids (FISH OIL) 500 MG CAPS Take by mouth.  ? pantoprazole (PROTONIX) 40 MG tablet Take 1 tablet (40 mg total) by mouth 2 (two) times daily before a meal.  ? polyethylene glycol powder (GLYCOLAX/MIRALAX) 17 GM/SCOOP powder Take 17 g by mouth at bedtime.   ? rosuvastatin (CRESTOR) 20 MG tablet Take 1 tablet (20 mg total) by mouth daily.  ? traZODone (DESYREL) 100 MG tablet Take 2 tablets (200 mg total) by mouth at bedtime.  ? venlafaxine XR (EFFEXOR-XR) 150 MG 24 hr capsule Take 2 capsules (300 mg total) by mouth daily with breakfast.  ? vitamin C (ASCORBIC ACID) 500 MG tablet Take 500 mg by mouth daily.  ? ?No facility-administered medications prior to visit.  ? ? ?Review of Systems  ?Constitutional:  Negative for chills, diaphoresis, fatigue and fever.  ?Respiratory:  Negative for shortness of  breath.   ?Cardiovascular:  Negative for chest pain.  ?Gastrointestinal:  Positive for nausea. Negative for vomiting.  ?Genitourinary:  Positive for difficulty urinating, dysuria, frequency and urgency. Negative for flank pain and hematuria.  ?Musculoskeletal:  Negative for back pain.  ?Skin:  Negative for rash.  ?Neurological:  Negative for dizziness, light-headedness and headaches.  ? ?  ?  Objective  ?  ?BP 140/79 (BP Location: Left Arm, Patient Position: Sitting, Cuff Size: Large)   Pulse 89   Temp 97.9 ?F (36.6 ?C)  (Temporal)   Resp 16   Wt (!) 330 lb (149.7 kg)   LMP 08/06/2018   BMI 53.26 kg/m?  ?  ? ?Physical Exam ?Constitutional:   ?   Appearance: Normal appearance. She is well-developed and well-groomed. She is morbidly obese.  ?HENT:  ?   Head: Normocephalic and atraumatic.  ?Cardiovascular:  ?   Rate and Rhythm: Normal rate and regular rhythm.  ?   Pulses: Normal pulses.  ?   Heart sounds: Normal heart sounds.  ?Pulmonary:  ?   Effort: Pulmonary effort is normal.  ?   Breath sounds: Normal breath sounds and air entry.  ?Abdominal:  ?   General: Abdomen is protuberant. Bowel sounds are normal.  ?   Palpations: Abdomen is soft.  ?   Tenderness: There is no abdominal tenderness. There is no right CVA tenderness or left CVA tenderness.  ?Musculoskeletal:  ?   Right lower leg: No edema.  ?   Left lower leg: No edema.  ?Neurological:  ?   Mental Status: She is alert.  ?Psychiatric:     ?   Behavior: Behavior is cooperative.  ?  ? ? ? ?Results for orders placed or performed in visit on 03/16/22  ?POCT urinalysis dipstick  ?Result Value Ref Range  ? Color, UA yellow   ? Clarity, UA clear   ? Glucose, UA Negative Negative  ? Bilirubin, UA Positive   ? Ketones, UA Negative   ? Spec Grav, UA 1.020 1.010 - 1.025  ? Blood, UA small   ? pH, UA 6.5 5.0 - 8.0  ? Protein, UA Positive (A) Negative  ? Urobilinogen, UA 0.2 0.2 or 1.0 E.U./dL  ? Nitrite, UA negative   ? Leukocytes, UA Moderate (2+) (A) Negative  ? ? Assessment & Plan  ?  ? ?Problem List Items Addressed This Visit   ?None ?Visit Diagnoses   ? ? Urinary tract infection with hematuria, site unspecified    -  Primary ?Acute, new problem started yesterday ?Symptoms consistent with UTI  ?UA was positive for leukocytes, protein and small RBC ?Suspect this to be simple cystitis due to negative CVA tenderness, no fever, or other systemic indicators ?Will provide Abx - Macrobid 100 mg PO BID x 5 days  ?Urine culture results to dictate if further management is needed ?Follow up  for persistent or lingering symptoms ?  ? Relevant Medications  ? nitrofurantoin, macrocrystal-monohydrate, (MACROBID) 100 MG capsule  ? fluconazole (DIFLUCAN) 150 MG tablet  ? Dysuria     ?Acute, new concern ?UA revealed leukocytes, protein and RBC  ?Suspect UTI  ?Urine culture ordered for susceptibility   ? Relevant Medications  ? nitrofurantoin, macrocrystal-monohydrate, (MACROBID) 100 MG capsule  ? Other Relevant Orders  ? POCT urinalysis dipstick (Completed)  ? Urine Culture  ? Urine Microscopic  ? Antibiotic-induced yeast infection      ? Relevant Medications  ? nitrofurantoin, macrocrystal-monohydrate, (MACROBID) 100 MG capsule  ? fluconazole (  DIFLUCAN) 150 MG tablet  ? ?  ? ? ? ?No follow-ups on file. ? ? ?I, Tej Murdaugh E Marit Goodwill, PA-C, have reviewed all documentation for this visit. The documentation on 03/16/22 for the exam, diagnosis, procedures, and orders are all accurate and complete. ? ? ?Cambrie Sonnenfeld, Glennie Isle MPH ?[place] ?[facility] ? ? ?No follow-ups on file.  ?   ? ? ? ? ?Brittan Mapel E Ashunti Schofield, PA-C  ?[place] ?[phone] (phone) ?[phone] (fax) ? ? [facility]  ?

## 2022-03-17 LAB — URINALYSIS, MICROSCOPIC ONLY: Casts: NONE SEEN /lpf

## 2022-03-20 LAB — URINE CULTURE

## 2022-03-21 DIAGNOSIS — Z79899 Other long term (current) drug therapy: Secondary | ICD-10-CM | POA: Diagnosis not present

## 2022-03-21 DIAGNOSIS — G35 Multiple sclerosis: Secondary | ICD-10-CM | POA: Diagnosis not present

## 2022-03-24 ENCOUNTER — Other Ambulatory Visit: Payer: Self-pay | Admitting: Family Medicine

## 2022-03-24 DIAGNOSIS — E039 Hypothyroidism, unspecified: Secondary | ICD-10-CM

## 2022-03-26 NOTE — Telephone Encounter (Signed)
Requested Prescriptions  ?Pending Prescriptions Disp Refills  ?? levothyroxine (SYNTHROID) 150 MCG tablet [Pharmacy Med Name: LEVOTHYROXINE 150 MCG TABLET] 90 tablet 2  ?  Sig: TAKE 1 TABLET BY MOUTH DAILY BEFORE BREAKFAST.  ?  ? Endocrinology:  Hypothyroid Agents Passed - 03/24/2022 10:37 AM  ?  ?  Passed - TSH in normal range and within 360 days  ?  TSH  ?Date Value Ref Range Status  ?11/14/2021 1.720 0.450 - 4.500 uIU/mL Final  ?   ?  ?  Passed - Valid encounter within last 12 months  ?  Recent Outpatient Visits   ?      ? 1 week ago Urinary tract infection with hematuria, site unspecified  ? [employer], Erin E, PA-C  ? 5 months ago Flu vaccine need  ? [facility] Tally Joe T, FNP  ? 1 year ago Nausea  ? [place], [place]  ? 1 year ago Suspected UTI  ? [place], [place]  ? 1 year ago Leg swelling  ? [facility] [place], Anderson Malta M, [place]  ?  ?  ? ?  ?  ?  ? ?

## 2022-06-03 ENCOUNTER — Ambulatory Visit
Admission: RE | Admit: 2022-06-03 | Discharge: 2022-06-03 | Disposition: A | Discharge status: Home / Self Care | Payer: BC Managed Care – PPO | Source: Ambulatory Visit | Attending: Emergency Medicine | Admitting: Emergency Medicine

## 2022-06-03 VITALS — BP 119/79 | HR 81 | Temp 98.3°F | Resp 16

## 2022-06-03 DIAGNOSIS — N3001 Acute cystitis with hematuria: Secondary | ICD-10-CM | POA: Insufficient documentation

## 2022-06-03 LAB — POCT URINALYSIS DIP (MANUAL ENTRY)
Glucose, UA: NEGATIVE mg/dL
Nitrite, UA: NEGATIVE
Protein Ur, POC: >=300 mg/dL — AB
Spec Grav, UA: 1.02 (ref 1.010–1.025)
Urobilinogen, UA: 0.2 E.U./dL
pH, UA: 6 (ref 5.0–8.0)

## 2022-06-03 MED ORDER — CEPHALEXIN 500 MG PO CAPS
500.0000 mg | ORAL_CAPSULE | Freq: Two times a day (BID) | ORAL | 0 refills | Status: AC
Start: 2022-06-03 — End: 2022-06-08

## 2022-06-03 NOTE — Discharge Instructions (Addendum)
Take the antibiotic as directed.  The urine culture is pending.  We will call you if it shows the need to change or discontinue your antibiotic.    Follow up with your primary care provider if your symptoms are not improving.    

## 2022-06-03 NOTE — ED Triage Notes (Signed)
Pt said 2 days has been having urination frequency and cant control urination. Pt said very painful to urinate and pain in her lower pelvic area. Pt said on Thursday she had one blood clot and blood in her urine.

## 2022-06-03 NOTE — ED Provider Notes (Signed)
Roderic Palau    CSN: [id number] Arrival date & time: 06/03/22  1058      History   Chief Complaint Chief Complaint  Patient presents with   Urinary Frequency    HPI Shama Russell Engelstad is a 61 y.o. female.  Patient presents with dysuria, urinary frequency, hematuria, and bladder pressure x 3-4 days.  No fever, chills, flank pain, vaginal discharge, or other symptoms.  No treatment at home.  Her medical history includes hypertension, multiple sclerosis, borderline diabetes.    The history is provided by the patient and medical records.   Past Medical History:  Diagnosis Date   Agoraphobia with panic disorder    Anemia    Depression    Erosive gastritis    Fundic gland polyps of stomach, benign    Geographic tongue    GERD (gastroesophageal reflux disease)    History of esophagogastroduodenoscopy (EGD)    Hypercholesterolemia    Hypertension    Intervertebral disc disorder    thoracic,thoracolumber,lumbosacral   Multiple sclerosis ([place])    Obesity    Sleep apnea     Patient Active Problem List   Diagnosis Date Noted   Elevated glucose 11/14/2021   Flu vaccine need 10/25/2021   Acute glossitis 10/25/2021   Skin infection 12/25/2020   Cutaneous abscess of right lower extremity 12/25/2020   Fungal infection 12/25/2020   Moderate episode of recurrent major depressive disorder ([place]) 05/28/2019   Right optic neuritis 08/25/2018   Excessive daytime sleepiness 08/25/2018   Multiple sclerosis ([place]) 07/08/2018   Neck pain 07/08/2018   Transient vision disturbance of left eye 06/18/2018   Headache 06/18/2018   Urge incontinence 06/18/2018   Numbness 06/18/2018   Acne rosacea 05/07/2016   Agoraphobia with panic disorder 09/28/2015   Clinical depression 09/28/2015   Geographic tongue 09/28/2015   Acid reflux 09/28/2015   Displacement of lumbar intervertebral disc without myelopathy 09/28/2015   Hypercholesteremia 09/28/2015   Insomnia 09/28/2015   Thoracic,  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   Anemia, iron deficiency 09/28/2015   Adiposity 09/28/2015   Episodic paroxysmal anxiety disorder 09/28/2015   Borderline diabetes 09/28/2015   Restless leg 09/28/2015   Apnea, sleep 09/28/2015   Avitaminosis D 09/28/2015   Hypothyroidism 07/11/2015   Hypertension 06/09/2015    Past Surgical History:  Procedure Laterality Date   COLONOSCOPY     COLONOSCOPY WITH PROPOFOL N/A 07/07/2018   Procedure: COLONOSCOPY WITH PROPOFOL;  Surgeon: Lollie Sails, MD;  Location[facility] ENDOSCOPY;  Service: Endoscopy;  Laterality: N/A;   ESOPHAGOGASTRODUODENOSCOPY (EGD) WITH PROPOFOL N/A 01/31/2016   Procedure: ESOPHAGOGASTRODUODENOSCOPY (EGD) WITH PROPOFOL;  Surgeon: Lollie Sails, MD;  Location[facility] END[facility]COPY;  Service: Endoscopy;  Laterality: N/A;   IR FLUORO GUIDE CV LINE RIGHT  10/06/2018   IR REMOVAL TUN CV CATH W/O FL  10/15/2018   IR US GUIDE VASC ACCESS RIGHT  09/08/8337   NISSEN FUNDOPLICATION      OB History   No obstetric history on file.      Home Medications    Prior to Admission medications   Medication Sig Start Date End Date Taking? Authorizing Provider  cephALEXin (KEFLEX) 500 MG capsule Take 1 capsule (500 mg total) by mouth 2 (two) times daily for 5 days. 06/03/22 06/08/22 Yes Sharion Balloon, NP  ARIPiprazole (ABILIFY) 10 MG tablet Take 1 tablet (10 mg total) by mouth daily. 10/25/21   Gwyneth Sprout, FNP  Armodafinil (NUVIGIL) 200 MG TABS One po qAM  10/25/21   Gwyneth Sprout, FNP  aspirin 81 MG chewable tablet Chew 81 mg by mouth daily.    [provider]  baclofen (LIORESAL) 10 MG tablet Take a half tablet to 2 tablets by mouth at bedtime for nighttime spasticity 10/05/20   [provider]  Cholecalciferol (VITAMIN D3) 2000 UNITS capsule Take 4,000 Units by mouth daily.     [provider]  Ferrous Sulfate Dried (HM SLOW RELEASE IRON) 45 MG TBCR Take by mouth.    [provider]  gabapentin (NEURONTIN) 100 MG capsule Take 1 capsule (100 mg total) by mouth 2 (two) times daily. 10/25/21   Gwyneth Sprout, FNP  Glatiramer Acetate 40 MG/ML SOSY Inject into the skin.    [provider]  Glucosamine Sulfate-MSM (MSM/GLUCOSAMINE) 250-250 MG CAPS Take 1 tablet by mouth 2 (two) times daily.     [provider]  ketoconazole (NIZORAL) 2 % cream Apply 1 application topically daily. 10/25/21   Gwyneth Sprout, FNP  ketoconazole (NIZORAL) 2 % shampoo APPLY 1 APPLICATION TOPICALLY 2 (TWO) TIMES A WEEK. 08/24/21   Tally Joe T, FNP  levothyroxine (SYNTHROID) 150 MCG tablet TAKE 1 TABLET BY MOUTH DAILY BEFORE BREAKFAST. 03/26/22   Gwyneth Sprout, FNP  losartan-hydrochlorothiazide (HYZAAR) 50-12.5 MG tablet Take 1 tablet by mouth daily. 10/25/21   Gwyneth Sprout, FNP  magic mouthwash (nystatin, hydrocortisone, diphenhydrAMINE) suspension Swish and swallow 10 mLs 4 (four) times daily as needed for mouth pain. 10/25/21   Gwyneth Sprout, FNP  Omega-3 Fatty Acids (FISH OIL) 500 MG CAPS Take by mouth.    [provider]  pantoprazole (PROTONIX) 40 MG tablet Take 1 tablet (40 mg total) by mouth 2 (two) times daily before a meal. 10/25/21   Gwyneth Sprout, FNP  polyethylene glycol powder (GLYCOLAX/MIRALAX) 17 GM/SCOOP powder Take 17 g by mouth at bedtime.     [provider]  rosuvastatin (CRESTOR) 20 MG tablet Take 1 tablet (20 mg total) by mouth daily. 11/16/21   Gwyneth Sprout, FNP  traZODone (DESYREL) 100 MG tablet Take 2 tablets (200 mg total) by mouth at bedtime. 10/25/21   Gwyneth Sprout, FNP  venlafaxine XR (EFFEXOR-XR) 150 MG 24 hr capsule Take 2 capsules (300 mg total) by mouth daily with breakfast. 10/25/21   Gwyneth Sprout, FNP  vitamin C (ASCORBIC ACID) 500 MG tablet Take 500 mg by mouth daily.    [provider]    Family History Family History  Problem Relation Age of Onset   Lung cancer Mother    Hypertension Mother     Alcohol abuse Mother    Anxiety disorder Mother    Hypertension Father    Arthritis Father    Alcohol abuse Father    Esophageal cancer Father    Alcohol abuse Brother    Hypertension Brother    Autism Son    Coronary artery disease Maternal Grandfather    Heart attack Maternal Grandfather    CVA Paternal Grandmother    Skin cancer Paternal Grandmother    Cancer Maternal Grandmother     Social History Social History   Tobacco Use   Smoking status: Never   Smokeless tobacco: Never  Vaping Use   Vaping Use: Never used  Substance Use Topics   Alcohol use: No    Alcohol/week: 0.0 standard drinks   Drug use: No     Allergies   Shellfish allergy   Review of Systems Review of  Systems  Constitutional:  Negative for chills and fever.  Cardiovascular:  Negative for palpitations.  Gastrointestinal:  Positive for abdominal pain. Negative for diarrhea and vomiting.  Genitourinary:  Positive for dysuria and frequency. Negative for flank pain, hematuria and vaginal discharge.  Skin:  Negative for color change and rash.  All other systems reviewed and are negative.   Physical Exam Triage Vital Signs ED Triage Vitals  Enc Vitals Group     BP      Pulse      Resp      Temp      Temp src      SpO2      Weight      Height      Head Circumference      Peak Flow      Pain Score      Pain Loc      Pain Edu?      Excl. in [place]?    No data found.  Updated Vital Signs BP 119/79 (BP Location: Left Arm)   Pulse 81   Temp 98.3 F (36.8 C) (Oral)   Resp 16   LMP 08/06/2018   SpO2 97%   Visual Acuity Right Eye Distance:   Left Eye Distance:   Bilateral Distance:    Right Eye Near:   Left Eye Near:    Bilateral Near:     Physical Exam Vitals and nursing note reviewed.  Constitutional:      General: She is not in acute distress.    Appearance: She is well-developed. She is obese. She is not ill-appearing.  HENT:     Mouth/Throat:     Mouth: Mucous membranes are  moist.  Cardiovascular:     Rate and Rhythm: Normal rate and regular rhythm.     Heart sounds: Normal heart sounds.  Pulmonary:     Effort: Pulmonary effort is normal. No respiratory distress.     Breath sounds: Normal breath sounds.  Abdominal:     Palpations: Abdomen is soft.     Tenderness: There is no abdominal tenderness. There is no right CVA tenderness, left CVA tenderness, guarding or rebound.  Musculoskeletal:     Cervical back: Neck supple.  Skin:    General: Skin is warm and dry.  Neurological:     Mental Status: She is alert.  Psychiatric:        Mood and Affect: Mood normal.        Behavior: Behavior normal.     UC Treatments / Results  Labs (all labs ordered are listed, but only abnormal results are displayed) Labs Reviewed  POCT URINALYSIS DIP (MANUAL ENTRY) - Abnormal; Notable for the following components:      Result Value   Bilirubin, UA small (*)    Ketones, POC UA trace (5) (*)    Blood, UA large (*)    Protein Ur, POC >=300 (*)    Leukocytes, UA Large (3+) (*)    All other components within normal limits  URINE CULTURE    EKG   Radiology No results found.  Procedures Procedures (including critical care time)  Medications Ordered in UC Medications - No data to display  Initial Impression / Assessment and Plan / UC Course  I have reviewed the triage vital signs and the nursing notes.  Pertinent labs & imaging results that were available during my care of the patient were reviewed by me and considered in my medical decision making (see chart for details).  Acute cystitis with hematuria.  Treating with Keflex. Urine culture pending. Discussed with patient that we will call her if the urine culture shows the need to change or discontinue the antibiotic. Instructed her to follow-up with her PCP next week for recheck. Patient agrees to plan of care.     Final Clinical Impressions(s) / UC Diagnoses   Final diagnoses:  Acute cystitis with  hematuria     Discharge Instructions      Take the antibiotic as directed.  The urine culture is pending.  We will call you if it shows the need to change or discontinue your antibiotic.    Follow up with your primary care provider if your symptoms are not improving.        ED Prescriptions     Medication Sig Dispense Auth. Provider   cephALEXin (KEFLEX) 500 MG capsule Take 1 capsule (500 mg total) by mouth 2 (two) times daily for 5 days. 10 capsule Sharion Balloon, NP      PDMP not reviewed this encounter.   Sharion Balloon, NP 06/03/22 1157

## 2022-06-05 LAB — URINE CULTURE: Culture: >=100000 — AB

## 2022-06-10 ENCOUNTER — Encounter: Payer: Self-pay | Admitting: Family Medicine

## 2022-06-11 NOTE — Progress Notes (Signed)
Established patient visit   Patient: Patricia Deleon   DOB: 1961-02-09   61 y.o. Female  MRN: [id number] Visit Date: 06/14/2022  Today's healthcare provider: Gwyneth Sprout, FNP  Re Introduced to nurse practitioner role and practice setting.  All questions answered.  Discussed provider/patient relationship and expectations.   I,Tiffany J Bragg,acting as a scribe for Gwyneth Sprout, FNP.,have documented all relevant documentation on the behalf of Gwyneth Sprout, FNP,as directed by  Gwyneth Sprout, FNP while in the presence of Gwyneth Sprout, FNP.   Chief Complaint  Patient presents with   Urinary Tract Infection   Subjective    HPI  Urinary symptoms  She reports recurrent  incontinence and urgency . The current episode started about a week ago and is gradually improving. Patient states symptoms are mild in intensity, occurring intermittently. She  has been recently treated for similar symptoms.    Associated symptoms: No abdominal pain No back pain  No chills No constipation  No cramping No diarrhea  No discharge No fever  No hematuria Yes nausea  No vomiting    ---------------------------------------------------------------------------------------   Medications: Outpatient Medications Prior to Visit  Medication Sig   ARIPiprazole (ABILIFY) 10 MG tablet Take 1 tablet (10 mg total) by mouth daily.   Armodafinil (NUVIGIL) 200 MG TABS One po qAM   aspirin 81 MG chewable tablet Chew 81 mg by mouth daily.   baclofen (LIORESAL) 10 MG tablet Take a half tablet to 2 tablets by mouth at bedtime for nighttime spasticity   Cholecalciferol (VITAMIN D3) 2000 UNITS capsule Take 4,000 Units by mouth daily.    Ferrous Sulfate Dried (HM SLOW RELEASE IRON) 45 MG TBCR Take by mouth.   gabapentin (NEURONTIN) 100 MG capsule Take 1 capsule (100 mg total) by mouth 2 (two) times daily.   Glatiramer Acetate 40 MG/ML SOSY Inject into the skin.   Glucosamine Sulfate-MSM (MSM/GLUCOSAMINE)  250-250 MG CAPS Take 1 tablet by mouth 2 (two) times daily.    ketoconazole (NIZORAL) 2 % cream Apply 1 application topically daily.   ketoconazole (NIZORAL) 2 % shampoo APPLY 1 APPLICATION TOPICALLY 2 (TWO) TIMES A WEEK.   levothyroxine (SYNTHROID) 150 MCG tablet TAKE 1 TABLET BY MOUTH DAILY BEFORE BREAKFAST.   losartan-hydrochlorothiazide (HYZAAR) 50-12.5 MG tablet Take 1 tablet by mouth daily.   magic mouthwash (nystatin, hydrocortisone, diphenhydrAMINE) suspension Swish and swallow 10 mLs 4 (four) times daily as needed for mouth pain.   Omega-3 Fatty Acids (FISH OIL) 500 MG CAPS Take by mouth.   pantoprazole (PROTONIX) 40 MG tablet Take 1 tablet (40 mg total) by mouth 2 (two) times daily before a meal.   polyethylene glycol powder (GLYCOLAX/MIRALAX) 17 GM/SCOOP powder Take 17 g by mouth at bedtime.    rosuvastatin (CRESTOR) 20 MG tablet Take 1 tablet (20 mg total) by mouth daily.   traZODone (DESYREL) 100 MG tablet Take 2 tablets (200 mg total) by mouth at bedtime.   venlafaxine XR (EFFEXOR-XR) 150 MG 24 hr capsule Take 2 capsules (300 mg total) by mouth daily with breakfast.   vitamin C (ASCORBIC ACID) 500 MG tablet Take 500 mg by mouth daily.   No facility-administered medications prior to visit.    Review of Systems     Objective    BP (!) 136/58 (BP Location: Right Arm, Patient Position: Sitting, Cuff Size: Large)   Pulse 70   Temp 97.7 F (36.5 C) (Oral)   Resp 16   Ht  $'5\' 6"'b$  (1.676 m)   Wt (!) 303 lb (137.4 kg)   LMP 08/06/2018   SpO2 98%   BMI 48.91 kg/m    Physical Exam Vitals and nursing note reviewed.  Constitutional:      General: She is not in acute distress.    Appearance: Normal appearance. She is obese. She is not ill-appearing, toxic-appearing or diaphoretic.  HENT:     Head: Normocephalic and atraumatic.  Cardiovascular:     Rate and Rhythm: Normal rate and regular rhythm.     Pulses: Normal pulses.     Heart sounds: Normal heart sounds. No murmur  heard.    No friction rub. No gallop.  Pulmonary:     Effort: Pulmonary effort is normal. No respiratory distress.     Breath sounds: Normal breath sounds. No stridor. No wheezing, rhonchi or rales.  Chest:     Chest wall: No tenderness.  Musculoskeletal:        General: No swelling, tenderness, deformity or signs of injury. Normal range of motion.     Right lower leg: No edema.     Left lower leg: No edema.  Skin:    General: Skin is warm and dry.     Capillary Refill: Capillary refill takes less than 2 seconds.     Coloration: Skin is not jaundiced or pale.     Findings: No bruising, erythema, lesion or rash.  Neurological:     General: No focal deficit present.     Mental Status: She is alert and oriented to person, place, and time. Mental status is at baseline.     Cranial Nerves: No cranial nerve deficit.     Sensory: No sensory deficit.     Motor: No weakness.     Coordination: Coordination normal.  Psychiatric:        Mood and Affect: Mood normal.        Behavior: Behavior normal.        Thought Content: Thought content normal.        Judgment: Judgment normal.       Results for orders placed or performed in visit on 06/14/22  POCT urinalysis dipstick  Result Value Ref Range   Color, UA Yellow    Clarity, UA Clear    Glucose, UA Negative Negative   Bilirubin, UA Negative    Ketones, UA Negative    Spec Grav, UA <=1.005 (A) 1.010 - 1.025   Blood, UA Negative    pH, UA 6.5 5.0 - 8.0   Protein, UA Negative Negative   Urobilinogen, UA 0.2 0.2 or 1.0 E.U./dL   Nitrite, UA Negative    Leukocytes, UA Negative Negative   Appearance     Odor      Assessment & Plan     Problem List Items Addressed This Visit       Other   UTI symptoms - Primary    Acute, recurrent  UA negative; however, may be over dilute given water intake  Will send for culture Trial of ditropan for OAB Encourage use of AZO for symptoms       Relevant Medications   oxybutynin (DITROPAN  XL) 10 MG 24 hr tablet   phenazopyridine (PYRIDIUM) 97 MG tablet   Other Relevant Orders   POCT urinalysis dipstick (Completed)   Urine Culture     Return if symptoms worsen or fail to improve.      Vonna Kotyk, FNP, have reviewed all documentation for this visit.  The documentation on 06/14/22 for the exam, diagnosis, procedures, and orders are all accurate and complete.    Gwyneth Sprout, [place] [phone] (phone) [phone] (fax)  [place]

## 2022-06-14 ENCOUNTER — Encounter: Payer: Self-pay | Admitting: Family Medicine

## 2022-06-14 ENCOUNTER — Ambulatory Visit: Payer: BC Managed Care – PPO | Admitting: Family Medicine

## 2022-06-14 VITALS — BP 136/58 | HR 70 | Temp 97.7°F | Resp 16 | Ht 66.0 in | Wt 303.0 lb

## 2022-06-14 DIAGNOSIS — R399 Unspecified symptoms and signs involving the genitourinary system: Secondary | ICD-10-CM | POA: Diagnosis not present

## 2022-06-14 LAB — POCT URINALYSIS DIPSTICK
Bilirubin, UA: NEGATIVE
Blood, UA: NEGATIVE
Glucose, UA: NEGATIVE
Ketones, UA: NEGATIVE
Leukocytes, UA: NEGATIVE
Nitrite, UA: NEGATIVE
Protein, UA: NEGATIVE
Spec Grav, UA: <=1.005 — AB (ref 1.010–1.025)
Urobilinogen, UA: 0.2 E.U./dL
pH, UA: 6.5 (ref 5.0–8.0)

## 2022-06-14 MED ORDER — OXYBUTYNIN CHLORIDE ER 10 MG PO TB24: 10.0000 mg | ORAL_TABLET | Freq: Every day | ORAL | 0 refills | Status: DC

## 2022-06-14 MED ORDER — PHENAZOPYRIDINE HCL 97.2 MG PO TABS: 97.0000 mg | ORAL_TABLET | Freq: Three times a day (TID) | ORAL | 0 refills | Status: DC | PRN

## 2022-06-14 NOTE — Assessment & Plan Note (Signed)
Acute, recurrent  UA negative; however, may be over dilute given water intake  Will send for culture Trial of ditropan for OAB Encourage use of AZO for symptoms

## 2022-06-17 LAB — URINE CULTURE

## 2022-06-17 LAB — SPECIMEN STATUS REPORT

## 2022-06-19 NOTE — Progress Notes (Signed)
No single bacteria was seen on urine culture.  Please let us know if you have any questions.  Thank you, Gwyneth Sprout, [address] [place], [place] [address] [phone] (phone) [phone] (fax) [place]

## 2022-06-25 ENCOUNTER — Ambulatory Visit: Payer: Self-pay

## 2022-06-26 ENCOUNTER — Encounter: Payer: Self-pay | Admitting: Family Medicine

## 2022-06-26 ENCOUNTER — Ambulatory Visit (INDEPENDENT_AMBULATORY_CARE_PROVIDER_SITE_OTHER): Payer: BC Managed Care – PPO | Admitting: Family Medicine

## 2022-06-26 VITALS — BP 108/72 | HR 92 | Temp 98.5°F | Resp 16 | Ht 66.0 in | Wt 290.6 lb

## 2022-06-26 DIAGNOSIS — R319 Hematuria, unspecified: Secondary | ICD-10-CM | POA: Diagnosis not present

## 2022-06-26 DIAGNOSIS — R399 Unspecified symptoms and signs involving the genitourinary system: Secondary | ICD-10-CM | POA: Diagnosis not present

## 2022-06-26 DIAGNOSIS — N390 Urinary tract infection, site not specified: Secondary | ICD-10-CM | POA: Diagnosis not present

## 2022-06-26 LAB — POCT URINALYSIS DIPSTICK
Bilirubin, UA: NEGATIVE
Glucose, UA: NEGATIVE
Ketones, UA: NEGATIVE
Nitrite, UA: NEGATIVE
Protein, UA: NEGATIVE
Spec Grav, UA: 1.01 (ref 1.010–1.025)
Urobilinogen, UA: 0.2 E.U./dL
pH, UA: 6 (ref 5.0–8.0)

## 2022-06-26 MED ORDER — NITROFURANTOIN MONOHYD MACRO 100 MG PO CAPS: 100.0000 mg | ORAL_CAPSULE | Freq: Two times a day (BID) | ORAL | 0 refills | Status: AC

## 2022-06-26 NOTE — Assessment & Plan Note (Signed)
UA consistent with infection. Rx macrobid x5 days. Will send for culture, if again culture positive, recommend urology evaluation.

## 2022-06-26 NOTE — Progress Notes (Addendum)
   SUBJECTIVE:   CHIEF COMPLAINT / HPI:   URINARY SYMPTOMS - 6/4 with Ecoli UTI, rx keflex. Seen 6/15 for persistent sx. Ucx with insignificant growth. Gave oxybutynin for possible OAB.  - taking oxybutynin occasionally, better without it. - got worse after finishing antibiotic for 6/4 UTI.   Dysuria: yes at end of stream Urinary frequency: yes Urgency: yes Urinary incontinence: yes Hematuria: yes Suprapubic pain/pressure: yes Fever:  no Vomiting: no, some nausea Relief with pyridium: yes Previous urinary tract infection: yes Recurrent urinary tract infection: yes Vaginal discharge: no Treatments attempted: AZO, oxybutynin    OBJECTIVE:   BP 108/72 (BP Location: Left Arm, Patient Position: Sitting, Cuff Size: Large)   Pulse 92   Temp 98.5 F (36.9 C) (Oral)   Resp 16   Ht '5\' 6"'$  (1.676 m)   Wt 290 lb 9.6 oz (131.8 kg)   LMP 08/06/2018   BMI 46.90 kg/m   Gen: well appearing, in NAD Card: reg rate Lungs: Comfortable WOB on RA Ext: WWP, no edema   ASSESSMENT/PLAN:   UTI symptoms UA consistent with infection. Rx macrobid x5 days. Will send for culture, if again culture positive, recommend urology evaluation.      Myles Gip, DO

## 2022-06-27 LAB — URINALYSIS, MICROSCOPIC ONLY
Casts: NONE SEEN /lpf
WBC, UA: >30 /hpf — AB (ref 0–5)

## 2022-06-27 LAB — SPECIMEN STATUS REPORT

## 2022-07-02 LAB — URINE CULTURE

## 2022-07-02 LAB — SPECIMEN STATUS REPORT

## 2022-07-02 NOTE — Addendum Note (Signed)
Addended by: Myles Gip on: 07/02/2022 12:27 PM   Modules accepted: Orders

## 2022-07-08 ENCOUNTER — Other Ambulatory Visit: Payer: Self-pay | Admitting: Family Medicine

## 2022-07-08 DIAGNOSIS — R399 Unspecified symptoms and signs involving the genitourinary system: Secondary | ICD-10-CM

## 2022-07-10 NOTE — Progress Notes (Unsigned)
Established patient visit   Patient: Patricia Deleon   DOB: 28-Oct-1961   61 y.o. Female  MRN: [id number] Visit Date: 07/11/2022  Today's healthcare provider: Gwyneth Sprout, FNP  Introduced to nurse practitioner role and practice setting.  All questions answered.  Discussed provider/patient relationship and expectations.   I,Jaysun Wessels J Uzziah Rigg,acting as a scribe for Gwyneth Sprout, FNP.,have documented all relevant documentation on the behalf of Gwyneth Sprout, FNP,as directed by  Gwyneth Sprout, FNP while in the presence of Gwyneth Sprout, FNP.   Chief Complaint  Patient presents with   Urinary Tract Infection    Patient complains of continued UTI symptoms since last visit. Nausea, fever, bloating and lower abdominal pain.    Subjective    HPI HPI     Urinary Tract Infection    Additional comments: Patient complains of continued UTI symptoms since last visit. Nausea, fever, bloating and lower abdominal pain.       Last edited by Smitty Knudsen, CMA on 07/11/2022 10:39 AM.      Urinary symptoms  She reports recurrent cloudy malodorous urine, dysuria, flank pain, urinary frequency, urinary hesitancy, and urinary urgency. The current episode started a few months ago and is gradually worsening. Patient states symptoms are 7/10 in intensity, occurring constantly. She  has been recently treated for similar symptoms.    Associated symptoms: Yes abdominal pain No back pain  No chills No constipation  No cramping No diarrhea  No discharge No fever  No hematuria Yes nausea  No vomiting    ---------------------------------------------------------------------------------------   Medications: Outpatient Medications Prior to Visit  Medication Sig   ARIPiprazole (ABILIFY) 10 MG tablet Take 1 tablet (10 mg total) by mouth daily.   Armodafinil (NUVIGIL) 200 MG TABS One po qAM   aspirin 81 MG chewable tablet Chew 81 mg by mouth daily.   baclofen (LIORESAL) 10 MG tablet Take a  half tablet to 2 tablets by mouth at bedtime for nighttime spasticity   Cholecalciferol (VITAMIN D3) 2000 UNITS capsule Take 4,000 Units by mouth daily.    Ferrous Sulfate Dried (HM SLOW RELEASE IRON) 45 MG TBCR Take by mouth.   gabapentin (NEURONTIN) 100 MG capsule Take 1 capsule (100 mg total) by mouth 2 (two) times daily.   Glatiramer Acetate 40 MG/ML SOSY Inject into the skin.   Glucosamine Sulfate-MSM (MSM/GLUCOSAMINE) 250-250 MG CAPS Take 1 tablet by mouth 2 (two) times daily.    ketoconazole (NIZORAL) 2 % cream Apply 1 application topically daily.   ketoconazole (NIZORAL) 2 % shampoo APPLY 1 APPLICATION TOPICALLY 2 (TWO) TIMES A WEEK.   levothyroxine (SYNTHROID) 150 MCG tablet TAKE 1 TABLET BY MOUTH DAILY BEFORE BREAKFAST.   losartan-hydrochlorothiazide (HYZAAR) 50-12.5 MG tablet Take 1 tablet by mouth daily.   magic mouthwash (nystatin, hydrocortisone, diphenhydrAMINE) suspension Swish and swallow 10 mLs 4 (four) times daily as needed for mouth pain.   Omega-3 Fatty Acids (FISH OIL) 500 MG CAPS Take by mouth.   oxybutynin (DITROPAN-XL) 10 MG 24 hr tablet Take 1 tablet (10 mg total) by mouth at bedtime.   pantoprazole (PROTONIX) 40 MG tablet Take 1 tablet (40 mg total) by mouth 2 (two) times daily before a meal.   phenazopyridine (PYRIDIUM) 97 MG tablet Take 1 tablet (97 mg total) by mouth 3 (three) times daily as needed for pain.   polyethylene glycol powder (GLYCOLAX/MIRALAX) 17 GM/SCOOP powder Take 17 g by mouth at bedtime.    rosuvastatin (CRESTOR)  20 MG tablet Take 1 tablet (20 mg total) by mouth daily.   traZODone (DESYREL) 100 MG tablet Take 2 tablets (200 mg total) by mouth at bedtime.   venlafaxine XR (EFFEXOR-XR) 150 MG 24 hr capsule Take 2 capsules (300 mg total) by mouth daily with breakfast.   vitamin C (ASCORBIC ACID) 500 MG tablet Take 500 mg by mouth daily.   No facility-administered medications prior to visit.    Review of Systems     Objective    BP 112/68 (BP  Location: Left Arm, Patient Position: Sitting, Cuff Size: Large)   Pulse (!) 103   Temp 98.4 F (36.9 C) (Oral)   Resp 16   Wt 286 lb (129.7 kg)   LMP 08/06/2018   SpO2 95%   BMI 46.16 kg/m    Physical Exam Vitals and nursing note reviewed.  Constitutional:      General: She is not in acute distress.    Appearance: Normal appearance. She is obese. She is not ill-appearing, toxic-appearing or diaphoretic.  HENT:     Head: Normocephalic and atraumatic.  Cardiovascular:     Rate and Rhythm: Regular rhythm. Tachycardia present.     Pulses: Normal pulses.     Heart sounds: Normal heart sounds. No murmur heard.    No friction rub. No gallop.  Pulmonary:     Effort: Pulmonary effort is normal. No respiratory distress.     Breath sounds: Normal breath sounds. No stridor. No wheezing, rhonchi or rales.  Chest:     Chest wall: No tenderness.  Abdominal:     General: Bowel sounds are normal.     Palpations: Abdomen is soft.     Tenderness: There is abdominal tenderness.     Comments: LLQ tenderness   Musculoskeletal:        General: No swelling, tenderness, deformity or signs of injury. Normal range of motion.     Right lower leg: No edema.     Left lower leg: No edema.  Skin:    General: Skin is warm and dry.     Capillary Refill: Capillary refill takes less than 2 seconds.     Coloration: Skin is pale. Skin is not jaundiced.     Findings: No bruising, erythema, lesion or rash.     Comments: Hands with slight tenting d/t dehydration   Neurological:     General: No focal deficit present.     Mental Status: She is alert and oriented to person, place, and time. Mental status is at baseline.     Cranial Nerves: No cranial nerve deficit.     Sensory: No sensory deficit.     Motor: No weakness.     Coordination: Coordination normal.  Psychiatric:        Mood and Affect: Mood normal.        Behavior: Behavior normal.        Thought Content: Thought content normal.         Judgment: Judgment normal.      Results for orders placed or performed in visit on 07/11/22  POCT Urinalysis Dipstick  Result Value Ref Range   Color, UA yellow    Clarity, UA cloudy    Glucose, UA Negative Negative   Bilirubin, UA positive    Ketones, UA positive    Spec Grav, UA 1.015 1.010 - 1.025   Blood, UA positive    pH, UA 6.0 5.0 - 8.0   Protein, UA Positive (A) Negative   Urobilinogen,  UA 1.0 0.2 or 1.0 E.U./dL   Nitrite, UA negative    Leukocytes, UA Moderate (2+) (A) Negative   Appearance     Odor      Assessment & Plan     Problem List Items Addressed This Visit       Genitourinary   Urinary tract infection, recurrent - Primary    Acute, symptomatic, recurrent Has had poor appetite d/t nausea Slight tenting of hands noted d/t poor PO intake; refuses ER at this time  Strict emergent precautions discussed with patient including fevering, vomiting, shaking, inability to eat/drink worsening  Use of new Abx, based on previous urine culture, extending time line Upcoming appt with urology Continue to push fluids; advise to abandon diet and focus on hydration and nutrition given acute/recurrent illness to prevent sepsis Work note provided        Relevant Medications   sulfamethoxazole-trimethoprim (BACTRIM DS) 800-160 MG tablet   prochlorperazine (COMPAZINE) 10 MG tablet   Other Relevant Orders   POCT Urinalysis Dipstick (Completed)    Return if symptoms worsen or fail to improve.      Vonna Kotyk, FNP, have reviewed all documentation for this visit. The documentation on 07/11/22 for the exam, diagnosis, procedures, and orders are all accurate and complete.    Gwyneth Sprout, [place] [phone] (phone) [phone] (fax)  [place]

## 2022-07-11 ENCOUNTER — Ambulatory Visit (INDEPENDENT_AMBULATORY_CARE_PROVIDER_SITE_OTHER): Payer: BC Managed Care – PPO | Admitting: Family Medicine

## 2022-07-11 ENCOUNTER — Encounter: Payer: Self-pay | Admitting: Family Medicine

## 2022-07-11 VITALS — BP 112/68 | HR 103 | Temp 98.4°F | Resp 16 | Wt 286.0 lb

## 2022-07-11 DIAGNOSIS — N390 Urinary tract infection, site not specified: Secondary | ICD-10-CM

## 2022-07-11 LAB — POCT URINALYSIS DIPSTICK
Bilirubin, UA: POSITIVE
Blood, UA: POSITIVE
Glucose, UA: NEGATIVE
Ketones, UA: POSITIVE
Nitrite, UA: NEGATIVE
Protein, UA: POSITIVE — AB
Spec Grav, UA: 1.015 (ref 1.010–1.025)
Urobilinogen, UA: 1 E.U./dL
pH, UA: 6 (ref 5.0–8.0)

## 2022-07-11 MED ORDER — PROCHLORPERAZINE MALEATE 10 MG PO TABS: 10.0000 mg | ORAL_TABLET | Freq: Four times a day (QID) | ORAL | 0 refills | Status: DC

## 2022-07-11 MED ORDER — SULFAMETHOXAZOLE-TRIMETHOPRIM 800-160 MG PO TABS: 1.0000 | ORAL_TABLET | Freq: Two times a day (BID) | ORAL | 0 refills | Status: DC

## 2022-07-11 NOTE — Assessment & Plan Note (Signed)
Acute, symptomatic, recurrent Has had poor appetite d/t nausea Slight tenting of hands noted d/t poor PO intake; refuses ER at this time  Strict emergent precautions discussed with patient including fevering, vomiting, shaking, inability to eat/drink worsening  Use of new Abx, based on previous urine culture, extending time line Upcoming appt with urology Continue to push fluids; advise to abandon diet and focus on hydration and nutrition given acute/recurrent illness to prevent sepsis Work note provided

## 2022-07-11 NOTE — Addendum Note (Signed)
Addended by: Smitty Knudsen on: 07/11/2022 03:00 PM   Modules accepted: Orders

## 2022-07-12 DIAGNOSIS — N390 Urinary tract infection, site not specified: Secondary | ICD-10-CM | POA: Diagnosis not present

## 2022-07-16 LAB — URINE CULTURE

## 2022-07-16 LAB — SPECIMEN STATUS REPORT

## 2022-07-17 NOTE — Progress Notes (Signed)
Ecoli remains in UTI; antibiotic should cover. Please continue to follow up with urology. Gwyneth Sprout, [address] [place], [place] [address] [phone] (phone) [phone] (fax) [place]

## 2022-07-20 ENCOUNTER — Ambulatory Visit: Payer: BC Managed Care – PPO | Admitting: Urology

## 2022-07-20 ENCOUNTER — Encounter: Payer: Self-pay | Admitting: Urology

## 2022-07-20 VITALS — BP 122/80 | HR 79 | Ht 66.0 in | Wt 288.4 lb

## 2022-07-20 DIAGNOSIS — R8281 Pyuria: Secondary | ICD-10-CM

## 2022-07-20 DIAGNOSIS — Z87440 Personal history of urinary (tract) infections: Secondary | ICD-10-CM | POA: Diagnosis not present

## 2022-07-20 DIAGNOSIS — N390 Urinary tract infection, site not specified: Secondary | ICD-10-CM

## 2022-07-20 LAB — MICROSCOPIC EXAMINATION

## 2022-07-20 LAB — URINALYSIS, COMPLETE
Bilirubin, UA: NEGATIVE
Glucose, UA: NEGATIVE
Ketones, UA: NEGATIVE
Nitrite, UA: NEGATIVE
Protein,UA: NEGATIVE
Specific Gravity, UA: 1.01 (ref 1.005–1.030)
Urobilinogen, Ur: 0.2 mg/dL (ref 0.2–1.0)
pH, UA: 5.5 (ref 5.0–7.5)

## 2022-07-20 MED ORDER — SULFAMETHOXAZOLE-TRIMETHOPRIM 400-80 MG PO TABS: ORAL_TABLET | ORAL | 0 refills | Status: DC

## 2022-07-20 NOTE — Progress Notes (Signed)
07/20/2022 2:22 PM   Patricia Deleon [name] [birth year] [id number]  Referring provider: Gwyneth Sprout, [address] [place],  [place] [address]  Chief Complaint  Patient presents with   Recurrent UTI    HPI: Patricia Deleon is a 61 y.o. female referred for evaluation of recurrent UTI.  Four culture documented UTIs (E. coli) since March 2023 Seen [facility] 06/03/2022 with complaints of dysuria, frequency and bladder pressure.  Dipstick UA with large blood and leukocytes.  Treated with a 5-day course of Keflex 500 mg twice daily.  Urine culture grew E. coli resistant to ampicillin and intermediate to Unasyn Saw PCP 06/14/2022 complaining of recurrent symptoms.  Urinalysis was negative and she was treated with oxybutynin and Azo.  Urine culture was repeated which grew mixed flora Repeat PCP visit 06/26/2022 for dysuria, frequency, urgency, urge incontinence and bladder pressure.  UA with >30 WBC and she was treated with a 5-day course of Macrobid.  Urine culture grew E. coli resistant to only ampicillin Repeat PCP visit 07/11/2022 with persistent voiding symptoms since prior visit.  Treated with a 10-day course of Septra DS.  Urine culture + E. coli resistant to ampicillin She has almost completed her antibiotic and symptoms have resolved   PMH: Past Medical History:  Diagnosis Date   Agoraphobia with panic disorder    Anemia    Depression    Erosive gastritis    Fundic gland polyps of stomach, benign    Geographic tongue    GERD (gastroesophageal reflux disease)    History of esophagogastroduodenoscopy (EGD)    Hypercholesterolemia    Hypertension    Intervertebral disc disorder    thoracic,thoracolumber,lumbosacral   Multiple sclerosis ([place])    Obesity    Sleep apnea     Surgical History: Past Surgical History:  Procedure Laterality Date   COLONOSCOPY     COLONOSCOPY WITH PROPOFOL N/A 07/07/2018   Procedure: COLONOSCOPY WITH PROPOFOL;  Surgeon: Lollie Sails, MD;  [facility] ENDOSCOPY;  Service: Endoscopy;  Laterality: N/A;   ESOPHAGOGASTRODUODENOSCOPY (EGD) WITH PROPOFOL N/A 01/31/2016   Procedure: ESOPHAGOGASTRODUODENOSCOPY (EGD) WITH PROPOFOL;  Surgeon: Lollie Sails, MD;  Location: [facility] ENDOSCOPY;  Service: Endoscopy;  Laterality: N/A;   IR FLUORO GUIDE CV LINE RIGHT  10/06/2018   IR REMOVAL TUN CV CATH W/O FL  10/15/2018   IR US GUIDE VASC ACCESS RIGHT  65/0/3546   NISSEN FUNDOPLICATION      Home Medications:  Allergies as of 07/20/2022       Reactions   Shellfish Allergy Hives        Medication List        Accurate as of July 20, 2022 11:59 PM. If you have any questions, ask your nurse or doctor.          ARIPiprazole 10 MG tablet Commonly known as: ABILIFY Take 1 tablet (10 mg total) by mouth daily.   Armodafinil 200 MG Tabs Commonly known as: Nuvigil One po qAM   aspirin 81 MG chewable tablet Chew 81 mg by mouth daily.   baclofen 10 MG tablet Commonly known as: LIORESAL Take a half tablet to 2 tablets by mouth at bedtime for nighttime spasticity   Fish Oil 500 MG Caps Take by mouth.   gabapentin 100 MG capsule Commonly known as: NEURONTIN Take 1 capsule (100 mg total) by mouth 2 (two) times daily.   Glatiramer Acetate 40 MG/ML Sosy Inject into the skin.   HM Slow Release Iron  45 MG Tbcr Generic drug: Ferrous Sulfate Dried Take by mouth.   ketoconazole 2 % shampoo Commonly known as: NIZORAL APPLY 1 APPLICATION TOPICALLY 2 (TWO) TIMES A WEEK.   ketoconazole 2 % cream Commonly known as: NIZORAL Apply 1 application topically daily.   levothyroxine 150 MCG tablet Commonly known as: SYNTHROID TAKE 1 TABLET BY MOUTH DAILY BEFORE BREAKFAST.   losartan-hydrochlorothiazide 50-12.5 MG tablet Commonly known as: HYZAAR Take 1 tablet by mouth daily.   magic mouthwash (nystatin, hydrocortisone, diphenhydrAMINE) suspension Swish and swallow 10 mLs 4 (four) times daily as needed for mouth  pain.   MSM/Glucosamine 250-250 MG Caps Take 1 tablet by mouth 2 (two) times daily.   oxybutynin 10 MG 24 hr tablet Commonly known as: DITROPAN-XL Take 1 tablet (10 mg total) by mouth at bedtime.   pantoprazole 40 MG tablet Commonly known as: PROTONIX Take 1 tablet (40 mg total) by mouth 2 (two) times daily before a meal.   phenazopyridine 97 MG tablet Commonly known as: PYRIDIUM Take 1 tablet (97 mg total) by mouth 3 (three) times daily as needed for pain.   polyethylene glycol powder 17 GM/SCOOP powder Commonly known as: GLYCOLAX/MIRALAX Take 17 g by mouth at bedtime.   prochlorperazine 10 MG tablet Commonly known as: COMPAZINE Take 1 tablet (10 mg total) by mouth in the morning, at noon, in the evening, and at bedtime.   rosuvastatin 20 MG tablet Commonly known as: Crestor Take 1 tablet (20 mg total) by mouth daily.   sulfamethoxazole-trimethoprim 800-160 MG tablet Commonly known as: BACTRIM DS Take 1 tablet by mouth 2 (two) times daily. What changed: Another medication with the same name was added. Make sure you understand how and when to take each. Changed by: Abbie Sons, MD   sulfamethoxazole-trimethoprim 400-80 MG tablet Commonly known as: BACTRIM 1 tablet daily to start after current antibiotic course completed What changed: You were already taking a medication with the same name, and this prescription was added. Make sure you understand how and when to take each. Changed by: Abbie Sons, MD   traZODone 100 MG tablet Commonly known as: DESYREL Take 2 tablets (200 mg total) by mouth at bedtime.   venlafaxine XR 150 MG 24 hr capsule Commonly known as: EFFEXOR-XR Take 2 capsules (300 mg total) by mouth daily with breakfast.   vitamin C 500 MG tablet Commonly known as: ASCORBIC ACID Take 500 mg by mouth daily.   Vitamin D3 50 MCG (2000 UT) capsule Take 4,000 Units by mouth daily.        Allergies:  Allergies  Allergen Reactions   Shellfish  Allergy Hives    Family History: Family History  Problem Relation Age of Onset   Lung cancer Mother    Hypertension Mother    Alcohol abuse Mother    Anxiety disorder Mother    Hypertension Father    Arthritis Father    Alcohol abuse Father    Esophageal cancer Father    Alcohol abuse Brother    Hypertension Brother    Autism Son    Coronary artery disease Maternal Grandfather    Heart attack Maternal Grandfather    CVA Paternal Grandmother    Skin cancer Paternal Grandmother    Cancer Maternal Grandmother     Social History:  reports that she has never smoked. She has never used smokeless tobacco. She reports that she does not drink alcohol and does not use drugs.   Physical Exam: BP 122/80 (BP Location: Left  Arm, Patient Position: Sitting, Cuff Size: Large)   Pulse 79   Ht '5\' 6"'$  (1.676 m)   Wt 288 lb 6.4 oz (130.8 kg)   LMP 08/06/2018   BMI 46.55 kg/m   Constitutional:  Alert and oriented, No acute distress. HEENT: [place] AT Respiratory: Normal respiratory effort, no increased work of breathing. Psychiatric: Normal mood and affect.  Laboratory Data:  Urinalysis Dipstick trace blood/trace leukocytes Microscopy 6-10 WBC   Assessment & Plan:    1.  Urinary tract infection Most likely bacterial persistence due to inadequate treatment and not recurrent Presently asymptomatic.  UA with mild pyuria and urine culture was repeated Will place on additional 2 weeks of low-dose prophylaxis.  Rx single strength Septra sent to pharmacy Will call with culture results   Abbie Sons, [facility] [address], [place], [place] [address] [phone]

## 2022-07-21 ENCOUNTER — Encounter: Payer: Self-pay | Admitting: Urology

## 2022-07-25 LAB — CULTURE, URINE COMPREHENSIVE

## 2022-07-26 ENCOUNTER — Encounter: Payer: Self-pay | Admitting: *Deleted

## 2022-08-08 DIAGNOSIS — Z79899 Other long term (current) drug therapy: Secondary | ICD-10-CM | POA: Diagnosis not present

## 2022-08-08 DIAGNOSIS — F5104 Psychophysiologic insomnia: Secondary | ICD-10-CM | POA: Diagnosis not present

## 2022-08-08 DIAGNOSIS — F331 Major depressive disorder, recurrent, moderate: Secondary | ICD-10-CM | POA: Diagnosis not present

## 2022-08-16 ENCOUNTER — Other Ambulatory Visit: Payer: Self-pay | Admitting: Family Medicine

## 2022-08-16 DIAGNOSIS — N390 Urinary tract infection, site not specified: Secondary | ICD-10-CM

## 2022-10-06 ENCOUNTER — Other Ambulatory Visit: Payer: Self-pay | Admitting: Family Medicine

## 2022-10-06 DIAGNOSIS — G4700 Insomnia, unspecified: Secondary | ICD-10-CM

## 2022-10-29 ENCOUNTER — Other Ambulatory Visit: Payer: Self-pay | Admitting: Family Medicine

## 2022-11-07 ENCOUNTER — Other Ambulatory Visit: Payer: Self-pay | Admitting: Family Medicine

## 2022-11-07 DIAGNOSIS — N390 Urinary tract infection, site not specified: Secondary | ICD-10-CM

## 2022-12-12 ENCOUNTER — Other Ambulatory Visit: Payer: Self-pay | Admitting: Family Medicine

## 2022-12-12 ENCOUNTER — Encounter: Payer: Self-pay | Admitting: Family Medicine

## 2022-12-12 DIAGNOSIS — I10 Essential (primary) hypertension: Secondary | ICD-10-CM

## 2022-12-12 DIAGNOSIS — M5431 Sciatica, right side: Secondary | ICD-10-CM

## 2022-12-12 DIAGNOSIS — E039 Hypothyroidism, unspecified: Secondary | ICD-10-CM

## 2022-12-12 DIAGNOSIS — R7303 Prediabetes: Secondary | ICD-10-CM

## 2022-12-12 DIAGNOSIS — E7800 Pure hypercholesterolemia, unspecified: Secondary | ICD-10-CM

## 2022-12-12 DIAGNOSIS — G4700 Insomnia, unspecified: Secondary | ICD-10-CM

## 2023-01-01 ENCOUNTER — Other Ambulatory Visit: Payer: Self-pay | Admitting: Family Medicine

## 2023-01-01 DIAGNOSIS — K219 Gastro-esophageal reflux disease without esophagitis: Secondary | ICD-10-CM

## 2023-01-11 ENCOUNTER — Other Ambulatory Visit: Payer: Self-pay | Admitting: Family Medicine

## 2023-01-11 DIAGNOSIS — N390 Urinary tract infection, site not specified: Secondary | ICD-10-CM

## 2023-01-11 NOTE — Telephone Encounter (Signed)
Requested medication (s) are due for refill today - yes  Requested medication (s) are on the active medication list -yes  Future visit scheduled -no  Last refill: 11/07/22 #120  Notes to clinic: non delegated Rx  Requested Prescriptions  Pending Prescriptions Disp Refills   prochlorperazine (COMPAZINE) 10 MG tablet [Pharmacy Med Name: PROCHLORPERAZINE 10 MG TAB] 120 tablet 0    Sig: TAKE 1 TABLET (10 MG TOTAL) BY MOUTH IN THE MORNING, AT NOON, IN THE EVENING, AND AT BEDTIME.     Not Delegated - Gastroenterology: Antiemetics Failed - 01/11/2023  2:50 PM      Failed - This refill cannot be delegated      Failed - Valid encounter within last 6 months    Recent Outpatient Visits           6 months ago Urinary tract infection, recurrent   [facility] Tally Joe T, FNP   6 months ago Urinary tract infection, recurrent   [place], DO   7 months ago UTI symptoms   [facility] Tally Joe T, FNP   10 months ago Urinary tract infection with hematuria, site unspecified   [employer], Dani Gobble, PA-C   1 year ago Flu vaccine need   [facility] Tally Joe T, FNP                 Requested Prescriptions  Pending Prescriptions Disp Refills   prochlorperazine (COMPAZINE) 10 MG tablet [Pharmacy Med Name: PROCHLORPERAZINE 10 MG TAB] 120 tablet 0    Sig: TAKE 1 TABLET (10 MG TOTAL) BY MOUTH IN THE MORNING, AT NOON, IN THE EVENING, AND AT BEDTIME.     Not Delegated - Gastroenterology: Antiemetics Failed - 01/11/2023  2:50 PM      Failed - This refill cannot be delegated      Failed - Valid encounter within last 6 months    Recent Outpatient Visits           6 months ago Urinary tract infection, recurrent   [facility] Gwyneth Sprout, FNP   6 months ago Urinary tract infection, recurrent   [place], DO   7 months ago UTI symptoms    [facility] Tally Joe T, FNP   10 months ago Urinary tract infection with hematuria, site unspecified   [employer], Dani Gobble, PA-C   1 year ago Flu vaccine need   [facility] Gwyneth Sprout, FNP

## 2023-01-12 ENCOUNTER — Other Ambulatory Visit: Payer: Self-pay | Admitting: Family Medicine

## 2023-01-12 DIAGNOSIS — E039 Hypothyroidism, unspecified: Secondary | ICD-10-CM

## 2023-01-14 NOTE — Telephone Encounter (Signed)
Requested medication (s) are due for refill today:yes  Requested medication (s) are on the active medication list: yes  Last refill:  12/12/22  Future visit scheduled: no  Notes to clinic:  Unable to refill per protocol due to failed labs, no updated results.      Requested Prescriptions  Pending Prescriptions Disp Refills   levothyroxine (SYNTHROID) 150 MCG tablet [Pharmacy Med Name: LEVOTHYROXINE 150 MCG TABLET] 30 tablet 0    Sig: TAKE 1 TABLET BY MOUTH EVERY DAY BEFORE BREAKFAST     Endocrinology:  Hypothyroid Agents Failed - 01/12/2023 12:59 AM      Failed - TSH in normal range and within 360 days    TSH  Date Value Ref Range Status  11/14/2021 1.720 0.450 - 4.500 uIU/mL Final         Passed - Valid encounter within last 12 months    Recent Outpatient Visits           6 months ago Urinary tract infection, recurrent   [facility] Tally Joe T, FNP   6 months ago Urinary tract infection, recurrent   [place], DO   7 months ago UTI symptoms   [facility] Tally Joe T, FNP   10 months ago Urinary tract infection with hematuria, site unspecified   [employer], Dani Gobble, PA-C   1 year ago Flu vaccine need   [facility] Gwyneth Sprout, FNP

## 2023-01-15 ENCOUNTER — Encounter: Payer: Self-pay | Admitting: Family Medicine

## 2023-01-15 ENCOUNTER — Ambulatory Visit: Payer: Commercial Managed Care - PPO | Admitting: Family Medicine

## 2023-01-15 VITALS — BP 120/63 | HR 72 | Temp 97.9°F | Wt 321.2 lb

## 2023-01-15 DIAGNOSIS — E559 Vitamin D deficiency, unspecified: Secondary | ICD-10-CM

## 2023-01-15 DIAGNOSIS — R7303 Prediabetes: Secondary | ICD-10-CM

## 2023-01-15 DIAGNOSIS — I10 Essential (primary) hypertension: Secondary | ICD-10-CM

## 2023-01-15 DIAGNOSIS — E039 Hypothyroidism, unspecified: Secondary | ICD-10-CM

## 2023-01-15 DIAGNOSIS — E782 Mixed hyperlipidemia: Secondary | ICD-10-CM | POA: Diagnosis not present

## 2023-01-15 DIAGNOSIS — F339 Major depressive disorder, recurrent, unspecified: Secondary | ICD-10-CM

## 2023-01-15 DIAGNOSIS — Z1231 Encounter for screening mammogram for malignant neoplasm of breast: Secondary | ICD-10-CM | POA: Insufficient documentation

## 2023-01-15 MED ORDER — VENLAFAXINE HCL ER 150 MG PO CP24: 300.0000 mg | ORAL_CAPSULE | Freq: Every day | ORAL | 1 refills | Status: DC

## 2023-01-15 MED ORDER — PHENTERMINE HCL 37.5 MG PO CAPS: 37.5000 mg | ORAL_CAPSULE | ORAL | 0 refills | Status: DC

## 2023-01-15 NOTE — Assessment & Plan Note (Signed)
Chronic, previously borderline Repeat FLP recommend diet low in saturated fat and regular exercise - 30 min at least 5 times per week On crestor 20 mg

## 2023-01-15 NOTE — Assessment & Plan Note (Signed)
Chronic, previously stable Previously on 150 mcg levothyroxine Repeat labs given weight change of 40# in 6 months

## 2023-01-15 NOTE — Assessment & Plan Note (Signed)
Chronic, waxes/wanes Denies changing medication Reports her SO "made a mistake" and now they are dealing with the repercussions; have been married 31 years. Denies concerns for abuse or violence in the home

## 2023-01-15 NOTE — Assessment & Plan Note (Signed)
Chronic, on OTC supplement Repeat labs

## 2023-01-15 NOTE — Patient Instructions (Addendum)
The CDC recommends two doses of Shingrix (the new shingles vaccine) separated by 2 to 6 months for adults age 60 years and older. I recommend checking with your insurance plan regarding coverage for this vaccine.    Please call and schedule your mammogram:  [facility]  [address], [address] [facility] [place],  [place]  [address] Get Driving Directions Main: [phone]  Sunday:Closed Monday:7:20 AM - 5:00 PM Tuesday:7:20 AM - 5:00 PM Wednesday:7:20 AM - 5:00 PM Thursday:7:20 AM - 5:00 PM Friday:7:20 AM - 4:30 PM Saturday:Closed  

## 2023-01-15 NOTE — Assessment & Plan Note (Signed)
Chronic, stable Repeat A1c Not on medications previously

## 2023-01-15 NOTE — Assessment & Plan Note (Signed)
Chronic, worsening Body mass index is 51.84 kg/m. Continue to recommend balanced, lower carb meals. Smaller meal size, adding snacks. Choosing water as drink of choice and increasing purposeful exercise.

## 2023-01-15 NOTE — Assessment & Plan Note (Signed)
Due for screening for mammogram, denies breast concerns, provided with phone number to call and schedule appointment for mammogram. Encouraged to repeat breast cancer screening every 1-2 years.  

## 2023-01-15 NOTE — Progress Notes (Signed)
I,Connie R Striblin,acting as a Education administrator for Gwyneth Sprout, FNP.,have documented all relevant documentation on the behalf of Gwyneth Sprout, FNP,as directed by  Gwyneth Sprout, FNP while in the presence of Gwyneth Sprout, FNP.   Established patient visit   Patient: Patricia Deleon   DOB: 1961/04/23   62 y.o. Female  MRN: [id number] Visit Date: 01/15/2023  Today's healthcare provider: Gwyneth Sprout, FNP  Re Introduced to nurse practitioner role and practice setting.  All questions answered.  Discussed provider/patient relationship and expectations.  Subjective    HPI  Follow up for medication refill  Levothyroxine 150 MCG  Medications: Outpatient Medications Prior to Visit  Medication Sig   ARIPiprazole (ABILIFY) 10 MG tablet Take 1 tablet (10 mg total) by mouth daily.   Armodafinil (NUVIGIL) 200 MG TABS One po qAM   aspirin 81 MG chewable tablet Chew 81 mg by mouth daily.   baclofen (LIORESAL) 10 MG tablet Take a half tablet to 2 tablets by mouth at bedtime for nighttime spasticity   Cholecalciferol (VITAMIN D3) 2000 UNITS capsule Take 4,000 Units by mouth daily.    Ferrous Sulfate Dried (HM SLOW RELEASE IRON) 45 MG TBCR Take by mouth.   gabapentin (NEURONTIN) 100 MG capsule TAKE 1 CAPSULE BY MOUTH TWICE A DAY   Glatiramer Acetate 40 MG/ML SOSY Inject into the skin.   Glucosamine Sulfate-MSM (MSM/GLUCOSAMINE) 250-250 MG CAPS Take 1 tablet by mouth 2 (two) times daily.    ketoconazole (NIZORAL) 2 % cream Apply 1 application topically daily.   ketoconazole (NIZORAL) 2 % shampoo APPLY 1 APPLICATION TOPICALLY 2 (TWO) TIMES A WEEK.   levothyroxine (SYNTHROID) 150 MCG tablet TAKE 1 TABLET BY MOUTH EVERY DAY BEFORE BREAKFAST   losartan-hydrochlorothiazide (HYZAAR) 50-12.5 MG tablet Take 1 tablet by mouth daily.   magic mouthwash (nystatin, hydrocortisone, diphenhydrAMINE) suspension Swish and swallow 10 mLs 4 (four) times daily as needed for mouth pain.   Omega-3 Fatty Acids (FISH OIL)  500 MG CAPS Take by mouth.   oxybutynin (DITROPAN-XL) 10 MG 24 hr tablet Take 1 tablet (10 mg total) by mouth at bedtime.   pantoprazole (PROTONIX) 40 MG tablet TAKE 1 TABLET (40 MG TOTAL) BY MOUTH 2 (TWO) TIMES DAILY BEFORE A MEAL.   phenazopyridine (PYRIDIUM) 97 MG tablet Take 1 tablet (97 mg total) by mouth 3 (three) times daily as needed for pain.   polyethylene glycol powder (GLYCOLAX/MIRALAX) 17 GM/SCOOP powder Take 17 g by mouth at bedtime.    prochlorperazine (COMPAZINE) 10 MG tablet TAKE 1 TABLET (10 MG TOTAL) BY MOUTH IN THE MORNING, AT NOON, IN THE EVENING, AND AT BEDTIME.   rosuvastatin (CRESTOR) 20 MG tablet TAKE 1 TABLET BY MOUTH EVERY DAY   sulfamethoxazole-trimethoprim (BACTRIM DS) 800-160 MG tablet Take 1 tablet by mouth 2 (two) times daily.   sulfamethoxazole-trimethoprim (BACTRIM) 400-80 MG tablet 1 tablet daily to start after current antibiotic course completed   traZODone (DESYREL) 100 MG tablet TAKE 2 TABLETS BY MOUTH AT BEDTIME   vitamin C (ASCORBIC ACID) 500 MG tablet Take 500 mg by mouth daily.   [DISCONTINUED] venlafaxine XR (EFFEXOR-XR) 150 MG 24 hr capsule Take 2 capsules (300 mg total) by mouth daily with breakfast.   No facility-administered medications prior to visit.   Review of Systems    Objective    BP 120/63 (BP Location: Right Arm, Patient Position: Sitting, Cuff Size: Large)   Pulse 72   Temp 97.9 F (36.6 C) (Oral)  Wt (!) 321 lb 3.2 oz (145.7 kg)   LMP 08/06/2018   SpO2 97%   BMI 51.84 kg/m   Physical Exam Vitals and nursing note reviewed.  Constitutional:      General: She is not in acute distress.    Appearance: Normal appearance. She is obese. She is not ill-appearing, toxic-appearing or diaphoretic.  HENT:     Head: Normocephalic and atraumatic.  Cardiovascular:     Rate and Rhythm: Normal rate and regular rhythm.     Pulses: Normal pulses.     Heart sounds: Normal heart sounds. No murmur heard.    No friction rub. No gallop.   Pulmonary:     Effort: Pulmonary effort is normal. No respiratory distress.     Breath sounds: Normal breath sounds. No stridor. No wheezing, rhonchi or rales.  Chest:     Chest wall: No tenderness.  Musculoskeletal:        General: No swelling, tenderness, deformity or signs of injury. Normal range of motion.     Right lower leg: No edema.     Left lower leg: No edema.  Skin:    General: Skin is warm and dry.     Capillary Refill: Capillary refill takes less than 2 seconds.     Coloration: Skin is not jaundiced or pale.     Findings: No bruising, erythema, lesion or rash.  Neurological:     General: No focal deficit present.     Mental Status: She is alert and oriented to person, place, and time. Mental status is at baseline.     Cranial Nerves: No cranial nerve deficit.     Sensory: No sensory deficit.     Motor: No weakness.     Coordination: Coordination normal.  Psychiatric:        Mood and Affect: Mood normal.        Behavior: Behavior normal.        Thought Content: Thought content normal.        Judgment: Judgment normal.    No results found for any visits on 01/15/23.  Assessment & Plan     Problem List Items Addressed This Visit       Cardiovascular and Mediastinum   Hypertension    HTN: - Medications: Hyzaar 50-12.5 - Compliance: Great - Checking BP at home: No - Denies any SOB, CP, vision changes, LE edema, medication SEs, or symptoms of hypotension - Diet: Regular, unhealthy - Exercise: None, outside of work  Well controlled; chronic Continue current medications Recheck metabolic panel F/u in 6 months  Goal <130/<80       Relevant Orders   CBC   Comprehensive Metabolic Panel (CMET)     Endocrine   Hypothyroidism    Chronic, previously stable Previously on 150 mcg levothyroxine Repeat labs given weight change of 40# in 6 months       Relevant Orders   TSH     Other   Avitaminosis D    Chronic, on OTC supplement Repeat labs       Relevant Orders   Vitamin D (25 hydroxy)   Depression, recurrent (HCC) - Primary    Chronic, waxes/wanes Denies changing medication Reports her SO "made a mistake" and now they are dealing with the repercussions; have been married 31 years. Denies concerns for abuse or violence in the home      Relevant Medications   venlafaxine XR (EFFEXOR-XR) 150 MG 24 hr capsule   Encounter for screening mammogram for  malignant neoplasm of breast    Due for screening for mammogram, denies breast concerns, provided with phone number to call and schedule appointment for mammogram. Encouraged to repeat breast cancer screening every 1-2 years.       Relevant Orders   MM 3D SCREEN BREAST BILATERAL   Mixed hyperlipidemia    Chronic, previously borderline Repeat FLP recommend diet low in saturated fat and regular exercise - 30 min at least 5 times per week On crestor 20 mg       Relevant Orders   Lipid panel   Morbid obesity (HCC)    Chronic, worsening Body mass index is 51.84 kg/m. Continue to recommend balanced, lower carb meals. Smaller meal size, adding snacks. Choosing water as drink of choice and increasing purposeful exercise.       Relevant Medications   phentermine 37.5 MG capsule   Other Relevant Orders   CBC   Comprehensive Metabolic Panel (CMET)   Hemoglobin A1c   Prediabetes    Chronic, stable Repeat A1c Not on medications previously       Relevant Orders   Hemoglobin A1c   Return in about 3 months (around 04/16/2023), or if symptoms worsen or fail to improve, for weight mgmt.     Vonna Kotyk, FNP, have reviewed all documentation for this visit. The documentation on 01/15/23 for the exam, diagnosis, procedures, and orders are all accurate and complete.  Gwyneth Sprout, [place] [phone] (phone) [phone] (fax)  [place]

## 2023-01-15 NOTE — Assessment & Plan Note (Addendum)
HTN: - Medications: Hyzaar 50-12.5 - Compliance: Great - Checking BP at home: No - Denies any SOB, CP, vision changes, LE edema, medication SEs, or symptoms of hypotension - Diet: Regular, unhealthy - Exercise: None, outside of work  Well controlled; chronic Continue current medications Recheck metabolic panel F/u in 6 months  Goal <130/<80

## 2023-01-16 LAB — COMPREHENSIVE METABOLIC PANEL
ALT: 16 IU/L (ref 0–32)
AST: 15 IU/L (ref 0–40)
Albumin/Globulin Ratio: 1.5 (ref 1.2–2.2)
Albumin: 4.2 g/dL (ref 3.8–4.9)
Alkaline Phosphatase: 101 IU/L (ref 44–121)
BUN/Creatinine Ratio: 19 (ref 9–23)
BUN: 17 mg/dL (ref 6–24)
Bilirubin Total: 0.3 mg/dL (ref 0.0–1.2)
CO2: 23 mmol/L (ref 20–29)
Calcium: 8.9 mg/dL (ref 8.7–10.2)
Chloride: 104 mmol/L (ref 96–106)
Creatinine, Ser: 0.88 mg/dL (ref 0.57–1.00)
Globulin, Total: 2.8 g/dL (ref 1.5–4.5)
Glucose: 111 mg/dL — ABNORMAL HIGH (ref 70–99)
Potassium: 4.4 mmol/L (ref 3.5–5.2)
Sodium: 141 mmol/L (ref 134–144)
Total Protein: 7 g/dL (ref 6.0–8.5)
eGFR: 76 mL/min/{1.73_m2} (ref >59)

## 2023-01-16 LAB — VITAMIN D 25 HYDROXY (VIT D DEFICIENCY, FRACTURES): Vit D, 25-Hydroxy: 20.4 ng/mL — ABNORMAL LOW (ref 30.0–100.0)

## 2023-01-16 LAB — LIPID PANEL
Chol/HDL Ratio: 6.7 ratio — ABNORMAL HIGH (ref 0.0–4.4)
Cholesterol, Total: 220 mg/dL — ABNORMAL HIGH (ref 100–199)
HDL: 33 mg/dL — ABNORMAL LOW (ref >39)
LDL Chol Calc (NIH): 137 mg/dL — ABNORMAL HIGH (ref 0–99)
Triglycerides: 278 mg/dL — ABNORMAL HIGH (ref 0–149)
VLDL Cholesterol Cal: 50 mg/dL — ABNORMAL HIGH (ref 5–40)

## 2023-01-16 LAB — CBC
Hematocrit: 41.1 % (ref 34.0–46.6)
Hemoglobin: 13.6 g/dL (ref 11.1–15.9)
MCH: 28.9 pg (ref 26.6–33.0)
MCHC: 33.1 g/dL (ref 31.5–35.7)
MCV: 87 fL (ref 79–97)
Platelets: 188 10*3/uL (ref 150–450)
RBC: 4.7 x10E6/uL (ref 3.77–5.28)
RDW: 13.9 % (ref 11.7–15.4)
WBC: 3.4 10*3/uL (ref 3.4–10.8)

## 2023-01-16 LAB — HEMOGLOBIN A1C
Est. average glucose Bld gHb Est-mCnc: 126 mg/dL
Hgb A1c MFr Bld: 6 % — ABNORMAL HIGH (ref 4.8–5.6)

## 2023-01-16 LAB — TSH: TSH: 9.84 u[IU]/mL — ABNORMAL HIGH (ref 0.450–4.500)

## 2023-01-16 NOTE — Progress Notes (Signed)
Chronic, stable pre-diabetes A1c; Continue to recommend balanced, lower carb meals. Smaller meal size, adding snacks. Choosing water as drink of choice and increasing purposeful exercise.  Chronic Vit D deficiency. I recommend high dose supplement Rx.  Cholesterol is grossly changed; total is higher, fats higher, good/HDL cholesterol lower, bad cholesterol/LDL increased. The 10-year ASCVD risk score (Arnett DK, et al., 2019) is: 5.5%. Ensure you are taking her cholesterol medication, Crestor 20 mg. If you have been stable with medication use- recommend increase to Crestor 40 mg.    Values used to calculate the score:     Age: 62 years     Sex: Female     Is Non-Hispanic African American: No     Diabetic: No     Tobacco smoker: No     Systolic Blood Pressure: 220 mmHg     Is BP treated: Yes     HDL Cholesterol: 33 mg/dL     Total Cholesterol: 220 mg/dL  Cell count is stable.  Please call and schedule your mammogram.

## 2023-01-18 ENCOUNTER — Telehealth: Payer: Self-pay

## 2023-01-18 ENCOUNTER — Ambulatory Visit: Payer: Self-pay | Admitting: *Deleted

## 2023-01-18 NOTE — Telephone Encounter (Signed)
  Patient called, left VM to return the call to the office for lab results.    Patricia Sprout, FNP 01/16/2023  1:08 PM EST     Chronic, stable pre-diabetes A1c; Continue to recommend balanced, lower carb meals. Smaller meal size, adding snacks. Choosing water as drink of choice and increasing purposeful exercise.   Chronic Vit D deficiency. I recommend high dose supplement Rx.   Cholesterol is grossly changed; total is higher, fats higher, good/HDL cholesterol lower, bad cholesterol/LDL increased. The 10-year ASCVD risk score (Arnett DK, et al., 2019) is: 5.5%. Ensure you are taking her cholesterol medication, Crestor 20 mg. If you have been stable with medication use- recommend increase to Crestor 40 mg.     Values used to calculate the score:     Age: 72 years     Sex: Female     Is Non-Hispanic African American: No     Diabetic: No     Tobacco smoker: No     Systolic Blood Pressure: 017 mmHg     Is BP treated: Yes     HDL Cholesterol: 33 mg/dL     Total Cholesterol: 220 mg/dL   Cell count is stable.   Please call and schedule your mammogram.

## 2023-01-18 NOTE — Telephone Encounter (Signed)
Results given in Nurse Triage encounter.

## 2023-01-18 NOTE — Telephone Encounter (Signed)
Pt given lab results per notes of E. Rollene Rotunda, FNP from 01/16/23 on 01/18/23. Pt verbalized understanding. Patient aware to continue balanced , lower carb meals. Smaller meal size, adding snacks and water to drink with purposeful exercise. Patient requesting what high dose supplement to take for Vit D deficiency?. Patient reports she ran out of crestor 20 mg for "a while" and just started back. Do you recommend she increase dose to 40 mg ? Patient aware to call and schedule mammogram. Please advise

## 2023-01-30 ENCOUNTER — Other Ambulatory Visit: Payer: Self-pay

## 2023-01-30 DIAGNOSIS — E559 Vitamin D deficiency, unspecified: Secondary | ICD-10-CM

## 2023-01-30 MED ORDER — VITAMIN D (ERGOCALCIFEROL) 1.25 MG (50000 UNIT) PO CAPS: 50000.0000 [IU] | ORAL_CAPSULE | ORAL | 0 refills | Status: DC

## 2023-02-26 ENCOUNTER — Encounter: Payer: Self-pay | Admitting: Family Medicine

## 2023-02-26 ENCOUNTER — Ambulatory Visit: Payer: Commercial Managed Care - PPO | Admitting: Family Medicine

## 2023-02-26 VITALS — BP 130/83 | HR 97 | Temp 99.1°F | Wt 314.0 lb

## 2023-02-26 DIAGNOSIS — J069 Acute upper respiratory infection, unspecified: Secondary | ICD-10-CM | POA: Insufficient documentation

## 2023-02-26 DIAGNOSIS — U071 COVID-19: Secondary | ICD-10-CM | POA: Diagnosis not present

## 2023-02-26 LAB — POCT INFLUENZA A/B
Influenza A, POC: NEGATIVE
Influenza B, POC: NEGATIVE

## 2023-02-26 LAB — POC COVID19 BINAXNOW: SARS Coronavirus 2 Ag: POSITIVE — AB

## 2023-02-26 MED ORDER — NIRMATRELVIR/RITONAVIR (PAXLOVID)TABLET: 3.0000 | ORAL_TABLET | Freq: Two times a day (BID) | ORAL | 0 refills | Status: AC

## 2023-02-26 NOTE — Patient Instructions (Signed)
Your symptoms and exam findings are most consistent with a viral upper respiratory infection. These usually run their course in 5-7 days. Unfortunately, antibiotics don't work against viruses and just increase your risk of other issues such as diarrhea, yeast infections, and resistant infections.  If you start feeling worse with facial pain, high fever, cough, shortness of breath or start feeling significantly worse, please call us right away to be further evaluated.  Some things that can make you feel better are: - Increased rest - Increasing Fluids - Acetaminophen / ibuprofen as needed for fever/pain.  - Salt water gargling, chloraseptic spray and throat lozenges - OTC pseudoephedrine.  - Mucinex.  - Saline sinus flushes or a neti pot.  - Humidifying the air.

## 2023-02-26 NOTE — Progress Notes (Signed)
I,Connie R Striblin,acting as a Education administrator for Gwyneth Sprout, FNP.,have documented all relevant documentation on the behalf of Gwyneth Sprout, FNP,as directed by  Gwyneth Sprout, FNP while in the presence of Gwyneth Sprout, FNP.  Established patient visit  Patient: Patricia Deleon   DOB: 05-09-1961   62 y.o. Female  MRN: [id number] Visit Date: 02/26/2023  Today's healthcare provider: Gwyneth Sprout, FNP  Re Introduced to nurse practitioner role and practice setting.  All questions answered.  Discussed provider/patient relationship and expectations.  Chief Complaint  Patient presents with   Nasal Congestion   Subjective    HPI  Upper respiratory symptoms She complains ofleft ear pressure/pain, congestion, non productive cough, and post nasal drip .with chills, and possible fever. Onset of symptoms was a few days ago and worsening.She is drinking plenty of fluids.   Symptoms started Sunday, worsened last night   No known exposure- whole family sick  No at home testing  ---------------------------------------------------------------------------------------------------   Medications: Outpatient Medications Prior to Visit  Medication Sig   ARIPiprazole (ABILIFY) 10 MG tablet Take 1 tablet (10 mg total) by mouth daily.   Armodafinil (NUVIGIL) 200 MG TABS One po qAM (Patient taking differently: 250 mg. One po qAM)   aspirin 81 MG chewable tablet Chew 81 mg by mouth daily.   baclofen (LIORESAL) 10 MG tablet Take a half tablet to 2 tablets by mouth at bedtime for nighttime spasticity   Cholecalciferol (VITAMIN D3) 2000 UNITS capsule Take 4,000 Units by mouth daily.    Ferrous Sulfate Dried (HM SLOW RELEASE IRON) 45 MG TBCR Take by mouth.   gabapentin (NEURONTIN) 100 MG capsule TAKE 1 CAPSULE BY MOUTH TWICE A DAY   Glatiramer Acetate 40 MG/ML SOSY Inject into the skin.   Glucosamine Sulfate-MSM (MSM/GLUCOSAMINE) 250-250 MG CAPS Take 1 tablet by mouth 2 (two) times daily.    ketoconazole  (NIZORAL) 2 % cream Apply 1 application topically daily.   ketoconazole (NIZORAL) 2 % shampoo APPLY 1 APPLICATION TOPICALLY 2 (TWO) TIMES A WEEK.   levothyroxine (SYNTHROID) 150 MCG tablet TAKE 1 TABLET BY MOUTH EVERY DAY BEFORE BREAKFAST   losartan-hydrochlorothiazide (HYZAAR) 50-12.5 MG tablet Take 1 tablet by mouth daily.   magic mouthwash (nystatin, hydrocortisone, diphenhydrAMINE) suspension Swish and swallow 10 mLs 4 (four) times daily as needed for mouth pain.   Omega-3 Fatty Acids (FISH OIL) 500 MG CAPS Take by mouth.   oxybutynin (DITROPAN-XL) 10 MG 24 hr tablet Take 1 tablet (10 mg total) by mouth at bedtime.   pantoprazole (PROTONIX) 40 MG tablet TAKE 1 TABLET (40 MG TOTAL) BY MOUTH 2 (TWO) TIMES DAILY BEFORE A MEAL.   phenazopyridine (PYRIDIUM) 97 MG tablet Take 1 tablet (97 mg total) by mouth 3 (three) times daily as needed for pain.   phentermine 37.5 MG capsule Take 1 capsule (37.5 mg total) by mouth every morning.   polyethylene glycol powder (GLYCOLAX/MIRALAX) 17 GM/SCOOP powder Take 17 g by mouth at bedtime.    prochlorperazine (COMPAZINE) 10 MG tablet TAKE 1 TABLET (10 MG TOTAL) BY MOUTH IN THE MORNING, AT NOON, IN THE EVENING, AND AT BEDTIME.   rosuvastatin (CRESTOR) 20 MG tablet TAKE 1 TABLET BY MOUTH EVERY DAY   sulfamethoxazole-trimethoprim (BACTRIM DS) 800-160 MG tablet Take 1 tablet by mouth 2 (two) times daily.   sulfamethoxazole-trimethoprim (BACTRIM) 400-80 MG tablet 1 tablet daily to start after current antibiotic course completed   traZODone (DESYREL) 100 MG tablet TAKE 2 TABLETS BY MOUTH  AT BEDTIME   venlafaxine XR (EFFEXOR-XR) 150 MG 24 hr capsule Take 2 capsules (300 mg total) by mouth daily with breakfast.   vitamin C (ASCORBIC ACID) 500 MG tablet Take 500 mg by mouth daily.   Vitamin D, Ergocalciferol, (DRISDOL) 1.25 MG (50000 UNIT) CAPS capsule Take 1 capsule (50,000 Units total) by mouth every 7 (seven) days.   No facility-administered medications prior to  visit.    Review of Systems     Objective    BP 130/83 (BP Location: Right Arm, Patient Position: Sitting, Cuff Size: Normal)   Pulse 97   Temp 99.1 F (37.3 C) (Oral)   Wt (!) 314 lb (142.4 kg)   LMP 08/06/2018   SpO2 98%   BMI 50.68 kg/m    Physical Exam Vitals and nursing note reviewed.  Constitutional:      General: She is not in acute distress.    Appearance: Normal appearance. She is obese. She is ill-appearing. She is not toxic-appearing or diaphoretic.  HENT:     Head: Normocephalic and atraumatic.  Cardiovascular:     Rate and Rhythm: Normal rate and regular rhythm.     Pulses: Normal pulses.  Pulmonary:     Effort: Pulmonary effort is normal.  Musculoskeletal:        General: No swelling, tenderness, deformity or signs of injury. Normal range of motion.     Right lower leg: No edema.     Left lower leg: No edema.  Skin:    General: Skin is warm and dry.     Capillary Refill: Capillary refill takes less than 2 seconds.     Coloration: Skin is not jaundiced or pale.     Findings: No bruising, erythema, lesion or rash.  Neurological:     General: No focal deficit present.     Mental Status: She is alert and oriented to person, place, and time. Mental status is at baseline.     Cranial Nerves: No cranial nerve deficit.     Sensory: No sensory deficit.     Motor: No weakness.     Coordination: Coordination normal.  Psychiatric:        Mood and Affect: Mood normal.        Behavior: Behavior normal.        Thought Content: Thought content normal.        Judgment: Judgment normal.     Results for orders placed or performed in visit on 02/26/23  POC COVID-19  Result Value Ref Range   SARS Coronavirus 2 Ag Positive (A) Negative  POCT Influenza A/B  Result Value Ref Range   Influenza A, POC Negative Negative   Influenza B, POC Negative Negative    Assessment & Plan     Problem List Items Addressed This Visit       Respiratory   Viral upper  respiratory tract infection - Primary    POC Covid + Family sick Patient reports mild symptoms; however, would like to use anti-viral to assist given her comorbid conditions RTC if s/s do not improve with use of anti-viral, rest, fluids, and OTC cough/cold medications      Relevant Medications   nirmatrelvir/ritonavir (PAXLOVID) 20 x 150 MG & 10 x '100MG'$  TABS   Other Relevant Orders   POC COVID-19 (Completed)   POCT Influenza A/B (Completed)     Other   COVID-19    POC + Recommend 5 day quarantine and 5 day masking Encourage family testing and treatment if  needed Seek emergent care if s/s worsen acutely RTC if lingering symptoms remain following tx with anti-viral      Relevant Medications   nirmatrelvir/ritonavir (PAXLOVID) 20 x 150 MG & 10 x '100MG'$  TABS   Return if symptoms worsen or fail to improve.     Vonna Kotyk, FNP, have reviewed all documentation for this visit. The documentation on 02/26/23 for the exam, diagnosis, procedures, and orders are all accurate and complete.  Gwyneth Sprout, [place] [phone] (phone) [phone] (fax)  [place]

## 2023-02-26 NOTE — Assessment & Plan Note (Signed)
POC Covid + Family sick Patient reports mild symptoms; however, would like to use anti-viral to assist given her comorbid conditions RTC if s/s do not improve with use of anti-viral, rest, fluids, and OTC cough/cold medications

## 2023-02-26 NOTE — Assessment & Plan Note (Signed)
POC + Recommend 5 day quarantine and 5 day masking Encourage family testing and treatment if needed Seek emergent care if s/s worsen acutely RTC if lingering symptoms remain following tx with anti-viral

## 2023-03-14 ENCOUNTER — Ambulatory Visit
Admission: RE | Admit: 2023-03-14 | Discharge: 2023-03-14 | Disposition: A | Discharge status: Home / Self Care | Payer: Commercial Managed Care - PPO | Source: Ambulatory Visit | Attending: Family Medicine | Admitting: Family Medicine

## 2023-03-14 DIAGNOSIS — Z1231 Encounter for screening mammogram for malignant neoplasm of breast: Secondary | ICD-10-CM | POA: Insufficient documentation

## 2023-03-18 NOTE — Progress Notes (Signed)
Hi Sheri  Normal mammogram; repeat in 1 year.  Please let us know if you have any questions.  Thank you,  Tally Joe, FNP

## 2023-03-20 ENCOUNTER — Other Ambulatory Visit: Payer: Self-pay | Admitting: Family Medicine

## 2023-03-20 DIAGNOSIS — E039 Hypothyroidism, unspecified: Secondary | ICD-10-CM

## 2023-03-20 DIAGNOSIS — I10 Essential (primary) hypertension: Secondary | ICD-10-CM

## 2023-03-26 NOTE — Progress Notes (Unsigned)
03/27/2023 1:51 PM   Patricia Deleon Camuso 06-Oct-1961 [id number]  Referring provider: Gwyneth Deleon, [place],  [place] [address]  Urological history 1. rUTI's -contributing factors of age, vaginal atrophy, MS, diabetes and obesity  July 20, 2022 mixed urogenital flora  July 12, 2022 E. Coli  June 26, 2022 E. Coli  June 14, 2022 mixed urogenital flora  June 03, 2022 E. Coli  2. Mixed incontinence -Contributing factors of age, vaginal atrophy, MS, obesity, hypertension, depression, sleep apnea and diuretics     Chief Complaint  Patient presents with   Follow-up    HPI: Patricia Deleon is a 62 y.o. female who is having trouble holding her urine.   She states she has been having issues with the incontinence since she was last seen here in July.  She is having to make frequent trips to the restroom with urgency.  She is also having episodes of urge incontinence.  Sometimes she will soak her incontinent pads.  She also has noted some stool incontinence.  She is not taking the oxybutynin.  Patient denies any modifying or aggravating factors.  Patient denies any gross hematuria, dysuria or suprapubic/flank pain.  Patient denies any fevers, chills, nausea or vomiting.    UA yellow cloudy, specific gravity 1.020, trace blood, pH 6.0, 1+ protein, nitrate positive, 3+ leukocytes, greater than 30 WBCs, 3-10 RBCs, greater than 10 epithelial cells, hyaline casts are present, mucus threads are present and many bacteria.  KUB did not show significant constipation or stones  PVR 12 mL     PMH: Past Medical History:  Diagnosis Date   Agoraphobia with panic disorder    Anemia    Depression    Erosive gastritis    Fundic gland polyps of stomach, benign    Geographic tongue    GERD (gastroesophageal reflux disease)    History of esophagogastroduodenoscopy (EGD)    Hypercholesterolemia    Hypertension    Intervertebral disc disorder     thoracic,thoracolumber,lumbosacral   Multiple sclerosis ([place])    Obesity    Sleep apnea     Surgical History: Past Surgical History:  Procedure Laterality Date   COLONOSCOPY     COLONOSCOPY WITH PROPOFOL N/A 07/07/2018   Procedure: COLONOSCOPY WITH PROPOFOL;  Surgeon: Lollie Sails, MD;  Location[facility] END[facility]COPY;  Service: Endoscopy;  Laterality: N/A;   ESOPHAGOGASTRODUODENOSCOPY (EGD) WITH PROPOFOL N/A 01/31/2016   Procedure: ESOPHAGOGASTRODUODENOSCOPY (EGD) WITH PROPOFOL;  Surgeon: Lollie Sails, MD;  Location: [facility] ENDOSCOPY;  Service: Endoscopy;  Laterality: N/A;   IR FLUORO GUIDE CV LINE RIGHT  10/06/2018   IR REMOVAL TUN CV CATH W/O FL  10/15/2018   IR US GUIDE VASC ACCESS RIGHT  [id number]   NISSEN FUNDOPLICATION      Home Medications:  Allergies as of 03/27/2023       Reactions   Shellfish Allergy Hives        Medication List        Accurate as of March 27, 2023  1:51 PM. If you have any questions, ask your nurse or doctor.          STOP taking these medications    baclofen 10 MG tablet Commonly known as: LIORESAL Stopped by: Zara Council, PA-C   MSM/Glucosamine 250-250 MG Caps Stopped by: Zuhayr Deeney, PA-C   oxybutynin 10 MG 24 hr tablet Commonly known as: DITROPAN-XL Stopped by: Zara Council, PA-C   phenazopyridine 97 MG tablet Commonly known as: PYRIDIUM Stopped by:  Abi Shoults, PA-C   sulfamethoxazole-trimethoprim 400-80 MG tablet Commonly known as: BACTRIM Stopped by: Holden Draughon, PA-C   sulfamethoxazole-trimethoprim 800-160 MG tablet Commonly known as: BACTRIM DS Stopped by: Claudia Alvizo, PA-C       TAKE these medications    ARIPiprazole 10 MG tablet Commonly known as: ABILIFY Take 1 tablet (10 mg total) by mouth daily.   Armodafinil 200 MG Tabs Commonly known as: Nuvigil One po qAM What changed: how much to take   ascorbic acid 500 MG tablet Commonly known as: VITAMIN C Take 500 mg by mouth daily.    aspirin 81 MG chewable tablet Chew 81 mg by mouth daily.   Fish Oil 500 MG Caps Take by mouth.   gabapentin 100 MG capsule Commonly known as: NEURONTIN TAKE 1 CAPSULE BY MOUTH TWICE A DAY   Glatiramer Acetate 40 MG/ML Sosy Inject into the skin.   HM Slow Release Iron 45 MG Tbcr Generic drug: Ferrous Sulfate Dried Take by mouth.   ketoconazole 2 % shampoo Commonly known as: NIZORAL APPLY 1 APPLICATION TOPICALLY 2 (TWO) TIMES A WEEK.   ketoconazole 2 % cream Commonly known as: NIZORAL Apply 1 application topically daily.   levothyroxine 150 MCG tablet Commonly known as: SYNTHROID TAKE 1 TABLET BY MOUTH EVERY DAY BEFORE BREAKFAST   losartan-hydrochlorothiazide 50-12.5 MG tablet Commonly known as: HYZAAR TAKE 1 TABLET BY MOUTH EVERY DAY   magic mouthwash (nystatin, hydrocortisone, diphenhydrAMINE) suspension Swish and swallow 10 mLs 4 (four) times daily as needed for mouth pain.   mirabegron ER 25 MG Tb24 tablet Commonly known as: MYRBETRIQ Take 1 tablet (25 mg total) by mouth daily. Started by: Zara Council, PA-C   pantoprazole 40 MG tablet Commonly known as: PROTONIX TAKE 1 TABLET (40 MG TOTAL) BY MOUTH 2 (TWO) TIMES DAILY BEFORE A MEAL.   phentermine 37.5 MG capsule Take 1 capsule (37.5 mg total) by mouth every morning.   polyethylene glycol powder 17 GM/SCOOP powder Commonly known as: GLYCOLAX/MIRALAX Take 17 g by mouth at bedtime.   prochlorperazine 10 MG tablet Commonly known as: COMPAZINE TAKE 1 TABLET (10 MG TOTAL) BY MOUTH IN THE MORNING, AT NOON, IN THE EVENING, AND AT BEDTIME.   rosuvastatin 20 MG tablet Commonly known as: CRESTOR TAKE 1 TABLET BY MOUTH EVERY DAY   traZODone 100 MG tablet Commonly known as: DESYREL TAKE 2 TABLETS BY MOUTH AT BEDTIME   venlafaxine XR 150 MG 24 hr capsule Commonly known as: EFFEXOR-XR Take 2 capsules (300 mg total) by mouth daily with breakfast.   Vitamin D (Ergocalciferol) 1.25 MG (50000 UNIT) Caps  capsule Commonly known as: DRISDOL Take 1 capsule (50,000 Units total) by mouth every 7 (seven) days.   Vitamin D3 50 MCG (2000 UT) Caps Generic drug: Cholecalciferol Take 4,000 Units by mouth daily.        Allergies:  Allergies  Allergen Reactions   Shellfish Allergy Hives    Family History: Family History  Problem Relation Age of Onset   Lung cancer Mother    Hypertension Mother    Alcohol abuse Mother    Anxiety disorder Mother    Hypertension Father    Arthritis Father    Alcohol abuse Father    Esophageal cancer Father    Alcohol abuse Brother    Hypertension Brother    Autism Son    Coronary artery disease Maternal Grandfather    Heart attack Maternal Grandfather    CVA Paternal Grandmother    Skin cancer Paternal Grandmother  Cancer Maternal Grandmother     Social History:  reports that she has never smoked. She has never used smokeless tobacco. She reports that she does not drink alcohol and does not use drugs.   Physical Exam: BP (!) 154/77   Pulse 88   Ht 5\' 6"  (1.676 m)   Wt (!) 312 lb (141.5 kg)   LMP 08/06/2018   BMI 50.36 kg/m   Constitutional:  Well nourished. Alert and oriented, No acute distress. HEENT: [place] AT, moist mucus membranes.  Trachea midline Cardiovascular: No clubbing, cyanosis, or edema. Respiratory: Normal respiratory effort, no increased work of breathing. Neurologic: Grossly intact, no focal deficits, moving all 4 extremities. Psychiatric: Normal mood and affect.    Laboratory Data: Hemoglobin A1c (12/2022) 6.0 Serum creatinine (12/2022) 0.88 Urinalysis See EPIC and HPI I have reviewed the labs.   Pertinent Imaging  03/27/23 13:04  Scan Result 12    Assessment & Plan:    1.  Mixed Incontinence -Will have a trial of OAB medication and I have chosen Myrbetriq 25 mg daily instead of restarting the oxybutynin because she had mentioned during the visit that she feels that she may have issues with forgetfulness and I do  not want to worsen that for her -I will have her return in 6 weeks for an OAB questionnaire and PVR  2. Microscopic hematuria -UA with many squames so likely contaminated but with microscopic hematuria -UA sent for culture -Will treat if it results positive that she has continued issues with incontinence and to see if the microscopic hematuria will resolve -Recheck urinalysis upon return  Zara Council, [place] [address], [place], [place] [address] [phone]

## 2023-03-27 ENCOUNTER — Ambulatory Visit
Admission: RE | Admit: 2023-03-27 | Discharge: 2023-03-27 | Disposition: A | Discharge status: Home / Self Care | Payer: Commercial Managed Care - PPO | Source: Ambulatory Visit | Attending: Urology | Admitting: Urology

## 2023-03-27 ENCOUNTER — Ambulatory Visit
Admission: RE | Admit: 2023-03-27 | Discharge: 2023-03-27 | Disposition: A | Discharge status: Home / Self Care | Payer: Commercial Managed Care - PPO | Attending: Urology | Admitting: Urology

## 2023-03-27 ENCOUNTER — Encounter: Payer: Self-pay | Admitting: Urology

## 2023-03-27 ENCOUNTER — Ambulatory Visit: Payer: Commercial Managed Care - PPO | Admitting: Urology

## 2023-03-27 VITALS — BP 154/77 | HR 88 | Ht 66.0 in | Wt 312.0 lb

## 2023-03-27 DIAGNOSIS — N3946 Mixed incontinence: Secondary | ICD-10-CM

## 2023-03-27 DIAGNOSIS — R3129 Other microscopic hematuria: Secondary | ICD-10-CM

## 2023-03-27 DIAGNOSIS — N390 Urinary tract infection, site not specified: Secondary | ICD-10-CM

## 2023-03-27 DIAGNOSIS — Z87440 Personal history of urinary (tract) infections: Secondary | ICD-10-CM

## 2023-03-27 LAB — URINALYSIS, COMPLETE
Bilirubin, UA: NEGATIVE
Glucose, UA: NEGATIVE
Ketones, UA: NEGATIVE
Nitrite, UA: POSITIVE — AB
Specific Gravity, UA: 1.02 (ref 1.005–1.030)
Urobilinogen, Ur: 1 mg/dL (ref 0.2–1.0)
pH, UA: 6 (ref 5.0–7.5)

## 2023-03-27 LAB — BLADDER SCAN AMB NON-IMAGING: Scan Result: 12

## 2023-03-27 LAB — MICROSCOPIC EXAMINATION
Epithelial Cells (non renal): >10 /hpf — AB (ref 0–10)
WBC, UA: >30 /hpf — AB (ref 0–5)

## 2023-03-27 MED ORDER — MIRABEGRON ER 25 MG PO TB24: 25.0000 mg | ORAL_TABLET | Freq: Every day | ORAL | 0 refills | Status: DC

## 2023-03-29 LAB — CULTURE, URINE COMPREHENSIVE

## 2023-04-01 ENCOUNTER — Telehealth: Payer: Self-pay | Admitting: Family Medicine

## 2023-04-01 MED ORDER — NITROFURANTOIN MONOHYD MACRO 100 MG PO CAPS: 100.0000 mg | ORAL_CAPSULE | Freq: Two times a day (BID) | ORAL | 0 refills | Status: DC

## 2023-04-01 NOTE — Telephone Encounter (Signed)
-----   Message from Nori Riis, PA-C sent at 03/31/2023  4:50 PM EDT ----- Please let Patricia Deleon know that her urine culture was positive for infection and she needs to start Macrobid twice daily for seven days.

## 2023-04-01 NOTE — Telephone Encounter (Signed)
[facility] informed patient of result and to pick up ABX.

## 2023-04-19 ENCOUNTER — Ambulatory Visit: Payer: Commercial Managed Care - PPO | Admitting: Family Medicine

## 2023-04-20 ENCOUNTER — Other Ambulatory Visit: Payer: Self-pay | Admitting: Family Medicine

## 2023-04-20 DIAGNOSIS — E559 Vitamin D deficiency, unspecified: Secondary | ICD-10-CM

## 2023-04-22 NOTE — Progress Notes (Unsigned)
I,J'ya E Naija Troost,acting as a scribe for Jacky Kindle, FNP.,have documented all relevant documentation on the behalf of Jacky Kindle, FNP,as directed by  Jacky Kindle, FNP while in the presence of Jacky Kindle, FNP.   Established patient visit  Patient: Patricia Deleon   DOB: 10-10-1961   62 y.o. Female  MRN: [id number] Visit Date: 04/23/2023  Today's healthcare provider: Jacky Kindle, FNP  Re Introduced to nurse practitioner role and practice setting.  All questions answered.  Discussed provider/patient relationship and expectations.  Chief Complaint  Patient presents with   Depression Follow Up   Subjective    HPI  Depression, Follow-up  She  was last seen for this 3 months ago. Changes made at last visit include venlafaxine XR (EFFEXOR-XR) 150 MG.   She reports excellent compliance with treatment. She is not having side effects.   She reports excellent tolerance of treatment. Current symptoms include: depressed mood and difficulty concentrating She feels she is Worse since last visit.     04/23/2023    9:40 AM 06/26/2022    9:52 AM 06/14/2022    1:05 PM  Depression screen PHQ 2/9  Decreased Interest 1 3 0  Down, Depressed, Hopeless 1 2 0  PHQ - 2 Score 2 5 0  Altered sleeping 0 1 0  Tired, decreased energy Change in appetite Feeling bad or failure about yourself  1 0 0  Trouble concentrating 0 0 0  Moving slowly or fidgety/restless 0 0 0  Suicidal thoughts 0 0 0  PHQ-9 Score Difficult doing work/chores Not difficult at all Extremely dIfficult Not difficult at all    -----------------------------------------------------------------------------------------   Medications: Outpatient Medications Prior to Visit  Medication Sig   ARIPiprazole (ABILIFY) 10 MG tablet Take 1 tablet (10 mg total) by mouth daily.   Armodafinil (NUVIGIL) 200 MG TABS One po qAM (Patient taking differently: 250 mg. One po qAM)   aspirin 81 MG chewable tablet  Chew 81 mg by mouth daily.   Cholecalciferol (VITAMIN D3) 2000 UNITS capsule Take 4,000 Units by mouth daily.    Ferrous Sulfate Dried (HM SLOW RELEASE IRON) 45 MG TBCR Take by mouth.   gabapentin (NEURONTIN) 100 MG capsule TAKE 1 CAPSULE BY MOUTH TWICE A DAY   Glatiramer Acetate 40 MG/ML SOSY Inject into the skin.   ketoconazole (NIZORAL) 2 % cream Apply 1 application topically daily.   ketoconazole (NIZORAL) 2 % shampoo APPLY 1 APPLICATION TOPICALLY 2 (TWO) TIMES A WEEK.   levothyroxine (SYNTHROID) 150 MCG tablet TAKE 1 TABLET BY MOUTH EVERY DAY BEFORE BREAKFAST   losartan-hydrochlorothiazide (HYZAAR) 50-12.5 MG tablet TAKE 1 TABLET BY MOUTH EVERY DAY   magic mouthwash (nystatin, hydrocortisone, diphenhydrAMINE) suspension Swish and swallow 10 mLs 4 (four) times daily as needed for mouth pain.   mirabegron ER (MYRBETRIQ) 25 MG TB24 tablet Take 1 tablet (25 mg total) by mouth daily.   nitrofurantoin, macrocrystal-monohydrate, (MACROBID) 100 MG capsule Take 1 capsule (100 mg total) by mouth every 12 (twelve) hours.   Omega-3 Fatty Acids (FISH OIL) 500 MG CAPS Take by mouth.   pantoprazole (PROTONIX) 40 MG tablet TAKE 1 TABLET (40 MG TOTAL) BY MOUTH 2 (TWO) TIMES DAILY BEFORE A MEAL.   phentermine 37.5 MG capsule Take 1 capsule (37.5 mg total) by mouth every morning.   polyethylene glycol powder (GLYCOLAX/MIRALAX) 17 GM/SCOOP powder Take 17 g by mouth at bedtime.  prochlorperazine (COMPAZINE) 10 MG tablet TAKE 1 TABLET (10 MG TOTAL) BY MOUTH IN THE MORNING, AT NOON, IN THE EVENING, AND AT BEDTIME.   rosuvastatin (CRESTOR) 20 MG tablet TAKE 1 TABLET BY MOUTH EVERY DAY   traZODone (DESYREL) 100 MG tablet TAKE 2 TABLETS BY MOUTH AT BEDTIME   venlafaxine XR (EFFEXOR-XR) 150 MG 24 hr capsule Take 2 capsules (300 mg total) by mouth daily with breakfast.   vitamin C (ASCORBIC ACID) 500 MG tablet Take 500 mg by mouth daily.   Vitamin D, Ergocalciferol, (DRISDOL) 1.25 MG (50000 UNIT) CAPS capsule TAKE 1  CAPSULE (50,000 UNITS TOTAL) BY MOUTH EVERY 7 (SEVEN) DAYS   No facility-administered medications prior to visit.    Review of Systems    Objective    BP 127/69 (BP Location: Left Arm, Patient Position: Sitting, Cuff Size: Large)   Pulse 74   Temp 98.6 F (37 C) (Oral)   Ht  (1.676 m)   Wt (!) 321 lb 14.4 oz (146 kg)   LMP 08/06/2018   SpO2 98%   BMI 51.96 kg/m   Physical Exam   No results found for any visits on 04/23/23.  Assessment & Plan     Problem List Items Addressed This Visit       Cardiovascular and Mediastinum   Hypertension    Chronic, stable Continue hyzaar 50-12.5        Endocrine   Hypothyroidism    Chronic, previously abnormal Repeat TSH panel Reports 150 mcg stable s/s of levothyroixine       Relevant Orders   TSH + free T4     Nervous and Auditory   Multiple sclerosis    Chronic, stable Continues to have some urinary complaints and is followed by urology and reports improving symptoms on myrbertriq         Other   Adiposity    Chronic, Body mass index is 51.96 kg/m. Discussed importance of healthy weight management Discussed diet and exercise       Avitaminosis D - Primary    Chronic, previously on weekly supplement Repeat labs given mood disturbance improvement with effexor 150 mg and improved s/s       Relevant Orders   Vitamin D (25 hydroxy)   Moderate episode of recurrent major depressive disorder    Chronic, improving continues to endorse marital problems and financial concerns given her husband's habits Wishes to continue effexor 150 mg at this time      Prediabetes    Repeat A1c; not on dm medication at this time Continue to recommend balanced, lower carb meals. Smaller meal size, adding snacks. Choosing water as drink of choice and increasing purposeful exercise.       Relevant Orders   Hemoglobin A1c   Return if symptoms worsen or fail to improve.     Leilani Merl, FNP, have reviewed all  documentation for this visit. The documentation on 04/23/23 for the exam, diagnosis, procedures, and orders are all accurate and complete.  Jacky Kindle, FNP  [facility] [phone] (phone) [phone] (fax)  [facility]

## 2023-04-23 ENCOUNTER — Ambulatory Visit: Payer: Commercial Managed Care - PPO | Admitting: Family Medicine

## 2023-04-23 ENCOUNTER — Encounter: Payer: Self-pay | Admitting: Family Medicine

## 2023-04-23 VITALS — BP 127/69 | HR 74 | Temp 98.6°F | Ht 66.0 in | Wt 321.9 lb

## 2023-04-23 DIAGNOSIS — G35 Multiple sclerosis: Secondary | ICD-10-CM

## 2023-04-23 DIAGNOSIS — E559 Vitamin D deficiency, unspecified: Secondary | ICD-10-CM | POA: Diagnosis not present

## 2023-04-23 DIAGNOSIS — E039 Hypothyroidism, unspecified: Secondary | ICD-10-CM | POA: Diagnosis not present

## 2023-04-23 DIAGNOSIS — I10 Essential (primary) hypertension: Secondary | ICD-10-CM | POA: Diagnosis not present

## 2023-04-23 DIAGNOSIS — R7303 Prediabetes: Secondary | ICD-10-CM

## 2023-04-23 DIAGNOSIS — Z6841 Body Mass Index (BMI) 40.0 and over, adult: Secondary | ICD-10-CM

## 2023-04-23 DIAGNOSIS — F331 Major depressive disorder, recurrent, moderate: Secondary | ICD-10-CM

## 2023-04-23 NOTE — Assessment & Plan Note (Signed)
Chronic, stable Continues to have some urinary complaints and is followed by urology and reports improving symptoms on myrbertriq

## 2023-04-23 NOTE — Assessment & Plan Note (Signed)
Chronic, stable Continue hyzaar 50-12.5

## 2023-04-23 NOTE — Assessment & Plan Note (Signed)
Chronic, Body mass index is 51.96 kg/m. Discussed importance of healthy weight management Discussed diet and exercise

## 2023-04-23 NOTE — Assessment & Plan Note (Signed)
Chronic, improving continues to endorse marital problems and financial concerns given her husband's habits Wishes to continue effexor 150 mg at this time

## 2023-04-23 NOTE — Assessment & Plan Note (Signed)
Chronic, previously on weekly supplement Repeat labs given mood disturbance improvement with effexor 150 mg and improved s/s

## 2023-04-23 NOTE — Assessment & Plan Note (Signed)
Chronic, previously abnormal Repeat TSH panel Reports 150 mcg stable s/s of levothyroixine

## 2023-04-23 NOTE — Assessment & Plan Note (Signed)
Repeat A1c; not on dm medication at this time Continue to recommend balanced, lower carb meals. Smaller meal size, adding snacks. Choosing water as drink of choice and increasing purposeful exercise.

## 2023-04-24 ENCOUNTER — Other Ambulatory Visit: Payer: Self-pay | Admitting: Family Medicine

## 2023-04-24 ENCOUNTER — Encounter: Payer: Self-pay | Admitting: Family Medicine

## 2023-04-24 LAB — VITAMIN D 25 HYDROXY (VIT D DEFICIENCY, FRACTURES): Vit D, 25-Hydroxy: 38.2 ng/mL (ref 30.0–100.0)

## 2023-04-24 LAB — TSH+FREE T4
Free T4: 1.5 ng/dL (ref 0.82–1.77)
TSH: 0.205 u[IU]/mL — ABNORMAL LOW (ref 0.450–4.500)

## 2023-04-24 LAB — HEMOGLOBIN A1C
Est. average glucose Bld gHb Est-mCnc: 134 mg/dL
Hgb A1c MFr Bld: 6.3 % — ABNORMAL HIGH (ref 4.8–5.6)

## 2023-04-24 MED ORDER — METFORMIN HCL ER 750 MG PO TB24: 750.0000 mg | ORAL_TABLET | Freq: Two times a day (BID) | ORAL | 3 refills | Status: DC

## 2023-04-24 MED ORDER — LEVOTHYROXINE SODIUM 125 MCG PO TABS: 125.0000 ug | ORAL_TABLET | Freq: Every day | ORAL | 3 refills | Status: DC

## 2023-04-24 NOTE — Progress Notes (Signed)
Pre-diabetes is slightly worse; TSH is now overcorrected. Vit D is improving; however, continue high dose supplementation. Will send in new dose of synthroid and new start of metformin to assist with progression to type 2 diabetes.

## 2023-04-25 ENCOUNTER — Other Ambulatory Visit: Payer: Self-pay | Admitting: Family Medicine

## 2023-04-25 DIAGNOSIS — K219 Gastro-esophageal reflux disease without esophagitis: Secondary | ICD-10-CM

## 2023-04-25 MED ORDER — PANTOPRAZOLE SODIUM 40 MG PO TBEC
40.0000 mg | DELAYED_RELEASE_TABLET | Freq: Every day | ORAL | 3 refills | Status: DC
Start: 2023-04-25 — End: 2024-04-20

## 2023-05-14 NOTE — Progress Notes (Unsigned)
05/15/2023 12:53 PM   Tora Duck Larita Fife Ging [birth year] [id number]  Referring provider: Jacky Kindle, FNP [address] [place],  [place] [address]  Urological history 1. rUTI's -contributing factors of age, vaginal atrophy, MS, diabetes and obesity  March 27, 2019 for MDRO E. coli  July 20, 2022 mixed urogenital flora  July 12, 2022 E. Coli  June 26, 2022 E. Coli  June 14, 2022 mixed urogenital flora  June 03, 2022 E. Coli  2. Mixed incontinence -Contributing factors of age, vaginal atrophy, MS, obesity, hypertension, depression, sleep apnea and diuretics     No chief complaint on file.   HPI: Gao Blauer is a 62 y.o. female who presents today for 45-month follow-up.  At her visit on March 27, 2023, she stated she had been having issues with the incontinence since she was last seen here in July.  She was having to make frequent trips to the restroom with urgency.  She was also having episodes of urge incontinence.  Sometimes she will soak her incontinent pads.  She also has noted some stool incontinence.  She is not taking the oxybutynin. Patient denies any modifying or aggravating factors.  Patient denies any gross hematuria, dysuria or suprapubic/flank pain.  Patient denies any fevers, chills, nausea or vomiting.  UA yellow cloudy, specific gravity 1.020, trace blood, pH 6.0, 1+ protein, nitrate positive, 3+ leukocytes, greater than 30 WBCs, 3-10 RBCs, greater than 10 epithelial cells, hyaline casts are present, mucus threads are present and many bacteria.  KUB did not show significant constipation or stones.  PVR 12 mL.  Urine culture was positive for MDRO E.coli.   She was treated with culture appropriate antibiotics for 7 days.  She was also started on Myrbetriq 25 mg daily.  UA ***    PMH: Past Medical History:  Diagnosis Date   Agoraphobia with panic disorder    Anemia    Depression    Erosive gastritis    Fundic gland polyps of stomach, benign     Geographic tongue    GERD (gastroesophageal reflux disease)    History of esophagogastroduodenoscopy (EGD)    Hypercholesterolemia    Hypertension    Intervertebral disc disorder    thoracic,thoracolumber,lumbosacral   Multiple sclerosis (HCC)    Obesity    Sleep apnea     Surgical History: Past Surgical History:  Procedure Laterality Date   COLONOSCOPY     COLONOSCOPY WITH PROPOFOL N/A 07/07/2018   Procedure: COLONOSCOPY WITH PROPOFOL;  Surgeon: Christena Deem, MD;  Location: [facility] ENDOSCOPY;  Service: Endoscopy;  Laterality: N/A;   ESOPHAGOGASTRODUODENOSCOPY (EGD) WITH PROPOFOL N/A 01/31/2016   Procedure: ESOPHAGOGASTRODUODENOSCOPY (EGD) WITH PROPOFOL;  Surgeon: Christena Deem, MD;  Location: [facility] ENDOSCOPY;  Service: Endoscopy;  Laterality: N/A;   IR FLUORO GUIDE CV LINE RIGHT  10/06/2018   IR REMOVAL TUN CV CATH W/O FL  10/15/2018   IR US GUIDE VASC ACCESS RIGHT  10/06/2018   NISSEN FUNDOPLICATION      Home Medications:  Allergies as of 05/15/2023       Reactions   Shellfish Allergy Hives        Medication List        Accurate as of May 14, 2023 12:53 PM. If you have any questions, ask your nurse or doctor.          ARIPiprazole 10 MG tablet Commonly known as: ABILIFY Take 1 tablet (10 mg total) by mouth daily.   Armodafinil 200 MG Tabs Commonly  known as: Nuvigil One po qAM What changed: how much to take   ascorbic acid 500 MG tablet Commonly known as: VITAMIN C Take 500 mg by mouth daily.   aspirin 81 MG chewable tablet Chew 81 mg by mouth daily.   Fish Oil 500 MG Caps Take by mouth.   gabapentin 100 MG capsule Commonly known as: NEURONTIN TAKE 1 CAPSULE BY MOUTH TWICE A DAY   Glatiramer Acetate 40 MG/ML Sosy Inject into the skin.   HM Slow Release Iron 45 MG Tbcr Generic drug: Ferrous Sulfate Dried Take by mouth.   ketoconazole 2 % shampoo Commonly known as: NIZORAL APPLY 1 APPLICATION TOPICALLY 2 (TWO) TIMES A WEEK.   ketoconazole  2 % cream Commonly known as: NIZORAL Apply 1 application topically daily.   levothyroxine 125 MCG tablet Commonly known as: SYNTHROID Take 1 tablet (125 mcg total) by mouth daily.   losartan-hydrochlorothiazide 50-12.5 MG tablet Commonly known as: HYZAAR TAKE 1 TABLET BY MOUTH EVERY DAY   magic mouthwash (nystatin, hydrocortisone, diphenhydrAMINE) suspension Swish and swallow 10 mLs 4 (four) times daily as needed for mouth pain.   metFORMIN 750 MG 24 hr tablet Commonly known as: GLUCOPHAGE-XR Take 1 tablet (750 mg total) by mouth in the morning and at bedtime.   mirabegron ER 25 MG Tb24 tablet Commonly known as: MYRBETRIQ Take 1 tablet (25 mg total) by mouth daily.   nitrofurantoin (macrocrystal-monohydrate) 100 MG capsule Commonly known as: MACROBID Take 1 capsule (100 mg total) by mouth every 12 (twelve) hours.   pantoprazole 40 MG tablet Commonly known as: PROTONIX Take 1 tablet (40 mg total) by mouth daily.   phentermine 37.5 MG capsule Take 1 capsule (37.5 mg total) by mouth every morning.   polyethylene glycol powder 17 GM/SCOOP powder Commonly known as: GLYCOLAX/MIRALAX Take 17 g by mouth at bedtime.   prochlorperazine 10 MG tablet Commonly known as: COMPAZINE TAKE 1 TABLET (10 MG TOTAL) BY MOUTH IN THE MORNING, AT NOON, IN THE EVENING, AND AT BEDTIME.   rosuvastatin 20 MG tablet Commonly known as: CRESTOR TAKE 1 TABLET BY MOUTH EVERY DAY   traZODone 100 MG tablet Commonly known as: DESYREL TAKE 2 TABLETS BY MOUTH AT BEDTIME   venlafaxine XR 150 MG 24 hr capsule Commonly known as: EFFEXOR-XR Take 2 capsules (300 mg total) by mouth daily with breakfast.   Vitamin D (Ergocalciferol) 1.25 MG (50000 UNIT) Caps capsule Commonly known as: DRISDOL TAKE 1 CAPSULE (50,000 UNITS TOTAL) BY MOUTH EVERY 7 (SEVEN) DAYS   Vitamin D3 50 MCG (2000 UT) capsule Take 4,000 Units by mouth daily.        Allergies:  Allergies  Allergen Reactions   Shellfish Allergy  Hives    Family History: Family History  Problem Relation Age of Onset   Lung cancer Mother    Hypertension Mother    Alcohol abuse Mother    Anxiety disorder Mother    Hypertension Father    Arthritis Father    Alcohol abuse Father    Esophageal cancer Father    Alcohol abuse Brother    Hypertension Brother    Autism Son    Coronary artery disease Maternal Grandfather    Heart attack Maternal Grandfather    CVA Paternal Grandmother    Skin cancer Paternal Grandmother    Cancer Maternal Grandmother     Social History:  reports that she has never smoked. She has never used smokeless tobacco. She reports that she does not drink alcohol and does  not use drugs.   Physical Exam: LMP 08/06/2018   Constitutional:  Well nourished. Alert and oriented, No acute distress. HEENT: [place] AT, moist mucus membranes.  Trachea midline, no masses. Cardiovascular: No clubbing, cyanosis, or edema. Respiratory: Normal respiratory effort, no increased work of breathing. GU: No CVA tenderness.  No bladder fullness or masses. Vulvovaginal atrophy w/ pallor, loss of rugae, introital retraction, excoriations.  Vulvar thinning, fusion of labia, clitoral hood retraction, prominent urethral meatus.   *** external genitalia, *** pubic hair distribution, no lesions.  Normal urethral meatus, no lesions, no prolapse, no discharge.   No urethral masses, tenderness and/or tenderness. No bladder fullness, tenderness or masses. *** vagina mucosa, *** estrogen effect, no discharge, no lesions, *** pelvic support, *** cystocele and *** rectocele noted.  No cervical motion tenderness.  Uterus is freely mobile and non-fixed.  No adnexal/parametria masses or tenderness noted.  Anus and perineum are without rashes or lesions.   ***  Neurologic: Grossly intact, no focal deficits, moving all 4 extremities. Psychiatric: Normal mood and affect.    Laboratory Data: Hemoglobin A1c (04/2023) 6.3 Urinalysis See EPIC and HPI I  have reviewed the labs.   Pertinent Imaging ***  Assessment & Plan:    1.  Mixed Incontinence -At goal on Myrbetriq 25 mg daily ***  2. Microscopic hematuria -UA ***  Cloretta Ned  [facility] [address], [address] [place], [place] [address] [phone]

## 2023-05-15 ENCOUNTER — Encounter: Payer: Self-pay | Admitting: Urology

## 2023-05-15 ENCOUNTER — Ambulatory Visit: Payer: Commercial Managed Care - PPO | Admitting: Urology

## 2023-05-15 VITALS — BP 122/83 | HR 88 | Ht 66.0 in | Wt 321.0 lb

## 2023-05-15 DIAGNOSIS — N3946 Mixed incontinence: Secondary | ICD-10-CM | POA: Diagnosis not present

## 2023-05-15 DIAGNOSIS — R3129 Other microscopic hematuria: Secondary | ICD-10-CM | POA: Diagnosis not present

## 2023-05-15 LAB — URINALYSIS, COMPLETE
Bilirubin, UA: NEGATIVE
Glucose, UA: NEGATIVE
Ketones, UA: NEGATIVE
Nitrite, UA: NEGATIVE
RBC, UA: NEGATIVE
Specific Gravity, UA: 1.025 (ref 1.005–1.030)
Urobilinogen, Ur: 1 mg/dL (ref 0.2–1.0)
pH, UA: 6 (ref 5.0–7.5)

## 2023-05-15 LAB — MICROSCOPIC EXAMINATION: WBC, UA: >30 /hpf — AB (ref 0–5)

## 2023-05-15 LAB — BLADDER SCAN AMB NON-IMAGING: Scan Result: 0

## 2023-05-15 MED ORDER — MIRABEGRON ER 25 MG PO TB24
25.0000 mg | ORAL_TABLET | Freq: Every day | ORAL | 3 refills | Status: DC
Start: 2023-05-15 — End: 2023-09-04

## 2023-05-20 LAB — CULTURE, URINE COMPREHENSIVE

## 2023-06-30 ENCOUNTER — Other Ambulatory Visit: Payer: Self-pay | Admitting: Family Medicine

## 2023-06-30 DIAGNOSIS — E559 Vitamin D deficiency, unspecified: Secondary | ICD-10-CM

## 2023-07-06 ENCOUNTER — Other Ambulatory Visit: Payer: Self-pay | Admitting: Family Medicine

## 2023-07-06 DIAGNOSIS — I10 Essential (primary) hypertension: Secondary | ICD-10-CM

## 2023-07-08 NOTE — Telephone Encounter (Signed)
Requested Prescriptions  Refused Prescriptions Disp Refills   losartan-hydrochlorothiazide (HYZAAR) 50-12.5 MG tablet [Pharmacy Med Name: LOSARTAN-HCTZ 50-12.5 MG TAB] 90 tablet 1    Sig: TAKE 1 TABLET BY MOUTH EVERY DAY     Cardiovascular: ARB + Diuretic Combos Passed - 07/06/2023 11:51 AM      Passed - K in normal range and within 180 days    Potassium  Date Value Ref Range Status  01/15/2023 4.4 3.5 - 5.2 mmol/L Final         Passed - Na in normal range and within 180 days    Sodium  Date Value Ref Range Status  01/15/2023 141 134 - 144 mmol/L Final         Passed - Cr in normal range and within 180 days    Creatinine, Ser  Date Value Ref Range Status  01/15/2023 0.88 0.57 - 1.00 mg/dL Final         Passed - eGFR is 10 or above and within 180 days    GFR calc Af Amer  Date Value Ref Range Status  01/04/2021 60 >59 mL/min/1.73 Final    Comment:    **In accordance with recommendations from the NKF-ASN Task force,**   [facility] is in the process of updating its eGFR calculation to the   2021 CKD-EPI creatinine equation that estimates kidney function   without a race variable.    GFR calc non Af Amer  Date Value Ref Range Status  01/04/2021 52 (L) >59 mL/min/1.73 Final   eGFR  Date Value Ref Range Status  01/15/2023 76 >59 mL/min/1.73 Final         Passed - Patient is not pregnant      Passed - Last BP in normal range    BP Readings from Last 1 Encounters:  05/15/23 122/83         Passed - Valid encounter within last 6 months    Recent Outpatient Visits           2 months ago Avitaminosis D   [place] [facility] Merita Norton T, FNP   4 months ago Viral upper respiratory tract infection   [place] [facility] Merita Norton T, FNP   5 months ago Depression, recurrent [facility])   [place] [facility] Merita Norton T, FNP   12 months ago Urinary tract infection, recurrent   [place] [facility]  Merita Norton T, FNP   1 year ago Urinary tract infection, recurrent   [place] [facility] [place], Darl Householder, DO       Future Appointments             In 5 months McGowan, Elana Alm [facility]

## 2023-09-04 ENCOUNTER — Other Ambulatory Visit: Payer: Self-pay

## 2023-09-04 MED ORDER — MIRABEGRON ER 50 MG PO TB24: 50.0000 mg | ORAL_TABLET | Freq: Every day | ORAL | 2 refills | Status: DC

## 2023-09-12 ENCOUNTER — Other Ambulatory Visit: Payer: Self-pay | Admitting: Family Medicine

## 2023-09-12 DIAGNOSIS — F339 Major depressive disorder, recurrent, unspecified: Secondary | ICD-10-CM

## 2023-09-25 ENCOUNTER — Other Ambulatory Visit: Payer: Self-pay | Admitting: Family Medicine

## 2023-09-25 DIAGNOSIS — I10 Essential (primary) hypertension: Secondary | ICD-10-CM

## 2023-09-26 NOTE — Telephone Encounter (Signed)
Requested Prescriptions  Pending Prescriptions Disp Refills   losartan-hydrochlorothiazide (HYZAAR) 50-12.5 MG tablet [Pharmacy Med Name: LOSARTAN-HCTZ 50-12.5 MG TAB] 90 tablet 1    Sig: TAKE 1 TABLET BY MOUTH EVERY DAY     Cardiovascular: ARB + Diuretic Combos Failed - 09/25/2023  8:54 PM      Failed - K in normal range and within 180 days    Potassium  Date Value Ref Range Status  01/15/2023 4.4 3.5 - 5.2 mmol/L Final         Failed - Na in normal range and within 180 days    Sodium  Date Value Ref Range Status  01/15/2023 141 134 - 144 mmol/L Final         Failed - Cr in normal range and within 180 days    Creatinine, Ser  Date Value Ref Range Status  01/15/2023 0.88 0.57 - 1.00 mg/dL Final         Failed - eGFR is 10 or above and within 180 days    GFR calc Af Amer  Date Value Ref Range Status  01/04/2021 60 >59 mL/min/1.73 Final    Comment:    **In accordance with recommendations from the NKF-ASN Task force,**   [facility] is in the process of updating its eGFR calculation to the   2021 CKD-EPI creatinine equation that estimates kidney function   without a race variable.    GFR calc non Af Amer  Date Value Ref Range Status  01/04/2021 52 (L) >59 mL/min/1.73 Final   eGFR  Date Value Ref Range Status  01/15/2023 76 >59 mL/min/1.73 Final         Passed - Patient is not pregnant      Passed - Last BP in normal range    BP Readings from Last 1 Encounters:  05/15/23 122/83         Passed - Valid encounter within last 6 months    Recent Outpatient Visits           5 months ago Avitaminosis D   [place] [facility] Merita Norton T, FNP   7 months ago Viral upper respiratory tract infection   [place] [facility] Merita Norton T, FNP   8 months ago Depression, recurrent [facility])   [place] [facility] Merita Norton T, FNP   1 year ago Urinary tract infection, recurrent   [place] [facility]  Jacky Kindle, FNP   1 year ago Urinary tract infection, recurrent   [place] [facility] Rumball, Darl Householder, DO       Future Appointments             In 2 weeks MacDiarmid, Lorin Picket, MD [facility] [place]   In 2 months McGowan, Elana Alm [facility] [place]

## 2023-10-01 ENCOUNTER — Other Ambulatory Visit: Payer: Self-pay | Admitting: Family Medicine

## 2023-10-02 NOTE — Telephone Encounter (Signed)
Requested Prescriptions  Pending Prescriptions Disp Refills   rosuvastatin (CRESTOR) 20 MG tablet [Pharmacy Med Name: ROSUVASTATIN CALCIUM 20 MG TAB] 90 tablet 0    Sig: TAKE 1 TABLET BY MOUTH EVERY DAY     Cardiovascular:  Antilipid - Statins 2 Failed - 10/01/2023 12:28 PM      Failed - Lipid Panel in normal range within the last 12 months    Cholesterol, Total  Date Value Ref Range Status  01/15/2023 220 (H) 100 - 199 mg/dL Final   LDL Chol Calc (NIH)  Date Value Ref Range Status  01/15/2023 137 (H) 0 - 99 mg/dL Final   HDL  Date Value Ref Range Status  01/15/2023 33 (L) >39 mg/dL Final   Triglycerides  Date Value Ref Range Status  01/15/2023 278 (H) 0 - 149 mg/dL Final         Passed - Cr in normal range and within 360 days    Creatinine, Ser  Date Value Ref Range Status  01/15/2023 0.88 0.57 - 1.00 mg/dL Final         Passed - Patient is not pregnant      Passed - Valid encounter within last 12 months    Recent Outpatient Visits           5 months ago Avitaminosis D   [place] [facility] Merita Norton T, FNP   7 months ago Viral upper respiratory tract infection   [place] [facility] Merita Norton T, FNP   8 months ago Depression, recurrent [facility])   [place] [facility] Merita Norton T, FNP   1 year ago Urinary tract infection, recurrent    [facility] Merita Norton T, FNP   1 year ago Urinary tract infection, recurrent    [facility] [place], Darl Householder, DO       Future Appointments             In 1 week MacDiarmid, Lorin Picket, MD [facility] [place]   In 2 months McGowan, Elana Alm [facility] [place]

## 2023-10-14 ENCOUNTER — Encounter: Payer: Self-pay | Admitting: Urology

## 2023-10-14 ENCOUNTER — Ambulatory Visit: Payer: Commercial Managed Care - PPO | Admitting: Urology

## 2023-10-14 VITALS — BP 150/83 | HR 92

## 2023-10-14 DIAGNOSIS — N3946 Mixed incontinence: Secondary | ICD-10-CM

## 2023-10-14 LAB — URINALYSIS, COMPLETE
Bilirubin, UA: NEGATIVE
Glucose, UA: NEGATIVE
Ketones, UA: NEGATIVE
Nitrite, UA: POSITIVE — AB
RBC, UA: NEGATIVE
Specific Gravity, UA: >1.03 — ABNORMAL HIGH (ref 1.005–1.030)
Urobilinogen, Ur: 1 mg/dL (ref 0.2–1.0)
pH, UA: 5.5 (ref 5.0–7.5)

## 2023-10-14 LAB — MICROSCOPIC EXAMINATION
Epithelial Cells (non renal): >10 /[HPF] — AB (ref 0–10)
WBC, UA: >30 /[HPF] — AB (ref 0–5)

## 2023-10-14 NOTE — Patient Instructions (Signed)

## 2023-10-14 NOTE — Addendum Note (Signed)
Addended by: Sueanne Margarita on: 10/14/2023 09:23 AM   Modules accepted: Orders

## 2023-10-14 NOTE — Addendum Note (Signed)
Addended by: Sueanne Margarita on: 10/14/2023 04:03 PM   Modules accepted: Orders

## 2023-10-14 NOTE — Progress Notes (Signed)
10/14/2023 9:02 AM   Patricia Deleon Patricia Deleon 1961-12-05 [id number]  Referring provider: Jacky Kindle, FNP [address] [place],  [place] [address]  No chief complaint on file.   HPI: ST: Recurrent urinary tract infections with positive cultures and some resistance.  Recently provided Myrbetriq for mixed incontinence.  Initially responded very well to Myrbetriq.  Has multiple sclerosis.  Last culture positive  Today I was consulted to assess the patient's urinary continence.  She has urgency incontinence.  She leaks with coughing sneezing sometimes bending lifting.  She has mild bedwetting.  She wears 2 or 3 pads a day sometimes soaked.  Flow was good and she feels empty, urgency incontinence with very little warning as the primary symptom  She says she has not had bladder infections for almost a year presenting is frequency, sometimes discomfort, sometimes without symptoms.  She has had multiple sclerosis for 6 years.  She has not had a hysterectomy.  No history of kidney stones or bladder surgery.  Bowel function normal  On pelvic examination patient had mild grade 2 hypermobility the bladder neck and negative cough test.  No significant prolapse   PMH: Past Medical History:  Diagnosis Date   Agoraphobia with panic disorder    Anemia    Depression    Erosive gastritis    Fundic gland polyps of stomach, benign    Geographic tongue    GERD (gastroesophageal reflux disease)    History of esophagogastroduodenoscopy (EGD)    Hypercholesterolemia    Hypertension    Intervertebral disc disorder    thoracic,thoracolumber,lumbosacral   Multiple sclerosis (HCC)    Obesity    Sleep apnea     Surgical History: Past Surgical History:  Procedure Laterality Date   COLONOSCOPY     COLONOSCOPY WITH PROPOFOL N/A 07/07/2018   Procedure: COLONOSCOPY WITH PROPOFOL;  Surgeon: Christena Deem, MD;  Location: [facility] ENDOSCOPY;  Service: Endoscopy;  Laterality: N/A;    ESOPHAGOGASTRODUODENOSCOPY (EGD) WITH PROPOFOL N/A 01/31/2016   Procedure: ESOPHAGOGASTRODUODENOSCOPY (EGD) WITH PROPOFOL;  Surgeon: Christena Deem, MD;  Location: [facility] ENDOSCOPY;  Service: Endoscopy;  Laterality: N/A;   IR FLUORO GUIDE CV LINE RIGHT  10/06/2018   IR REMOVAL TUN CV CATH W/O FL  10/15/2018   IR US GUIDE VASC ACCESS RIGHT  10/06/2018   NISSEN FUNDOPLICATION      Home Medications:  Allergies as of 10/14/2023       Reactions   Shellfish Allergy Hives        Medication List        Accurate as of October 14, 2023  9:02 AM. If you have any questions, ask your nurse or doctor.          ARIPiprazole 10 MG tablet Commonly known as: ABILIFY Take 1 tablet (10 mg total) by mouth daily.   Armodafinil 200 MG Tabs Commonly known as: Nuvigil One po qAM What changed: how much to take   ascorbic acid 500 MG tablet Commonly known as: VITAMIN C Take 500 mg by mouth daily.   aspirin 81 MG chewable tablet Chew 81 mg by mouth daily.   Fish Oil 500 MG Caps Take by mouth.   gabapentin 100 MG capsule Commonly known as: NEURONTIN TAKE 1 CAPSULE BY MOUTH TWICE A DAY   Glatiramer Acetate 40 MG/ML Sosy Inject into the skin.   HM Slow Release Iron 45 MG Tbcr Generic drug: Ferrous Sulfate Dried Take by mouth.   ketoconazole 2 % shampoo Commonly known as: NIZORAL  APPLY 1 APPLICATION TOPICALLY 2 (TWO) TIMES A WEEK.   ketoconazole 2 % cream Commonly known as: NIZORAL Apply 1 application topically daily.   levothyroxine 125 MCG tablet Commonly known as: SYNTHROID Take 1 tablet (125 mcg total) by mouth daily.   losartan-hydrochlorothiazide 50-12.5 MG tablet Commonly known as: HYZAAR TAKE 1 TABLET BY MOUTH EVERY DAY   magic mouthwash (nystatin, hydrocortisone, diphenhydrAMINE) suspension Swish and swallow 10 mLs 4 (four) times daily as needed for mouth pain.   metFORMIN 750 MG 24 hr tablet Commonly known as: GLUCOPHAGE-XR Take 1 tablet (750 mg total) by mouth  in the morning and at bedtime.   mirabegron ER 50 MG Tb24 tablet Commonly known as: MYRBETRIQ Take 1 tablet (50 mg total) by mouth daily.   nitrofurantoin (macrocrystal-monohydrate) 100 MG capsule Commonly known as: MACROBID Take 1 capsule (100 mg total) by mouth every 12 (twelve) hours.   pantoprazole 40 MG tablet Commonly known as: PROTONIX Take 1 tablet (40 mg total) by mouth daily.   phentermine 37.5 MG capsule Take 1 capsule (37.5 mg total) by mouth every morning.   polyethylene glycol powder 17 GM/SCOOP powder Commonly known as: GLYCOLAX/MIRALAX Take 17 g by mouth at bedtime.   prochlorperazine 10 MG tablet Commonly known as: COMPAZINE TAKE 1 TABLET (10 MG TOTAL) BY MOUTH IN THE MORNING, AT NOON, IN THE EVENING, AND AT BEDTIME.   rosuvastatin 20 MG tablet Commonly known as: CRESTOR TAKE 1 TABLET BY MOUTH EVERY DAY   traZODone 100 MG tablet Commonly known as: DESYREL TAKE 2 TABLETS BY MOUTH AT BEDTIME   venlafaxine XR 150 MG 24 hr capsule Commonly known as: EFFEXOR-XR TAKE 2 CAPSULES (300 MG TOTAL) BY MOUTH DAILY WITH BREAKFAST   Vitamin D (Ergocalciferol) 1.25 MG (50000 UNIT) Caps capsule Commonly known as: DRISDOL TAKE 1 CAPSULE (50,000 UNITS TOTAL) BY MOUTH EVERY 7 (SEVEN) DAYS   Vitamin D3 50 MCG (2000 UT) capsule Take 4,000 Units by mouth daily.        Allergies:  Allergies  Allergen Reactions   Shellfish Allergy Hives    Family History: Family History  Problem Relation Age of Onset   Lung cancer Mother    Hypertension Mother    Alcohol abuse Mother    Anxiety disorder Mother    Hypertension Father    Arthritis Father    Alcohol abuse Father    Esophageal cancer Father    Alcohol abuse Brother    Hypertension Brother    Autism Son    Coronary artery disease Maternal Grandfather    Heart attack Maternal Grandfather    CVA Paternal Grandmother    Skin cancer Paternal Grandmother    Cancer Maternal Grandmother     Social History:   reports that she has never smoked. She has never used smokeless tobacco. She reports that she does not drink alcohol and does not use drugs.  ROS:                                        Physical Exam: LMP 08/06/2018   Constitutional:  Alert and oriented, No acute distress. HEENT: [place] AT, moist mucus membranes.  Trachea midline, no masses.  Laboratory Data: Lab Results  Component Value Date   WBC 3.4 01/15/2023   HGB 13.6 01/15/2023   HCT 41.1 01/15/2023   MCV 87 01/15/2023   PLT 188 01/15/2023    Lab Results  Component Value Date   CREATININE 0.88 01/15/2023    No results found for: "PSA"  No results found for: "TESTOSTERONE"  Lab Results  Component Value Date   HGBA1C 6.3 (H) 04/23/2023    Urinalysis    Component Value Date/Time   APPEARANCEUR Clear 05/15/2023 1118   GLUCOSEU Negative 05/15/2023 1118   BILIRUBINUR Negative 05/15/2023 1118   KETONESUR trace (5) (A) 06/03/2022 1147   PROTEINUR Trace (A) 05/15/2023 1118   UROBILINOGEN 1.0 07/11/2022 1055   NITRITE Negative 05/15/2023 1118   LEUKOCYTESUR Trace (A) 05/15/2023 1118    Pertinent Imaging: Urine reviewed and sent for culture  Assessment & Plan: Patient has mixed incontinence and multiple sclerosis.  She has milder bedwetting.  She will return for urodynamics and cystoscopy.  She is a partial responder to [employer] and has not tried other medication.  Call if culture positive.  May or may not need prophylaxis in the future  1. Mixed stress and urge urinary incontinence  - Urinalysis, Complete   No follow-ups on file.  Martina Sinner, MD  [facility] [address], [address] [place], [place] [address] [phone]

## 2023-10-16 LAB — CULTURE, URINE COMPREHENSIVE

## 2023-10-25 ENCOUNTER — Other Ambulatory Visit: Payer: Self-pay | Admitting: Family Medicine

## 2023-10-25 DIAGNOSIS — M5431 Sciatica, right side: Secondary | ICD-10-CM

## 2023-10-27 ENCOUNTER — Other Ambulatory Visit: Payer: Self-pay | Admitting: Family Medicine

## 2023-10-27 DIAGNOSIS — E559 Vitamin D deficiency, unspecified: Secondary | ICD-10-CM

## 2023-10-29 ENCOUNTER — Telehealth: Payer: Self-pay | Admitting: Family Medicine

## 2023-10-29 DIAGNOSIS — M5431 Sciatica, right side: Secondary | ICD-10-CM

## 2023-10-29 MED ORDER — GABAPENTIN 100 MG PO CAPS: 100.0000 mg | ORAL_CAPSULE | Freq: Two times a day (BID) | ORAL | 3 refills | Status: DC

## 2023-10-29 NOTE — Telephone Encounter (Signed)
Copied from CRM [id number]. Topic: General - Other >> Oct 29, 2023  9:07 AM Everette C wrote: Reason for CRM: Medication Refill - Medication: gabapentin (NEURONTIN) 100 MG capsule [[id number]]    Has the patient contacted their pharmacy? Yes.   (Agent: If no, request that the patient contact the pharmacy for the refill. If patient does not wish to contact the pharmacy document the reason why and proceed with request.) (Agent: If yes, when and what did the pharmacy advise?)  Preferred Pharmacy (with phone number or street name): [facility] [address] [place] - [address] [place] [address] Phone: [phone] Fax: [phone] Hours: Not open 24 hours   Has the patient been seen for an appointment in the last year OR does the patient have an upcoming appointment? Yes.    Agent: Please be advised that RX refills may take up to 3 business days. We ask that you follow-up with your pharmacy.

## 2023-10-29 NOTE — Telephone Encounter (Signed)
Refilled

## 2023-11-14 ENCOUNTER — Ambulatory Visit: Payer: Commercial Managed Care - PPO | Admitting: Family Medicine

## 2023-11-14 ENCOUNTER — Encounter: Payer: Self-pay | Admitting: Family Medicine

## 2023-11-14 VITALS — BP 134/85 | HR 88 | Temp 98.4°F | Ht 66.0 in | Wt 316.0 lb

## 2023-11-14 DIAGNOSIS — I10 Essential (primary) hypertension: Secondary | ICD-10-CM

## 2023-11-14 DIAGNOSIS — H66002 Acute suppurative otitis media without spontaneous rupture of ear drum, left ear: Secondary | ICD-10-CM | POA: Diagnosis not present

## 2023-11-14 DIAGNOSIS — G35D Multiple sclerosis, unspecified: Secondary | ICD-10-CM

## 2023-11-14 DIAGNOSIS — G35 Multiple sclerosis: Secondary | ICD-10-CM

## 2023-11-14 DIAGNOSIS — F331 Major depressive disorder, recurrent, moderate: Secondary | ICD-10-CM | POA: Diagnosis not present

## 2023-11-14 DIAGNOSIS — Z6841 Body Mass Index (BMI) 40.0 and over, adult: Secondary | ICD-10-CM

## 2023-11-14 DIAGNOSIS — E66813 Obesity, class 3: Secondary | ICD-10-CM

## 2023-11-14 MED ORDER — CIPROFLOXACIN-DEXAMETHASONE 0.3-0.1 % OT SUSP: 4.0000 [drp] | Freq: Two times a day (BID) | OTIC | 0 refills | Status: DC

## 2023-11-14 MED ORDER — LOSARTAN POTASSIUM-HCTZ 50-12.5 MG PO TABS
2.0000 | ORAL_TABLET | Freq: Every day | ORAL | Status: DC
Start: 2023-11-14 — End: 2023-12-13

## 2023-11-14 NOTE — Assessment & Plan Note (Signed)
Chronic, stable F/b mult specialists

## 2023-11-14 NOTE — Progress Notes (Signed)
Established patient visit  Patient: Patricia Deleon   DOB: 1961-07-14   62 y.o. Female  MRN: [id number] Visit Date: 11/14/2023  Today's healthcare provider: Jacky Kindle, FNP  Re Introduced to nurse practitioner role and practice setting.  All questions answered.  Discussed provider/patient relationship and expectations.  Chief Complaint  Patient presents with   Ear Pain    Pt stated--left ear pain, aching, nausea, fever--started yesterday.   Subjective    HPI HPI     Ear Pain    Additional comments: Pt stated--left ear pain, aching, nausea, fever--started yesterday.      Last edited by Shelly Bombard, CMA on 11/14/2023  3:17 PM.      Medications: Outpatient Medications Prior to Visit  Medication Sig   ARIPiprazole (ABILIFY) 10 MG tablet Take 1 tablet (10 mg total) by mouth daily.   Armodafinil (NUVIGIL) 200 MG TABS One po qAM (Patient taking differently: 250 mg. One po qAM)   aspirin 81 MG chewable tablet Chew 81 mg by mouth daily.   Cholecalciferol (VITAMIN D3) 2000 UNITS capsule Take 4,000 Units by mouth daily.    Ferrous Sulfate Dried (HM SLOW RELEASE IRON) 45 MG TBCR Take by mouth.   gabapentin (NEURONTIN) 100 MG capsule Take 1 capsule (100 mg total) by mouth 2 (two) times daily.   Glatiramer Acetate 40 MG/ML SOSY Inject into the skin.   ketoconazole (NIZORAL) 2 % cream Apply 1 application topically daily.   ketoconazole (NIZORAL) 2 % shampoo APPLY 1 APPLICATION TOPICALLY 2 (TWO) TIMES A WEEK.   levothyroxine (SYNTHROID) 125 MCG tablet Take 1 tablet (125 mcg total) by mouth daily.   magic mouthwash (nystatin, hydrocortisone, diphenhydrAMINE) suspension Swish and swallow 10 mLs 4 (four) times daily as needed for mouth pain.   metFORMIN (GLUCOPHAGE-XR) 750 MG 24 hr tablet Take 1 tablet (750 mg total) by mouth in the morning and at bedtime.   mirabegron ER (MYRBETRIQ) 50 MG TB24 tablet Take 1 tablet (50 mg total) by mouth daily.   nitrofurantoin,  macrocrystal-monohydrate, (MACROBID) 100 MG capsule Take 1 capsule (100 mg total) by mouth every 12 (twelve) hours.   Omega-3 Fatty Acids (FISH OIL) 500 MG CAPS Take by mouth.   pantoprazole (PROTONIX) 40 MG tablet Take 1 tablet (40 mg total) by mouth daily.   phentermine 37.5 MG capsule Take 1 capsule (37.5 mg total) by mouth every morning.   polyethylene glycol powder (GLYCOLAX/MIRALAX) 17 GM/SCOOP powder Take 17 g by mouth at bedtime.    prochlorperazine (COMPAZINE) 10 MG tablet TAKE 1 TABLET (10 MG TOTAL) BY MOUTH IN THE MORNING, AT NOON, IN THE EVENING, AND AT BEDTIME.   rosuvastatin (CRESTOR) 20 MG tablet TAKE 1 TABLET BY MOUTH EVERY DAY   traZODone (DESYREL) 100 MG tablet TAKE 2 TABLETS BY MOUTH AT BEDTIME   venlafaxine XR (EFFEXOR-XR) 150 MG 24 hr capsule TAKE 2 CAPSULES (300 MG TOTAL) BY MOUTH DAILY WITH BREAKFAST   vitamin C (ASCORBIC ACID) 500 MG tablet Take 500 mg by mouth daily.   Vitamin D, Ergocalciferol, (DRISDOL) 1.25 MG (50000 UNIT) CAPS capsule TAKE 1 CAPSULE (50,000 UNITS TOTAL) BY MOUTH EVERY 7 (SEVEN) DAYS   [DISCONTINUED] losartan-hydrochlorothiazide (HYZAAR) 50-12.5 MG tablet TAKE 1 TABLET BY MOUTH EVERY DAY   No facility-administered medications prior to visit.       Objective    BP 134/85 (BP Location: Left Arm, Patient Position: Sitting, Cuff Size: Large)   Pulse 88   Temp 98.4 F (36.9 C)  Ht 5\' 6"  (1.676 m)   Wt (!) 316 lb (143.3 kg)   LMP 08/06/2018   SpO2 98%   BMI 51.00 kg/m   Physical Exam Vitals and nursing note reviewed.  Constitutional:      General: She is not in acute distress.    Appearance: Normal appearance. She is obese. She is ill-appearing. She is not toxic-appearing or diaphoretic.  HENT:     Head: Normocephalic and atraumatic.     Right Ear: Tympanic membrane, ear canal and external ear normal.     Left Ear: Swelling and tenderness present. A middle ear effusion is present.     Nose: Congestion present.     Mouth/Throat:      Mouth: Mucous membranes are moist.     Pharynx: Oropharynx is clear.  Cardiovascular:     Rate and Rhythm: Normal rate and regular rhythm.     Pulses: Normal pulses.     Heart sounds: Normal heart sounds. No murmur heard.    No friction rub. No gallop.  Pulmonary:     Effort: Pulmonary effort is normal. No respiratory distress.     Breath sounds: Normal breath sounds. No stridor. No wheezing, rhonchi or rales.  Chest:     Chest wall: No tenderness.  Musculoskeletal:        General: No swelling, tenderness, deformity or signs of injury. Normal range of motion.     Right lower leg: No edema.     Left lower leg: No edema.  Skin:    General: Skin is warm and dry.     Capillary Refill: Capillary refill takes less than 2 seconds.     Coloration: Skin is not jaundiced or pale.     Findings: No bruising, erythema, lesion or rash.  Neurological:     General: No focal deficit present.     Mental Status: She is alert and oriented to person, place, and time. Mental status is at baseline.     Cranial Nerves: No cranial nerve deficit.     Sensory: No sensory deficit.     Motor: No weakness.     Coordination: Coordination normal.  Psychiatric:        Mood and Affect: Mood normal.        Behavior: Behavior normal.        Thought Content: Thought content normal.        Judgment: Judgment normal.     No results found for any visits on 11/14/23.  Assessment & Plan     Problem List Items Addressed This Visit       Cardiovascular and Mediastinum   Hypertension    Chronic, remains elevated Recommend increase in hyzaar to assist in meeting goal 1 month f/u recommended or sooner to assist with HTN f/u and lab follow up       Relevant Medications   losartan-hydrochlorothiazide (HYZAAR) 50-12.5 MG tablet     Nervous and Auditory   Multiple sclerosis (HCC)    Chronic, stable F/b mult specialists       Non-recurrent acute suppurative otitis media of left ear without spontaneous rupture  of tympanic membrane - Primary    Acute x 2 days; grandson is sick as well Complaints of other URI symptoms as well as GI symptoms; unclear which came first Nausea has subsided  Continue supportive care measures in addition to local abx/steroid combo         Other   Adiposity    Chronic obesity Body mass index  is 51 kg/m. Discussed importance of healthy weight management Discussed diet and exercise       Moderate episode of recurrent major depressive disorder (HCC)   Otitis Media Left ear infection with associated hearing loss. No over-the-counter treatments used. -Prescribe Ciprodex drops for left ear.  Hypertension Elevated blood pressure readings at recent appointments, including today's visit. Currently on Losartan 50mg  and Hydrochlorothiazide 12.5mg . -Increase Hyzaar to two tablets daily, either together or split between morning and night. -Plan to check labs in one month to assess potassium levels with increased diuretic dose.  Upper Respiratory Symptoms Recent onset of headache, nausea with vomiting, and nasal discharge. Possible recent sinus infection. No flu vaccine this season. Family member also ill. -Advise use of Flonase or Vicks nasal spray, avoiding Sudafedrin due to potential for increasing blood pressure. -Return to work on Saturday if symptoms improve.  Follow-up in one month to assess response to increased blood pressure medication and to check labs. If symptoms do not improve, patient to contact clinic.  Return in about 4 weeks (around 12/12/2023) for HTN management.     Leilani Merl, FNP, have reviewed all documentation for this visit. The documentation on 11/14/23 for the exam, diagnosis, procedures, and orders are all accurate and complete.  Jacky Kindle, FNP  [facility] [phone] (phone) [phone] (fax)  [facility]

## 2023-11-14 NOTE — Assessment & Plan Note (Signed)
Chronic obesity Body mass index is 51 kg/m. Discussed importance of healthy weight management Discussed diet and exercise

## 2023-11-14 NOTE — Assessment & Plan Note (Signed)
Acute x 2 days; grandson is sick as well Complaints of other URI symptoms as well as GI symptoms; unclear which came first Nausea has subsided  Continue supportive care measures in addition to local abx/steroid combo

## 2023-11-14 NOTE — Assessment & Plan Note (Signed)
Chronic; exacerbated given illness and stress Declines medication adjustment Continue on Abilify 10 mg daily and Nuvigil

## 2023-11-14 NOTE — Assessment & Plan Note (Signed)
Chronic, remains elevated Recommend increase in hyzaar to assist in meeting goal 1 month f/u recommended or sooner to assist with HTN f/u and lab follow up

## 2023-11-14 NOTE — Patient Instructions (Signed)
Some things that can make you feel better are: - Increased rest - Increasing Fluids - Acetaminophen / ibuprofen as needed for fever/pain.  - Salt water gargling, chloraseptic spray and throat lozenges - OTC pseudoephedrine.  - Mucinex.  - Saline sinus flushes or a neti pot.  - Humidifying the air.  

## 2023-11-22 ENCOUNTER — Other Ambulatory Visit: Payer: Self-pay | Admitting: Family Medicine

## 2023-11-22 ENCOUNTER — Encounter: Payer: Self-pay | Admitting: Family Medicine

## 2023-11-22 DIAGNOSIS — H9190 Unspecified hearing loss, unspecified ear: Secondary | ICD-10-CM

## 2023-11-22 DIAGNOSIS — H66002 Acute suppurative otitis media without spontaneous rupture of ear drum, left ear: Secondary | ICD-10-CM

## 2023-11-23 ENCOUNTER — Ambulatory Visit
Admission: RE | Admit: 2023-11-23 | Discharge: 2023-11-23 | Disposition: A | Discharge status: Home / Self Care | Payer: Commercial Managed Care - PPO | Source: Ambulatory Visit | Attending: Emergency Medicine | Admitting: Emergency Medicine

## 2023-11-23 DIAGNOSIS — H6502 Acute serous otitis media, left ear: Secondary | ICD-10-CM

## 2023-11-23 MED ORDER — AMOXICILLIN-POT CLAVULANATE 875-125 MG PO TABS: 1.0000 | ORAL_TABLET | Freq: Two times a day (BID) | ORAL | 0 refills | Status: AC

## 2023-11-23 NOTE — ED Provider Notes (Signed)
Renaldo Fiddler    CSN: [id number] Arrival date & time: 11/23/23  1240      History   Chief Complaint Chief Complaint  Patient presents with   Ear Fullness    I've had an ear infection for a week. My doctor [facility] to [facility]. - Entered by patient    HPI Breann Clestine Risner is a 62 y.o. female.   She presents for evaluation of persisting left-sided ear pain, fullness and decreased hearing present for approximately 2 weeks.  Was seen by PCP who started Ciprodex for treatment but no improvement seen.  Encourage patient to be reseen for evaluation.  Denies drainage or fever.  Has been experience congestion for 2 to 3 weeks, believed at 1 point to have a sinusitis.  Blood pressure 130/67, heart rate 86, O2 saturation 96% on room air, temperature 98.6, respirations 19  Past Medical History:  Diagnosis Date   Agoraphobia with panic disorder    Anemia    Depression    Erosive gastritis    Fundic gland polyps of stomach, benign    Geographic tongue    GERD (gastroesophageal reflux disease)    History of esophagogastroduodenoscopy (EGD)    Hypercholesterolemia    Hypertension    Intervertebral disc disorder    thoracic,thoracolumber,lumbosacral   Multiple sclerosis (HCC)    Obesity    Sleep apnea     Patient Active Problem List   Diagnosis Date Noted   Non-recurrent acute suppurative otitis media of left ear without spontaneous rupture of tympanic membrane 11/14/2023   COVID-19 02/26/2023   Prediabetes 01/15/2023   Morbid obesity (HCC) 01/15/2023   Mixed hyperlipidemia 01/15/2023   Encounter for screening mammogram for malignant neoplasm of breast 01/15/2023   Depression, recurrent (HCC) 01/15/2023   Urinary tract infection, recurrent 07/11/2022   UTI symptoms 06/14/2022   Elevated glucose 11/14/2021   Flu vaccine need 10/25/2021   Skin infection 12/25/2020   Fungal infection 12/25/2020   Moderate episode of recurrent major depressive disorder  (HCC) 05/28/2019   Right optic neuritis 08/25/2018   Excessive daytime sleepiness 08/25/2018   Multiple sclerosis (HCC) 07/08/2018   Neck pain 07/08/2018   Transient vision disturbance of left eye 06/18/2018   Headache 06/18/2018   Urge incontinence 06/18/2018   Numbness 06/18/2018   Acne rosacea 05/07/2016   Agoraphobia with panic disorder 09/28/2015   Clinical depression 09/28/2015   Displacement of lumbar intervertebral disc without myelopathy 09/28/2015   Hypercholesteremia 09/28/2015   Insomnia 09/28/2015   Thoracic, 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   Anemia, iron deficiency 09/28/2015   Adiposity 09/28/2015   Episodic paroxysmal anxiety disorder 09/28/2015   Borderline diabetes 09/28/2015   Restless leg 09/28/2015   Apnea, sleep 09/28/2015   Avitaminosis D 09/28/2015   Hypothyroidism 07/11/2015   Hypertension 06/09/2015    Past Surgical History:  Procedure Laterality Date   COLONOSCOPY     COLONOSCOPY WITH PROPOFOL N/A 07/07/2018   Procedure: COLONOSCOPY WITH PROPOFOL;  Surgeon: Christena Deem, MD;  Location: [facility] ENDOSCOPY;  Service: Endoscopy;  Laterality: N/A;   ESOPHAGOGASTRODUODENOSCOPY (EGD) WITH PROPOFOL N/A 01/31/2016   Procedure: ESOPHAGOGASTRODUODENOSCOPY (EGD) WITH PROPOFOL;  Surgeon: Christena Deem, MD;  Location: [facility] ENDOSCOPY;  Service: Endoscopy;  Laterality: N/A;   IR FLUORO GUIDE CV LINE RIGHT  10/06/2018   IR REMOVAL TUN CV CATH W/O FL  10/15/2018   IR US GUIDE VASC ACCESS RIGHT  10/06/2018   NISSEN FUNDOPLICATION  OB History   No obstetric history on file.      Home Medications    Prior to Admission medications   Medication Sig Start Date End Date Taking? Authorizing Provider  amoxicillin-clavulanate (AUGMENTIN) 875-125 MG tablet Take 1 tablet by mouth every 12 (twelve) hours for 10 days. 11/23/23 12/03/23 Yes Marieta Markov R, NP  ARIPiprazole (ABILIFY) 10 MG tablet Take 1 tablet (10 mg total) by  mouth daily. 10/25/21   Jacky Kindle, FNP  Armodafinil (NUVIGIL) 200 MG TABS One po qAM Patient taking differently: 250 mg. One po qAM 10/25/21   Jacky Kindle, FNP  aspirin 81 MG chewable tablet Chew 81 mg by mouth daily.    [provider]  Cholecalciferol (VITAMIN D3) 2000 UNITS capsule Take 4,000 Units by mouth daily.     [provider]  ciprofloxacin-dexamethasone (CIPRODEX) OTIC suspension Place 4 drops into the left ear 2 (two) times daily. 11/14/23   Jacky Kindle, FNP  Ferrous Sulfate Dried (HM SLOW RELEASE IRON) 45 MG TBCR Take by mouth.    [provider]  gabapentin (NEURONTIN) 100 MG capsule Take 1 capsule (100 mg total) by mouth 2 (two) times daily. 10/29/23   Jacky Kindle, FNP  Glatiramer Acetate 40 MG/ML SOSY Inject into the skin.    [provider]  ketoconazole (NIZORAL) 2 % cream Apply 1 application topically daily. 10/25/21   Jacky Kindle, FNP  ketoconazole (NIZORAL) 2 % shampoo APPLY 1 APPLICATION TOPICALLY 2 (TWO) TIMES A WEEK. 08/24/21   Jacky Kindle, FNP  levothyroxine (SYNTHROID) 125 MCG tablet Take 1 tablet (125 mcg total) by mouth daily. 04/24/23   Jacky Kindle, FNP  losartan-hydrochlorothiazide (HYZAAR) 50-12.5 MG tablet Take 2 tablets by mouth daily. 11/14/23   Jacky Kindle, FNP  magic mouthwash (nystatin, hydrocortisone, diphenhydrAMINE) suspension Swish and swallow 10 mLs 4 (four) times daily as needed for mouth pain. 10/25/21   Jacky Kindle, FNP  metFORMIN (GLUCOPHAGE-XR) 750 MG 24 hr tablet Take 1 tablet (750 mg total) by mouth in the morning and at bedtime. 04/24/23   Jacky Kindle, FNP  mirabegron ER (MYRBETRIQ) 50 MG TB24 tablet Take 1 tablet (50 mg total) by mouth daily. 09/04/23   [place], Wellington Hampshire, PA-C  nitrofurantoin, macrocrystal-monohydrate, (MACROBID) 100 MG capsule Take 1 capsule (100 mg total) by mouth every 12 (twelve) hours. 04/01/23   Michiel Cowboy A, PA-C  Omega-3 Fatty Acids (FISH OIL) 500 MG CAPS  Take by mouth.    [provider]  pantoprazole (PROTONIX) 40 MG tablet Take 1 tablet (40 mg total) by mouth daily. 04/25/23   Jacky Kindle, FNP  phentermine 37.5 MG capsule Take 1 capsule (37.5 mg total) by mouth every morning. 01/15/23   Jacky Kindle, FNP  polyethylene glycol powder (GLYCOLAX/MIRALAX) 17 GM/SCOOP powder Take 17 g by mouth at bedtime.     [provider]  prochlorperazine (COMPAZINE) 10 MG tablet TAKE 1 TABLET (10 MG TOTAL) BY MOUTH IN THE MORNING, AT NOON, IN THE EVENING, AND AT BEDTIME. 01/14/23   Jacky Kindle, FNP  rosuvastatin (CRESTOR) 20 MG tablet TAKE 1 TABLET BY MOUTH EVERY DAY 10/02/23   Merita Norton T, FNP  traZODone (DESYREL) 100 MG tablet TAKE 2 TABLETS BY MOUTH AT BEDTIME 12/12/22   Jacky Kindle, FNP  venlafaxine XR (EFFEXOR-XR) 150 MG 24 hr capsule TAKE 2 CAPSULES (300 MG TOTAL) BY MOUTH DAILY WITH BREAKFAST 09/12/23   Jacky Kindle, FNP  vitamin C (ASCORBIC ACID) 500 MG tablet Take 500 mg by mouth daily.    [provider]  Vitamin D, Ergocalciferol, (DRISDOL) 1.25 MG (50000 UNIT) CAPS capsule TAKE 1 CAPSULE (50,000 UNITS TOTAL) BY MOUTH EVERY 7 (SEVEN) DAYS 10/28/23   Jacky Kindle, FNP    Family History Family History  Problem Relation Age of Onset   Lung cancer Mother    Hypertension Mother    Alcohol abuse Mother    Anxiety disorder Mother    Hypertension Father    Arthritis Father    Alcohol abuse Father    Esophageal cancer Father    Alcohol abuse Brother    Hypertension Brother    Autism Son    Coronary artery disease Maternal Grandfather    Heart attack Maternal Grandfather    CVA Paternal Grandmother    Skin cancer Paternal Grandmother    Cancer Maternal Grandmother     Social History Social History   Tobacco Use   Smoking status: Never   Smokeless tobacco: Never  Vaping Use   Vaping status: Never Used  Substance Use Topics   Alcohol use: No    Alcohol/week: 0.0 standard drinks of alcohol   Drug use:  No     Allergies   Shellfish allergy   Review of Systems Review of Systems   Physical Exam Triage Vital Signs ED Triage Vitals [11/23/23 1306]  Encounter Vitals Group     BP      Systolic BP Percentile      Diastolic BP Percentile      Pulse      Resp      Temp      Temp src      SpO2      Weight      Height      Head Circumference      Peak Flow      Pain Score 0     Pain Loc      Pain Education      Exclude from Growth Chart    No data found.  Updated Vital Signs LMP 08/06/2018   Visual Acuity Right Eye Distance:   Left Eye Distance:   Bilateral Distance:    Right Eye Near:   Left Eye Near:    Bilateral Near:     Physical Exam Constitutional:      Appearance: Normal appearance.  HENT:     Right Ear: Hearing, tympanic membrane, ear canal and external ear normal.     Left Ear: Hearing, ear canal and external ear normal. Tympanic membrane is erythematous.  Eyes:     Extraocular Movements: Extraocular movements intact.  Pulmonary:     Effort: Pulmonary effort is normal.  Neurological:     Mental Status: She is alert and oriented to person, place, and time.      UC Treatments / Results  Labs (all labs ordered are listed, but only abnormal results are displayed) Labs Reviewed - No data to display  EKG   Radiology No results found.  Procedures Procedures (including critical care time)  Medications Ordered in UC Medications - No data to display  Initial Impression / Assessment and Plan / UC Course  I have reviewed the triage vital signs and the nursing notes.  Pertinent labs & imaging results that were available during my care of the patient were reviewed by me and considered in my medical decision making (see chart for details).  Nonrecurrent acute serous otitis media of the  left ear  Presentation is consistent with infection to the eardrum, no abnormalities to the ear canal or external ear, discussed findings with patient, prescribed  Augmentin and recommended over-the-counter analgesics and warm compresses to the external ear for comfort advised against ear cleaning during treatment and advised follow-up if symptoms do not improve Final Clinical Impressions(s) / UC Diagnoses   Final diagnoses:  Non-recurrent acute serous otitis media of left ear     Discharge Instructions      Today you are being treated for an infection of the eardrum  Take augmentin  twice daily for 10 days, you should begin to see improvement after 48 hours of medication use and then it should progressively get better  You may use Tylenol or ibuprofen for management of discomfort  May hold warm compresses to the ear for additional comfort  Please not attempted any ear cleaning or object or fluid placement into the ear canal to prevent further irritation    ED Prescriptions     Medication Sig Dispense Auth. Provider   amoxicillin-clavulanate (AUGMENTIN) 875-125 MG tablet Take 1 tablet by mouth every 12 (twelve) hours for 10 days. 20 tablet Valinda Hoar, NP      PDMP not reviewed this encounter.   Valinda Hoar, NP 11/23/23 1338

## 2023-11-23 NOTE — Discharge Instructions (Signed)
Today you are being treated for an infection of the eardrum  Take augmentin twice daily for 10 days, you should begin to see improvement after 48 hours of medication use and then it should progressively get better  You may use Tylenol or ibuprofen for management of discomfort  May hold warm compresses to the ear for additional comfort  Please not attempted any ear cleaning or object or fluid placement into the ear canal to prevent further irritation

## 2023-11-23 NOTE — ED Notes (Signed)
Provider triaged.  

## 2023-11-25 ENCOUNTER — Other Ambulatory Visit: Payer: Commercial Managed Care - PPO | Admitting: Urology

## 2023-12-13 ENCOUNTER — Ambulatory Visit: Payer: Commercial Managed Care - PPO | Admitting: Family Medicine

## 2023-12-13 ENCOUNTER — Encounter: Payer: Self-pay | Admitting: Family Medicine

## 2023-12-13 VITALS — BP 123/80 | HR 76 | Ht 66.0 in | Wt 316.7 lb

## 2023-12-13 DIAGNOSIS — Z6841 Body Mass Index (BMI) 40.0 and over, adult: Secondary | ICD-10-CM

## 2023-12-13 DIAGNOSIS — G4733 Obstructive sleep apnea (adult) (pediatric): Secondary | ICD-10-CM

## 2023-12-13 DIAGNOSIS — F339 Major depressive disorder, recurrent, unspecified: Secondary | ICD-10-CM | POA: Diagnosis not present

## 2023-12-13 DIAGNOSIS — G35 Multiple sclerosis: Secondary | ICD-10-CM | POA: Diagnosis not present

## 2023-12-13 DIAGNOSIS — G4719 Other hypersomnia: Secondary | ICD-10-CM

## 2023-12-13 DIAGNOSIS — G4700 Insomnia, unspecified: Secondary | ICD-10-CM

## 2023-12-13 DIAGNOSIS — I10 Essential (primary) hypertension: Secondary | ICD-10-CM

## 2023-12-13 DIAGNOSIS — F331 Major depressive disorder, recurrent, moderate: Secondary | ICD-10-CM

## 2023-12-13 DIAGNOSIS — E66813 Obesity, class 3: Secondary | ICD-10-CM

## 2023-12-13 MED ORDER — ARMODAFINIL 250 MG PO TABS: 250.0000 mg | ORAL_TABLET | Freq: Every day | ORAL | 3 refills | Status: DC

## 2023-12-13 MED ORDER — ARIPIPRAZOLE 10 MG PO TABS: 10.0000 mg | ORAL_TABLET | Freq: Every day | ORAL | 3 refills | Status: AC

## 2023-12-13 MED ORDER — DIAZEPAM 5 MG PO TABS: 5.0000 mg | ORAL_TABLET | Freq: Four times a day (QID) | ORAL | 0 refills | Status: DC | PRN

## 2023-12-13 MED ORDER — LOSARTAN POTASSIUM-HCTZ 100-25 MG PO TABS: 1.0000 | ORAL_TABLET | Freq: Every day | ORAL | 3 refills | Status: DC

## 2023-12-13 MED ORDER — TRAZODONE HCL 300 MG PO TABS: 300.0000 mg | ORAL_TABLET | Freq: Every day | ORAL | 1 refills | Status: AC

## 2023-12-13 NOTE — Progress Notes (Unsigned)
Established patient visit   Patient: Patricia Deleon   DOB: [birth year]   62 y.o. Female  MRN: [id number] Visit Date: 12/13/2023  Today's healthcare provider: Jacky Kindle, FNP  Introduced to nurse practitioner role and practice setting.  All questions answered.  Discussed provider/patient relationship and expectations.  Chief Complaint  Patient presents with   Medical Management of Chronic Issues    4 week follow-up. Patient was to increase in hyzaar to assist in meeting goal   Hypertension   Subjective    HPI HPI     Medical Management of Chronic Issues    Additional comments: 4 week follow-up. Patient was to increase in hyzaar to assist in meeting goal        Hypertension   Compliance with treatment is excellent.  Anxiety: Absent.  Blurred vision: Absent.  Chest pain: Absent.  Chest pressure/discomfort: Absent.  Dyspnea: Absent.  Headaches: Absent.  Lower extremity edema: Absent.  Orthopnea: Absent.  Palpitations: Present.  Paroxysmal nocturnal dyspnea: Absent.  Syncope: Absent.  The patient never exercises.  Patient's diet is generally unhealty.      Last edited by Acey Lav, CMA on 12/13/2023  9:54 AM.      Medications: Outpatient Medications Prior to Visit  Medication Sig   Armodafinil (NUVIGIL) 200 MG TABS One po qAM (Patient taking differently: 250 mg. One po qAM)   aspirin 81 MG chewable tablet Chew 81 mg by mouth daily.   Cholecalciferol (VITAMIN D3) 2000 UNITS capsule Take 4,000 Units by mouth daily.    Ferrous Sulfate Dried (HM SLOW RELEASE IRON) 45 MG TBCR Take by mouth.   gabapentin (NEURONTIN) 100 MG capsule Take 1 capsule (100 mg total) by mouth 2 (two) times daily.   Glatiramer Acetate 40 MG/ML SOSY Inject into the skin.   ketoconazole (NIZORAL) 2 % cream Apply 1 application topically daily.   ketoconazole (NIZORAL) 2 % shampoo APPLY 1 APPLICATION TOPICALLY 2 (TWO) TIMES A WEEK.   levothyroxine (SYNTHROID) 125 MCG tablet Take 1 tablet  (125 mcg total) by mouth daily.   magic mouthwash (nystatin, hydrocortisone, diphenhydrAMINE) suspension Swish and swallow 10 mLs 4 (four) times daily as needed for mouth pain.   metFORMIN (GLUCOPHAGE-XR) 750 MG 24 hr tablet Take 1 tablet (750 mg total) by mouth in the morning and at bedtime.   mirabegron ER (MYRBETRIQ) 50 MG TB24 tablet Take 1 tablet (50 mg total) by mouth daily.   nitrofurantoin, macrocrystal-monohydrate, (MACROBID) 100 MG capsule Take 1 capsule (100 mg total) by mouth every 12 (twelve) hours.   Omega-3 Fatty Acids (FISH OIL) 500 MG CAPS Take by mouth.   pantoprazole (PROTONIX) 40 MG tablet Take 1 tablet (40 mg total) by mouth daily.   phentermine 37.5 MG capsule Take 1 capsule (37.5 mg total) by mouth every morning.   polyethylene glycol powder (GLYCOLAX/MIRALAX) 17 GM/SCOOP powder Take 17 g by mouth at bedtime.    prochlorperazine (COMPAZINE) 10 MG tablet TAKE 1 TABLET (10 MG TOTAL) BY MOUTH IN THE MORNING, AT NOON, IN THE EVENING, AND AT BEDTIME.   rosuvastatin (CRESTOR) 20 MG tablet TAKE 1 TABLET BY MOUTH EVERY DAY   venlafaxine XR (EFFEXOR-XR) 150 MG 24 hr capsule TAKE 2 CAPSULES (300 MG TOTAL) BY MOUTH DAILY WITH BREAKFAST   Vitamin D, Ergocalciferol, (DRISDOL) 1.25 MG (50000 UNIT) CAPS capsule TAKE 1 CAPSULE (50,000 UNITS TOTAL) BY MOUTH EVERY 7 (SEVEN) DAYS   [DISCONTINUED] ARIPiprazole (ABILIFY) 10 MG tablet Take 1 tablet (10 mg  total) by mouth daily.   [DISCONTINUED] losartan-hydrochlorothiazide (HYZAAR) 50-12.5 MG tablet Take 2 tablets by mouth daily.   [DISCONTINUED] traZODone (DESYREL) 100 MG tablet TAKE 2 TABLETS BY MOUTH AT BEDTIME   ciprofloxacin-dexamethasone (CIPRODEX) OTIC suspension Place 4 drops into the left ear 2 (two) times daily. (Patient not taking: Reported on 12/13/2023)   vitamin C (ASCORBIC ACID) 500 MG tablet Take 500 mg by mouth daily. (Patient not taking: Reported on 12/13/2023)   No facility-administered medications prior to visit.      Objective    BP 123/80 (BP Location: Right Arm, Patient Position: Sitting, Cuff Size: Large)   Pulse 76   Ht 5\' 6"  (1.676 m)   Wt (!) 316 lb 11.2 oz (143.7 kg)   LMP 08/06/2018   BMI 51.12 kg/m   Physical Exam Vitals and nursing note reviewed.  Constitutional:      General: She is not in acute distress.    Appearance: Normal appearance. She is obese. She is not ill-appearing, toxic-appearing or diaphoretic.  HENT:     Head: Normocephalic and atraumatic.  Cardiovascular:     Rate and Rhythm: Normal rate and regular rhythm.     Pulses: Normal pulses.     Heart sounds: Normal heart sounds. No murmur heard.    No friction rub. No gallop.  Pulmonary:     Effort: Pulmonary effort is normal. No respiratory distress.     Breath sounds: Normal breath sounds. No stridor. No wheezing, rhonchi or rales.  Chest:     Chest wall: No tenderness.  Musculoskeletal:        General: No swelling, tenderness, deformity or signs of injury. Normal range of motion.     Right lower leg: No edema.     Left lower leg: No edema.  Skin:    General: Skin is warm and dry.     Capillary Refill: Capillary refill takes less than 2 seconds.     Coloration: Skin is not jaundiced or pale.     Findings: No bruising, erythema, lesion or rash.  Neurological:     General: No focal deficit present.     Mental Status: She is alert and oriented to person, place, and time. Mental status is at baseline.     Cranial Nerves: No cranial nerve deficit.     Sensory: No sensory deficit.     Motor: No weakness.     Coordination: Coordination normal.  Psychiatric:        Mood and Affect: Mood normal.        Behavior: Behavior normal.        Thought Content: Thought content normal.        Judgment: Judgment normal.     No results found for any visits on 12/13/23.  Assessment & Plan     Problem List Items Addressed This Visit       Cardiovascular and Mediastinum   Hypertension - Primary   Chronic,  improved Continue hyzaar 100-25 Goal remains 119/79      Relevant Medications   losartan-hydrochlorothiazide (HYZAAR) 100-25 MG tablet     Respiratory   Apnea, sleep   Chronic, stable Continue to emphasize weight mgmt, stress mgmt, co morbid dz control including HTN F/u as needed        Nervous and Auditory   Multiple sclerosis (HCC)   Chronic, variable symptoms Request for benzo for upcoming MRI Followed by neuro        Other   Adiposity   Chronic obesity,  morbid Body mass index is 51.12 kg/m. Discussed importance of healthy weight management Discussed diet and exercise       Relevant Medications   Armodafinil 250 MG tablet   Depression, recurrent (HCC)   Chronic, variable Denies SI or HI Pt has chronic health issues, estranged marriage, daughter with child and grown son with autism in 1 house    11/14/2023    3:19 PM 04/23/2023    9:40 AM 06/26/2022    9:52 AM  PHQ9 SCORE ONLY  PHQ-9 Total Score 8 7 12       11/14/2023    3:20 PM  GAD 7 : Generalized Anxiety Score  Nervous, Anxious, on Edge 0  Control/stop worrying 0  Worry too much - different things 0  Trouble relaxing 0  Restless 0  Easily annoyed or irritable 0  Afraid - awful might happen 0  Total GAD 7 Score 0  Anxiety Difficulty Not difficult at all    Request for med refills      Relevant Medications   diazepam (VALIUM) 5 MG tablet   ARIPiprazole (ABILIFY) 10 MG tablet   traZODone (DESYREL) 300 MG tablet   Excessive daytime sleepiness   Insomnia   Relevant Medications   traZODone (DESYREL) 300 MG tablet   Morbid obesity (HCC)   Relevant Medications   Armodafinil 250 MG tablet   Return in about 4 weeks (around 01/10/2024) for annual examination.     Leilani Merl, FNP, have reviewed all documentation for this visit. The documentation on 12/15/23 for the exam, diagnosis, procedures, and orders are all accurate and complete.  Jacky Kindle, FNP  [facility] [phone] (phone) [phone] (fax)  [facility]

## 2023-12-15 NOTE — Assessment & Plan Note (Signed)
Chronic, variable Denies SI or HI Pt has chronic health issues, estranged marriage, daughter with child and grown son with autism in 1 house    11/14/2023    3:19 PM 04/23/2023    9:40 AM 06/26/2022    9:52 AM  PHQ9 SCORE ONLY  PHQ-9 Total Score 8 7 12       11/14/2023    3:20 PM  GAD 7 : Generalized Anxiety Score  Nervous, Anxious, on Edge 0  Control/stop worrying 0  Worry too much - different things 0  Trouble relaxing 0  Restless 0  Easily annoyed or irritable 0  Afraid - awful might happen 0  Total GAD 7 Score 0  Anxiety Difficulty Not difficult at all    Request for med refills

## 2023-12-15 NOTE — Assessment & Plan Note (Signed)
Chronic obesity, morbid Body mass index is 51.12 kg/m. Discussed importance of healthy weight management Discussed diet and exercise

## 2023-12-15 NOTE — Assessment & Plan Note (Signed)
Chronic, variable symptoms Request for benzo for upcoming MRI Followed by neuro

## 2023-12-15 NOTE — Assessment & Plan Note (Signed)
Chronic, stable Continue to emphasize weight mgmt, stress mgmt, co morbid dz control including HTN F/u as needed

## 2023-12-15 NOTE — Assessment & Plan Note (Signed)
Chronic, improved Continue hyzaar 100-25 Goal remains 119/79

## 2023-12-17 ENCOUNTER — Ambulatory Visit: Payer: Commercial Managed Care - PPO | Admitting: Urology

## 2024-01-16 ENCOUNTER — Encounter: Payer: Commercial Managed Care - PPO | Admitting: Family Medicine

## 2024-01-22 ENCOUNTER — Encounter: Payer: Commercial Managed Care - PPO | Admitting: Family Medicine

## 2024-02-03 ENCOUNTER — Other Ambulatory Visit: Payer: Self-pay | Admitting: Urology

## 2024-02-04 ENCOUNTER — Encounter: Payer: Self-pay | Admitting: Urology

## 2024-02-12 ENCOUNTER — Encounter: Payer: Self-pay | Admitting: Family Medicine

## 2024-02-12 ENCOUNTER — Ambulatory Visit: Payer: Commercial Managed Care - PPO | Admitting: Family Medicine

## 2024-02-12 VITALS — BP 123/65 | HR 82 | Ht 66.0 in | Wt 320.8 lb

## 2024-02-12 DIAGNOSIS — E782 Mixed hyperlipidemia: Secondary | ICD-10-CM

## 2024-02-12 DIAGNOSIS — Z0000 Encounter for general adult medical examination without abnormal findings: Secondary | ICD-10-CM | POA: Insufficient documentation

## 2024-02-12 DIAGNOSIS — G35 Multiple sclerosis: Secondary | ICD-10-CM

## 2024-02-12 DIAGNOSIS — K219 Gastro-esophageal reflux disease without esophagitis: Secondary | ICD-10-CM

## 2024-02-12 DIAGNOSIS — N3941 Urge incontinence: Secondary | ICD-10-CM

## 2024-02-12 DIAGNOSIS — Z7984 Long term (current) use of oral hypoglycemic drugs: Secondary | ICD-10-CM

## 2024-02-12 DIAGNOSIS — Z23 Encounter for immunization: Secondary | ICD-10-CM | POA: Diagnosis not present

## 2024-02-12 DIAGNOSIS — R7303 Prediabetes: Secondary | ICD-10-CM

## 2024-02-12 DIAGNOSIS — Z0001 Encounter for general adult medical examination with abnormal findings: Secondary | ICD-10-CM | POA: Diagnosis not present

## 2024-02-12 DIAGNOSIS — E559 Vitamin D deficiency, unspecified: Secondary | ICD-10-CM

## 2024-02-12 DIAGNOSIS — I10 Essential (primary) hypertension: Secondary | ICD-10-CM | POA: Diagnosis not present

## 2024-02-12 DIAGNOSIS — Z79899 Other long term (current) drug therapy: Secondary | ICD-10-CM

## 2024-02-12 DIAGNOSIS — F331 Major depressive disorder, recurrent, moderate: Secondary | ICD-10-CM

## 2024-02-12 DIAGNOSIS — G4733 Obstructive sleep apnea (adult) (pediatric): Secondary | ICD-10-CM

## 2024-02-12 DIAGNOSIS — E039 Hypothyroidism, unspecified: Secondary | ICD-10-CM

## 2024-02-12 DIAGNOSIS — N182 Chronic kidney disease, stage 2 (mild): Secondary | ICD-10-CM

## 2024-02-12 MED ORDER — TIRZEPATIDE-WEIGHT MANAGEMENT 2.5 MG/0.5ML ~~LOC~~ SOLN: 2.5000 mg | SUBCUTANEOUS | 0 refills | Status: DC

## 2024-02-12 NOTE — Assessment & Plan Note (Signed)
On high-dose vitamin D supplementation for a year. Goal serum level 40-60 ng/mL. Current status to be re-evaluated. Discussed target range due to potential fatigue and pain even in low normal range. - Recheck serum vitamin D level

## 2024-02-12 NOTE — Assessment & Plan Note (Signed)
Early stages of CKD. Advised to avoid recurrent/frequent NSAIDs to prevent further kidney damage. Discussed potential causes including blood pressure, cholesterol, and blood sugar levels. - Avoid recurrent/frequent use of NSAIDs

## 2024-02-12 NOTE — Assessment & Plan Note (Signed)
On rosuvastatin. Previous LDL levels fluctuated; current cholesterol panel to be checked. Discussed importance of maintaining LDL levels to prevent cardiovascular risks. - Recheck cholesterol panel - Order rosuvastatin refill after receipt of results; adjust rosuvastatin dose if necessary

## 2024-02-12 NOTE — Assessment & Plan Note (Signed)
Counseled on diet and exercise. Start tirzepatide for weight loss and sleep apnea management

## 2024-02-12 NOTE — Assessment & Plan Note (Signed)
Stable. Continue losartan-hydrochlorothiazide 100-25 mg daily

## 2024-02-12 NOTE — Assessment & Plan Note (Signed)
Physical exam overall unremarkable except as noted above. Routine lab work ordered as noted. Discussed importance of vaccinations and general health screenings. Reviewed diet and exercise habits. Advised to improve diet and increase physical activity. Discussed risks and benefits of shingles vaccine, including potential reduced effectiveness due to glatiramer acetate but still recommended vaccination. - Administer Tdap vaccine - Recommend shingles vaccine at pharmacy due to being out of stock in clinic today. - Check B12 levels due to Protonix use.

## 2024-02-12 NOTE — Assessment & Plan Note (Signed)
On levothyroxine. Previous TSH levels slightly high; current levels to be rechecked. - Recheck TSH levels - Adjust levothyroxine dose if necessary

## 2024-02-12 NOTE — Assessment & Plan Note (Addendum)
On venlafaxine, reports it is effective. - Continue venlafaxine XR 300 mg daily, trazodone 300 mg nightly and aripiprazole 10 mg daily

## 2024-02-12 NOTE — Assessment & Plan Note (Signed)
Uses CPAP machine and takes armodafinil 250 mg daily for daytime sleepiness related to MS. CPAP machine may need replacement. Discussed need for new sleep study to qualify for replacement. - Order sleep study when patient sends name of preferred facility - Replace CPAP machine if necessary - Continue armodafinil - Start tirzepatide for weight loss to aid in improvement of sleep apnea signs and symptoms.

## 2024-02-12 NOTE — Assessment & Plan Note (Addendum)
Experiencing fatigue and leg weakness. Currently on glatiramer acetate injections three times a week. Starting local physical therapy to avoid long commutes. Importance of rotating injection sites discussed to prevent scar tissue formation. - Start local physical therapy - Continue glatiramer injections - Continue to follow with neurology.  Patient does have claustrophobia and requires Valium for her annual MRI

## 2024-02-12 NOTE — Assessment & Plan Note (Signed)
On metformin 750 mg twice daily. Discussed potential weight loss medications (Ozempic, [place]), insurance coverage, and MS specialist consultation. Explained insurance limitations and potential benefits/side effects (nausea, vomiting). - Continue metformin - Check insurance coverage for weight loss medications

## 2024-02-12 NOTE — Assessment & Plan Note (Signed)
Reports inability to control urine. Under urology care, on Myrbetriq, with pending tests. Advised to use phone reminders for test completion. - Complete pending urology tests

## 2024-02-12 NOTE — Progress Notes (Signed)
Complete physical exam   Patient: Patricia Deleon   DOB: [birth year]   63 y.o. Female  MRN: [id number] Visit Date: 02/12/2024  Today's healthcare provider: Sherlyn Hay, DO   Chief Complaint  Patient presents with   Annual Exam   Subjective    Patricia Deleon is a 63 y.o. female who presents today for a complete physical exam.  She reports consuming a general diet. The patient does not participate in regular exercise at present. She generally feels fairly well. She reports sleeping fairly well. She does not have additional problems to discuss today.  HPI  Patricia Deleon "Patricia Deleon" is a 63 year old female with multiple sclerosis and prediabetes who presents for a routine follow-up and medication review.  She has multiple sclerosis, leading to significant fatigue by the end of the day. She is on glatiramer acetate injections three times a week for her MS and armodafinil 250 mg daily to manage fatigue. 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  She has been on high-dose vitamin D since January of last year. She is due for a recheck of her levels and is not taking any over-the-counter vitamin D supplements.  She has prediabetes and takes metformin 750 mg twice daily. Her husband and son have also been on metformin, but her son experienced gastrointestinal side effects. She has not experienced any chest pain, shortness of breath, or acute vision changes. She reports dizziness upon standing but denies numbness or tingling.  She is on venlafaxine and aripiprazole for mood management, which she feels is effective. She also takes trazodone for sleep and uses a CPAP machine for sleep apnea, which her husband has adjusted to improve her sleep quality.  She has a history of weight management issues and has tried various medications, including phentermine, without success. She is considering other options for weight management.  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 She typically eats fast food at work and reports limited physical activity outside of work.   Past Medical History:  Diagnosis Date   Agoraphobia with panic disorder    Anemia    Depression    Erosive gastritis    Fundic gland polyps of stomach, benign    Geographic tongue    GERD (gastroesophageal reflux disease)    History of esophagogastroduodenoscopy (EGD)    Hypercholesterolemia    Hypertension    Intervertebral disc disorder    thoracic,thoracolumber,lumbosacral   Multiple sclerosis (HCC)    Obesity    Sleep apnea    Past Surgical History:  Procedure Laterality Date   COLONOSCOPY     COLONOSCOPY WITH PROPOFOL N/A 07/07/2018   Procedure: COLONOSCOPY WITH PROPOFOL;  Surgeon: Christena Deem, MD;  Location: [facility] ENDOSCOPY;  Service: Endoscopy;  Laterality: N/A;   ESOPHAGOGASTRODUODENOSCOPY (EGD) WITH PROPOFOL N/A 01/31/2016   Procedure: ESOPHAGOGASTRODUODENOSCOPY (EGD) WITH PROPOFOL;  Surgeon: Christena Deem, MD;  Location: [facility] ENDOSCOPY;  Service: Endoscopy;  Laterality: N/A;   IR FLUORO GUIDE CV LINE RIGHT  10/06/2018   IR REMOVAL TUN CV CATH W/O FL  10/15/2018   IR US GUIDE VASC ACCESS RIGHT  10/06/2018   NISSEN FUNDOPLICATION     Social History   Socioeconomic History   Marital status: Married    Spouse name: Not on file   Number of children: Not on file   Years of education: Not  on file   Highest education level: Not on file  Occupational History   Not on file  Tobacco Use   Smoking status: Never   Smokeless tobacco: Never  Vaping Use   Vaping status: Never Used  Substance and Sexual Activity   Alcohol use: No    Alcohol/week: 0.0 standard drinks of alcohol   Drug use: No   Sexual activity: Not on file    Comment: Married   Other Topics Concern   Not on file  Social History Narrative   Not on file   Social Drivers of Health   Financial Resource  Strain: Not on file  Food Insecurity: Not on file  Transportation Needs: Not on file  Physical Activity: Not on file  Stress: Not on file  Social Connections: Not on file  Intimate Partner Violence: Not on file   Family Status  Relation Name Status   Mother  Deceased   Father  Deceased   Brother  Alive   Son  Alive   MGF  Deceased   PGM  Deceased   MGM  (Not Specified)  No partnership data on file   Family History  Problem Relation Age of Onset   Lung cancer Mother    Hypertension Mother    Alcohol abuse Mother    Anxiety disorder Mother    Hypertension Father    Arthritis Father    Alcohol abuse Father    Esophageal cancer Father    Alcohol abuse Brother    Hypertension Brother    Autism Son    Coronary artery disease Maternal Grandfather    Heart attack Maternal Grandfather    CVA Paternal Grandmother    Skin cancer Paternal Grandmother    Cancer Maternal Grandmother    Allergies  Allergen Reactions   Shellfish Allergy Hives    Patient Care Team: Mikesha Migliaccio N, DO as PCP - General (Family Medicine)   Medications: Outpatient Medications Prior to Visit  Medication Sig Note   ARIPiprazole (ABILIFY) 10 MG tablet Take 1 tablet (10 mg total) by mouth daily.    Armodafinil 250 MG tablet Take 1 tablet (250 mg total) by mouth daily.    aspirin 81 MG chewable tablet Chew 81 mg by mouth daily.    Cholecalciferol (VITAMIN D3) 2000 UNITS capsule Take 4,000 Units by mouth daily.     Ferrous Sulfate Dried (HM SLOW RELEASE IRON) 45 MG TBCR Take by mouth.    gabapentin (NEURONTIN) 100 MG capsule Take 1 capsule (100 mg total) by mouth 2 (two) times daily.    Glatiramer Acetate 40 MG/ML SOSY Inject into the skin.    ketoconazole (NIZORAL) 2 % cream Apply 1 application topically daily.    ketoconazole (NIZORAL) 2 % shampoo APPLY 1 APPLICATION TOPICALLY 2 (TWO) TIMES A WEEK.    levothyroxine (SYNTHROID) 125 MCG tablet Take 1 tablet (125 mcg total) by mouth daily.     losartan-hydrochlorothiazide (HYZAAR) 100-25 MG tablet Take 1 tablet by mouth daily.    metFORMIN (GLUCOPHAGE-XR) 750 MG 24 hr tablet Take 1 tablet (750 mg total) by mouth in the morning and at bedtime.    mirabegron ER (MYRBETRIQ) 50 MG TB24 tablet Take 1 tablet (50 mg total) by mouth daily.    Omega-3 Fatty Acids (FISH OIL) 500 MG CAPS Take by mouth.    pantoprazole (PROTONIX) 40 MG tablet Take 1 tablet (40 mg total) by mouth daily.    polyethylene glycol powder (GLYCOLAX/MIRALAX) 17 GM/SCOOP powder Take 17 g by  mouth at bedtime.     rosuvastatin (CRESTOR) 20 MG tablet TAKE 1 TABLET BY MOUTH EVERY DAY    traZODone (DESYREL) 300 MG tablet Take 1 tablet (300 mg total) by mouth at bedtime.    venlafaxine XR (EFFEXOR-XR) 150 MG 24 hr capsule TAKE 2 CAPSULES (300 MG TOTAL) BY MOUTH DAILY WITH BREAKFAST    vitamin C (ASCORBIC ACID) 500 MG tablet Take 500 mg by mouth daily.    Vitamin D, Ergocalciferol, (DRISDOL) 1.25 MG (50000 UNIT) CAPS capsule TAKE 1 CAPSULE (50,000 UNITS TOTAL) BY MOUTH EVERY 7 (SEVEN) DAYS    [DISCONTINUED] ciprofloxacin-dexamethasone (CIPRODEX) OTIC suspension Place 4 drops into the left ear 2 (two) times daily.    [DISCONTINUED] magic mouthwash (nystatin, hydrocortisone, diphenhydrAMINE) suspension Swish and swallow 10 mLs 4 (four) times daily as needed for mouth pain.    [DISCONTINUED] nitrofurantoin, macrocrystal-monohydrate, (MACROBID) 100 MG capsule Take 1 capsule (100 mg total) by mouth every 12 (twelve) hours.    [DISCONTINUED] phentermine 37.5 MG capsule Take 1 capsule (37.5 mg total) by mouth every morning.    [DISCONTINUED] prochlorperazine (COMPAZINE) 10 MG tablet TAKE 1 TABLET (10 MG TOTAL) BY MOUTH IN THE MORNING, AT NOON, IN THE EVENING, AND AT BEDTIME.    [DISCONTINUED] Armodafinil (NUVIGIL) 200 MG TABS One po qAM (Patient taking differently: 250 mg. One po qAM)    [DISCONTINUED] diazepam (VALIUM) 5 MG tablet Take 1 tablet (5 mg total) by mouth every 6 (six) hours as  needed for anxiety. Take 1-2 tablets as needed the night before and morning of MRI (Patient not taking: Reported on 02/12/2024) 02/12/2024: was for MRI   No facility-administered medications prior to visit.    Review of Systems  Constitutional:  Positive for fatigue. Negative for chills and fever.  HENT:  Negative for congestion, ear pain, rhinorrhea, sneezing and sore throat.   Eyes: Negative.  Negative for pain and redness.  Respiratory:  Negative for cough, shortness of breath and wheezing.   Cardiovascular:  Negative for chest pain and leg swelling.  Gastrointestinal:  Negative for abdominal pain, blood in stool, constipation, diarrhea and nausea.  Endocrine: Negative for polydipsia and polyphagia.  Genitourinary: Negative.  Negative for dysuria, flank pain, hematuria, pelvic pain, vaginal bleeding and vaginal discharge.  Musculoskeletal:  Negative for arthralgias, back pain, gait problem and joint swelling.  Skin:  Negative for rash.  Neurological:  Positive for dizziness (frequent, at baseline). Negative for tremors, seizures, weakness, light-headedness, numbness and headaches.  Hematological:  Negative for adenopathy.  Psychiatric/Behavioral: Negative.  Negative for behavioral problems, confusion and dysphoric mood. The patient is not nervous/anxious and is not hyperactive.       Objective    BP 123/65   Pulse 82   Ht 5\' 6"  (1.676 m)   Wt (!) 320 lb 12.8 oz (145.5 kg)   LMP 08/06/2018   SpO2 (!) 18%   PF 97 L/min   BMI 51.78 kg/m    Physical Exam Vitals and nursing note reviewed.  Constitutional:      General: She is awake.     Appearance: Normal appearance. She is morbidly obese.  HENT:     Head: Normocephalic and atraumatic.     Right Ear: Tympanic membrane, ear canal and external ear normal.     Left Ear: Ear canal and external ear normal. A middle ear effusion (recovering from ear infection) is present.     Nose: Nose normal.     Mouth/Throat:     Mouth: Mucous  membranes are moist.     Pharynx: Oropharynx is clear. No oropharyngeal exudate or posterior oropharyngeal erythema.  Eyes:     General: No scleral icterus.    Extraocular Movements: Extraocular movements intact.     Conjunctiva/sclera: Conjunctivae normal.     Pupils: Pupils are equal, round, and reactive to light.  Neck:     Thyroid: No thyromegaly or thyroid tenderness.  Cardiovascular:     Rate and Rhythm: Normal rate and regular rhythm.     Pulses: Normal pulses.     Heart sounds: Normal heart sounds.  Pulmonary:     Effort: Pulmonary effort is normal. No tachypnea, bradypnea or respiratory distress.     Breath sounds: Normal breath sounds. No stridor. No wheezing, rhonchi or rales.  Abdominal:     General: Bowel sounds are normal. There is no distension.     Palpations: Abdomen is soft. There is no mass.     Tenderness: There is no abdominal tenderness. There is no guarding.     Hernia: No hernia is present.  Musculoskeletal:     Cervical back: Normal range of motion and neck supple.     Right lower leg: No edema.     Left lower leg: No edema.  Lymphadenopathy:     Cervical: No cervical adenopathy.  Skin:    General: Skin is warm and dry.  Neurological:     Mental Status: She is alert and oriented to person, place, and time. Mental status is at baseline.  Psychiatric:        Mood and Affect: Mood normal.        Behavior: Behavior normal.      Last depression screening scores    02/12/2024    8:18 AM 11/14/2023    3:19 PM 04/23/2023    9:40 AM  PHQ 2/9 Scores  PHQ - 2 Score 0 2 2  PHQ- 9 Score 2 8 7    Last fall risk screening    02/12/2024    8:19 AM  Fall Risk   Falls in the past year? 0  Number falls in past yr: 0  Injury with Fall? 0   Last Audit-C alcohol use screening    04/23/2023    9:41 AM  Alcohol Use Disorder Test (AUDIT)  1. How often do you have a drink containing alcohol? 0  3. How often do you have six or more drinks on one occasion? 0    A score of 3 or more in women, and 4 or more in men indicates increased risk for alcohol abuse, EXCEPT if all of the points are from question 1   No results found for any visits on 02/12/24.  Assessment & Plan    Routine Health Maintenance and Physical Exam  Exercise Activities and Dietary recommendations  Goals      Weight (lb) < 300 lb (136.1 kg)     Work to start losing weight, with initial goal weight of 300.        Immunization History  Administered Date(s) Administered   Influenza Split 09/21/2011   Influenza,inj,Quad PF,6+ Mos 10/12/2015, 10/25/2021   Influenza-Unspecified 10/16/2016   Tdap 02/21/2011, 02/12/2024    Health Maintenance  Topic Date Due   Zoster Vaccines- Shingrix (1 of 2) 02/17/2024 (Originally [birth year]/1982)   INFLUENZA VACCINE  03/30/2024 (Originally 08/01/2023)   COVID-19 Vaccine (1) 08/31/2024 (Originally 09/14/1966)   MAMMOGRAM  03/13/2025   Cervical Cancer Screening (HPV/Pap Cotest)  08/01/2025   Colonoscopy  07/07/2028  DTaP/Tdap/Td (3 - Td or Tdap) 02/11/2034   Hepatitis C Screening  Completed   HIV Screening  Completed   HPV VACCINES  Aged Out    Discussed health benefits of physical activity, and encouraged her to engage in regular exercise appropriate for her age and condition.   Annual physical exam Assessment & Plan: Physical exam overall unremarkable except as noted above. Routine lab work ordered as noted. Discussed importance of vaccinations and general health screenings. Reviewed diet and exercise habits. Advised to improve diet and increase physical activity. Discussed risks and benefits of shingles vaccine, including potential reduced effectiveness due to glatiramer acetate but still recommended vaccination. - Administer Tdap vaccine - Recommend shingles vaccine at pharmacy due to being out of stock in clinic today. - Check B12 levels due to Protonix use.   Primary hypertension Assessment & Plan: Stable. Continue  losartan-hydrochlorothiazide 100-25 mg daily  Orders: -     Comprehensive metabolic panel  Hypothyroidism, unspecified type Assessment & Plan: On levothyroxine. Previous TSH levels slightly high; current levels to be rechecked. - Recheck TSH levels - Adjust levothyroxine dose if necessary  Orders: -     TSH Rfx on Abnormal to Free T4  Multiple sclerosis (HCC) Assessment & Plan: Experiencing fatigue and leg weakness. Currently on glatiramer acetate injections three times a week. Starting local physical therapy to avoid long commutes. Importance of rotating injection sites discussed to prevent scar tissue formation. - Start local physical therapy - Continue glatiramer injections - Continue to follow with neurology.  Patient does have claustrophobia and requires Valium for her annual MRI   Moderate episode of recurrent major depressive disorder [facility] ) Assessment & Plan: On venlafaxine, reports it is effective. - Continue venlafaxine XR 300 mg daily, trazodone 300 mg nightly and aripiprazole 10 mg daily   Morbid obesity (HCC) Assessment & Plan: Counseled on diet and exercise. Start tirzepatide for weight loss and sleep apnea management  Orders: -     Tirzepatide-Weight Management; Inject 2.5 mg into the skin once a week.  Dispense: 2 mL; Refill: 0  Urge incontinence Assessment & Plan: Reports inability to control urine. Under urology care, on Myrbetriq, with pending tests. Advised to use phone reminders for test completion. - Complete pending urology tests    Chronic kidney disease, stage 2, mildly decreased GFR Assessment & Plan: Early stages of CKD. Advised to avoid recurrent/frequent NSAIDs to prevent further kidney damage. Discussed potential causes including blood pressure, cholesterol, and blood sugar levels. - Avoid recurrent/frequent use of NSAIDs   Orders: -     Microalbumin / creatinine urine ratio -     Comprehensive metabolic panel -     Tirzepatide-Weight  Management; Inject 2.5 mg into the skin once a week.  Dispense: 2 mL; Refill: 0  Vitamin D deficiency Assessment & Plan: On high-dose vitamin D supplementation for a year. Goal serum level 40-60 ng/mL. Current status to be re-evaluated. Discussed target range due to potential fatigue and pain even in low normal range. - Recheck serum vitamin D level  Orders: -     VITAMIN D 25 Hydroxy (Vit-D Deficiency, Fractures)  Prediabetes Assessment & Plan: On metformin 750 mg twice daily. Discussed potential weight loss medications (Ozempic, [place]), insurance coverage, and MS specialist consultation. Explained insurance limitations and potential benefits/side effects (nausea, vomiting). - Continue metformin - Check insurance coverage for weight loss medications  Orders: -     Hemoglobin A1c  Mixed hyperlipidemia Assessment & Plan: On rosuvastatin. Previous LDL levels fluctuated; current  cholesterol panel to be checked. Discussed importance of maintaining LDL levels to prevent cardiovascular risks. - Recheck cholesterol panel - Order rosuvastatin refill after receipt of results; adjust rosuvastatin dose if necessary  Orders: -     Lipid panel  Severe obstructive sleep apnea Assessment & Plan: Uses CPAP machine and takes armodafinil 250 mg daily for daytime sleepiness related to MS. CPAP machine may need replacement. Discussed need for new sleep study to qualify for replacement. - Order sleep study when patient sends name of preferred facility - Replace CPAP machine if necessary - Continue armodafinil - Start tirzepatide for weight loss to aid in improvement of sleep apnea signs and symptoms.  Orders: -     Tirzepatide-Weight Management; Inject 2.5 mg into the skin once a week.  Dispense: 2 mL; Refill: 0  Gastroesophageal reflux disease without esophagitis -     Vitamin B12  High risk medication use -     Vitamin B12  Need for shingles vaccine  Need for Tdap vaccination -     Tdap  vaccine greater than or equal to 7yo IM  Unable to administer shingles vaccination today due to being out of stock.    Return in about 6 months (around 08/11/2024) for Weight, HTN, chronic f/u.     I discussed the assessment and treatment plan with the patient  The patient was provided an opportunity to ask questions and all were answered. The patient agreed with the plan and demonstrated an understanding of the instructions.   The patient was advised to call back or seek an in-person evaluation if the symptoms worsen or if the condition fails to improve as anticipated.  Total time was 45 minutes. That includes chart review before the visit, the actual patient visit, and time spent on documentation after the visit.     Sherlyn Hay, DO  [facility] [phone] (phone) [phone] (fax)  [facility]

## 2024-02-13 LAB — TSH RFX ON ABNORMAL TO FREE T4: TSH: 1.94 u[IU]/mL (ref 0.450–4.500)

## 2024-02-13 LAB — COMPREHENSIVE METABOLIC PANEL
ALT: 13 [IU]/L (ref 0–32)
AST: 17 [IU]/L (ref 0–40)
Albumin: 4.2 g/dL (ref 3.8–4.9)
Alkaline Phosphatase: 87 [IU]/L (ref 44–121)
BUN/Creatinine Ratio: 15 (ref 12–28)
BUN: 15 mg/dL (ref 8–27)
Bilirubin Total: 0.4 mg/dL (ref 0.0–1.2)
CO2: 25 mmol/L (ref 20–29)
Calcium: 9.5 mg/dL (ref 8.7–10.3)
Chloride: 98 mmol/L (ref 96–106)
Creatinine, Ser: 1.02 mg/dL — ABNORMAL HIGH (ref 0.57–1.00)
Globulin, Total: 3 g/dL (ref 1.5–4.5)
Glucose: 110 mg/dL — ABNORMAL HIGH (ref 70–99)
Potassium: 4.6 mmol/L (ref 3.5–5.2)
Sodium: 141 mmol/L (ref 134–144)
Total Protein: 7.2 g/dL (ref 6.0–8.5)
eGFR: 63 mL/min/{1.73_m2} (ref >59)

## 2024-02-13 LAB — LIPID PANEL
Chol/HDL Ratio: 3.1 {ratio} (ref 0.0–4.4)
Cholesterol, Total: 121 mg/dL (ref 100–199)
HDL: 39 mg/dL — ABNORMAL LOW (ref >39)
LDL Chol Calc (NIH): 51 mg/dL (ref 0–99)
Triglycerides: 189 mg/dL — ABNORMAL HIGH (ref 0–149)
VLDL Cholesterol Cal: 31 mg/dL (ref 5–40)

## 2024-02-13 LAB — HEMOGLOBIN A1C
Est. average glucose Bld gHb Est-mCnc: 128 mg/dL
Hgb A1c MFr Bld: 6.1 % — ABNORMAL HIGH (ref 4.8–5.6)

## 2024-02-13 LAB — MICROALBUMIN / CREATININE URINE RATIO
Creatinine, Urine: 146 mg/dL
Microalb/Creat Ratio: 13 mg/g{creat} (ref 0–29)
Microalbumin, Urine: 19.1 ug/mL

## 2024-02-13 LAB — VITAMIN D 25 HYDROXY (VIT D DEFICIENCY, FRACTURES): Vit D, 25-Hydroxy: 57.6 ng/mL (ref 30.0–100.0)

## 2024-02-13 LAB — VITAMIN B12: Vitamin B-12: 459 pg/mL (ref 232–1245)

## 2024-02-17 ENCOUNTER — Telehealth: Payer: Self-pay | Admitting: Family Medicine

## 2024-02-17 NOTE — Telephone Encounter (Signed)
[facility] pharmacy is requesting refill rosuvastatin (CRESTOR) 20 MG tablet  Please advise

## 2024-02-18 NOTE — Telephone Encounter (Signed)
Per your last office note, "- Order rosuvastatin refill after receipt of results; adjust rosuvastatin dose if necessary"    Prescription pended. Please review and make any adjustments if necessary.

## 2024-02-24 ENCOUNTER — Telehealth: Payer: Self-pay | Admitting: Family Medicine

## 2024-02-24 DIAGNOSIS — E559 Vitamin D deficiency, unspecified: Secondary | ICD-10-CM

## 2024-02-24 MED ORDER — ROSUVASTATIN CALCIUM 20 MG PO TABS: 20.0000 mg | ORAL_TABLET | Freq: Every day | ORAL | 3 refills | Status: DC

## 2024-02-24 MED ORDER — VITAMIN D (ERGOCALCIFEROL) 1.25 MG (50000 UNIT) PO CAPS: 50000.0000 [IU] | ORAL_CAPSULE | ORAL | 0 refills | Status: DC

## 2024-02-24 NOTE — Telephone Encounter (Signed)
 Does she need to just do OTC now?

## 2024-02-24 NOTE — Telephone Encounter (Signed)
 [facility] is requesting refill Vitamin D, Ergocalciferol, (DRISDOL) 1.25 MG (50000 UNIT) CAPS capsule  Please advise

## 2024-02-25 ENCOUNTER — Encounter: Payer: Self-pay | Admitting: Family Medicine

## 2024-04-17 ENCOUNTER — Telehealth: Payer: Self-pay | Admitting: Family Medicine

## 2024-04-17 DIAGNOSIS — K219 Gastro-esophageal reflux disease without esophagitis: Secondary | ICD-10-CM

## 2024-04-17 NOTE — Telephone Encounter (Signed)
 [facility] faxed refill request for the following medications:   pantoprazole  (PROTONIX ) 40 MG tablet    levothyroxine  (SYNTHROID ) 125 MCG tablet    metFORMIN  (GLUCOPHAGE -XR) 750 MG 24 hr tablet   Please advise.

## 2024-04-20 MED ORDER — PANTOPRAZOLE SODIUM 40 MG PO TBEC: 40.0000 mg | DELAYED_RELEASE_TABLET | Freq: Every day | ORAL | 3 refills | Status: AC

## 2024-04-20 MED ORDER — METFORMIN HCL ER 750 MG PO TB24: 750.0000 mg | ORAL_TABLET | Freq: Two times a day (BID) | ORAL | 3 refills | Status: AC

## 2024-04-20 MED ORDER — LEVOTHYROXINE SODIUM 125 MCG PO TABS: 125.0000 ug | ORAL_TABLET | Freq: Every day | ORAL | 3 refills | Status: AC

## 2024-04-24 ENCOUNTER — Other Ambulatory Visit: Payer: Self-pay | Admitting: Family Medicine

## 2024-04-24 DIAGNOSIS — E559 Vitamin D deficiency, unspecified: Secondary | ICD-10-CM

## 2024-04-27 NOTE — Telephone Encounter (Signed)
 Requested medication (s) are due for refill today - no  Requested medication (s) are on the active medication list -yes  Future visit scheduled -yes  Last refill: 02/24/24 #12  Notes to clinic: high dose medication- requires provider review   Requested Prescriptions  Pending Prescriptions Disp Refills   Vitamin D , Ergocalciferol , (DRISDOL ) 1.25 MG (50000 UNIT) CAPS capsule [Pharmacy Med Name: VITAMIN D2 1.25MG (50,000 UNIT)] 12 capsule 0    Sig: Take 1 capsule (50,000 Units total) by mouth every 7 (seven) days. AFTER finishing this bottle, start OTC vitamin D  2000 units daily     Endocrinology:  Vitamins - Vitamin D  Supplementation 2 Failed - 04/27/2024  9:57 AM      Failed - Manual Review: Route requests for 50,000 IU strength to the provider      Passed - Ca in normal range and within 360 days    Calcium   Date Value Ref Range Status  02/12/2024 9.5 8.7 - 10.3 mg/dL Final   Calcium , Ion  Date Value Ref Range Status  10/11/2018 1.19 1.15 - 1.40 mmol/L Final         Passed - Vitamin D  in normal range and within 360 days    Vit D, 25-Hydroxy  Date Value Ref Range Status  02/12/2024 57.6 30.0 - 100.0 ng/mL Final    Comment:    Vitamin D  deficiency has been defined by the Institute of Medicine and an Endocrine Society practice guideline as a level of serum 25-OH vitamin D  less than 20 ng/mL (1,2). The Endocrine Society went on to further define vitamin D  insufficiency as a level between 21 and 29 ng/mL (2). 1. IOM (Institute of Medicine). 2010. Dietary reference    intakes for calcium  and D. [place]: The    [employer]. 2. Holick MF, Binkley Carlos, Bischoff-Ferrari HA, et al.    Evaluation, treatment, and prevention of vitamin D     deficiency: an Endocrine Society clinical practice    guideline. JCEM. 2011 Jul; 96(7):1911-30.          Passed - Valid encounter within last 12 months    Recent Outpatient Visits           2 months ago Annual physical exam    [facility] Pardue, Asencion Blacksmith, DO       Future Appointments             In 2 weeks Pardue, Asencion Blacksmith, DO [facility], [facility]               Requested Prescriptions  Pending Prescriptions Disp Refills   Vitamin D , Ergocalciferol , (DRISDOL ) 1.25 MG (50000 UNIT) CAPS capsule [Pharmacy Med Name: VITAMIN D2 1.25MG (50,000 UNIT)] 12 capsule 0    Sig: Take 1 capsule (50,000 Units total) by mouth every 7 (seven) days. AFTER finishing this bottle, start OTC vitamin D  2000 units daily     Endocrinology:  Vitamins - Vitamin D  Supplementation 2 Failed - 04/27/2024  9:57 AM      Failed - Manual Review: Route requests for 50,000 IU strength to the provider      Passed - Ca in normal range and within 360 days    Calcium   Date Value Ref Range Status  02/12/2024 9.5 8.7 - 10.3 mg/dL Final   Calcium , Ion  Date Value Ref Range Status  10/11/2018 1.19 1.15 - 1.40 mmol/L Final         Passed - Vitamin D  in normal range and  within 360 days    Vit D, 25-Hydroxy  Date Value Ref Range Status  02/12/2024 57.6 30.0 - 100.0 ng/mL Final    Comment:    Vitamin D  deficiency has been defined by the Institute of Medicine and an Endocrine Society practice guideline as a level of serum 25-OH vitamin D  less than 20 ng/mL (1,2). The Endocrine Society went on to further define vitamin D  insufficiency as a level between 21 and 29 ng/mL (2). 1. IOM (Institute of Medicine). 2010. Dietary reference    intakes for calcium  and D. [place]: The    [employer]. 2. Holick MF, Binkley Chilo, Bischoff-Ferrari HA, et al.    Evaluation, treatment, and prevention of vitamin D     deficiency: an Endocrine Society clinical practice    guideline. JCEM. 2011 Jul; 96(7):1911-30.          Passed - Valid encounter within last 12 months    Recent Outpatient Visits           2 months ago Annual physical exam   [facility] Pardue,  Asencion Blacksmith, DO       Future Appointments             In 2 weeks Pardue, Asencion Blacksmith, DO [place] [facility], [facility]

## 2024-05-14 ENCOUNTER — Telehealth: Payer: Self-pay

## 2024-05-14 ENCOUNTER — Encounter: Payer: Self-pay | Admitting: Family Medicine

## 2024-05-14 ENCOUNTER — Telehealth: Payer: Self-pay | Admitting: Family Medicine

## 2024-05-14 ENCOUNTER — Ambulatory Visit: Payer: Commercial Managed Care - PPO | Admitting: Family Medicine

## 2024-05-14 ENCOUNTER — Other Ambulatory Visit (HOSPITAL_COMMUNITY): Payer: Self-pay

## 2024-05-14 VITALS — BP 125/75 | HR 79 | Resp 16 | Ht 66.0 in | Wt 327.4 lb

## 2024-05-14 DIAGNOSIS — I10 Essential (primary) hypertension: Secondary | ICD-10-CM | POA: Diagnosis not present

## 2024-05-14 DIAGNOSIS — M7072 Other bursitis of hip, left hip: Secondary | ICD-10-CM

## 2024-05-14 DIAGNOSIS — G4733 Obstructive sleep apnea (adult) (pediatric): Secondary | ICD-10-CM

## 2024-05-14 DIAGNOSIS — G35 Multiple sclerosis: Secondary | ICD-10-CM

## 2024-05-14 DIAGNOSIS — Z23 Encounter for immunization: Secondary | ICD-10-CM

## 2024-05-14 DIAGNOSIS — F339 Major depressive disorder, recurrent, unspecified: Secondary | ICD-10-CM | POA: Diagnosis not present

## 2024-05-14 DIAGNOSIS — N393 Stress incontinence (female) (male): Secondary | ICD-10-CM

## 2024-05-14 DIAGNOSIS — M25562 Pain in left knee: Secondary | ICD-10-CM

## 2024-05-14 DIAGNOSIS — G4719 Other hypersomnia: Secondary | ICD-10-CM

## 2024-05-14 MED ORDER — ARMODAFINIL 250 MG PO TABS: 250.0000 mg | ORAL_TABLET | Freq: Every day | ORAL | 3 refills | Status: AC

## 2024-05-14 NOTE — Telephone Encounter (Signed)
 Pharmacy Patient Advocate Encounter   Received notification from Onbase that prior authorization for Armodafinil  250MG  tablets is required/requested.   Insurance verification completed.   The patient is insured through [facility] .   Per test claim: PA required; PA submitted to above mentioned insurance via CoverMyMeds Key/confirmation #/EOC [id number] Status is pending

## 2024-05-14 NOTE — Progress Notes (Signed)
 Established patient visit   Patient: Patricia Deleon   DOB: 08-01-1961   64 y.o. Female  MRN: [id number] Visit Date: 05/14/2024  Today's healthcare provider: Carlean Charter, DO   Chief Complaint  Patient presents with   Medical Management of Chronic Issues    Follow-up HTN and weight management   Subjective    HPI Patricia Deleon "Patricia Deleon" is a 63 year old female with multiple sclerosis and sleep apnea who presents with medication refill issues and knee pain.  She is experiencing difficulties obtaining her medications due to prior authorization requirements. She has been unable to refill her armodafinil , which she uses to help stay awake during the day, and another unspecified medication. She last received a three-month supply a few months ago. Her insurance has not changed, but she was informed that she needs new authorizations. She is frustrated with the process, noting that she has been on these medications for some time.  She reports knee pain that radiates down her leg, particularly when driving or twisting. The pain is described as sharp and achy, and it sometimes improves after the knee pops. She also experiences throbbing pain at night. She takes gabapentin , which she increased to alleviate leg pain. She has a history of bursitis in her hip.  She has multiple sclerosis and sleep apnea, which are part of her ongoing medical history. Her MS has been relatively stable, with her eye being the most affected initially. She also discusses stress incontinence and is scheduled for a cystoscopy on June 30th.  Her social history includes living with her daughter and grandson, which she finds stressful due to a lack of help with household tasks. She works at [employer] and, during one her recent personal appointments, shared an emotional moment with her nurse over a shared experience with pets.      Medications: Outpatient Medications Prior to Visit  Medication Sig    ARIPiprazole  (ABILIFY ) 10 MG tablet Take 1 tablet (10 mg total) by mouth daily.   aspirin 81 MG chewable tablet Chew 81 mg by mouth daily.   Cholecalciferol (VITAMIN D3) 2000 UNITS capsule Take 4,000 Units by mouth daily.    Ferrous Sulfate Dried (HM SLOW RELEASE IRON) 45 MG TBCR Take by mouth.   gabapentin  (NEURONTIN ) 100 MG capsule Take 1 capsule (100 mg total) by mouth 2 (two) times daily.   Glatiramer Acetate 40 MG/ML SOSY Inject into the skin.   ketoconazole  (NIZORAL ) 2 % cream Apply 1 application topically daily.   ketoconazole  (NIZORAL ) 2 % shampoo APPLY 1 APPLICATION TOPICALLY 2 (TWO) TIMES A WEEK.   levothyroxine  (SYNTHROID ) 125 MCG tablet Take 1 tablet (125 mcg total) by mouth daily.   losartan -hydrochlorothiazide (HYZAAR) 100-25 MG tablet Take 1 tablet by mouth daily.   metFORMIN  (GLUCOPHAGE -XR) 750 MG 24 hr tablet Take 1 tablet (750 mg total) by mouth in the morning and at bedtime.   mirabegron  ER (MYRBETRIQ ) 50 MG TB24 tablet Take 1 tablet (50 mg total) by mouth daily.   Omega-3 Fatty Acids (FISH OIL) 500 MG CAPS Take by mouth.   pantoprazole  (PROTONIX ) 40 MG tablet Take 1 tablet (40 mg total) by mouth daily.   polyethylene glycol powder (GLYCOLAX/MIRALAX) 17 GM/SCOOP powder Take 17 g by mouth at bedtime.    rosuvastatin  (CRESTOR ) 20 MG tablet Take 1 tablet (20 mg total) by mouth daily.   tirzepatide (ZEPBOUND) 2.5 MG/0.5ML injection vial Inject 2.5 mg into the skin once a week.  traZODone  (DESYREL ) 300 MG tablet Take 1 tablet (300 mg total) by mouth at bedtime.   venlafaxine  XR (EFFEXOR -XR) 150 MG 24 hr capsule TAKE 2 CAPSULES (300 MG TOTAL) BY MOUTH DAILY WITH BREAKFAST   vitamin C (ASCORBIC ACID) 500 MG tablet Take 500 mg by mouth daily.   Vitamin D , Ergocalciferol , (DRISDOL ) 1.25 MG (50000 UNIT) CAPS capsule Take 1 capsule (50,000 Units total) by mouth every 7 (seven) days. AFTER finishing this bottle, start OTC vitamin D  2000 units daily   [DISCONTINUED] Armodafinil  250 MG  tablet Take 1 tablet (250 mg total) by mouth daily.   No facility-administered medications prior to visit.        Objective    BP 125/75 (BP Location: Left Arm, Patient Position: Sitting, Cuff Size: Large)   Pulse 79   Resp 16   Ht 5\' 6"  (1.676 m)   Wt (!) 327 lb 6.4 oz (148.5 kg)   LMP 08/06/2018   SpO2 98%   BMI 52.84 kg/m     Physical Exam Vitals and nursing note reviewed.  Constitutional:      General: She is not in acute distress.    Appearance: Normal appearance.  HENT:     Head: Normocephalic and atraumatic.  Eyes:     General: No scleral icterus.    Conjunctiva/sclera: Conjunctivae normal.  Cardiovascular:     Rate and Rhythm: Normal rate.  Pulmonary:     Effort: Pulmonary effort is normal.  Musculoskeletal:       Legs:     Comments: Pain as noted, not tender with palpation  Neurological:     Mental Status: She is alert and oriented to person, place, and time. Mental status is at baseline.  Psychiatric:        Mood and Affect: Mood normal.        Behavior: Behavior normal.      No results found for any visits on 05/14/24.  Assessment & Plan    Primary hypertension  Morbid obesity (HCC)  Depression, recurrent (HCC)  Multiple sclerosis (HCC) -     Ambulatory referral to Physical Therapy  Acute pain of left knee -     Ambulatory referral to Physical Therapy  Bursitis of left hip, unspecified bursa  Stress incontinence  Need for shingles vaccine -     Varicella-zoster vaccine IM     Primary hypertension Chronic, stable.  Well-controlled today.  Continue losartan -hydrochlorothiazide 100-25 mg daily.  Morbid obesity Counseled patient on continued diet and exercise.  Will request prior authorization for tirzepatide for this and OSA.  Recurrent depression Continue Abilify , venlafaxine , and trazodone .  Follows with behavioral health.  Multiple Sclerosis Chronic condition with medication management challenges due to prior authorization  issues. Symptoms include knee pain, potentially related to MS or other conditions. - Resend prior authorization for medication. - Consider physical therapy for knee pain. - Discuss potential for orthopedic referral for knee injection if needed.  Acute left knee pain Pain possibly related to MS, bursitis, or meniscal damage, exacerbated by movement and driving. Occasional popping sensation provides relief. - Consider physical therapy for knee pain. - Discuss potential for orthopedic referral for knee injection if needed.  Hip Bursitis Chronic condition potentially contributing to knee pain. Pain radiates from hip to knee, possibly indicating sciatica or nerve involvement. - Consider physical therapy for hip bursitis. - Discuss potential for orthopedic referral for injection if needed.  Obstructive Sleep Apnea Chronic condition managed with medication. Difficulty obtaining armodafinil  due to prior authorization  issues. - Request prior authorization for armodafinil . - Advise monitoring for updates on authorization status.  Stress Incontinence Reports stress incontinence with cystoscopy scheduled. Plans to try physical therapy first. - Proceed with cystoscopy on June 30. - Consider physical therapy for stress incontinence. - Follows with urology, who discussed urethral bulking as a potential treatment option. - Defer to specialist management    Return in about 3 months (around 08/14/2024) for Weight.      I discussed the assessment and treatment plan with the patient  The patient was provided an opportunity to ask questions and all were answered. The patient agreed with the plan and demonstrated an understanding of the instructions.   The patient was advised to call back or seek an in-person evaluation if the symptoms worsen or if the condition fails to improve as anticipated.    Carlean Charter, DO  [facility] [phone] (phone) [phone]  (fax)  [facility]

## 2024-05-15 ENCOUNTER — Other Ambulatory Visit (HOSPITAL_COMMUNITY): Payer: Self-pay

## 2024-05-15 ENCOUNTER — Telehealth: Payer: Self-pay

## 2024-05-15 NOTE — Telephone Encounter (Signed)
 Pharmacy Patient Advocate Encounter   Received notification from Pt Calls Messages that prior authorization for Zepbound 2.5MG /0.5ML pen-injectors is required/requested.   Insurance verification completed.   The patient is insured through [facility] .   Per test claim: PA required; PA submitted to above mentioned insurance via CoverMyMeds Key/confirmation #/EOC [id number] Status is pending

## 2024-05-15 NOTE — Telephone Encounter (Signed)
 Pharmacy Patient Advocate Encounter  Received notification from [employer] that Prior Authorization for Armodafinil  250MG  tablets has been APPROVED from 05/14/24 to 11/14/24. Unable to obtain price due to refill too soon rejection, last fill date 05/14/24 next available fill date08/04/25   PA #/Case ID/Reference #: [id number]

## 2024-05-20 NOTE — Telephone Encounter (Signed)
 Pharmacy Patient Advocate Encounter  Received notification from [employer] that Prior Authorization for Zepbound 2.5MG /0.5ML pen-injectors has been DENIED.  See denial reason below. No denial letter attached in CMM. Will attach denial letter to Media tab once received.   PA #/Case ID/Reference #: [id number]

## 2024-05-22 NOTE — Telephone Encounter (Signed)
 Note opened for PA only

## 2024-05-24 ENCOUNTER — Encounter: Payer: Self-pay | Admitting: Family Medicine

## 2024-06-16 ENCOUNTER — Encounter: Payer: Self-pay | Admitting: Family Medicine

## 2024-06-29 ENCOUNTER — Other Ambulatory Visit: Admitting: Urology

## 2024-07-06 NOTE — Therapy (Signed)
 OUTPATIENT PHYSICAL THERAPY KNEE EVALUATION  Patient Name: Patricia Deleon MRN: [id number] DOB:1961/01/28, 63 y.o., female Today's Date: 07/07/2024  END OF SESSION:  PT End of Session - 07/07/24 1303     Visit Number 1    Number of Visits 17    Date for PT Re-Evaluation 09/01/24    PT Start Time 1302    PT Stop Time 1345    PT Time Calculation (min) 43 min    Activity Tolerance Patient tolerated treatment well    Behavior During Therapy WFL for tasks assessed/performed          Past Medical History:  Diagnosis Date   Agoraphobia with panic disorder    Anemia    Depression    Erosive gastritis    Fundic gland polyps of stomach, benign    Geographic tongue    GERD (gastroesophageal reflux disease)    History of esophagogastroduodenoscopy (EGD)    Hypercholesterolemia    Hypertension    Intervertebral disc disorder    thoracic,thoracolumber,lumbosacral   Multiple sclerosis (HCC)    Obesity    Sleep apnea    Past Surgical History:  Procedure Laterality Date   COLONOSCOPY     COLONOSCOPY WITH PROPOFOL  N/A 07/07/2018   Procedure: COLONOSCOPY WITH PROPOFOL ;  Surgeon: Patricia Patricia PENNER, MD;  Location: [facility] ENDOSCOPY;  Service: Endoscopy;  Laterality: N/A;   ESOPHAGOGASTRODUODENOSCOPY (EGD) WITH PROPOFOL  N/A 01/31/2016   Procedure: ESOPHAGOGASTRODUODENOSCOPY (EGD) WITH PROPOFOL ;  Surgeon: Patricia Deleon Gaylyn, MD;  Location: [facility] ENDOSCOPY;  Service: Endoscopy;  Laterality: N/A;   IR FLUORO GUIDE CV LINE RIGHT  10/06/2018   IR REMOVAL TUN CV CATH W/O FL  10/15/2018   IR US  GUIDE VASC ACCESS RIGHT  10/06/2018   NISSEN FUNDOPLICATION     Patient Active Problem List   Diagnosis Date Noted   Chronic kidney disease, stage 2, mildly decreased GFR 02/12/2024   Annual physical exam 02/12/2024   Prediabetes 01/15/2023   Morbid obesity (HCC) 01/15/2023   Mixed hyperlipidemia 01/15/2023   Depression, recurrent (HCC) 01/15/2023   Urinary tract infection, recurrent 07/11/2022   UTI  symptoms 06/14/2022   Moderate episode of recurrent major depressive disorder (HCC) 05/28/2019   Excessive daytime sleepiness 08/25/2018   Multiple sclerosis (HCC) 07/08/2018   Neck pain 07/08/2018   Transient vision disturbance of left eye 06/18/2018   Urge incontinence 06/18/2018   Numbness 06/18/2018   Acne rosacea 05/07/2016   Gastroesophageal reflux disease without esophagitis 09/28/2015   Displacement of lumbar intervertebral disc without myelopathy 09/28/2015   Insomnia 09/28/2015   Thoracic, 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   Adiposity 09/28/2015   Restless leg 09/28/2015   Severe obstructive sleep apnea 09/28/2015   Vitamin D  deficiency 09/28/2015   Hypothyroidism 07/11/2015   Hypertension 06/09/2015    PCP: Patricia Lauraine SAILOR, DO  REFERRING PROVIDER: Donzella Lauraine SAILOR, DO  REFERRING DIAG:  [phone] (ICD-10-CM) - Acute pain of left knee  G35 (ICD-10-CM) - Multiple sclerosis (HCC)    RATIONALE FOR EVALUATION AND TREATMENT: Rehabilitation  THERAPY DIAG: Acute pain of left knee  Muscle weakness (generalized)  ONSET DATE: Chronic   FOLLOW-UP APPT SCHEDULED WITH REFERRING PROVIDER: Yes August 21, 2024    SUBJECTIVE:  SUBJECTIVE STATEMENT:    Patient reporting to OPPT with a c/c of L knee pain.   PERTINENT HISTORY:   [name] is a 63 y.o. patient arriving to OPPT with L knee pain. She denies MOI or trauma to the knee. She reports that her knee will lock then pop with a relieving sensation. Additionally she reports that restricted ROM in her L knee compared to R knee. Patient reports aggravating factors: stairway navigation (both directions), sleeping at night. At night she describes as the pain worsening as it radiates from the L  knee distal into the ankle/foot and intermittently it will travel to the L hip. Relieving factors include: daily supplements, gabapentin , ice modalities.   Red flags: chills/fever, night sweats, unexplained weight gain/loss, unrelenting pain  PAIN:   Pain Intensity: Present: 0/10, Best: 0/10, Worst: 8/10 Pain location: L knee Pain quality: intermittent, aching, and throbbing  Radiating pain: Yes  Swelling: No  Popping, catching, locking: Yes  Numbness/Tingling: No Focal weakness or buckling: Yes 24-hour pain behavior: PM - Activity Dependent  History of prior back, hip, or knee injury, pain, surgery, or therapy: Yes  PRECAUTIONS: Fall, Low   WEIGHT BEARING RESTRICTIONS: No  FALLS: Has patient fallen in last 6 months? No  Living Environment Lives with: lives with their family Husband, Daughter, Son, Patricia Deleon (3 yr old), 3 Dogs Lives in: House/apartment Stairs: Yes: Internal: 12-14 steps; on right going up Has following equipment at home: None  Prior level of function: Independent  Occupational demands: Works at [employer] (M-F, some Sat, 8-6)  Hobbies: None   Patient Goals: Patient would like to be able to perform stairway navigation without pain   OBJECTIVE:   Patient Surveys  LEFS: 28/80 = 35%    Cognition Cognition is within normal limits.     Gross Musculoskeletal Assessment Tremor: None Bulk: Normal Tone: Normal  GAIT: Distance walked: 1m Assistive device utilized: None Level of assistance: Complete Independence Comments: Reciprocal Gait, decreased velocity   Posture: Sitting: WNL Standing: WNL    AROM AROM (Normal range in degrees) AROM  Hip Right Left  Flexion (125) 90   Extension (15)    Abduction (40)    Adduction     Internal Rotation (45)    External Rotation (45)        Knee    Flexion (135) 110 110 *   Extension (0) 0 0      Ankle    Dorsiflexion (20)    Plantarflexion (50)    Inversion (35)    Eversion (15     (* = pain; Blank rows = not tested)  LE MMT: MMT (out of 5) Right Left  Hip flexion 4 4  Hip extension    Hip abduction (Seated) 4+ 4+  Hip adduction 5 5  Hip internal rotation    Hip external rotation    Knee flexion 4 4  Knee extension 4 4*  Ankle dorsiflexion 5 5  Ankle plantarflexion 5 5  Ankle inversion    Ankle eversion    (* = pain; Blank rows = not tested)  Sensation Grossly intact to light touch bilateral LEs as determined by testing dermatomes L2-S2. Proprioception, and hot/cold testing deferred on this date.  Reflexes R/L Knee Jerk (L3/4): 2+/2+  Ankle Jerk (S1/2): 2+/2+   Muscle Length Hamstrings: R: Not examined L: Negative Quadriceps Arvid): R: Not examined L: Not examined   Palpation Location LEFT  RIGHT  Quadriceps 0   Medial Hamstrings 0   Lateral Hamstrings 0   Lateral Hamstring tendon 0   Medial Hamstring tendon 0   Quadriceps tendon 0   Patella    Patellar Tendon    Tibial Tuberosity    Medial joint line  2*    Lateral joint line 1   MCL 1   LCL    Adductor Tubercle    Pes Anserine tendon 1   Infrapatellar fat pad    Fibular head    Popliteal fossa    (Blank rows = not tested) Graded on 0-4 scale (0 = no pain, 1 = pain, 2 = pain with wincing/grimacing/flinching, 3 = pain with withdrawal, 4 = unwilling to allow palpation), (Blank rows = not tested)  Passive Accessory Motion Deferred  VASCULAR Dorsalis pedis and posterior tibial pulses are palpable   SPECIAL TESTS  Ligamentous Stability  MCL: Valgus Stress (30 degrees flexion): R: Not examined L: Negative  LCL: Varus Stress (30 degrees flexion): R: Not examined L: Negative  Meniscus Tests McMurray's Test:  Medial Meniscus (Tibial ER): R: Not examined L: Positive for Pain with ER  Lateral Meniscus (Tibial IR): R: Not examined L: Not examined   Patellar Tendinopathy Inferior pole palpation with anterior tilt: R: Not examined L: Negative  Functional  Testing:  Squat: Deferred Sit to Stand: WNL, Reliant on UE in order to control descent Lateral Step Down: Deferred   : 13.99s 5TSTS: 16.15s   TODAY'S TREATMENT: DATE: 07/07/2024  Evaluation Only   PATIENT EDUCATION:  Education details: HEP, POC  Person educated: Patient Education method: Explanation, Demonstration, and Handouts Education comprehension: verbalized understanding and returned demonstration   HOME EXERCISE PROGRAM:   Access Code: [email or web address] URL: [email or web address] Date: 07/07/2024 Prepared by: Lonni Pall  Exercises - Supine Heel Slide  - 1 x daily - 3-4 x weekly - 2-3 sets - 8-10 reps - Supine Active Straight Leg Raise  - 1 x daily - 3-4 x weekly - 2-3 sets - 8-10 reps - 5 hold - Mini Squat  - 1 x daily - 3-4 x weekly - 2-3 sets - 8-10 reps - Long Sitting Quad Set with Towel Roll Under Heel  - 1 x daily - 3-4 x weekly - 2-3 sets - 8-10 reps  ASSESSMENT:  CLINICAL IMPRESSION: Aireona Torelli is a 63 y.o. female who was seen today for physical therapy evaluation and treatment for Left Knee Pain. Pain characterized by intermittent locking and restricted ROM with relief upon a popping sensation; no clear MOI or trauma to the L knee within the last 6 mos. Objective findings significant for symmetrical limited bilateral knee flexion (110); pain reproduced with end range flexion in the L knee. Palpation significant for L medial joint line tenderness, MCL and proximal anterior tibia. Special testing with positive McMurray test ER suggesting potential meniscal involvement. Functional testing (see below) indicative of decreased LE strength and endurance. Stair navigation with decreased endurance and pain upon ascending/descending with the LLE. LEFS indicative of severe functional limitations in daily and recreational activities. Signs and symptoms consistent with mechanical L knee pain with possible suggestions of meniscal  involvement/degeneration. Based on these findings, patient will benefit from skilled physical therapy in order to address current deficits and maximize return to PLOF.    OBJECTIVE IMPAIRMENTS: decreased activity tolerance, decreased ROM, decreased strength, obesity, and pain.   ACTIVITY LIMITATIONS: lifting, bending, sitting, standing, squatting, and stairs  PARTICIPATION LIMITATIONS: driving and occupation  PERSONAL FACTORS: Age, Past/current  experiences, Time since onset of injury/illness/exacerbation, and 3+ comorbidities: Multiple Sclerosis, Chronic Kidney Disease, Obesity, etc. are also affecting patient's functional outcome.   REHAB POTENTIAL: Good  CLINICAL DECISION MAKING: Evolving/moderate complexity  EVALUATION COMPLEXITY: Moderate   GOALS: Goals reviewed with patient? Yes  SHORT TERM GOALS: Target date: 08/04/2024  Pt will be independent with HEP to improve strength and decrease knee pain to improve pain-free function at home and work. Baseline: 07/07/2024: Initial HEP  Goal status: INITIAL   LONG TERM GOALS: Target date: 09/01/2024  Patient will be able to safely navigate a flight of 12 stairs, using proper foot placement and handrail support, without requiring assistance from PT in order to demonstrate significant improvement in LE strength and safety. Baseline: 07/07/2024: Pain with ascending/descending with LLE.  Goal status: INITIAL  2.  Pt will decrease worst knee pain by at least 3 points on the NPRS in order to demonstrate clinically significant reduction in knee pain. Baseline: 07/07/2024: 8/10 NPS Goal status: INITIAL  3.  Pt will increase LEFS score by at least 9 points in order demonstrate clinically significant reduction in knee pain/disability.       Baseline: 07/07/2024: 28/80 = 35%  Goal status: INITIAL  4.  Pt will increase strength of knee flexion/extension by at least 1/2 MMT grade and without pain in order to demonstrate improvement in strength and  function  Baseline: 07/07/2024: L Knee Flex/Ext: 4/5, 4/5* Goal status: INITIAL  5.  Pt will decrease 5TSTS by at least 3 seconds in order to demonstrate clinically significant improvement in LE strength Baseline: 07/07/2024: 16.15s  Goal status: INITIAL  6.  Pt will increase by at least 0.13 m/s in order to demonstrate clinically significant improvement in community ambulation.  Baseline: 07/07/2024: .71 m/s (Limited Tourist information centre manager)  Goal status: INITIAL   PLAN: PT FREQUENCY: 1-2x/week  PT DURATION: 8 weeks  PLANNED INTERVENTIONS: Therapeutic exercises, Therapeutic activity, Neuromuscular re-education, Balance training, Gait training, Patient/Family education, Self Care, Joint mobilization, Joint manipulation, Vestibular training, Canalith repositioning, Orthotic/Fit training, DME instructions, Dry Needling, Electrical stimulation, Spinal manipulation, Spinal mobilization, Cryotherapy, Moist heat, Taping, Traction, Manual therapy, and Re-evaluation.  PLAN FOR NEXT SESSION: Review HEP; Joint mobilization PRN, Quadricep/Hip Strengthening, Progressing functional tasks (sit to stand and stairs).    Lonni Pall PT, DPT Physical Therapist- [place]  07/07/2024, 10:25 PM

## 2024-07-07 ENCOUNTER — Ambulatory Visit: Attending: Family Medicine

## 2024-07-07 DIAGNOSIS — M6281 Muscle weakness (generalized): Secondary | ICD-10-CM | POA: Diagnosis present

## 2024-07-07 DIAGNOSIS — M25562 Pain in left knee: Secondary | ICD-10-CM | POA: Diagnosis present

## 2024-07-07 DIAGNOSIS — M25662 Stiffness of left knee, not elsewhere classified: Secondary | ICD-10-CM | POA: Diagnosis present

## 2024-07-14 ENCOUNTER — Ambulatory Visit

## 2024-07-14 DIAGNOSIS — M25662 Stiffness of left knee, not elsewhere classified: Secondary | ICD-10-CM

## 2024-07-14 DIAGNOSIS — M25562 Pain in left knee: Secondary | ICD-10-CM

## 2024-07-14 DIAGNOSIS — M6281 Muscle weakness (generalized): Secondary | ICD-10-CM

## 2024-07-14 NOTE — Therapy (Signed)
 OUTPATIENT PHYSICAL THERAPY KNEE TREATMENT  Patient Name: Patricia Deleon MRN: [id number] DOB:1961/11/16, 63 y.o., female Today's Date: 07/14/2024  END OF SESSION:  PT End of Session - 07/14/24 1431     Visit Number 2    Number of Visits 17    Date for PT Re-Evaluation 09/01/24    PT Start Time 1431    PT Stop Time 1510    PT Time Calculation (min) 39 min    Activity Tolerance Patient tolerated treatment well    Behavior During Therapy WFL for tasks assessed/performed           Past Medical History:  Diagnosis Date   Agoraphobia with panic disorder    Anemia    Depression    Erosive gastritis    Fundic gland polyps of stomach, benign    Geographic tongue    GERD (gastroesophageal reflux disease)    History of esophagogastroduodenoscopy (EGD)    Hypercholesterolemia    Hypertension    Intervertebral disc disorder    thoracic,thoracolumber,lumbosacral   Multiple sclerosis (HCC)    Obesity    Sleep apnea    Past Surgical History:  Procedure Laterality Date   COLONOSCOPY     COLONOSCOPY WITH PROPOFOL  N/A 07/07/2018   Procedure: COLONOSCOPY WITH PROPOFOL ;  Surgeon: Gaylyn Gladis PENNER, MD;  Location: [facility] ENDOSCOPY;  Service: Endoscopy;  Laterality: N/A;   ESOPHAGOGASTRODUODENOSCOPY (EGD) WITH PROPOFOL  N/A 01/31/2016   Procedure: ESOPHAGOGASTRODUODENOSCOPY (EGD) WITH PROPOFOL ;  Surgeon: Gladis PENNER Gaylyn, MD;  Location: [facility] ENDOSCOPY;  Service: Endoscopy;  Laterality: N/A;   IR FLUORO GUIDE CV LINE RIGHT  10/06/2018   IR REMOVAL TUN CV CATH W/O FL  10/15/2018   IR US  GUIDE VASC ACCESS RIGHT  10/06/2018   NISSEN FUNDOPLICATION     Patient Active Problem List   Diagnosis Date Noted   Chronic kidney disease, stage 2, mildly decreased GFR 02/12/2024   Annual physical exam 02/12/2024   Prediabetes 01/15/2023   Morbid obesity (HCC) 01/15/2023   Mixed hyperlipidemia 01/15/2023   Depression, recurrent (HCC) 01/15/2023   Urinary tract infection, recurrent 07/11/2022    UTI symptoms 06/14/2022   Moderate episode of recurrent major depressive disorder (HCC) 05/28/2019   Excessive daytime sleepiness 08/25/2018   Multiple sclerosis (HCC) 07/08/2018   Neck pain 07/08/2018   Transient vision disturbance of left eye 06/18/2018   Urge incontinence 06/18/2018   Numbness 06/18/2018   Acne rosacea 05/07/2016   Gastroesophageal reflux disease without esophagitis 09/28/2015   Displacement of lumbar intervertebral disc without myelopathy 09/28/2015   Insomnia 09/28/2015   Thoracic, 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   Adiposity 09/28/2015   Restless leg 09/28/2015   Severe obstructive sleep apnea 09/28/2015   Vitamin D  deficiency 09/28/2015   Hypothyroidism 07/11/2015   Hypertension 06/09/2015    PCP: Donzella Lauraine SAILOR, DO  REFERRING PROVIDER: Donzella Lauraine SAILOR, DO  REFERRING DIAG:  [phone] (ICD-10-CM) - Acute pain of left knee  G35 (ICD-10-CM) - Multiple sclerosis (HCC)    RATIONALE FOR EVALUATION AND TREATMENT: Rehabilitation  THERAPY DIAG: Acute pain of left knee  Muscle weakness (generalized)  Stiffness of left knee, not elsewhere classified  ONSET DATE: Chronic   FOLLOW-UP APPT SCHEDULED WITH REFERRING PROVIDER: Yes August 21, 2024    SUBJECTIVE:  SUBJECTIVE STATEMENT:    Patient reporting to OPPT with a c/c of L knee pain.   PERTINENT HISTORY:   Patricia Deleon is a 63 y.o. patient arriving to OPPT with L knee pain. She denies MOI or trauma to the knee. She reports that her knee will lock then pop with a relieving sensation. Additionally she reports that restricted ROM in her L knee compared to R knee. Patient reports aggravating factors: stairway navigation (both directions), sleeping at night. At night she  describes as the pain worsening as it radiates from the L knee distal into the ankle/foot and intermittently it will travel to the L hip. Relieving factors include: daily supplements, gabapentin , ice modalities.   Red flags: chills/fever, night sweats, unexplained weight gain/loss, unrelenting pain  PAIN:   Pain Intensity: Present: 0/10, Best: 0/10, Worst: 8/10 Pain location: L knee Pain quality: intermittent, aching, and throbbing  Radiating pain: Yes  Swelling: No  Popping, catching, locking: Yes  Numbness/Tingling: No Focal weakness or buckling: Yes 24-hour pain behavior: PM - Activity Dependent  History of prior back, hip, or knee injury, pain, surgery, or therapy: Yes  PRECAUTIONS: Fall, Low   WEIGHT BEARING RESTRICTIONS: No  FALLS: Has patient fallen in last 6 months? No  Living Environment Lives with: lives with their family Husband, Daughter, Son, Darden (3 yr old), 3 Dogs Lives in: House/apartment Stairs: Yes: Internal: 12-14 steps; on right going up Has following equipment at home: None  Prior level of function: Independent  Occupational demands: Works at [employer] (M-F, some Sat, 8-6)  Hobbies: None   Patient Goals: Patient would like to be able to perform stairway navigation without pain   OBJECTIVE:   Patient Surveys  LEFS: 28/80 = 35%    Cognition Cognition is within normal limits.     Gross Musculoskeletal Assessment Tremor: None Bulk: Normal Tone: Normal  GAIT: Distance walked: 17m Assistive device utilized: None Level of assistance: Complete Independence Comments: Reciprocal Gait, decreased velocity   Posture: Sitting: WNL Standing: WNL    AROM AROM (Normal range in degrees) AROM  Hip Right Left  Flexion (125) 90   Extension (15)    Abduction (40)    Adduction     Internal Rotation (45)    External Rotation (45)        Knee    Flexion (135) 110 110 *   Extension (0) 0 0      Ankle    Dorsiflexion (20)     Plantarflexion (50)    Inversion (35)    Eversion (15    (* = pain; Blank rows = not tested)  LE MMT: MMT (out of 5) Right Left  Hip flexion 4 4  Hip extension    Hip abduction (Seated) 4+ 4+  Hip adduction 5 5  Hip internal rotation    Hip external rotation    Knee flexion 4 4  Knee extension 4 4*  Ankle dorsiflexion 5 5  Ankle plantarflexion 5 5  Ankle inversion    Ankle eversion    (* = pain; Blank rows = not tested)  Sensation Grossly intact to light touch bilateral LEs as determined by testing dermatomes L2-S2. Proprioception, and hot/cold testing deferred on this date.  Reflexes R/L Knee Jerk (L3/4): 2+/2+  Ankle Jerk (S1/2): 2+/2+   Muscle Length Hamstrings: R: Not examined L: Negative Quadriceps Arvid): R: Not examined L: Not examined   Palpation Location LEFT  RIGHT  Quadriceps 0   Medial Hamstrings 0   Lateral Hamstrings 0   Lateral Hamstring tendon 0   Medial Hamstring tendon 0   Quadriceps tendon 0   Patella    Patellar Tendon    Tibial Tuberosity    Medial joint line  2*    Lateral joint line 1   MCL 1   LCL    Adductor Tubercle    Pes Anserine tendon 1   Infrapatellar fat pad    Fibular head    Popliteal fossa    (Blank rows = not tested) Graded on 0-4 scale (0 = no pain, 1 = pain, 2 = pain with wincing/grimacing/flinching, 3 = pain with withdrawal, 4 = unwilling to allow palpation), (Blank rows = not tested)  Passive Accessory Motion Deferred  VASCULAR Dorsalis pedis and posterior tibial pulses are palpable   SPECIAL TESTS  Ligamentous Stability  MCL: Valgus Stress (30 degrees flexion): R: Not examined L: Negative  LCL: Varus Stress (30 degrees flexion): R: Not examined L: Negative  Meniscus Tests McMurray's Test:  Medial Meniscus (Tibial ER): R: Not examined L: Positive for Pain with ER  Lateral Meniscus (Tibial IR): R: Not examined L: Not examined   Patellar Tendinopathy Inferior pole palpation with anterior  tilt: R: Not examined L: Negative  Functional Testing:  Squat: Deferred Sit to Stand: WNL, Reliant on UE in order to control descent Lateral Step Down: Deferred   : 13.99s 5TSTS: 16.15s   TODAY'S TREATMENT: DATE: 07/14/24  Subjective: Patient reports 2-3/10 in bilateral knees. She reports that she had difficulty navigating the stairs, in/out of the car and tub due to pain in both knees. Pt reports that she attempted some of the HEP but had pain with squats. No further questions or concerns.   Therapeutic Exercise:  NuStep L5-1 x 5 min x LE (Seat 8) for LE warm up, endurance and strength; PT manually adjusted resistance throughout bout.   OMEGA Cable Machine:   Seated Knee Extension 3 x 10 20#   Seated Hamstring Curl    1 x 10, 20#   2 x 10, 25#  Therapeutic Activity:   Sit to Stand from elevated Mat Table    3 x 8  from 21 height, no UE Support    Lateral Stepping against resistance    3 x 25', Green TB around thighs     (Seated Rest break)     Debe Edison Squat    2 x 10, 10# KB    PATIENT EDUCATION:  Education details: HEP, Exercise Technique  Person educated: Patient Education method: Explanation, Demonstration, and Handouts Education comprehension: verbalized understanding and returned demonstration   HOME EXERCISE PROGRAM:   Access Code: [email or web address] URL: [email or web address] Date: 07/14/2024 Prepared by: Lonni Pall  Exercises - Supine Heel Slide  - 1 x daily - 3-4 x weekly - 2-3 sets - 10-15 reps - Supine Active Straight Leg Raise  - 1 x daily - 3-4 x weekly - 2-3 sets - 8-10 reps - 5 hold - Long Sitting Quad Set with Towel Roll Under Heel  - 1 x daily - 3-4 x weekly - 2-3 sets - 8-10 reps - Seated Long Arc Quad  - 1 x daily - 7 x weekly - 2-3 sets - 10-15 reps - 3s hold - Mini Squat  - 1 x daily - 3-4 x weekly - 2-3 sets - 8-10 reps - 5s hold - Sidelying Quadriceps Stretch  - 1 x daily - 7 x  weekly - 3 sets - 15-30 reps -  Seated Hamstring Stretch  - 1 x daily - 7 x weekly - 3 sets - 15-30s hold - Child's Pose Stretch  - 1 x daily - 7 x weekly - 3 sets - 15-30s hold  ASSESSMENT:  CLINICAL IMPRESSION:  Patient returned to OPPT for first f/u in management of L knee pain. She tolerated initial session focused on hip and knee strengthening. Multiple seated rest breaks provided to patient in order to mitigate MS fatigue. Patient with good balance during lateral stepping intervention and without pain in the L knee. Sit to stands from elevated mat table with pain however some pain noted with partial squats. PT advised to avoid excessive knee flexion angles with HEP and partial squat to avoid further injuring pain. Based on these findings, patient will benefit from skilled physical therapy in order to address current deficits and maximize return to PLOF.    OBJECTIVE IMPAIRMENTS: decreased activity tolerance, decreased ROM, decreased strength, obesity, and pain.   ACTIVITY LIMITATIONS: lifting, bending, sitting, standing, squatting, and stairs  PARTICIPATION LIMITATIONS: driving and occupation  PERSONAL FACTORS: Age, Past/current experiences, Time since onset of injury/illness/exacerbation, and 3+ comorbidities: Multiple Sclerosis, Chronic Kidney Disease, Obesity, etc. are also affecting patient's functional outcome.   REHAB POTENTIAL: Good  CLINICAL DECISION MAKING: Evolving/moderate complexity  EVALUATION COMPLEXITY: Moderate   GOALS: Goals reviewed with patient? Yes  SHORT TERM GOALS: Target date: 08/11/2024  Pt will be independent with HEP to improve strength and decrease knee pain to improve pain-free function at home and work. Baseline: 07/07/2024: Initial HEP  Goal status: INITIAL   LONG TERM GOALS: Target date: 09/08/2024  Patient will be able to safely navigate a flight of 12 stairs, using proper foot placement and handrail support, without requiring assistance from PT in order to demonstrate  significant improvement in LE strength and safety. Baseline: 07/07/2024: Pain with ascending/descending with LLE.  Goal status: INITIAL  2.  Pt will decrease worst knee pain by at least 3 points on the NPRS in order to demonstrate clinically significant reduction in knee pain. Baseline: 07/07/2024: 8/10 NPS Goal status: INITIAL  3.  Pt will increase LEFS score by at least 9 points in order demonstrate clinically significant reduction in knee pain/disability.       Baseline: 07/07/2024: 28/80 = 35%  Goal status: INITIAL  4.  Pt will increase strength of knee flexion/extension by at least 1/2 MMT grade and without pain in order to demonstrate improvement in strength and function  Baseline: 07/07/2024: L Knee Flex/Ext: 4/5, 4/5* Goal status: INITIAL  5.  Pt will decrease 5TSTS by at least 3 seconds in order to demonstrate clinically significant improvement in LE strength Baseline: 07/07/2024: 16.15s  Goal status: INITIAL  6.  Pt will increase by at least 0.13 m/s in order to demonstrate clinically significant improvement in community ambulation.  Baseline: 07/07/2024: .71 m/s (Limited Tourist information centre manager)  Goal status: INITIAL   PLAN: PT FREQUENCY: 1-2x/week  PT DURATION: 8 weeks  PLANNED INTERVENTIONS: Therapeutic exercises, Therapeutic activity, Neuromuscular re-education, Balance training, Gait training, Patient/Family education, Self Care, Joint mobilization, Joint manipulation, Vestibular training, Canalith repositioning, Orthotic/Fit training, DME instructions, Dry Needling, Electrical stimulation, Spinal manipulation, Spinal mobilization, Cryotherapy, Moist heat, Taping, Traction, Manual therapy, and Re-evaluation.  PLAN FOR NEXT SESSION: Review HEP; Joint mobilization PRN, Quadricep/Hip Strengthening, Progressing functional tasks (sit to stand and stairs).    Lonni Pall PT, DPT Physical Therapist- [place]  07/14/2024, 2:32 PM

## 2024-07-16 ENCOUNTER — Ambulatory Visit

## 2024-07-16 DIAGNOSIS — M25562 Pain in left knee: Secondary | ICD-10-CM

## 2024-07-16 DIAGNOSIS — M25662 Stiffness of left knee, not elsewhere classified: Secondary | ICD-10-CM

## 2024-07-16 DIAGNOSIS — M6281 Muscle weakness (generalized): Secondary | ICD-10-CM

## 2024-07-16 NOTE — Therapy (Signed)
 OUTPATIENT PHYSICAL THERAPY KNEE TREATMENT  Patient Name: Patricia Deleon MRN: [id number] DOB:January 17, 1961, 63 y.o., female Today's Date: 07/16/2024  END OF SESSION:  PT End of Session - 07/16/24 1347     Visit Number 3    Number of Visits 17    Date for PT Re-Evaluation 09/01/24    PT Start Time 1346    PT Stop Time 1425    PT Time Calculation (min) 39 min    Activity Tolerance Patient tolerated treatment well    Behavior During Therapy WFL for tasks assessed/performed           Past Medical History:  Diagnosis Date   Agoraphobia with panic disorder    Anemia    Depression    Erosive gastritis    Fundic gland polyps of stomach, benign    Geographic tongue    GERD (gastroesophageal reflux disease)    History of esophagogastroduodenoscopy (EGD)    Hypercholesterolemia    Hypertension    Intervertebral disc disorder    thoracic,thoracolumber,lumbosacral   Multiple sclerosis (HCC)    Obesity    Sleep apnea    Past Surgical History:  Procedure Laterality Date   COLONOSCOPY     COLONOSCOPY WITH PROPOFOL  N/A 07/07/2018   Procedure: COLONOSCOPY WITH PROPOFOL ;  Surgeon: Patricia Patricia PENNER, MD;  Location: [facility] ENDOSCOPY;  Service: Endoscopy;  Laterality: N/A;   ESOPHAGOGASTRODUODENOSCOPY (EGD) WITH PROPOFOL  N/A 01/31/2016   Procedure: ESOPHAGOGASTRODUODENOSCOPY (EGD) WITH PROPOFOL ;  Surgeon: Patricia Deleon Gaylyn, MD;  Location: [facility] ENDOSCOPY;  Service: Endoscopy;  Laterality: N/A;   IR FLUORO GUIDE CV LINE RIGHT  10/06/2018   IR REMOVAL TUN CV CATH W/O FL  10/15/2018   IR US  GUIDE VASC ACCESS RIGHT  10/06/2018   NISSEN FUNDOPLICATION     Patient Active Problem List   Diagnosis Date Noted   Chronic kidney disease, stage 2, mildly decreased GFR 02/12/2024   Annual physical exam 02/12/2024   Prediabetes 01/15/2023   Morbid obesity (HCC) 01/15/2023   Mixed hyperlipidemia 01/15/2023   Depression, recurrent (HCC) 01/15/2023   Urinary tract infection, recurrent 07/11/2022    UTI symptoms 06/14/2022   Moderate episode of recurrent major depressive disorder (HCC) 05/28/2019   Excessive daytime sleepiness 08/25/2018   Multiple sclerosis (HCC) 07/08/2018   Neck pain 07/08/2018   Transient vision disturbance of left eye 06/18/2018   Urge incontinence 06/18/2018   Numbness 06/18/2018   Acne rosacea 05/07/2016   Gastroesophageal reflux disease without esophagitis 09/28/2015   Displacement of lumbar intervertebral disc without myelopathy 09/28/2015   Insomnia 09/28/2015   Thoracic, 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   Adiposity 09/28/2015   Restless leg 09/28/2015   Severe obstructive sleep apnea 09/28/2015   Vitamin D  deficiency 09/28/2015   Hypothyroidism 07/11/2015   Hypertension 06/09/2015    PCP: Patricia Lauraine SAILOR, DO  REFERRING PROVIDER: Donzella Lauraine SAILOR, DO  REFERRING DIAG:  [phone] (ICD-10-CM) - Acute pain of left knee  G35 (ICD-10-CM) - Multiple sclerosis (HCC)    RATIONALE FOR EVALUATION AND TREATMENT: Rehabilitation  THERAPY DIAG: Acute pain of left knee  Muscle weakness (generalized)  Stiffness of left knee, not elsewhere classified  ONSET DATE: Chronic   FOLLOW-UP APPT SCHEDULED WITH REFERRING PROVIDER: Yes August 21, 2024    SUBJECTIVE:  SUBJECTIVE STATEMENT:    Patient reporting to OPPT with a c/c of L knee pain.   PERTINENT HISTORY:   [name] is a 64 y.o. patient arriving to OPPT with L knee pain. She denies MOI or trauma to the knee. She reports that her knee will lock then pop with a relieving sensation. Additionally she reports that restricted ROM in her L knee compared to R knee. Patient reports aggravating factors: stairway navigation (both directions), sleeping at night. At night she  describes as the pain worsening as it radiates from the L knee distal into the ankle/foot and intermittently it will travel to the L hip. Relieving factors include: daily supplements, gabapentin , ice modalities.   Red flags: chills/fever, night sweats, unexplained weight gain/loss, unrelenting pain  PAIN:   Pain Intensity: Present: 0/10, Best: 0/10, Worst: 8/10 Pain location: L knee Pain quality: intermittent, aching, and throbbing  Radiating pain: Yes  Swelling: No  Popping, catching, locking: Yes  Numbness/Tingling: No Focal weakness or buckling: Yes 24-hour pain behavior: PM - Activity Dependent  History of prior back, hip, or knee injury, pain, surgery, or therapy: Yes  PRECAUTIONS: Fall, Low   WEIGHT BEARING RESTRICTIONS: No  FALLS: Has patient fallen in last 6 months? No  Living Environment Lives with: lives with their family Husband, Daughter, Son, Patricia Deleon (3 yr old), 3 Dogs Lives in: House/apartment Stairs: Yes: Internal: 12-14 steps; on right going up Has following equipment at home: None  Prior level of function: Independent  Occupational demands: Works at [employer] (M-F, some Sat, 8-6)  Hobbies: None   Patient Goals: Patient would like to be able to perform stairway navigation without pain   OBJECTIVE:   Patient Surveys  LEFS: 28/80 = 35%    Cognition Cognition is within normal limits.     Gross Musculoskeletal Assessment Tremor: None Bulk: Normal Tone: Normal  GAIT: Distance walked: 47m Assistive device utilized: None Level of assistance: Complete Independence Comments: Reciprocal Gait, decreased velocity   Posture: Sitting: WNL Standing: WNL    AROM AROM (Normal range in degrees) AROM  Hip Right Left  Flexion (125) 90   Extension (15)    Abduction (40)    Adduction     Internal Rotation (45)    External Rotation (45)        Knee    Flexion (135) 110 110 *   Extension (0) 0 0      Ankle    Dorsiflexion (20)     Plantarflexion (50)    Inversion (35)    Eversion (15    (* = pain; Blank rows = not tested)  LE MMT: MMT (out of 5) Right Left  Hip flexion 4 4  Hip extension    Hip abduction (Seated) 4+ 4+  Hip adduction 5 5  Hip internal rotation    Hip external rotation    Knee flexion 4 4  Knee extension 4 4*  Ankle dorsiflexion 5 5  Ankle plantarflexion 5 5  Ankle inversion    Ankle eversion    (* = pain; Blank rows = not tested)  Sensation Grossly intact to light touch bilateral LEs as determined by testing dermatomes L2-S2. Proprioception, and hot/cold testing deferred on this date.  Reflexes R/L Knee Jerk (L3/4): 2+/2+  Ankle Jerk (S1/2): 2+/2+   Muscle Length Hamstrings: R: Not examined L: Negative Quadriceps Arvid): R: Not examined L: Not examined   Palpation Location LEFT  RIGHT  Quadriceps 0   Medial Hamstrings 0   Lateral Hamstrings 0   Lateral Hamstring tendon 0   Medial Hamstring tendon 0   Quadriceps tendon 0   Patella    Patellar Tendon    Tibial Tuberosity    Medial joint line  2*    Lateral joint line 1   MCL 1   LCL    Adductor Tubercle    Pes Anserine tendon 1   Infrapatellar fat pad    Fibular head    Popliteal fossa    (Blank rows = not tested) Graded on 0-4 scale (0 = no pain, 1 = pain, 2 = pain with wincing/grimacing/flinching, 3 = pain with withdrawal, 4 = unwilling to allow palpation), (Blank rows = not tested)  Passive Accessory Motion Deferred  VASCULAR Dorsalis pedis and posterior tibial pulses are palpable   SPECIAL TESTS  Ligamentous Stability  MCL: Valgus Stress (30 degrees flexion): R: Not examined L: Negative  LCL: Varus Stress (30 degrees flexion): R: Not examined L: Negative  Meniscus Tests McMurray's Test:  Medial Meniscus (Tibial ER): R: Not examined L: Positive for Pain with ER  Lateral Meniscus (Tibial IR): R: Not examined L: Not examined   Patellar Tendinopathy Inferior pole palpation with anterior  tilt: R: Not examined L: Negative  Functional Testing:  Squat: Deferred Sit to Stand: WNL, Reliant on UE in order to control descent Lateral Step Down: Deferred   : 13.99s 5TSTS: 16.15s   TODAY'S TREATMENT: DATE: 07/16/24  Subjective: Patient reports that she was able to walk up her stairs 2 times without pain and use of rail support. Patient reports ache and pressure in the L knee. Lots of pain in the R knee yesterday but dissipated prior today's session. No further questions or concerns.   Therapeutic Exercise:  NuStep L5-2 x 5 min x LE only - > 90 SPM (Seat 8) for LE warm up, endurance and strength; PT manually adjusted resistance throughout bout.   OMEGA Cable Machine:   Seated Knee Extension 3 x 10, 35#    Seated Hamstring Curl    3 x 10, 35#  Bilateral Heel Stretch and Raise on 1st stair step   1 x 15 x 2s hold with stretch  1 x 10 x 2s hold with stretch  Therapeutic Activity:   Forward Step Up onto 6 Step    R/L: 3 x 8 ea leg    Kettle Bell Squat from 6 stool    1 x 10, 10# KB    2 x 8, 20# KB   PATIENT EDUCATION:  Education details: HEP, Exercise Technique  Person educated: Patient Education method: Explanation, Demonstration, and Handouts Education comprehension: verbalized understanding and returned demonstration   HOME EXERCISE PROGRAM:   Access Code: [email or web address] URL: [email or web address] Date: 07/16/2024 Prepared by: Lonni Pall  Exercises - Supine Heel Slide  - 1 x daily - 3-4 x weekly - 2-3 sets - 10-15 reps - Supine Active Straight Leg Raise  - 1 x daily - 3-4 x weekly - 2-3 sets - 8-10 reps - 5 hold - Long Sitting Quad Set with Towel Roll Under Heel  - 1 x daily - 3-4 x weekly - 2-3 sets - 8-10 reps - Seated Long Arc Quad  - 1 x daily - 7 x weekly - 2-3 sets - 10-15 reps - 3s hold - Mini Squat  - 1 x daily - 3-4 x weekly - 2-3 sets - 8-10 reps - 5s hold - Sidelying  Quadriceps Stretch  - 1 x daily - 7 x weekly - 3 sets -  15-30 reps - Seated Hamstring Stretch  - 1 x daily - 7 x weekly - 3 sets - 15-30s hold  ASSESSMENT:  CLINICAL IMPRESSION: Patient arrived to OPPT with a main focus on improving L Knee pain. Patient tolerated increase in resistance LE strengthening exercises. No report of additional pain in knees.  Forward step ups with single rail support provided in order to improve stair way capacity at home. Good return demonstration of kettle bell squat with increase weight.PT updated HEP and removed child's pose due to report of knee pressure. Multiple seated rest breaks provided to patient in order to mitigate MS fatigue. PT will continue to progressively increase resistance based exercises in order to improve knee stability. Based on today's performance, patient will benefit from skilled physical therapy in order to address current deficits and maximize return to PLOF.     OBJECTIVE IMPAIRMENTS: decreased activity tolerance, decreased ROM, decreased strength, obesity, and pain.   ACTIVITY LIMITATIONS: lifting, bending, sitting, standing, squatting, and stairs  PARTICIPATION LIMITATIONS: driving and occupation  PERSONAL FACTORS: Age, Past/current experiences, Time since onset of injury/illness/exacerbation, and 3+ comorbidities: Multiple Sclerosis, Chronic Kidney Disease, Obesity, etc. are also affecting patient's functional outcome.   REHAB POTENTIAL: Good  CLINICAL DECISION MAKING: Evolving/moderate complexity  EVALUATION COMPLEXITY: Moderate   GOALS: Goals reviewed with patient? Yes  SHORT TERM GOALS: Target date: 08/13/2024  Pt will be independent with HEP to improve strength and decrease knee pain to improve pain-free function at home and work. Baseline: 07/07/2024: Initial HEP  Goal status: INITIAL   LONG TERM GOALS: Target date: 09/10/2024  Patient will be able to safely navigate a flight of 12 stairs, using proper foot placement and handrail support, without requiring assistance from  PT in order to demonstrate significant improvement in LE strength and safety. Baseline: 07/07/2024: Pain with ascending/descending with LLE.  Goal status: INITIAL  2.  Pt will decrease worst knee pain by at least 3 points on the NPRS in order to demonstrate clinically significant reduction in knee pain. Baseline: 07/07/2024: 8/10 NPS Goal status: INITIAL  3.  Pt will increase LEFS score by at least 9 points in order demonstrate clinically significant reduction in knee pain/disability.       Baseline: 07/07/2024: 28/80 = 35%  Goal status: INITIAL  4.  Pt will increase strength of knee flexion/extension by at least 1/2 MMT grade and without pain in order to demonstrate improvement in strength and function  Baseline: 07/07/2024: L Knee Flex/Ext: 4/5, 4/5* Goal status: INITIAL  5.  Pt will decrease 5TSTS by at least 3 seconds in order to demonstrate clinically significant improvement in LE strength Baseline: 07/07/2024: 16.15s  Goal status: INITIAL  6.  Pt will increase by at least 0.13 m/s in order to demonstrate clinically significant improvement in community ambulation.  Baseline: 07/07/2024: .71 m/s (Limited Tourist information centre manager)  Goal status: INITIAL   PLAN: PT FREQUENCY: 1-2x/week  PT DURATION: 8 weeks  PLANNED INTERVENTIONS: Therapeutic exercises, Therapeutic activity, Neuromuscular re-education, Balance training, Gait training, Patient/Family education, Self Care, Joint mobilization, Joint manipulation, Vestibular training, Canalith repositioning, Orthotic/Fit training, DME instructions, Dry Needling, Electrical stimulation, Spinal manipulation, Spinal mobilization, Cryotherapy, Moist heat, Taping, Traction, Manual therapy, and Re-evaluation.  PLAN FOR NEXT SESSION: Review HEP; Joint mobilization PRN, Quadricep/Hip Strengthening, Progressing functional tasks (sit to stand and stairs).    Lonni Pall PT, DPT Physical Therapist- [place]  07/16/2024, 1:48  PM

## 2024-07-22 ENCOUNTER — Ambulatory Visit

## 2024-08-04 ENCOUNTER — Ambulatory Visit: Attending: Family Medicine

## 2024-08-04 DIAGNOSIS — M6281 Muscle weakness (generalized): Secondary | ICD-10-CM | POA: Diagnosis present

## 2024-08-04 DIAGNOSIS — M25562 Pain in left knee: Secondary | ICD-10-CM | POA: Diagnosis present

## 2024-08-04 DIAGNOSIS — M25662 Stiffness of left knee, not elsewhere classified: Secondary | ICD-10-CM | POA: Insufficient documentation

## 2024-08-04 NOTE — Therapy (Signed)
 OUTPATIENT PHYSICAL THERAPY KNEE TREATMENT  Patient Name: Patricia Deleon MRN: [id number] DOB:[birth year], 63 y.o., female Today's Date: 08/04/2024  END OF SESSION:  PT End of Session - 08/04/24 0948     Visit Number 4    Number of Visits 17    Date for PT Re-Evaluation 09/01/24    PT Start Time 0948    PT Stop Time 1030    PT Time Calculation (min) 42 min    Activity Tolerance Patient tolerated treatment well    Behavior During Therapy WFL for tasks assessed/performed           Past Medical History:  Diagnosis Date   Agoraphobia with panic disorder    Anemia    Depression    Erosive gastritis    Fundic gland polyps of stomach, benign    Geographic tongue    GERD (gastroesophageal reflux disease)    History of esophagogastroduodenoscopy (EGD)    Hypercholesterolemia    Hypertension    Intervertebral disc disorder    thoracic,thoracolumber,lumbosacral   Multiple sclerosis (HCC)    Obesity    Sleep apnea    Past Surgical History:  Procedure Laterality Date   COLONOSCOPY     COLONOSCOPY WITH PROPOFOL  N/A 07/07/2018   Procedure: COLONOSCOPY WITH PROPOFOL ;  Surgeon: Gaylyn Gladis PENNER, MD;  Location: [facility] ENDOSCOPY;  Service: Endoscopy;  Laterality: N/A;   ESOPHAGOGASTRODUODENOSCOPY (EGD) WITH PROPOFOL  N/A 01/31/2016   Procedure: ESOPHAGOGASTRODUODENOSCOPY (EGD) WITH PROPOFOL ;  Surgeon: Gladis PENNER Gaylyn, MD;  Location: [facility] ENDOSCOPY;  Service: Endoscopy;  Laterality: N/A;   IR FLUORO GUIDE CV LINE RIGHT  10/06/2018   IR REMOVAL TUN CV CATH W/O FL  10/15/2018   IR US  GUIDE VASC ACCESS RIGHT  10/06/2018   NISSEN FUNDOPLICATION     Patient Active Problem List   Diagnosis Date Noted   Chronic kidney disease, stage 2, mildly decreased GFR 02/12/2024   Annual physical exam 02/12/2024   Prediabetes 01/15/2023   Morbid obesity (HCC) 01/15/2023   Mixed hyperlipidemia 01/15/2023   Depression, recurrent (HCC) 01/15/2023   Urinary tract infection, recurrent 07/11/2022    UTI symptoms 06/14/2022   Moderate episode of recurrent major depressive disorder (HCC) 05/28/2019   Excessive daytime sleepiness 08/25/2018   Multiple sclerosis (HCC) 07/08/2018   Neck pain 07/08/2018   Transient vision disturbance of left eye 06/18/2018   Urge incontinence 06/18/2018   Numbness 06/18/2018   Acne rosacea 05/07/2016   Gastroesophageal reflux disease without esophagitis 09/28/2015   Displacement of lumbar intervertebral disc without myelopathy 09/28/2015   Insomnia 09/28/2015   Thoracic, 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   Adiposity 09/28/2015   Restless leg 09/28/2015   Severe obstructive sleep apnea 09/28/2015   Vitamin D  deficiency 09/28/2015   Hypothyroidism 07/11/2015   Hypertension 06/09/2015    PCP: Donzella Lauraine SAILOR, DO  REFERRING PROVIDER: Donzella Lauraine SAILOR, DO  REFERRING DIAG:  [phone] (ICD-10-CM) - Acute pain of left knee  G35 (ICD-10-CM) - Multiple sclerosis (HCC)    RATIONALE FOR EVALUATION AND TREATMENT: Rehabilitation  THERAPY DIAG: Muscle weakness (generalized)  Acute pain of left knee  Stiffness of left knee, not elsewhere classified  ONSET DATE: Chronic   FOLLOW-UP APPT SCHEDULED WITH REFERRING PROVIDER: Yes August 21, 2024    SUBJECTIVE:  SUBJECTIVE STATEMENT:    Patient reporting to OPPT with a c/c of L knee pain.   PERTINENT HISTORY:   Patricia Deleon is a 63 y.o. patient arriving to OPPT with L knee pain. She denies MOI or trauma to the knee. She reports that her knee will lock then pop with a relieving sensation. Additionally she reports that restricted ROM in her L knee compared to R knee. Patient reports aggravating factors: stairway navigation (both directions), sleeping at night. At night she  describes as the pain worsening as it radiates from the L knee distal into the ankle/foot and intermittently it will travel to the L hip. Relieving factors include: daily supplements, gabapentin , ice modalities.   Red flags: chills/fever, night sweats, unexplained weight gain/loss, unrelenting pain  PAIN:   Pain Intensity: Present: 0/10, Best: 0/10, Worst: 8/10 Pain location: L knee Pain quality: intermittent, aching, and throbbing  Radiating pain: Yes  Swelling: No  Popping, catching, locking: Yes  Numbness/Tingling: No Focal weakness or buckling: Yes 24-hour pain behavior: PM - Activity Dependent  History of prior back, hip, or knee injury, pain, surgery, or therapy: Yes  PRECAUTIONS: Fall, Low   WEIGHT BEARING RESTRICTIONS: No  FALLS: Has patient fallen in last 6 months? No  Living Environment Lives with: lives with their family Husband, Daughter, Son, Darden (3 yr old), 3 Dogs Lives in: House/apartment Stairs: Yes: Internal: 12-14 steps; on right going up Has following equipment at home: None  Prior level of function: Independent  Occupational demands: Works at [employer] (M-F, some Sat, 8-6)  Hobbies: None   Patient Goals: Patient would like to be able to perform stairway navigation without pain   OBJECTIVE:   Patient Surveys  LEFS: 28/80 = 35%    Cognition Cognition is within normal limits.     Gross Musculoskeletal Assessment Tremor: None Bulk: Normal Tone: Normal  GAIT: Distance walked: 54m Assistive device utilized: None Level of assistance: Complete Independence Comments: Reciprocal Gait, decreased velocity   Posture: Sitting: WNL Standing: WNL    AROM AROM (Normal range in degrees) AROM  Hip Right Left  Flexion (125) 90   Extension (15)    Abduction (40)    Adduction     Internal Rotation (45)    External Rotation (45)        Knee    Flexion (135) 110 110 *   Extension (0) 0 0      Ankle    Dorsiflexion (20)     Plantarflexion (50)    Inversion (35)    Eversion (15    (* = pain; Blank rows = not tested)  LE MMT: MMT (out of 5) Right Left  Hip flexion 4 4  Hip extension    Hip abduction (Seated) 4+ 4+  Hip adduction 5 5  Hip internal rotation    Hip external rotation    Knee flexion 4 4  Knee extension 4 4*  Ankle dorsiflexion 5 5  Ankle plantarflexion 5 5  Ankle inversion    Ankle eversion    (* = pain; Blank rows = not tested)  Sensation Grossly intact to light touch bilateral LEs as determined by testing dermatomes L2-S2. Proprioception, and hot/cold testing deferred on this date.  Reflexes R/L Knee Jerk (L3/4): 2+/2+  Ankle Jerk (S1/2): 2+/2+   Muscle Length Hamstrings: R: Not examined L: Negative Quadriceps Arvid): R: Not examined L: Not examined   Palpation Location LEFT  RIGHT  Quadriceps 0   Medial Hamstrings 0   Lateral Hamstrings 0   Lateral Hamstring tendon 0   Medial Hamstring tendon 0   Quadriceps tendon 0   Patella    Patellar Tendon    Tibial Tuberosity    Medial joint line  2*    Lateral joint line 1   MCL 1   LCL    Adductor Tubercle    Pes Anserine tendon 1   Infrapatellar fat pad    Fibular head    Popliteal fossa    (Blank rows = not tested) Graded on 0-4 scale (0 = no pain, 1 = pain, 2 = pain with wincing/grimacing/flinching, 3 = pain with withdrawal, 4 = unwilling to allow palpation), (Blank rows = not tested)  Passive Accessory Motion Deferred  VASCULAR Dorsalis pedis and posterior tibial pulses are palpable   SPECIAL TESTS  Ligamentous Stability  MCL: Valgus Stress (30 degrees flexion): R: Not examined L: Negative  LCL: Varus Stress (30 degrees flexion): R: Not examined L: Negative  Meniscus Tests McMurray's Test:  Medial Meniscus (Tibial ER): R: Not examined L: Positive for Pain with ER  Lateral Meniscus (Tibial IR): R: Not examined L: Not examined   Patellar Tendinopathy Inferior pole palpation with anterior  tilt: R: Not examined L: Negative  Functional Testing:  Squat: Deferred Sit to Stand: WNL, Reliant on UE in order to control descent Lateral Step Down: Deferred   : 13.99s 5TSTS: 16.15s   TODAY'S TREATMENT: DATE: 08/04/24  Subjective: Following 1 week hiatus patient reports moderate pain (LLE > RLE) following the last PT session. Pt reports that popping sensation is hurting the LLE rather than relief. Pain mostly noticed with walking and transitioning from sit to stand. She still reports nocturnal pain in knee that radiates into foot. Currently 6/10 pain in the L knee prior to start of session. No further questions or concerns.   Therapeutic Exercise:  OMEGA Cable Machine:   Seated Knee Extension 3 x 12 - 15#    Seated Hamstring Curl    3 x 10, 35#   Seated Hamstring Stretch - LLE Placed on stool    30s/bout x 3 in order to improve ROM and tissue extensibility  Time spent on Pt education on proper frequency/sets/reps for HEP in order to reduce stiffness and pain symptoms in LLE.   Therapeutic Activity:   NuStep L3-2 x 5 min x LE only - > 90 SPM (Seat 8) for LE warm up, endurance and strength; PT manually adjusted resistance throughout per patient's tolerance. Adjusted to lower resistance due to minor pain.   Sit to Stand from elevated mat table (21 height)   1 x 10 - AROM  2 x 10 - 3 Kg MB  Lateral Stepping against resistance  4 x 12' -  AROM in order to increase confidence   4 x 12' -  Against Green TB around thigh   4 x 12' - against GTB around thigh    PATIENT EDUCATION:  Education details: HEP, Exercise Technique  Person educated: Patient Education method: Explanation, Demonstration, and Handouts Education comprehension: verbalized understanding and returned demonstration   HOME EXERCISE PROGRAM:   Access Code: [email or web address] URL: [email or web address] Date: 07/16/2024 Prepared by: Lonni Pall  Exercises - Supine Heel Slide  - 1 x daily  - 3-4 x weekly - 2-3 sets - 10-15 reps - Supine Active Straight Leg Raise  - 1 x daily - 3-4 x weekly - 2-3 sets - 8-10 reps -  5 hold - Long Sitting Quad Set with Towel Roll Under Heel  - 1 x daily - 3-4 x weekly - 2-3 sets - 8-10 reps - Seated Long Arc Quad  - 1 x daily - 7 x weekly - 2-3 sets - 10-15 reps - 3s hold - Mini Squat  - 1 x daily - 3-4 x weekly - 2-3 sets - 8-10 reps - 5s hold - Sidelying Quadriceps Stretch  - 1 x daily - 7 x weekly - 3 sets - 15-30 reps - Seated Hamstring Stretch  - 1 x daily - 7 x weekly - 3 sets - 15-30s hold  ASSESSMENT:  CLINICAL IMPRESSION: Continued PT POC with a focus on improving L knee pain, motor control and strength. PT regressed intensity with [place] resistance due to adverse response following last session. Great demonstration of stability and lateral strength against resistance with lateral stepping. Antalgic gait pattern at beginning of the session; significant improvements in pain and gait  following PT interventions. Multiple seated rest breaks provided to patient in order to mitigate MS fatigue. PT will continue to progressively increase resistance based exercises in order to improve knee stability. Based on today's performance, patient will benefit from skilled physical therapy in order to address current deficits and maximize return to PLOF.    OBJECTIVE IMPAIRMENTS: decreased activity tolerance, decreased ROM, decreased strength, obesity, and pain.   ACTIVITY LIMITATIONS: lifting, bending, sitting, standing, squatting, and stairs  PARTICIPATION LIMITATIONS: driving and occupation  PERSONAL FACTORS: Age, Past/current experiences, Time since onset of injury/illness/exacerbation, and 3+ comorbidities: Multiple Sclerosis, Chronic Kidney Disease, Obesity, etc. are also affecting patient's functional outcome.   REHAB POTENTIAL: Good  CLINICAL DECISION MAKING: Evolving/moderate complexity  EVALUATION COMPLEXITY: Moderate   GOALS: Goals reviewed  with patient? Yes  SHORT TERM GOALS: Target date: 09/01/2024  Pt will be independent with HEP to improve strength and decrease knee pain to improve pain-free function at home and work. Baseline: 07/07/2024: Initial HEP  Goal status: INITIAL   LONG TERM GOALS: Target date: 09/29/2024  Patient will be able to safely navigate a flight of 12 stairs, using proper foot placement and handrail support, without requiring assistance from PT in order to demonstrate significant improvement in LE strength and safety. Baseline: 07/07/2024: Pain with ascending/descending with LLE.  Goal status: INITIAL  2.  Pt will decrease worst knee pain by at least 3 points on the NPRS in order to demonstrate clinically significant reduction in knee pain. Baseline: 07/07/2024: 8/10 NPS Goal status: INITIAL  3.  Pt will increase LEFS score by at least 9 points in order demonstrate clinically significant reduction in knee pain/disability.       Baseline: 07/07/2024: 28/80 = 35%  Goal status: INITIAL  4.  Pt will increase strength of knee flexion/extension by at least 1/2 MMT grade and without pain in order to demonstrate improvement in strength and function  Baseline: 07/07/2024: L Knee Flex/Ext: 4/5, 4/5* Goal status: INITIAL  5.  Pt will decrease 5TSTS by at least 3 seconds in order to demonstrate clinically significant improvement in LE strength Baseline: 07/07/2024: 16.15s  Goal status: INITIAL  6.  Pt will increase by at least 0.13 m/s in order to demonstrate clinically significant improvement in community ambulation.  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  PLAN FOR NEXT SESSION: Review HEP; Joint mobilization PRN, Quadricep/Hip Strengthening, Progressing functional tasks (sit to stand and stairs).    Lonni Pall PT, DPT Physical Therapist- [place]  08/04/2024, 12:24 PM

## 2024-08-06 ENCOUNTER — Ambulatory Visit

## 2024-08-06 DIAGNOSIS — M6281 Muscle weakness (generalized): Secondary | ICD-10-CM

## 2024-08-06 DIAGNOSIS — M25562 Pain in left knee: Secondary | ICD-10-CM

## 2024-08-06 DIAGNOSIS — M25662 Stiffness of left knee, not elsewhere classified: Secondary | ICD-10-CM

## 2024-08-06 NOTE — Therapy (Signed)
 OUTPATIENT PHYSICAL THERAPY KNEE TREATMENT  Patient Name: Patricia Deleon MRN: [id number] DOB:1961-08-28, 63 y.o., female Today's Date: 08/06/2024  END OF SESSION:  PT End of Session - 08/06/24 0808     Visit Number 5    Number of Visits 17    Date for PT Re-Evaluation 09/01/24    PT Start Time 0810    PT Stop Time 0850    PT Time Calculation (min) 40 min    Activity Tolerance Patient tolerated treatment well    Behavior During Therapy [facility] for tasks assessed/performed           Past Medical History:  Diagnosis Date   Agoraphobia with panic disorder    Anemia    Depression    Erosive gastritis    Fundic gland polyps of stomach, benign    Geographic tongue    GERD (gastroesophageal reflux disease)    History of esophagogastroduodenoscopy (EGD)    Hypercholesterolemia    Hypertension    Intervertebral disc disorder    thoracic,thoracolumber,lumbosacral   Multiple sclerosis (HCC)    Obesity    Sleep apnea    Past Surgical History:  Procedure Laterality Date   COLONOSCOPY     COLONOSCOPY WITH PROPOFOL  N/A 07/07/2018   Procedure: COLONOSCOPY WITH PROPOFOL ;  Surgeon: Gaylyn Gladis PENNER, MD;  Location: [facility] ENDOSCOPY;  Service: Endoscopy;  Laterality: N/A;   ESOPHAGOGASTRODUODENOSCOPY (EGD) WITH PROPOFOL  N/A 01/31/2016   Procedure: ESOPHAGOGASTRODUODENOSCOPY (EGD) WITH PROPOFOL ;  Surgeon: Gladis PENNER Gaylyn, MD;  Location: [facility] ENDOSCOPY;  Service: Endoscopy;  Laterality: N/A;   IR FLUORO GUIDE CV LINE RIGHT  10/06/2018   IR REMOVAL TUN CV CATH W/O FL  10/15/2018   IR US  GUIDE VASC ACCESS RIGHT  10/06/2018   NISSEN FUNDOPLICATION     Patient Active Problem List   Diagnosis Date Noted   Chronic kidney disease, stage 2, mildly decreased GFR 02/12/2024   Annual physical exam 02/12/2024   Prediabetes 01/15/2023   Morbid obesity (HCC) 01/15/2023   Mixed hyperlipidemia 01/15/2023   Depression, recurrent (HCC) 01/15/2023   Urinary tract infection, recurrent 07/11/2022    UTI symptoms 06/14/2022   Moderate episode of recurrent major depressive disorder (HCC) 05/28/2019   Excessive daytime sleepiness 08/25/2018   Multiple sclerosis (HCC) 07/08/2018   Neck pain 07/08/2018   Transient vision disturbance of left eye 06/18/2018   Urge incontinence 06/18/2018   Numbness 06/18/2018   Acne rosacea 05/07/2016   Gastroesophageal reflux disease without esophagitis 09/28/2015   Displacement of lumbar intervertebral disc without myelopathy 09/28/2015   Insomnia 09/28/2015   Thoracic, 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   Adiposity 09/28/2015   Restless leg 09/28/2015   Severe obstructive sleep apnea 09/28/2015   Vitamin D  deficiency 09/28/2015   Hypothyroidism 07/11/2015   Hypertension 06/09/2015    PCP: Donzella Lauraine SAILOR, DO  REFERRING PROVIDER: Donzella Lauraine SAILOR, DO  REFERRING DIAG:  [phone] (ICD-10-CM) - Acute pain of left knee  G35 (ICD-10-CM) - Multiple sclerosis (HCC)    RATIONALE FOR EVALUATION AND TREATMENT: Rehabilitation  THERAPY DIAG: Muscle weakness (generalized)  Acute pain of left knee  Stiffness of left knee, not elsewhere classified  ONSET DATE: Chronic   FOLLOW-UP APPT SCHEDULED WITH REFERRING PROVIDER: Yes August 21, 2024    SUBJECTIVE:  SUBJECTIVE STATEMENT:    Patient reporting to OPPT with a c/c of L knee pain.   PERTINENT HISTORY:   Patricia Deleon is a 63 y.o. patient arriving to OPPT with L knee pain. She denies MOI or trauma to the knee. She reports that her knee will lock then pop with a relieving sensation. Additionally she reports that restricted ROM in her L knee compared to R knee. Patient reports aggravating factors: stairway navigation (both directions), sleeping at night. At night she  describes as the pain worsening as it radiates from the L knee distal into the ankle/foot and intermittently it will travel to the L hip. Relieving factors include: daily supplements, gabapentin , ice modalities.   Red flags: chills/fever, night sweats, unexplained weight gain/loss, unrelenting pain  PAIN:   Pain Intensity: Present: 0/10, Best: 0/10, Worst: 8/10 Pain location: L knee Pain quality: intermittent, aching, and throbbing  Radiating pain: Yes  Swelling: No  Popping, catching, locking: Yes  Numbness/Tingling: No Focal weakness or buckling: Yes 24-hour pain behavior: PM - Activity Dependent  History of prior back, hip, or knee injury, pain, surgery, or therapy: Yes  PRECAUTIONS: Fall, Low   WEIGHT BEARING RESTRICTIONS: No  FALLS: Has patient fallen in last 6 months? No  Living Environment Lives with: lives with their family Husband, Daughter, Son, Darden (3 yr old), 3 Dogs Lives in: House/apartment Stairs: Yes: Internal: 12-14 steps; on right going up Has following equipment at home: None  Prior level of function: Independent  Occupational demands: Works at [employer] (M-F, some Sat, 8-6)  Hobbies: None   Patient Goals: Patient would like to be able to perform stairway navigation without pain   OBJECTIVE:   Patient Surveys  LEFS: 28/80 = 35%    Cognition Cognition is within normal limits.     Gross Musculoskeletal Assessment Tremor: None Bulk: Normal Tone: Normal  GAIT: Distance walked: 57m Assistive device utilized: None Level of assistance: Complete Independence Comments: Reciprocal Gait, decreased velocity   Posture: Sitting: WNL Standing: WNL    AROM AROM (Normal range in degrees) AROM  Hip Right Left  Flexion (125) 90   Extension (15)    Abduction (40)    Adduction     Internal Rotation (45)    External Rotation (45)        Knee    Flexion (135) 110 110 *   Extension (0) 0 0      Ankle    Dorsiflexion (20)     Plantarflexion (50)    Inversion (35)    Eversion (15    (* = pain; Blank rows = not tested)  LE MMT: MMT (out of 5) Right Left  Hip flexion 4 4  Hip extension    Hip abduction (Seated) 4+ 4+  Hip adduction 5 5  Hip internal rotation    Hip external rotation    Knee flexion 4 4  Knee extension 4 4*  Ankle dorsiflexion 5 5  Ankle plantarflexion 5 5  Ankle inversion    Ankle eversion    (* = pain; Blank rows = not tested)  Sensation Grossly intact to light touch bilateral LEs as determined by testing dermatomes L2-S2. Proprioception, and hot/cold testing deferred on this date.  Reflexes R/L Knee Jerk (L3/4): 2+/2+  Ankle Jerk (S1/2): 2+/2+   Muscle Length Hamstrings: R: Not examined L: Negative Quadriceps Arvid): R: Not examined L: Not examined   Palpation Location LEFT  RIGHT  Quadriceps 0   Medial Hamstrings 0   Lateral Hamstrings 0   Lateral Hamstring tendon 0   Medial Hamstring tendon 0   Quadriceps tendon 0   Patella    Patellar Tendon    Tibial Tuberosity    Medial joint line  2*    Lateral joint line 1   MCL 1   LCL    Adductor Tubercle    Pes Anserine tendon 1   Infrapatellar fat pad    Fibular head    Popliteal fossa    (Blank rows = not tested) Graded on 0-4 scale (0 = no pain, 1 = pain, 2 = pain with wincing/grimacing/flinching, 3 = pain with withdrawal, 4 = unwilling to allow palpation), (Blank rows = not tested)  Passive Accessory Motion Deferred  VASCULAR Dorsalis pedis and posterior tibial pulses are palpable   SPECIAL TESTS  Ligamentous Stability  MCL: Valgus Stress (30 degrees flexion): R: Not examined L: Negative  LCL: Varus Stress (30 degrees flexion): R: Not examined L: Negative  Meniscus Tests McMurray's Test:  Medial Meniscus (Tibial ER): R: Not examined L: Positive for Pain with ER  Lateral Meniscus (Tibial IR): R: Not examined L: Not examined   Patellar Tendinopathy Inferior pole palpation with anterior  tilt: R: Not examined L: Negative  Functional Testing:  Squat: Deferred Sit to Stand: WNL, Reliant on UE in order to control descent Lateral Step Down: Deferred   : 13.99s 5TSTS: 16.15s   TODAY'S TREATMENT: DATE: 08/06/24  Subjective: Patient arrives with no new reports. She reports improvements in pain since last session; able to perform alternating step pattern with stair navigation. She reports 0/10 pain in the L knee (Sitting) at start of session. No further questions or concerns.   Therapeutic Exercise:  Hip Matrix:   Hip Abduction    R/L: 3  x 10 - 25#    - Good ability to perform SLS on L without pain in joint.   OMEGA [employer]   Knee Extension   3 x 10 - 15#    Hamstring Curl    2 x 10 - 20#  Therapeutic Activity:   NuStep L5-2 x 5 min x LE only - > 90 SPM (Seat 8) for LE warm up, endurance and strength; PT manually adjusted resistance throughout per patient's tolerance. Adjusted to lower resistance due to minor pain.   Slider Lunges (Pillow case on floor) - Stability in knee/hip with dynamic movements  R Reverse: 2 x 8 - Two hands on tall PVC  R Lateral: 2 x 8 - Two hands on tall PVC   Forward Step Up onto 6   L: 2 x 10 - BUE support   Resisted Sit to Stands - to optimize transfers and gluteal strengthening    3 x 10 - Blue TB around thighs + 3 Kg MB in hands     PATIENT EDUCATION:  Education details: HEP, Exercise Technique  Person educated: Patient Education method: Explanation, Demonstration, and Handouts Education comprehension: verbalized understanding and returned demonstration   HOME EXERCISE PROGRAM:  Access Code: [email or web address] URL: [email or web address] Date: 08/06/2024 Prepared by: Lonni Pall  Exercises - Supine Heel Slide  - 1 x daily - 3-4 x weekly - 2-3 sets - 10-15 reps - Supine Active Straight Leg Raise  - 1 x daily - 3-4 x weekly - 2-3 sets - 8-10 reps - 5 hold - Long Sitting Quad Set with Towel Roll Under  Heel  - 1 x daily -  3-4 x weekly - 2-3 sets - 8-10 reps - Seated Long Arc Quad  - 1 x daily - 7 x weekly - 2-3 sets - 10-15 reps - 3s hold - Mini Squat  - 1 x daily - 3-4 x weekly - 2-3 sets - 8-10 reps - 5s hold - Forward Lunge with Back Leg Straight and Counter Support  - 1 x daily - 3-4 x weekly - 2-3 sets - 8-10 reps - Seated Hamstring Stretch  - 1 x daily - 7 x weekly - 3 sets - 15-30s hold - Supine Quadriceps Stretch with Strap on Table  - 1 x daily - 7 x weekly - 2-3 sets - 15-30s hold  Access Code: [email or web address] URL: [email or web address] Date: 07/16/2024 Prepared by: Lonni Pall  Exercises - Supine Heel Slide  - 1 x daily - 3-4 x weekly - 2-3 sets - 10-15 reps - Supine Active Straight Leg Raise  - 1 x daily - 3-4 x weekly - 2-3 sets - 8-10 reps - 5 hold - Long Sitting Quad Set with Towel Roll Under Heel  - 1 x daily - 3-4 x weekly - 2-3 sets - 8-10 reps - Seated Long Arc Quad  - 1 x daily - 7 x weekly - 2-3 sets - 10-15 reps - 3s hold - Mini Squat  - 1 x daily - 3-4 x weekly - 2-3 sets - 8-10 reps - 5s hold - Sidelying Quadriceps Stretch  - 1 x daily - 7 x weekly - 3 sets - 15-30 reps - Seated Hamstring Stretch  - 1 x daily - 7 x weekly - 3 sets - 15-30s hold  ASSESSMENT:  CLINICAL IMPRESSION: Continued PT POC with a focus on improving L knee pain, motor control and strength. PT primarily focused on compound activities targeting stability in LLE. Good demonstration of slider lunges as patient was able to maintain balance in lateral and reverse direction. Continued [place] strengthening in order to target thigh muscles without compression at knee joint. No adverse effects noted with all PT interventions. PT updated HEP to include additional dynamic activities. No questions at end of session. Multiple seated rest breaks provided to patient in order to mitigate MS fatigue. PT will continue to progressively increase resistance based exercises in order to improve knee stability.  Based on today's performance, patient will benefit from skilled physical therapy in order to address current deficits and maximize return to PLOF.    OBJECTIVE IMPAIRMENTS: decreased activity tolerance, decreased ROM, decreased strength, obesity, and pain.   ACTIVITY LIMITATIONS: lifting, bending, sitting, standing, squatting, and stairs  PARTICIPATION LIMITATIONS: driving and occupation  PERSONAL FACTORS: Age, Past/current experiences, Time since onset of injury/illness/exacerbation, and 3+ comorbidities: Multiple Sclerosis, Chronic Kidney Disease, Obesity, etc. are also affecting patient's functional outcome.   REHAB POTENTIAL: Good  CLINICAL DECISION MAKING: Evolving/moderate complexity  EVALUATION COMPLEXITY: Moderate   GOALS: Goals reviewed with patient? Yes  SHORT TERM GOALS: Target date: 09/03/2024  Pt will be independent with HEP to improve strength and decrease knee pain to improve pain-free function at home and work. Baseline: 07/07/2024: Initial HEP  Goal status: INITIAL   LONG TERM GOALS: Target date: 10/01/2024  Patient will be able to safely navigate a flight of 12 stairs, using proper foot placement and handrail support, without requiring assistance from PT in order to demonstrate significant improvement in LE strength and safety. Baseline: 07/07/2024: Pain with ascending/descending with LLE.  Goal status: INITIAL  2.  Pt will decrease worst knee  pain by at least 3 points on the NPRS in order to demonstrate clinically significant reduction in knee pain. Baseline: 07/07/2024: 8/10 NPS Goal status: INITIAL  3.  Pt will increase LEFS score by at least 9 points in order demonstrate clinically significant reduction in knee pain/disability.       Baseline: 07/07/2024: 28/80 = 35%  Goal status: INITIAL  4.  Pt will increase strength of knee flexion/extension by at least 1/2 MMT grade and without pain in order to demonstrate improvement in strength and function   Baseline: 07/07/2024: L Knee Flex/Ext: 4/5, 4/5* Goal status: INITIAL  5.  Pt will decrease 5TSTS by at least 3 seconds in order to demonstrate clinically significant improvement in LE strength Baseline: 07/07/2024: 16.15s  Goal status: INITIAL  6.  Pt will increase by at least 0.13 m/s in order to demonstrate clinically significant improvement in community ambulation.  Baseline: 07/07/2024: .71 m/s (Limited Tourist information centre manager)  Goal status: INITIAL   PLAN: PT FREQUENCY: 1-2x/week  PT DURATION: 8 weeks  PLANNED INTERVENTIONS: Therapeutic exercises, Therapeutic activity, Neuromuscular re-education, Balance training, Gait training, Patient/Family education, Self Care, Joint mobilization, Joint manipulation, Vestibular training, Canalith repositioning, Orthotic/Fit training, DME instructions, Dry Needling, Electrical stimulation, Spinal manipulation, Spinal mobilization, Cryotherapy, Moist heat, Taping, Traction, Manual therapy, and Re-evaluation.  PLAN FOR NEXT SESSION: Review HEP; Joint mobilization PRN, Quadricep/Hip Strengthening, Progressing functional tasks (sit to stand and stairs).    Lonni Pall PT, DPT Physical Therapist- [place]  08/06/2024, 9:01 AM

## 2024-08-10 ENCOUNTER — Ambulatory Visit

## 2024-08-12 ENCOUNTER — Ambulatory Visit

## 2024-08-12 DIAGNOSIS — M25562 Pain in left knee: Secondary | ICD-10-CM

## 2024-08-12 DIAGNOSIS — M6281 Muscle weakness (generalized): Secondary | ICD-10-CM

## 2024-08-12 DIAGNOSIS — M25662 Stiffness of left knee, not elsewhere classified: Secondary | ICD-10-CM

## 2024-08-12 NOTE — Therapy (Signed)
 OUTPATIENT PHYSICAL THERAPY KNEE TREATMENT  Patient Name: Patricia Deleon MRN: [id number] DOB:[birth year], 63 y.o., female Today's Date: 08/12/2024  END OF SESSION:  PT End of Session - 08/12/24 1352     Visit Number 6    Number of Visits 17    Date for PT Re-Evaluation 09/01/24    PT Start Time 1352    PT Stop Time 1425    PT Time Calculation (min) 33 min    Activity Tolerance Patient tolerated treatment well    Behavior During Therapy WFL for tasks assessed/performed           Past Medical History:  Diagnosis Date   Agoraphobia with panic disorder    Anemia    Depression    Erosive gastritis    Fundic gland polyps of stomach, benign    Geographic tongue    GERD (gastroesophageal reflux disease)    History of esophagogastroduodenoscopy (EGD)    Hypercholesterolemia    Hypertension    Intervertebral disc disorder    thoracic,thoracolumber,lumbosacral   Multiple sclerosis (HCC)    Obesity    Sleep apnea    Past Surgical History:  Procedure Laterality Date   COLONOSCOPY     COLONOSCOPY WITH PROPOFOL  N/A 07/07/2018   Procedure: COLONOSCOPY WITH PROPOFOL ;  Surgeon: Gaylyn Gladis PENNER, MD;  [facility];  Service: Endoscopy;  Laterality: N/A;   ESOPHAGOGASTRODUODENOSCOPY (EGD) WITH PROPOFOL  N/A 01/31/2016   Procedure: ESOPHAGOGASTRODUODENOSCOPY (EGD) WITH PROPOFOL ;  Surgeon: Gladis PENNER Gaylyn, MD;  Location: [facility] ENDOSCOPY;  Service: Endoscopy;  Laterality: N/A;   IR FLUORO GUIDE CV LINE RIGHT  10/06/2018   IR REMOVAL TUN CV CATH W/O FL  10/15/2018   IR US  GUIDE VASC ACCESS RIGHT  10/06/2018   NISSEN FUNDOPLICATION     Patient Active Problem List   Diagnosis Date Noted   Chronic kidney disease, stage 2, mildly decreased GFR 02/12/2024   Annual physical exam 02/12/2024   Prediabetes 01/15/2023   Morbid obesity (HCC) 01/15/2023   Mixed hyperlipidemia 01/15/2023   Depression, recurrent (HCC) 01/15/2023   Urinary tract infection, recurrent 07/11/2022    UTI symptoms 06/14/2022   Moderate episode of recurrent major depressive disorder (HCC) 05/28/2019   Excessive daytime sleepiness 08/25/2018   Multiple sclerosis (HCC) 07/08/2018   Neck pain 07/08/2018   Transient vision disturbance of left eye 06/18/2018   Urge incontinence 06/18/2018   Numbness 06/18/2018   Acne rosacea 05/07/2016   Gastroesophageal reflux disease without esophagitis 09/28/2015   Displacement of lumbar intervertebral disc without myelopathy 09/28/2015   Insomnia 09/28/2015   Thoracic, 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   Adiposity 09/28/2015   Restless leg 09/28/2015   Severe obstructive sleep apnea 09/28/2015   Vitamin D  deficiency 09/28/2015   Hypothyroidism 07/11/2015   Hypertension 06/09/2015    PCP: Donzella Lauraine SAILOR, DO  REFERRING PROVIDER: Donzella Lauraine SAILOR, DO  REFERRING DIAG:  [phone] (ICD-10-CM) - Acute pain of left knee  G35 (ICD-10-CM) - Multiple sclerosis (HCC)    RATIONALE FOR EVALUATION AND TREATMENT: Rehabilitation  THERAPY DIAG: Muscle weakness (generalized)  Acute pain of left knee  Stiffness of left knee, not elsewhere classified  ONSET DATE: Chronic   FOLLOW-UP APPT SCHEDULED WITH REFERRING PROVIDER: Yes August 21, 2024    SUBJECTIVE:  SUBJECTIVE STATEMENT:    Patient reporting to OPPT with a c/c of L knee pain.   PERTINENT HISTORY:   [name] is a 63 y.o. patient arriving to OPPT with L knee pain. She denies MOI or trauma to the knee. She reports that her knee will lock then pop with a relieving sensation. Additionally she reports that restricted ROM in her L knee compared to R knee. Patient reports aggravating factors: stairway navigation (both directions), sleeping at night. At night she  describes as the pain worsening as it radiates from the L knee distal into the ankle/foot and intermittently it will travel to the L hip. Relieving factors include: daily supplements, gabapentin , ice modalities.   Red flags: chills/fever, night sweats, unexplained weight gain/loss, unrelenting pain  PAIN:   Pain Intensity: Present: 0/10, Best: 0/10, Worst: 8/10 Pain location: L knee Pain quality: intermittent, aching, and throbbing  Radiating pain: Yes  Swelling: No  Popping, catching, locking: Yes  Numbness/Tingling: No Focal weakness or buckling: Yes 24-hour pain behavior: PM - Activity Dependent  History of prior back, hip, or knee injury, pain, surgery, or therapy: Yes  PRECAUTIONS: Fall, Low   WEIGHT BEARING RESTRICTIONS: No  FALLS: Has patient fallen in last 6 months? No  Living Environment Lives with: lives with their family Husband, Daughter, Son, Darden (3 yr old), 3 Dogs Lives in: House/apartment Stairs: Yes: Internal: 12-14 steps; on right going up Has following equipment at home: None  Prior level of function: Independent  Occupational demands: Works at [employer] (M-F, some Sat, 8-6)  Hobbies: None   Patient Goals: Patient would like to be able to perform stairway navigation without pain   OBJECTIVE:   Patient Surveys  LEFS: 28/80 = 35%    Cognition Cognition is within normal limits.     Gross Musculoskeletal Assessment Tremor: None Bulk: Normal Tone: Normal  GAIT: Distance walked: 89m Assistive device utilized: None Level of assistance: Complete Independence Comments: Reciprocal Gait, decreased velocity   Posture: Sitting: WNL Standing: WNL    AROM AROM (Normal range in degrees) AROM  Hip Right Left  Flexion (125) 90   Extension (15)    Abduction (40)    Adduction     Internal Rotation (45)    External Rotation (45)        Knee    Flexion (135) 110 110 *   Extension (0) 0 0      Ankle    Dorsiflexion (20)     Plantarflexion (50)    Inversion (35)    Eversion (15    (* = pain; Blank rows = not tested)  LE MMT: MMT (out of 5) Right Left  Hip flexion 4 4  Hip extension    Hip abduction (Seated) 4+ 4+  Hip adduction 5 5  Hip internal rotation    Hip external rotation    Knee flexion 4 4  Knee extension 4 4*  Ankle dorsiflexion 5 5  Ankle plantarflexion 5 5  Ankle inversion    Ankle eversion    (* = pain; Blank rows = not tested)  Sensation Grossly intact to light touch bilateral LEs as determined by testing dermatomes L2-S2. Proprioception, and hot/cold testing deferred on this date.  Reflexes R/L Knee Jerk (L3/4): 2+/2+  Ankle Jerk (S1/2): 2+/2+   Muscle Length Hamstrings: R: Not examined L: Negative Quadriceps Arvid): R: Not examined L: Not examined   Palpation Location LEFT  RIGHT  Quadriceps 0   Medial Hamstrings 0   Lateral Hamstrings 0   Lateral Hamstring tendon 0   Medial Hamstring tendon 0   Quadriceps tendon 0   Patella    Patellar Tendon    Tibial Tuberosity    Medial joint line  2*    Lateral joint line 1   MCL 1   LCL    Adductor Tubercle    Pes Anserine tendon 1   Infrapatellar fat pad    Fibular head    Popliteal fossa    (Blank rows = not tested) Graded on 0-4 scale (0 = no pain, 1 = pain, 2 = pain with wincing/grimacing/flinching, 3 = pain with withdrawal, 4 = unwilling to allow palpation), (Blank rows = not tested)  Passive Accessory Motion Deferred  VASCULAR Dorsalis pedis and posterior tibial pulses are palpable   SPECIAL TESTS  Ligamentous Stability  MCL: Valgus Stress (30 degrees flexion): R: Not examined L: Negative  LCL: Varus Stress (30 degrees flexion): R: Not examined L: Negative  Meniscus Tests McMurray's Test:  Medial Meniscus (Tibial ER): R: Not examined L: Positive for Pain with ER  Lateral Meniscus (Tibial IR): R: Not examined L: Not examined   Patellar Tendinopathy Inferior pole palpation with anterior  tilt: R: Not examined L: Negative  Functional Testing:  Squat: Deferred Sit to Stand: WNL, Reliant on UE in order to control descent Lateral Step Down: Deferred   : 13.99s 5TSTS: 16.15s   TODAY'S TREATMENT: DATE: 08/12/24  Subjective:  Patient reports that she has minor improvements with stairway navigation; she is able to ascend more stairs without needed rest break. She reports 0/10 pain in the L knee at the start of the session. She reports improvements in pain following the last PT session. No further questions or concerns.   Therapeutic Exercise:  Standing Heel Raise/Stretch on the 1st Step - BUE Support   2 x 10   Supine Quadriceps Stretch with Strap  30s/bout x 3 in order to improve ROM and tissue extensibility   - ea set with increased knee flexion, no pain reported with end range flexion  Therapeutic Activity:   NuStep L5-2 x 5 min x LE only - > 90 SPM (Seat 8) for LE warm up, endurance and strength; PT manually adjusted resistance throughout per patient's tolerance. Adjusted to lower resistance due to minor pain.   Slider Lunges (Pillow case on floor) - Stability in knee/hip with dynamic movements  R Reverse: 2 x 10 - Two hands on tall PVC  R Lateral: 2 x 10 - Two hands on tall PVC  Resisted Side Stepping  4 x 12' - Blue TB around thighs  Forward Step Up/Down   1 x 10 - BUE Support     Lateral Step Down (6 Step)    2 x 8 ea leg - BUE Support    PATIENT EDUCATION:  Education details: HEP, Exercise Technique  Person educated: Patient Education method: Explanation, Demonstration, and Handouts Education comprehension: verbalized understanding and returned demonstration   HOME EXERCISE PROGRAM:  Access Code: [email or web address] URL: [email or web address] Date: 08/06/2024 Prepared by: Lonni Pall  Exercises - Supine Heel Slide  - 1 x daily - 3-4 x weekly - 2-3 sets - 10-15 reps - Supine Active Straight Leg Raise  - 1 x daily - 3-4 x weekly -  2-3 sets - 8-10 reps - 5 hold - Long Sitting Quad Set with Towel Roll Under Heel  - 1 x daily - 3-4 x  weekly - 2-3 sets - 8-10 reps - Seated Long Arc Quad  - 1 x daily - 7 x weekly - 2-3 sets - 10-15 reps - 3s hold - Mini Squat  - 1 x daily - 3-4 x weekly - 2-3 sets - 8-10 reps - 5s hold - Forward Lunge with Back Leg Straight and Counter Support  - 1 x daily - 3-4 x weekly - 2-3 sets - 8-10 reps - Seated Hamstring Stretch  - 1 x daily - 7 x weekly - 3 sets - 15-30s hold - Supine Quadriceps Stretch with Strap on Table  - 1 x daily - 7 x weekly - 2-3 sets - 15-30s hold  Access Code: [email or web address] URL: [email or web address] Date: 07/16/2024 Prepared by: Lonni Pall  Exercises - Supine Heel Slide  - 1 x daily - 3-4 x weekly - 2-3 sets - 10-15 reps - Supine Active Straight Leg Raise  - 1 x daily - 3-4 x weekly - 2-3 sets - 8-10 reps - 5 hold - Long Sitting Quad Set with Towel Roll Under Heel  - 1 x daily - 3-4 x weekly - 2-3 sets - 8-10 reps - Seated Long Arc Quad  - 1 x daily - 7 x weekly - 2-3 sets - 10-15 reps - 3s hold - Mini Squat  - 1 x daily - 3-4 x weekly - 2-3 sets - 8-10 reps - 5s hold - Sidelying Quadriceps Stretch  - 1 x daily - 7 x weekly - 3 sets - 15-30 reps - Seated Hamstring Stretch  - 1 x daily - 7 x weekly - 3 sets - 15-30s hold  ASSESSMENT:  CLINICAL IMPRESSION: Continued PT POC with a focus on improving L knee pain, motor control and strength. PT primarily focused on compound activities targeting stability in LLE. Patient with good carryover from last session performing lateral and retro slider lunges; able to tolerate additional repetitions this appt. Other interventions targeting gluteal strength in order to improve medial/lateral knee stability. No updates to HEP this session.  Multiple seated rest breaks provided to patient in order to mitigate MS fatigue. PT will continue to progressively increase resistance based exercises in order to improve knee stability.  Based on today's performance, patient will benefit from skilled physical therapy in order to address current deficits and maximize return to PLOF.    OBJECTIVE IMPAIRMENTS: decreased activity tolerance, decreased ROM, decreased strength, obesity, and pain.   ACTIVITY LIMITATIONS: lifting, bending, sitting, standing, squatting, and stairs  PARTICIPATION LIMITATIONS: driving and occupation  PERSONAL FACTORS: Age, Past/current experiences, Time since onset of injury/illness/exacerbation, and 3+ comorbidities: Multiple Sclerosis, Chronic Kidney Disease, Obesity, etc. are also affecting patient's functional outcome.   REHAB POTENTIAL: Good  CLINICAL DECISION MAKING: Evolving/moderate complexity  EVALUATION COMPLEXITY: Moderate   GOALS: Goals reviewed with patient? Yes  SHORT TERM GOALS: Target date: 09/09/2024  Pt will be independent with HEP to improve strength and decrease knee pain to improve pain-free function at home and work. Baseline: 07/07/2024: Initial HEP  Goal status: INITIAL   LONG TERM GOALS: Target date: 10/07/2024  Patient will be able to safely navigate a flight of 12 stairs, using proper foot placement and handrail support, without requiring assistance from PT in order to demonstrate significant improvement in LE strength and safety. Baseline: 07/07/2024: Pain with ascending/descending with LLE.  Goal status: INITIAL  2.  Pt will decrease worst knee pain by at least 3 points on the NPRS in order to demonstrate clinically significant reduction  in knee pain. Baseline: 07/07/2024: 8/10 NPS Goal status: INITIAL  3.  Pt will increase LEFS score by at least 9 points in order demonstrate clinically significant reduction in knee pain/disability.       Baseline: 07/07/2024: 28/80 = 35%  Goal status: INITIAL  4.  Pt will increase strength of knee flexion/extension by at least 1/2 MMT grade and without pain in order to demonstrate improvement in strength and function   Baseline: 07/07/2024: L Knee Flex/Ext: 4/5, 4/5* Goal status: INITIAL  5.  Pt will decrease 5TSTS by at least 3 seconds in order to demonstrate clinically significant improvement in LE strength Baseline: 07/07/2024: 16.15s  Goal status: INITIAL  6.  Pt will increase by at least 0.13 m/s in order to demonstrate clinically significant improvement in community ambulation.  Baseline: 07/07/2024: .71 m/s (Limited Tourist information centre manager)  Goal status: INITIAL   PLAN: PT FREQUENCY: 1-2x/week  PT DURATION: 8 weeks  PLANNED INTERVENTIONS: Therapeutic exercises, Therapeutic activity, Neuromuscular re-education, Balance training, Gait training, Patient/Family education, Self Care, Joint mobilization, Joint manipulation, Vestibular training, Canalith repositioning, Orthotic/Fit training, DME instructions, Dry Needling, Electrical stimulation, Spinal manipulation, Spinal mobilization, Cryotherapy, Moist heat, Taping, Traction, Manual therapy, and Re-evaluation.  PLAN FOR NEXT SESSION: Review HEP; Joint mobilization PRN, Quadricep/Hip Strengthening, Progressing functional tasks (sit to stand and stairs).    Lonni Pall PT, DPT Physical Therapist- [place]  08/12/2024, 2:32 PM

## 2024-08-17 ENCOUNTER — Ambulatory Visit

## 2024-08-17 DIAGNOSIS — M25662 Stiffness of left knee, not elsewhere classified: Secondary | ICD-10-CM

## 2024-08-17 DIAGNOSIS — M6281 Muscle weakness (generalized): Secondary | ICD-10-CM | POA: Diagnosis not present

## 2024-08-17 DIAGNOSIS — M25562 Pain in left knee: Secondary | ICD-10-CM

## 2024-08-17 NOTE — Therapy (Signed)
 OUTPATIENT PHYSICAL THERAPY KNEE TREATMENT  Patient Name: Patricia Deleon MRN: [id number] DOB:06/10/1961, 63 y.o., female Today's Date: 08/17/2024  END OF SESSION:  PT End of Session - 08/17/24 1032     Visit Number 7    Number of Visits 17    Date for PT Re-Evaluation 09/01/24    PT Start Time 1031    PT Stop Time 1115    PT Time Calculation (min) 44 min    Activity Tolerance Patient tolerated treatment well    Behavior During Therapy WFL for tasks assessed/performed           Past Medical History:  Diagnosis Date   Agoraphobia with panic disorder    Anemia    Depression    Erosive gastritis    Fundic gland polyps of stomach, benign    Geographic tongue    GERD (gastroesophageal reflux disease)    History of esophagogastroduodenoscopy (EGD)    Hypercholesterolemia    Hypertension    Intervertebral disc disorder    thoracic,thoracolumber,lumbosacral   Multiple sclerosis (HCC)    Obesity    Sleep apnea    Past Surgical History:  Procedure Laterality Date   COLONOSCOPY     COLONOSCOPY WITH PROPOFOL  N/A 07/07/2018   Procedure: COLONOSCOPY WITH PROPOFOL ;  Surgeon: Gaylyn Gladis PENNER, MD;  Location: [facility] ENDOSCOPY;  Service: Endoscopy;  Laterality: N/A;   ESOPHAGOGASTRODUODENOSCOPY (EGD) WITH PROPOFOL  N/A 01/31/2016   Procedure: ESOPHAGOGASTRODUODENOSCOPY (EGD) WITH PROPOFOL ;  Surgeon: Gladis PENNER Gaylyn, MD;  Location: [facility] ENDOSCOPY;  Service: Endoscopy;  Laterality: N/A;   IR FLUORO GUIDE CV LINE RIGHT  10/06/2018   IR REMOVAL TUN CV CATH W/O FL  10/15/2018   IR US  GUIDE VASC ACCESS RIGHT  10/06/2018   NISSEN FUNDOPLICATION     Patient Active Problem List   Diagnosis Date Noted   Chronic kidney disease, stage 2, mildly decreased GFR 02/12/2024   Annual physical exam 02/12/2024   Prediabetes 01/15/2023   Morbid obesity (HCC) 01/15/2023   Mixed hyperlipidemia 01/15/2023   Depression, recurrent (HCC) 01/15/2023   Urinary tract infection, recurrent 07/11/2022    UTI symptoms 06/14/2022   Moderate episode of recurrent major depressive disorder (HCC) 05/28/2019   Excessive daytime sleepiness 08/25/2018   Multiple sclerosis (HCC) 07/08/2018   Neck pain 07/08/2018   Transient vision disturbance of left eye 06/18/2018   Urge incontinence 06/18/2018   Numbness 06/18/2018   Acne rosacea 05/07/2016   Gastroesophageal reflux disease without esophagitis 09/28/2015   Displacement of lumbar intervertebral disc without myelopathy 09/28/2015   Insomnia 09/28/2015   Thoracic, 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   Adiposity 09/28/2015   Restless leg 09/28/2015   Severe obstructive sleep apnea 09/28/2015   Vitamin D  deficiency 09/28/2015   Hypothyroidism 07/11/2015   Hypertension 06/09/2015    PCP: Donzella Lauraine SAILOR, DO  REFERRING PROVIDER: Donzella Lauraine SAILOR, DO  REFERRING DIAG:  [phone] (ICD-10-CM) - Acute pain of left knee  G35 (ICD-10-CM) - Multiple sclerosis (HCC)    RATIONALE FOR EVALUATION AND TREATMENT: Rehabilitation  THERAPY DIAG: Muscle weakness (generalized)  Acute pain of left knee  Stiffness of left knee, not elsewhere classified  ONSET DATE: Chronic   FOLLOW-UP APPT SCHEDULED WITH REFERRING PROVIDER: Yes August 21, 2024    SUBJECTIVE:  SUBJECTIVE STATEMENT:    Patient reporting to OPPT with a c/c of L knee pain.   PERTINENT HISTORY:   Patricia Deleon is a 63 y.o. patient arriving to OPPT with L knee pain. She denies MOI or trauma to the knee. She reports that her knee will lock then pop with a relieving sensation. Additionally she reports that restricted ROM in her L knee compared to R knee. Patient reports aggravating factors: stairway navigation (both directions), sleeping at night. At night she  describes as the pain worsening as it radiates from the L knee distal into the ankle/foot and intermittently it will travel to the L hip. Relieving factors include: daily supplements, gabapentin , ice modalities.   Red flags: chills/fever, night sweats, unexplained weight gain/loss, unrelenting pain  PAIN:   Pain Intensity: Present: 0/10, Best: 0/10, Worst: 8/10 Pain location: L knee Pain quality: intermittent, aching, and throbbing  Radiating pain: Yes  Swelling: No  Popping, catching, locking: Yes  Numbness/Tingling: No Focal weakness or buckling: Yes 24-hour pain behavior: PM - Activity Dependent  History of prior back, hip, or knee injury, pain, surgery, or therapy: Yes  PRECAUTIONS: Fall, Low   WEIGHT BEARING RESTRICTIONS: No  FALLS: Has patient fallen in last 6 months? No  Living Environment Lives with: lives with their family Husband, Daughter, Son, Darden (3 yr old), 3 Dogs Lives in: House/apartment Stairs: Yes: Internal: 12-14 steps; on right going up Has following equipment at home: None  Prior level of function: Independent  Occupational demands: Works at [employer] (M-F, some Sat, 8-6)  Hobbies: None   Patient Goals: Patient would like to be able to perform stairway navigation without pain   OBJECTIVE:   Patient Surveys  LEFS: 28/80 = 35%    Cognition Cognition is within normal limits.     Gross Musculoskeletal Assessment Tremor: None Bulk: Normal Tone: Normal  GAIT: Distance walked: 106m Assistive device utilized: None Level of assistance: Complete Independence Comments: Reciprocal Gait, decreased velocity   Posture: Sitting: WNL Standing: WNL    AROM AROM (Normal range in degrees) AROM  Hip Right Left  Flexion (125) 90   Extension (15)    Abduction (40)    Adduction     Internal Rotation (45)    External Rotation (45)        Knee    Flexion (135) 110 110 *   Extension (0) 0 0      Ankle    Dorsiflexion (20)     Plantarflexion (50)    Inversion (35)    Eversion (15    (* = pain; Blank rows = not tested)  LE MMT: MMT (out of 5) Right Left  Hip flexion 4 4  Hip extension    Hip abduction (Seated) 4+ 4+  Hip adduction 5 5  Hip internal rotation    Hip external rotation    Knee flexion 4 4  Knee extension 4 4*  Ankle dorsiflexion 5 5  Ankle plantarflexion 5 5  Ankle inversion    Ankle eversion    (* = pain; Blank rows = not tested)  Sensation Grossly intact to light touch bilateral LEs as determined by testing dermatomes L2-S2. Proprioception, and hot/cold testing deferred on this date.  Reflexes R/L Knee Jerk (L3/4): 2+/2+  Ankle Jerk (S1/2): 2+/2+   Muscle Length Hamstrings: R: Not examined L: Negative Quadriceps Arvid): R: Not examined L: Not examined   Palpation Location LEFT  RIGHT  Quadriceps 0   Medial Hamstrings 0   Lateral Hamstrings 0   Lateral Hamstring tendon 0   Medial Hamstring tendon 0   Quadriceps tendon 0   Patella    Patellar Tendon    Tibial Tuberosity    Medial joint line  2*    Lateral joint line 1   MCL 1   LCL    Adductor Tubercle    Pes Anserine tendon 1   Infrapatellar fat pad    Fibular head    Popliteal fossa    (Blank rows = not tested) Graded on 0-4 scale (0 = no pain, 1 = pain, 2 = pain with wincing/grimacing/flinching, 3 = pain with withdrawal, 4 = unwilling to allow palpation), (Blank rows = not tested)  Passive Accessory Motion Deferred  VASCULAR Dorsalis pedis and posterior tibial pulses are palpable   SPECIAL TESTS  Ligamentous Stability  MCL: Valgus Stress (30 degrees flexion): R: Not examined L: Negative  LCL: Varus Stress (30 degrees flexion): R: Not examined L: Negative  Meniscus Tests McMurray's Test:  Medial Meniscus (Tibial ER): R: Not examined L: Positive for Pain with ER  Lateral Meniscus (Tibial IR): R: Not examined L: Not examined   Patellar Tendinopathy Inferior pole palpation with anterior  tilt: R: Not examined L: Negative  Functional Testing:  Squat: Deferred Sit to Stand: WNL, Reliant on UE in order to control descent Lateral Step Down: Deferred   : 13.99s 5TSTS: 16.15s   TODAY'S TREATMENT: DATE: 08/17/24  Subjective:  Patient reports 0/10 NPS in the L knee. Patient reports that she was able to ascend the stairs at her house with alternating stepping pattern. She reports that she still has pain with descending stairs; heavily reliant on UE support for the outdoor steps (2)  Therapeutic Exercise:  Omega Cable Machine  Knee Extension   1 x 10 - 35#; no pain in the L Knee    2 x 10 - 30#    Hamstring Curl (pt able to transition from knee ext to hamstring curl with improved external rotation)   3 x 10 30#     Standing Heel Raise/Stretch on the 1st Step - BUE Support on stair rail   3 x 10 reps   Supine Quadriceps Stretch with Strap  30s/bout x 3 in order to improve ROM and tissue extensibility   - ea set with increased knee flexion, no pain reported with end range flexion  Therapeutic Activity:   NuStep L5-2 x 6 min x LE only - > 80 SPM (Seat 8) for LE warm up, endurance and strength; PT manually adjusted resistance throughout per patient's tolerance. Adjusted to lower resistance due to minor pain.   Slider Lunges (Pillow case on floor) - Stability in knee/hip with dynamic movements  L Lateral: 3 x 10 - Two hands on tall PVC  R Lateral: 2 x 10   TRX Squats   3 x 10 - Good depth and hip hinge mechanics   Lateral Step Down 4 Step    R/L: 2 x 8 reps ea    PATIENT EDUCATION:  Education details: HEP, Exercise Technique  Person educated: Patient Education method: Explanation, Demonstration, and Handouts Education comprehension: verbalized understanding and returned demonstration   HOME EXERCISE PROGRAM:  Access Code: [email or web address] URL: [email or web address] Date: 08/06/2024 Prepared by: Lonni Pall  Exercises - Supine Heel Slide   - 1 x daily - 3-4 x weekly - 2-3 sets - 10-15 reps - Supine Active Straight Leg Raise  -  1 x daily - 3-4 x weekly - 2-3 sets - 8-10 reps - 5 hold - Long Sitting Quad Set with Towel Roll Under Heel  - 1 x daily - 3-4 x weekly - 2-3 sets - 8-10 reps - Seated Long Arc Quad  - 1 x daily - 7 x weekly - 2-3 sets - 10-15 reps - 3s hold - Mini Squat  - 1 x daily - 3-4 x weekly - 2-3 sets - 8-10 reps - 5s hold - Forward Lunge with Back Leg Straight and Counter Support  - 1 x daily - 3-4 x weekly - 2-3 sets - 8-10 reps - Seated Hamstring Stretch  - 1 x daily - 7 x weekly - 3 sets - 15-30s hold - Supine Quadriceps Stretch with Strap on Table  - 1 x daily - 7 x weekly - 2-3 sets - 15-30s hold  Access Code: [email or web address] URL: [email or web address] Date: 07/16/2024 Prepared by: Lonni Pall  Exercises - Supine Heel Slide  - 1 x daily - 3-4 x weekly - 2-3 sets - 10-15 reps - Supine Active Straight Leg Raise  - 1 x daily - 3-4 x weekly - 2-3 sets - 8-10 reps - 5 hold - Long Sitting Quad Set with Towel Roll Under Heel  - 1 x daily - 3-4 x weekly - 2-3 sets - 8-10 reps - Seated Long Arc Quad  - 1 x daily - 7 x weekly - 2-3 sets - 10-15 reps - 3s hold - Mini Squat  - 1 x daily - 3-4 x weekly - 2-3 sets - 8-10 reps - 5s hold - Sidelying Quadriceps Stretch  - 1 x daily - 7 x weekly - 3 sets - 15-30 reps - Seated Hamstring Stretch  - 1 x daily - 7 x weekly - 3 sets - 15-30s hold  ASSESSMENT:  CLINICAL IMPRESSION: Continued PT POC with a focus on improving L knee pain, motor control and strength. PT primarily focused on compound activities targeting stability in LLE. Good demonstration of UE supported squat with TRX ropes. She can consistently perform R slider lunges with UE support without sensation of instability or knee pain indicating improved CKC stability. Additionally she tolerated increase in LE exercises indicating improved LE muscular endurance however multiple seated rest breaks provided to  patient in order to mitigate MS fatigue. PT will continue to progressively increase resistance based exercises in order to improve knee stability. Based on today's performance, patient will benefit from skilled physical therapy in order to address current deficits and maximize return to PLOF.    OBJECTIVE IMPAIRMENTS: decreased activity tolerance, decreased ROM, decreased strength, obesity, and pain.   ACTIVITY LIMITATIONS: lifting, bending, sitting, standing, squatting, and stairs  PARTICIPATION LIMITATIONS: driving and occupation  PERSONAL FACTORS: Age, Past/current experiences, Time since onset of injury/illness/exacerbation, and 3+ comorbidities: Multiple Sclerosis, Chronic Kidney Disease, Obesity, etc. are also affecting patient's functional outcome.   REHAB POTENTIAL: Good  CLINICAL DECISION MAKING: Evolving/moderate complexity  EVALUATION COMPLEXITY: Moderate   GOALS: Goals reviewed with patient? Yes  SHORT TERM GOALS: Target date: 09/14/2024  Pt will be independent with HEP to improve strength and decrease knee pain to improve pain-free function at home and work. Baseline: 07/07/2024: Initial HEP  Goal status: INITIAL   LONG TERM GOALS: Target date: 10/12/2024  Patient will be able to safely navigate a flight of 12 stairs, using proper foot placement and handrail support, without requiring assistance from PT in order to demonstrate significant improvement in LE  strength and safety. Baseline: 07/07/2024: Pain with ascending/descending with LLE.  Goal status: INITIAL  2.  Pt will decrease worst knee pain by at least 3 points on the NPRS in order to demonstrate clinically significant reduction in knee pain. Baseline: 07/07/2024: 8/10 NPS Goal status: INITIAL  3.  Pt will increase LEFS score by at least 9 points in order demonstrate clinically significant reduction in knee pain/disability.       Baseline: 07/07/2024: 28/80 = 35%  Goal status: INITIAL  4.  Pt will increase  strength of knee flexion/extension by at least 1/2 MMT grade and without pain in order to demonstrate improvement in strength and function  Baseline: 07/07/2024: L Knee Flex/Ext: 4/5, 4/5* Goal status: INITIAL  5.  Pt will decrease 5TSTS by at least 3 seconds in order to demonstrate clinically significant improvement in LE strength Baseline: 07/07/2024: 16.15s  Goal status: INITIAL  6.  Pt will increase by at least 0.13 m/s in order to demonstrate clinically significant improvement in community ambulation.  Baseline: 07/07/2024: .71 m/s (Limited Tourist information centre manager)  Goal status: INITIAL   PLAN: PT FREQUENCY: 1-2x/week  PT DURATION: 8 weeks  PLANNED INTERVENTIONS: Therapeutic exercises, Therapeutic activity, Neuromuscular re-education, Balance training, Gait training, Patient/Family education, Self Care, Joint mobilization, Joint manipulation, Vestibular training, Canalith repositioning, Orthotic/Fit training, DME instructions, Dry Needling, Electrical stimulation, Spinal manipulation, Spinal mobilization, Cryotherapy, Moist heat, Taping, Traction, Manual therapy, and Re-evaluation.  PLAN FOR NEXT SESSION: Review HEP; Joint mobilization PRN, Quadricep/Hip Strengthening, Progressing functional tasks (sit to stand and stairs).    Lonni Pall PT, DPT Physical Therapist- [place]  08/17/2024, 10:34 AM

## 2024-08-20 ENCOUNTER — Ambulatory Visit

## 2024-08-21 ENCOUNTER — Ambulatory Visit: Admitting: Family Medicine

## 2024-08-21 ENCOUNTER — Encounter: Payer: Self-pay | Admitting: Family Medicine

## 2024-08-21 VITALS — BP 133/58 | HR 79 | Temp 98.6°F | Ht 66.0 in | Wt 322.6 lb

## 2024-08-21 DIAGNOSIS — J029 Acute pharyngitis, unspecified: Secondary | ICD-10-CM

## 2024-08-21 DIAGNOSIS — Z23 Encounter for immunization: Secondary | ICD-10-CM

## 2024-08-21 DIAGNOSIS — M25562 Pain in left knee: Secondary | ICD-10-CM | POA: Diagnosis not present

## 2024-08-21 DIAGNOSIS — G4733 Obstructive sleep apnea (adult) (pediatric): Secondary | ICD-10-CM | POA: Diagnosis not present

## 2024-08-21 DIAGNOSIS — Z713 Dietary counseling and surveillance: Secondary | ICD-10-CM

## 2024-08-21 DIAGNOSIS — G35 Multiple sclerosis: Secondary | ICD-10-CM

## 2024-08-21 DIAGNOSIS — Z6841 Body Mass Index (BMI) 40.0 and over, adult: Secondary | ICD-10-CM

## 2024-08-21 MED ORDER — NALTREXONE HCL 50 MG PO TABS
25.0000 mg | ORAL_TABLET | Freq: Every day | ORAL | 2 refills | Status: DC
Start: 2024-08-21 — End: 2024-11-20

## 2024-08-21 NOTE — Progress Notes (Signed)
 Established patient visit   Patient: Patricia Deleon   DOB: 07-14-1961   63 y.o. Female  MRN: [id number] Visit Date: 08/21/2024  Today's healthcare provider: LAURAINE LOISE BUOY, DO   Chief Complaint  Patient presents with   Weight Check    Patient reports she is here today for a weight check as well as her left knee.  She is currently in physical therapy, states that it is working and awesome.  Stated that she is walking like a human being but still can't do the stairs.  Shingles Vaccine-yes   Subjective    HPI Patricia Deleon is a 63 year old female with sleep apnea who presents for follow-up regarding her weight.  She has been experiencing a sore throat that initially moved from one side to the other and has now settled on one side. The sore throat is described as unusual compared to typical sore throats. Recently, she has developed a nocturnal cough that wakes her up at night. No allergies are reported, but there is some postnasal drip and drainage.  She has a long-standing history of sleep apnea, diagnosed approximately 25 years ago. Although she has not had a recent sleep study, she uses a CPAP machine, which she finds beneficial for her sleep. Her current CPAP machine is out of warranty, and she mentions needing new supplies. She wakes up every two hours to urinate, which disrupts her sleep.  She knows she will need a new sleep study and would like the order sent to [facility] in [place].  She has been exploring weight loss medications but faces challenges with insurance coverage. Her insurance does not cover weight loss medications, and she has not started any new medication. She has previously tried metformin  without success. She struggles with eating unhealthy foods due to her work schedule. She now has a meal plan that provides five prepared meals weekly to help manage her diet, which she will be starting soon.  She has been doing physical therapy for her left  knee, which she notes is helping.  She is able to carefully walk up stairs slowly but is still unable to walk downstairs.  She is currently taking venlafaxine  and glucosamine for her knee. She has a history of MS. She has previously tried Mega Red fish oil despite an allergy to shellfish, noting no adverse reactions.  She has completed one dose of the shingles vaccine and plans to receive the second dose. She has declined the COVID vaccine and plans to receive the pneumonia vaccine at a later date.        Medications: Outpatient Medications Prior to Visit  Medication Sig   ARIPiprazole  (ABILIFY ) 10 MG tablet Take 1 tablet (10 mg total) by mouth daily.   Armodafinil  250 MG tablet Take 1 tablet (250 mg total) by mouth daily.   aspirin 81 MG chewable tablet Chew 81 mg by mouth daily.   Cholecalciferol (VITAMIN D3) 2000 UNITS capsule Take 4,000 Units by mouth daily.    Ferrous Sulfate Dried (HM SLOW RELEASE IRON) 45 MG TBCR Take by mouth.   gabapentin  (NEURONTIN ) 100 MG capsule Take 1 capsule (100 mg total) by mouth 2 (two) times daily.   Glatiramer Acetate 40 MG/ML SOSY Inject into the skin.   ketoconazole  (NIZORAL ) 2 % cream Apply 1 application topically daily.   ketoconazole  (NIZORAL ) 2 % shampoo APPLY 1 APPLICATION TOPICALLY 2 (TWO) TIMES A WEEK.   levothyroxine  (SYNTHROID ) 125 MCG tablet Take 1 tablet (  125 mcg total) by mouth daily.   losartan -hydrochlorothiazide (HYZAAR) 100-25 MG tablet Take 1 tablet by mouth daily.   metFORMIN  (GLUCOPHAGE -XR) 750 MG 24 hr tablet Take 1 tablet (750 mg total) by mouth in the morning and at bedtime.   mirabegron  ER (MYRBETRIQ ) 50 MG TB24 tablet Take 1 tablet (50 mg total) by mouth daily.   Omega-3 Fatty Acids (FISH OIL) 500 MG CAPS Take by mouth.   pantoprazole  (PROTONIX ) 40 MG tablet Take 1 tablet (40 mg total) by mouth daily.   polyethylene glycol powder (GLYCOLAX/MIRALAX) 17 GM/SCOOP powder Take 17 g by mouth at bedtime.    rosuvastatin  (CRESTOR )  20 MG tablet Take 1 tablet (20 mg total) by mouth daily.   tirzepatide  (ZEPBOUND ) 2.5 MG/0.5ML injection vial Inject 2.5 mg into the skin once a week.   traZODone  (DESYREL ) 300 MG tablet Take 1 tablet (300 mg total) by mouth at bedtime.   venlafaxine  XR (EFFEXOR -XR) 150 MG 24 hr capsule TAKE 2 CAPSULES (300 MG TOTAL) BY MOUTH DAILY WITH BREAKFAST   Vitamin D , Ergocalciferol , (DRISDOL ) 1.25 MG (50000 UNIT) CAPS capsule Take 1 capsule (50,000 Units total) by mouth every 7 (seven) days. AFTER finishing this bottle, start OTC vitamin D  2000 units daily   [DISCONTINUED] vitamin C (ASCORBIC ACID) 500 MG tablet Take 500 mg by mouth daily.   No facility-administered medications prior to visit.        Objective    BP (!) 133/58 (BP Location: Left Arm, Patient Position: Sitting, Cuff Size: Large)   Pulse 79   Temp 98.6 F (37 C) (Oral)   Ht 5' 6 (1.676 m)   Wt (!) 322 lb 9.6 oz (146.3 kg)   LMP 08/06/2018   SpO2 98%   BMI 52.07 kg/m     Physical Exam Vitals and nursing note reviewed.  Constitutional:      General: She is not in acute distress.    Appearance: Normal appearance.  HENT:     Head: Normocephalic and atraumatic.  Eyes:     General: No scleral icterus.    Conjunctiva/sclera: Conjunctivae normal.  Cardiovascular:     Rate and Rhythm: Normal rate.  Pulmonary:     Effort: Pulmonary effort is normal.  Neurological:     Mental Status: She is alert and oriented to person, place, and time. Mental status is at baseline.  Psychiatric:        Mood and Affect: Mood normal.        Behavior: Behavior normal.      No results found for any visits on 08/21/24.  Assessment & Plan    Severe obstructive sleep apnea -     Ambulatory referral to Sleep Studies  Morbid obesity with BMI of 50.0-59.9, adult (HCC) -     Naltrexone  HCl; Take 0.5-1 tablets (25-50 mg total) by mouth daily.  Dispense: 30 tablet; Refill: 2  Weight loss counseling, encounter for -     Naltrexone  HCl; Take  0.5-1 tablets (25-50 mg total) by mouth daily.  Dispense: 30 tablet; Refill: 2  Acute pain of left knee  Multiple sclerosis (HCC)  Pharyngitis, unspecified etiology  Need for shingles vaccine -     Varicella-zoster vaccine IM      Severe obstructive sleep apnea Long-standing severe obstructive sleep apnea with frequent nocturnal awakenings. Current CPAP machine is out of warranty.  She does use her current CPAP consistently and derives benefit from it. - Order sleep study at [facility] in [place]. - Plan to send  prescription for Zepbound  under the indication of sleep apnea when new sleep study is available. - Order new CPAP machine and supplies after sleep study results.  Morbid obesity (BMI 50.0-59.9); weight loss counseling Obesity management complicated by insurance not covering weight loss medications. Previous metformin  attempts unsuccessful. Naltrexone  chosen for appetite suppression due to concerns about fatigue with topiramate. - Prescribe naltrexone  for appetite suppression. - Advise starting with a half tablet and increase to a full tablet as tolerated. - Consider Zepbound  if insurance approves under sleep apnea indication.  Left knee pain with functional limitation Knee pain improved with physical therapy. Difficulty with stairs, particularly descending. - Continue physical therapy. - Encourage adherence to PT sessions.  Multiple sclerosis Patient suffers from chronic multiple sclerosis.  She takes armodafinil  to manage associated daytime sleepiness.  Acute pharyngitis with postnasal drip Recent mild, inconsistent sore throat and cough, possibly related to allergic rhinitis and postnasal drip.  Discussed that allergy medication, such as Claritin, Zyrtec or Allegra, may help with this.  General Health Maintenance Discussed vaccinations and general health maintenance. Plans to receive shingles and pneumonia vaccines. Declined COVID vaccine and plans to receive flu  vaccine at work. - Administer second dose of shingles vaccine. - Plan for pneumonia vaccine at next visit. - Decline COVID vaccine. - Receive flu vaccine at work.     Return in about 3 months (around 11/21/2024) for Weight.      I discussed the assessment and treatment plan with the patient  The patient was provided an opportunity to ask questions and all were answered. The patient agreed with the plan and demonstrated an understanding of the instructions.   The patient was advised to call back or seek an in-person evaluation if the symptoms worsen or if the condition fails to improve as anticipated.    LAURAINE LOISE BUOY, DO  [facility] [phone] (phone) [phone] (fax)  [facility]

## 2024-08-25 ENCOUNTER — Ambulatory Visit

## 2024-08-25 DIAGNOSIS — M25562 Pain in left knee: Secondary | ICD-10-CM

## 2024-08-25 DIAGNOSIS — M6281 Muscle weakness (generalized): Secondary | ICD-10-CM | POA: Diagnosis not present

## 2024-08-25 DIAGNOSIS — M25662 Stiffness of left knee, not elsewhere classified: Secondary | ICD-10-CM

## 2024-08-25 NOTE — Therapy (Signed)
 OUTPATIENT PHYSICAL THERAPY KNEE TREATMENT  Patient Name: Patricia Deleon MRN: [id number] DOB:04/16/1961, 63 y.o., female Today's Date: 08/25/2024  END OF SESSION:  PT End of Session - 08/25/24 1000     Visit Number 8    Number of Visits 17    Date for PT Re-Evaluation 09/01/24    PT Start Time 0950    PT Stop Time 1030    PT Time Calculation (min) 40 min    Activity Tolerance Patient tolerated treatment well    Behavior During Therapy WFL for tasks assessed/performed            Past Medical History:  Diagnosis Date   Agoraphobia with panic disorder    Anemia    Depression    Erosive gastritis    Fundic gland polyps of stomach, benign    Geographic tongue    GERD (gastroesophageal reflux disease)    History of esophagogastroduodenoscopy (EGD)    Hypercholesterolemia    Hypertension    Intervertebral disc disorder    thoracic,thoracolumber,lumbosacral   Multiple sclerosis (HCC)    Obesity    Sleep apnea    Past Surgical History:  Procedure Laterality Date   COLONOSCOPY     COLONOSCOPY WITH PROPOFOL  N/A 07/07/2018   Procedure: COLONOSCOPY WITH PROPOFOL ;  Surgeon: Patricia Patricia PENNER, MD;  Location: [facility] ENDOSCOPY;  Service: Endoscopy;  Laterality: N/A;   ESOPHAGOGASTRODUODENOSCOPY (EGD) WITH PROPOFOL  N/A 01/31/2016   Procedure: ESOPHAGOGASTRODUODENOSCOPY (EGD) WITH PROPOFOL ;  Surgeon: Patricia Deleon Gaylyn, MD;  Location: [facility] ENDOSCOPY;  Service: Endoscopy;  Laterality: N/A;   IR FLUORO GUIDE CV LINE RIGHT  10/06/2018   IR REMOVAL TUN CV CATH W/O FL  10/15/2018   IR US  GUIDE VASC ACCESS RIGHT  10/06/2018   NISSEN FUNDOPLICATION     Patient Active Problem List   Diagnosis Date Noted   Chronic kidney disease, stage 2, mildly decreased GFR 02/12/2024   Annual physical exam 02/12/2024   Prediabetes 01/15/2023   Morbid obesity (HCC) 01/15/2023   Mixed hyperlipidemia 01/15/2023   Depression, recurrent (HCC) 01/15/2023   Urinary tract infection, recurrent 07/11/2022    UTI symptoms 06/14/2022   Moderate episode of recurrent major depressive disorder (HCC) 05/28/2019   Excessive daytime sleepiness 08/25/2018   Multiple sclerosis (HCC) 07/08/2018   Neck pain 07/08/2018   Transient vision disturbance of left eye 06/18/2018   Urge incontinence 06/18/2018   Numbness 06/18/2018   Acne rosacea 05/07/2016   Gastroesophageal reflux disease without esophagitis 09/28/2015   Displacement of lumbar intervertebral disc without myelopathy 09/28/2015   Insomnia 09/28/2015   Thoracic, 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   Adiposity 09/28/2015   Restless leg 09/28/2015   Severe obstructive sleep apnea 09/28/2015   Vitamin D  deficiency 09/28/2015   Hypothyroidism 07/11/2015   Hypertension 06/09/2015    PCP: Patricia Lauraine SAILOR, DO  REFERRING PROVIDER: Donzella Lauraine SAILOR, DO  REFERRING DIAG:  [phone] (ICD-10-CM) - Acute pain of left knee  G35 (ICD-10-CM) - Multiple sclerosis (HCC)    RATIONALE FOR EVALUATION AND TREATMENT: Rehabilitation  THERAPY DIAG: Muscle weakness (generalized)  Acute pain of left knee  Stiffness of left knee, not elsewhere classified  ONSET DATE: Chronic   FOLLOW-UP APPT SCHEDULED WITH REFERRING PROVIDER: Yes August 21, 2024    SUBJECTIVE:  SUBJECTIVE STATEMENT:    Patient reporting to OPPT with a c/c of L knee pain.   PERTINENT HISTORY:   [name] is a 63 y.o. patient arriving to OPPT with L knee pain. She denies MOI or trauma to the knee. She reports that her knee will lock then pop with a relieving sensation. Additionally she reports that restricted ROM in her L knee compared to R knee. Patient reports aggravating factors: stairway navigation (both directions), sleeping at night. At night she  describes as the pain worsening as it radiates from the L knee distal into the ankle/foot and intermittently it will travel to the L hip. Relieving factors include: daily supplements, gabapentin , ice modalities.   Red flags: chills/fever, night sweats, unexplained weight gain/loss, unrelenting pain  PAIN:   Pain Intensity: Present: 0/10, Best: 0/10, Worst: 8/10 Pain location: L knee Pain quality: intermittent, aching, and throbbing  Radiating pain: Yes  Swelling: No  Popping, catching, locking: Yes  Numbness/Tingling: No Focal weakness or buckling: Yes 24-hour pain behavior: PM - Activity Dependent  History of prior back, hip, or knee injury, pain, surgery, or therapy: Yes  PRECAUTIONS: Fall, Low   WEIGHT BEARING RESTRICTIONS: No  FALLS: Has patient fallen in last 6 months? No  Living Environment Lives with: lives with their family Husband, Daughter, Son, Darden (3 yr old), 3 Dogs Lives in: House/apartment Stairs: Yes: Internal: 12-14 steps; on right going up Has following equipment at home: None  Prior level of function: Independent  Occupational demands: Works at [employer] (M-F, some Sat, 8-6)  Hobbies: None   Patient Goals: Patient would like to be able to perform stairway navigation without pain   OBJECTIVE:   Patient Surveys  LEFS: 28/80 = 35%    Cognition Cognition is within normal limits.     Gross Musculoskeletal Assessment Tremor: None Bulk: Normal Tone: Normal  GAIT: Distance walked: 9m Assistive device utilized: None Level of assistance: Complete Independence Comments: Reciprocal Gait, decreased velocity   Posture: Sitting: WNL Standing: WNL    AROM AROM (Normal range in degrees) AROM  Hip Right Left  Flexion (125) 90   Extension (15)    Abduction (40)    Adduction     Internal Rotation (45)    External Rotation (45)        Knee    Flexion (135) 110 110 *   Extension (0) 0 0      Ankle    Dorsiflexion (20)     Plantarflexion (50)    Inversion (35)    Eversion (15    (* = pain; Blank rows = not tested)  LE MMT: MMT (out of 5) Right Left  Hip flexion 4 4  Hip extension    Hip abduction (Seated) 4+ 4+  Hip adduction 5 5  Hip internal rotation    Hip external rotation    Knee flexion 4 4  Knee extension 4 4*  Ankle dorsiflexion 5 5  Ankle plantarflexion 5 5  Ankle inversion    Ankle eversion    (* = pain; Blank rows = not tested)  Sensation Grossly intact to light touch bilateral LEs as determined by testing dermatomes L2-S2. Proprioception, and hot/cold testing deferred on this date.  Reflexes R/L Knee Jerk (L3/4): 2+/2+  Ankle Jerk (S1/2): 2+/2+   Muscle Length Hamstrings: R: Not examined L: Negative Quadriceps Arvid): R: Not examined L: Not examined   Palpation Location LEFT  RIGHT  Quadriceps 0   Medial Hamstrings 0   Lateral Hamstrings 0   Lateral Hamstring tendon 0   Medial Hamstring tendon 0   Quadriceps tendon 0   Patella    Patellar Tendon    Tibial Tuberosity    Medial joint line  2*    Lateral joint line 1   MCL 1   LCL    Adductor Tubercle    Pes Anserine tendon 1   Infrapatellar fat pad    Fibular head    Popliteal fossa    (Blank rows = not tested) Graded on 0-4 scale (0 = no pain, 1 = pain, 2 = pain with wincing/grimacing/flinching, 3 = pain with withdrawal, 4 = unwilling to allow palpation), (Blank rows = not tested)  Passive Accessory Motion Deferred  VASCULAR Dorsalis pedis and posterior tibial pulses are palpable   SPECIAL TESTS  Ligamentous Stability  MCL: Valgus Stress (30 degrees flexion): R: Not examined L: Negative  LCL: Varus Stress (30 degrees flexion): R: Not examined L: Negative  Meniscus Tests McMurray's Test:  Medial Meniscus (Tibial ER): R: Not examined L: Positive for Pain with ER  Lateral Meniscus (Tibial IR): R: Not examined L: Not examined   Patellar Tendinopathy Inferior pole palpation with anterior  tilt: R: Not examined L: Negative  Functional Testing:  Squat: Deferred Sit to Stand: WNL, Reliant on UE in order to control descent Lateral Step Down: Deferred   : 13.99s 5TSTS: 16.15s   TODAY'S TREATMENT: DATE: 08/25/24  Subjective: Patient reports 0/10 NPS in the L knee. She reports being on a one week vacation from work; less stress and knee pain endorsed. She reports that she has been able to navigate ascending the stairs better however descending the stairs have remained difficult. Additionally she has a hard time transferring from couch due to seat height (too low) and required assistance from family member in order to stand. No further questions or concerns.  Therapeutic Exercise:  Omega Cable Machine  Knee Extension   1 x 10 - 15#   2 x 10 - 25#    Hamstring Curl   3 x 10 - 35#     Therapeutic Activity:   NuStep L6-2 x 6 min x LE only - > 80 SPM (Seat 8) for LE, endurance and strength; PT manually adjusted resistance throughout per patient's tolerance.    Forward Step Down - SUE Support    R/L: 2 x 10   TRX Squats for squatting   3 x 10 - Good depth and hip hinge mechanics   Resisted Sit To Stand - Elevated Mat Table (19) to optimize transferring from couch   1 x 10 - 3 Kg med ball in hands   2 x 10 - 15 Lb med ball    Standing Heel Raises for increase push off with stairs and gait   3 x 15 reps  PT education on performing proper trunk lean and sit to stand transfer practice at home in order to increase ability to perform STS from couch.   PATIENT EDUCATION:  Education details: HEP, Exercise Technique  Person educated: Patient Education method: Explanation, Demonstration, and Handouts Education comprehension: verbalized understanding and returned demonstration   HOME EXERCISE PROGRAM:  Access Code: [email or web address] URL: [email or web address] Date: 08/25/2024 Prepared by: Lonni Pall  Exercises - Supine Heel Slide  - 1 x daily - 3-4 x  weekly - 2-3 sets - 10-15 reps - Supine Active Straight Leg Raise  - 1 x daily -  3-4 x weekly - 2-3 sets - 8-10 reps - 5 hold - Long Sitting Quad Set with Towel Roll Under Heel  - 1 x daily - 3-4 x weekly - 2-3 sets - 8-10 reps - Seated Long Arc Quad  - 1 x daily - 7 x weekly - 2-3 sets - 10-15 reps - 3s hold - Mini Squat  - 1 x daily - 3-4 x weekly - 2-3 sets - 8-10 reps - 5s hold - Forward Lunge with Back Leg Straight and Counter Support  - 1 x daily - 3-4 x weekly - 2-3 sets - 8-10 reps - Seated Hamstring Stretch  - 1 x daily - 7 x weekly - 3 sets - 15-30s hold - Supine Quadriceps Stretch with Strap on Table  - 1 x daily - 7 x weekly - 2-3 sets - 15-30s hold - Heel Raises with Counter Support  - 1 x daily - 7 x weekly - 2-3 sets - 10 reps - Forward Step Down with Counter Support at Side  - 1 x daily - 7 x weekly - 2-3 sets - 8-10 reps  Access Code: [email or web address] URL: [email or web address] Date: 08/06/2024 Prepared by: Lonni Pall  Exercises - Supine Heel Slide  - 1 x daily - 3-4 x weekly - 2-3 sets - 10-15 reps - Supine Active Straight Leg Raise  - 1 x daily - 3-4 x weekly - 2-3 sets - 8-10 reps - 5 hold - Long Sitting Quad Set with Towel Roll Under Heel  - 1 x daily - 3-4 x weekly - 2-3 sets - 8-10 reps - Seated Long Arc Quad  - 1 x daily - 7 x weekly - 2-3 sets - 10-15 reps - 3s hold - Mini Squat  - 1 x daily - 3-4 x weekly - 2-3 sets - 8-10 reps - 5s hold - Forward Lunge with Back Leg Straight and Counter Support  - 1 x daily - 3-4 x weekly - 2-3 sets - 8-10 reps - Seated Hamstring Stretch  - 1 x daily - 7 x weekly - 3 sets - 15-30s hold - Supine Quadriceps Stretch with Strap on Table  - 1 x daily - 7 x weekly - 2-3 sets - 15-30s hold   ASSESSMENT:  CLINICAL IMPRESSION: Continued PT POC with a focus on improving L knee pain and strength. Good tolerance to all PT interventions without pain in the L knee. Descending from 4 StepUP with some FAB and minor pain after  multiple repetitions. Her pain continues to respond with PT interventions however her knee pain limits her capacity to descend stairs. PT to continue focusing on L knee stability and eccentric control in quadriceps group in order to improve stairway capacity. Currently her pain and deficits limit her full participation with recreational tasks/work related tasks. Based on today's performance, patient will benefit from skilled physical therapy in order to address current deficits and maximize return to PLOF.    OBJECTIVE IMPAIRMENTS: decreased activity tolerance, decreased ROM, decreased strength, obesity, and pain.   ACTIVITY LIMITATIONS: lifting, bending, sitting, standing, squatting, and stairs  PARTICIPATION LIMITATIONS: driving and occupation  PERSONAL FACTORS: Age, Past/current experiences, Time since onset of injury/illness/exacerbation, and 3+ comorbidities: Multiple Sclerosis, Chronic Kidney Disease, Obesity, etc. are also affecting patient's functional outcome.   REHAB POTENTIAL: Good  CLINICAL DECISION MAKING: Evolving/moderate complexity  EVALUATION COMPLEXITY: Moderate   GOALS: Goals reviewed with patient? Yes  SHORT TERM GOALS: Target date: 09/22/2024  Pt will be independent with  HEP to improve strength and decrease knee pain to improve pain-free function at home and work. Baseline: 07/07/2024: Initial HEP  Goal status: INITIAL   LONG TERM GOALS: Target date: 10/20/2024  Patient will be able to safely navigate a flight of 12 stairs, using proper foot placement and handrail support, without requiring assistance from PT in order to demonstrate significant improvement in LE strength and safety. Baseline: 07/07/2024: Pain with ascending/descending with LLE.  Goal status: INITIAL  2.  Pt will decrease worst knee pain by at least 3 points on the NPRS in order to demonstrate clinically significant reduction in knee pain. Baseline: 07/07/2024: 8/10 NPS Goal status: INITIAL  3.   Pt will increase LEFS score by at least 9 points in order demonstrate clinically significant reduction in knee pain/disability.       Baseline: 07/07/2024: 28/80 = 35%  Goal status: INITIAL  4.  Pt will increase strength of knee flexion/extension by at least 1/2 MMT grade and without pain in order to demonstrate improvement in strength and function  Baseline: 07/07/2024: L Knee Flex/Ext: 4/5, 4/5* Goal status: INITIAL  5.  Pt will decrease 5TSTS by at least 3 seconds in order to demonstrate clinically significant improvement in LE strength Baseline: 07/07/2024: 16.15s  Goal status: INITIAL  6.  Pt will increase by at least 0.13 m/s in order to demonstrate clinically significant improvement in community ambulation.  Baseline: 07/07/2024: .71 m/s (Limited Tourist information centre manager)  Goal status: INITIAL   PLAN: PT FREQUENCY: 1-2x/week  PT DURATION: 8 weeks  PLANNED INTERVENTIONS: Therapeutic exercises, Therapeutic activity, Neuromuscular re-education, Balance training, Gait training, Patient/Family education, Self Care, Joint mobilization, Joint manipulation, Vestibular training, Canalith repositioning, Orthotic/Fit training, DME instructions, Dry Needling, Electrical stimulation, Spinal manipulation, Spinal mobilization, Cryotherapy, Moist heat, Taping, Traction, Manual therapy, and Re-evaluation.  PLAN FOR NEXT SESSION: Review HEP; Joint mobilization PRN, Quadricep/Hip Strengthening, Progressing functional tasks (sit to stand and stairs).   Lonni Pall PT, DPT Physical Therapist- [place]  08/25/2024, 10:02 AM

## 2024-08-27 ENCOUNTER — Ambulatory Visit

## 2024-08-27 DIAGNOSIS — M6281 Muscle weakness (generalized): Secondary | ICD-10-CM | POA: Diagnosis not present

## 2024-08-27 DIAGNOSIS — M25662 Stiffness of left knee, not elsewhere classified: Secondary | ICD-10-CM

## 2024-08-27 DIAGNOSIS — M25562 Pain in left knee: Secondary | ICD-10-CM

## 2024-08-27 NOTE — Therapy (Signed)
 OUTPATIENT PHYSICAL THERAPY KNEE TREATMENT  Patient Name: Patricia Deleon MRN: [id number] DOB:1961/12/28, 63 y.o., female Today's Date: 08/27/2024  END OF SESSION:  PT End of Session - 08/27/24 0952     Visit Number 9    Number of Visits 17    Date for PT Re-Evaluation 09/01/24    PT Start Time 0950    PT Stop Time 1030    PT Time Calculation (min) 40 min    Activity Tolerance Patient tolerated treatment well    Behavior During Therapy WFL for tasks assessed/performed            Past Medical History:  Diagnosis Date   Agoraphobia with panic disorder    Anemia    Depression    Erosive gastritis    Fundic gland polyps of stomach, benign    Geographic tongue    GERD (gastroesophageal reflux disease)    History of esophagogastroduodenoscopy (EGD)    Hypercholesterolemia    Hypertension    Intervertebral disc disorder    thoracic,thoracolumber,lumbosacral   Multiple sclerosis (HCC)    Obesity    Sleep apnea    Past Surgical History:  Procedure Laterality Date   COLONOSCOPY     COLONOSCOPY WITH PROPOFOL  N/A 07/07/2018   Procedure: COLONOSCOPY WITH PROPOFOL ;  Surgeon: Patricia Gladis PENNER, MD;  Location: [facility] ENDOSCOPY;  Service: Endoscopy;  Laterality: N/A;   ESOPHAGOGASTRODUODENOSCOPY (EGD) WITH PROPOFOL  N/A 01/31/2016   Procedure: ESOPHAGOGASTRODUODENOSCOPY (EGD) WITH PROPOFOL ;  Surgeon: Gladis Deleon Gaylyn, MD;  Location: [facility] ENDOSCOPY;  Service: Endoscopy;  Laterality: N/A;   IR FLUORO GUIDE CV LINE RIGHT  10/06/2018   IR REMOVAL TUN CV CATH W/O FL  10/15/2018   IR US  GUIDE VASC ACCESS RIGHT  10/06/2018   NISSEN FUNDOPLICATION     Patient Active Problem List   Diagnosis Date Noted   Chronic kidney disease, stage 2, mildly decreased GFR 02/12/2024   Annual physical exam 02/12/2024   Prediabetes 01/15/2023   Morbid obesity (HCC) 01/15/2023   Mixed hyperlipidemia 01/15/2023   Depression, recurrent (HCC) 01/15/2023   Urinary tract infection, recurrent 07/11/2022    UTI symptoms 06/14/2022   Moderate episode of recurrent major depressive disorder (HCC) 05/28/2019   Excessive daytime sleepiness 08/25/2018   Multiple sclerosis (HCC) 07/08/2018   Neck pain 07/08/2018   Transient vision disturbance of left eye 06/18/2018   Urge incontinence 06/18/2018   Numbness 06/18/2018   Acne rosacea 05/07/2016   Gastroesophageal reflux disease without esophagitis 09/28/2015   Displacement of lumbar intervertebral disc without myelopathy 09/28/2015   Insomnia 09/28/2015   Thoracic, 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   Adiposity 09/28/2015   Restless leg 09/28/2015   Severe obstructive sleep apnea 09/28/2015   Vitamin D  deficiency 09/28/2015   Hypothyroidism 07/11/2015   Hypertension 06/09/2015    PCP: Patricia Lauraine SAILOR, DO  REFERRING PROVIDER: Donzella Lauraine SAILOR, DO  REFERRING DIAG:  [id number] (ICD-10-CM) - Acute pain of left knee  G35 (ICD-10-CM) - Multiple sclerosis (HCC)    RATIONALE FOR EVALUATION AND TREATMENT: Rehabilitation  THERAPY DIAG: Muscle weakness (generalized)  Acute pain of left knee  Stiffness of left knee, not elsewhere classified  ONSET DATE: Chronic   FOLLOW-UP APPT SCHEDULED WITH REFERRING PROVIDER: Yes August 21, 2024    SUBJECTIVE:  SUBJECTIVE STATEMENT:    Patient reporting to OPPT with a c/c of L knee pain.   PERTINENT HISTORY:   [name] is a 63 y.o. patient arriving to OPPT with L knee pain. She denies MOI or trauma to the knee. She reports that her knee will lock then pop with a relieving sensation. Additionally she reports that restricted ROM in her L knee compared to R knee. Patient reports aggravating factors: stairway navigation (both directions), sleeping at night. At night she  describes as the pain worsening as it radiates from the L knee distal into the ankle/foot and intermittently it will travel to the L hip. Relieving factors include: daily supplements, gabapentin , ice modalities.   Red flags: chills/fever, night sweats, unexplained weight gain/loss, unrelenting pain  PAIN:   Pain Intensity: Present: 0/10, Best: 0/10, Worst: 8/10 Pain location: L knee Pain quality: intermittent, aching, and throbbing  Radiating pain: Yes  Swelling: No  Popping, catching, locking: Yes  Numbness/Tingling: No Focal weakness or buckling: Yes 24-hour pain behavior: PM - Activity Dependent  History of prior back, hip, or knee injury, pain, surgery, or therapy: Yes  PRECAUTIONS: Fall, Low   WEIGHT BEARING RESTRICTIONS: No  FALLS: Has patient fallen in last 6 months? No  Living Environment Lives with: lives with their family Husband, Daughter, Son, Darden (3 yr old), 3 Dogs Lives in: House/apartment Stairs: Yes: Internal: 12-14 steps; on right going up Has following equipment at home: None  Prior level of function: Independent  Occupational demands: Works at [employer] (M-F, some Sat, 8-6)  Hobbies: None   Patient Goals: Patient would like to be able to perform stairway navigation without pain   OBJECTIVE:   Patient Surveys  LEFS: 28/80 = 35%    Cognition Cognition is within normal limits.     Gross Musculoskeletal Assessment Tremor: None Bulk: Normal Tone: Normal  GAIT: Distance walked: 60m Assistive device utilized: None Level of assistance: Complete Independence Comments: Reciprocal Gait, decreased velocity   Posture: Sitting: WNL Standing: WNL    AROM AROM (Normal range in degrees) AROM  Hip Right Left  Flexion (125) 90   Extension (15)    Abduction (40)    Adduction     Internal Rotation (45)    External Rotation (45)        Knee    Flexion (135) 110 110 *   Extension (0) 0 0      Ankle    Dorsiflexion (20)     Plantarflexion (50)    Inversion (35)    Eversion (15    (* = pain; Blank rows = not tested)  LE MMT: MMT (out of 5) Right Left  Hip flexion 4 4  Hip extension    Hip abduction (Seated) 4+ 4+  Hip adduction 5 5  Hip internal rotation    Hip external rotation    Knee flexion 4 4  Knee extension 4 4*  Ankle dorsiflexion 5 5  Ankle plantarflexion 5 5  Ankle inversion    Ankle eversion    (* = pain; Blank rows = not tested)  Sensation Grossly intact to light touch bilateral LEs as determined by testing dermatomes L2-S2. Proprioception, and hot/cold testing deferred on this date.  Reflexes R/L Knee Jerk (L3/4): 2+/2+  Ankle Jerk (S1/2): 2+/2+   Muscle Length Hamstrings: R: Not examined L: Negative Quadriceps Arvid): R: Not examined L: Not examined   Palpation Location LEFT  RIGHT  Quadriceps 0   Medial Hamstrings 0   Lateral Hamstrings 0   Lateral Hamstring tendon 0   Medial Hamstring tendon 0   Quadriceps tendon 0   Patella    Patellar Tendon    Tibial Tuberosity    Medial joint line  2*    Lateral joint line 1   MCL 1   LCL    Adductor Tubercle    Pes Anserine tendon 1   Infrapatellar fat pad    Fibular head    Popliteal fossa    (Blank rows = not tested) Graded on 0-4 scale (0 = no pain, 1 = pain, 2 = pain with wincing/grimacing/flinching, 3 = pain with withdrawal, 4 = unwilling to allow palpation), (Blank rows = not tested)  Passive Accessory Motion Deferred  VASCULAR Dorsalis pedis and posterior tibial pulses are palpable   SPECIAL TESTS  Ligamentous Stability  MCL: Valgus Stress (30 degrees flexion): R: Not examined L: Negative  LCL: Varus Stress (30 degrees flexion): R: Not examined L: Negative  Meniscus Tests McMurray's Test:  Medial Meniscus (Tibial ER): R: Not examined L: Positive for Pain with ER  Lateral Meniscus (Tibial IR): R: Not examined L: Not examined   Patellar Tendinopathy Inferior pole palpation with anterior  tilt: R: Not examined L: Negative  Functional Testing:  Squat: Deferred Sit to Stand: WNL, Reliant on UE in order to control descent Lateral Step Down: Deferred   : 13.99s 5TSTS: 16.15s   TODAY'S TREATMENT: DATE: 08/27/24  Subjective: Patient reports 0/10 in her L knee. She was able to navigate the stairs with rail support; she reports its easier to ascend the stairs and its still difficult to descend. She reports that she descends using sideways technique.  Omega Cable Machine            Knee Extension                       2 x 10 - 20#   1 x 10 - 25#                        Hamstring Curl (pt able to transition from knee ext to hamstring curl with improved external rotation)                       3 x 10 - 35#   Therapeutic Activity:   NuStep L6-2 x 6 min x LE only - > 80 SPM (Seat 8) for LE, endurance and strength; PT manually adjusted resistance throughout per patient's tolerance.    Partial Squats   2 x 10 - 10#    1 x 10 - 15#    Slider Lunges (Pillow case on floor) - Stability in knee/hip with dynamic movements            R Reverse: 2 x 10 - Two hands on tall PVC            R Lateral: 2 x 10 - Two hands on tall PVC    Lateral Step Down from 4 - BUE Rail for stairway capacity    2 x 10    Lateral Stepping against resistance   2 x 12' - Blue TB around ankles      PATIENT EDUCATION:  Education details: HEP, Exercise Technique  Person educated: Patient Education method: Explanation, Demonstration, and Handouts Education comprehension: verbalized understanding and returned demonstration   HOME EXERCISE PROGRAM:  Access Code: [email or web address] URL: [email or web address] Date: 08/25/2024 Prepared by: Lonni Pall  Exercises - Supine Heel Slide  - 1 x daily - 3-4 x weekly - 2-3 sets - 10-15 reps - Supine Active Straight Leg Raise  - 1 x daily - 3-4 x weekly - 2-3 sets - 8-10 reps - 5 hold - Long Sitting Quad Set with Towel Roll Under Heel  - 1 x  daily - 3-4 x weekly - 2-3 sets - 8-10 reps - Seated Long Arc Quad  - 1 x daily - 7 x weekly - 2-3 sets - 10-15 reps - 3s hold - Mini Squat  - 1 x daily - 3-4 x weekly - 2-3 sets - 8-10 reps - 5s hold - Forward Lunge with Back Leg Straight and Counter Support  - 1 x daily - 3-4 x weekly - 2-3 sets - 8-10 reps - Seated Hamstring Stretch  - 1 x daily - 7 x weekly - 3 sets - 15-30s hold - Supine Quadriceps Stretch with Strap on Table  - 1 x daily - 7 x weekly - 2-3 sets - 15-30s hold - Heel Raises with Counter Support  - 1 x daily - 7 x weekly - 2-3 sets - 10 reps - Forward Step Down with Counter Support at Side  - 1 x daily - 7 x weekly - 2-3 sets - 8-10 reps  Access Code: [email or web address] URL: [email or web address] Date: 08/06/2024 Prepared by: Lonni Pall  Exercises - Supine Heel Slide  - 1 x daily - 3-4 x weekly - 2-3 sets - 10-15 reps - Supine Active Straight Leg Raise  - 1 x daily - 3-4 x weekly - 2-3 sets - 8-10 reps - 5 hold - Long Sitting Quad Set with Towel Roll Under Heel  - 1 x daily - 3-4 x weekly - 2-3 sets - 8-10 reps - Seated Long Arc Quad  - 1 x daily - 7 x weekly - 2-3 sets - 10-15 reps - 3s hold - Mini Squat  - 1 x daily - 3-4 x weekly - 2-3 sets - 8-10 reps - 5s hold - Forward Lunge with Back Leg Straight and Counter Support  - 1 x daily - 3-4 x weekly - 2-3 sets - 8-10 reps - Seated Hamstring Stretch  - 1 x daily - 7 x weekly - 3 sets - 15-30s hold - Supine Quadriceps Stretch with Strap on Table  - 1 x daily - 7 x weekly - 2-3 sets - 15-30s hold   ASSESSMENT:  CLINICAL IMPRESSION: Continued PT POC with a focus on improving L knee pain and strength. Good tolerance to all PT interventions without pain in the L knee. Good control medial/lateral stability with slider lunge. Her pain continues to respond with PT interventions however her knee pain limits her capacity to descend stairs. Good carryover from last session with descending 4 step working towards  increasing stair capacity. Currently her pain and deficits limit her full participation with recreational tasks/work related tasks. Based on today's performance, patient will benefit from skilled physical therapy in order to address current deficits and maximize return to PLOF.    OBJECTIVE IMPAIRMENTS: decreased activity tolerance, decreased ROM, decreased strength, obesity, and pain.   ACTIVITY LIMITATIONS: lifting, bending, sitting, standing, squatting, and stairs  PARTICIPATION LIMITATIONS: driving and occupation  PERSONAL FACTORS: Age, Past/current experiences, Time since onset of injury/illness/exacerbation, and 3+ comorbidities: Multiple Sclerosis, Chronic Kidney Disease, Obesity, etc. are also affecting patient's functional outcome.   REHAB  POTENTIAL: Good  CLINICAL DECISION MAKING: Evolving/moderate complexity  EVALUATION COMPLEXITY: Moderate   GOALS: Goals reviewed with patient? Yes  SHORT TERM GOALS: Target date: 09/24/2024  Pt will be independent with HEP to improve strength and decrease knee pain to improve pain-free function at home and work. Baseline: 07/07/2024: Initial HEP  Goal status: INITIAL   LONG TERM GOALS: Target date: 10/22/2024  Patient will be able to safely navigate a flight of 12 stairs, using proper foot placement and handrail support, without requiring assistance from PT in order to demonstrate significant improvement in LE strength and safety. Baseline: 07/07/2024: Pain with ascending/descending with LLE.  Goal status: INITIAL  2.  Pt will decrease worst knee pain by at least 3 points on the NPRS in order to demonstrate clinically significant reduction in knee pain. Baseline: 07/07/2024: 8/10 NPS Goal status: INITIAL  3.  Pt will increase LEFS score by at least 9 points in order demonstrate clinically significant reduction in knee pain/disability.       Baseline: 07/07/2024: 28/80 = 35%  Goal status: INITIAL  4.  Pt will increase strength of  knee flexion/extension by at least 1/2 MMT grade and without pain in order to demonstrate improvement in strength and function  Baseline: 07/07/2024: L Knee Flex/Ext: 4/5, 4/5* Goal status: INITIAL  5.  Pt will decrease 5TSTS by at least 3 seconds in order to demonstrate clinically significant improvement in LE strength Baseline: 07/07/2024: 16.15s  Goal status: INITIAL  6.  Pt will increase by at least 0.13 m/s in order to demonstrate clinically significant improvement in community ambulation.  Baseline: 07/07/2024: .71 m/s (Limited Tourist information centre manager)  Goal status: INITIAL   PLAN: PT FREQUENCY: 1-2x/week  PT DURATION: 8 weeks  PLANNED INTERVENTIONS: Therapeutic exercises, Therapeutic activity, Neuromuscular re-education, Balance training, Gait training, Patient/Family education, Self Care, Joint mobilization, Joint manipulation, Vestibular training, Canalith repositioning, Orthotic/Fit training, DME instructions, Dry Needling, Electrical stimulation, Spinal manipulation, Spinal mobilization, Cryotherapy, Moist heat, Taping, Traction, Manual therapy, and Re-evaluation.  PLAN FOR NEXT SESSION: Review HEP; Joint mobilization PRN, Quadricep/Hip Strengthening, Progressing functional tasks (sit to stand and stairs).   Lonni Pall PT, DPT Physical Therapist- [place]  08/27/2024, 9:53 AM

## 2024-09-01 ENCOUNTER — Ambulatory Visit: Attending: Family Medicine

## 2024-09-01 DIAGNOSIS — M25562 Pain in left knee: Secondary | ICD-10-CM | POA: Insufficient documentation

## 2024-09-01 DIAGNOSIS — M25662 Stiffness of left knee, not elsewhere classified: Secondary | ICD-10-CM | POA: Insufficient documentation

## 2024-09-01 DIAGNOSIS — M6281 Muscle weakness (generalized): Secondary | ICD-10-CM | POA: Insufficient documentation

## 2024-09-01 NOTE — Therapy (Signed)
 OUTPATIENT PHYSICAL THERAPY KNEE TREATMENT/PROGRESS NOTE/RECERT  Dates of Reporting Period: 07/07/24 - 09/01/24  Patient Name: Patricia Deleon MRN: [id number] DOB:1961-03-16, 63 y.o., female Today's Date: 09/01/2024  END OF SESSION:  PT End of Session - 09/01/24 1121     Visit Number 10    Number of Visits 17    Date for PT Re-Evaluation 09/29/24    PT Start Time 1119    PT Stop Time 1200    PT Time Calculation (min) 41 min    Activity Tolerance Patient tolerated treatment well    Behavior During Therapy WFL for tasks assessed/performed            Past Medical History:  Diagnosis Date   Agoraphobia with panic disorder    Anemia    Depression    Erosive gastritis    Fundic gland polyps of stomach, benign    Geographic tongue    GERD (gastroesophageal reflux disease)    History of esophagogastroduodenoscopy (EGD)    Hypercholesterolemia    Hypertension    Intervertebral disc disorder    thoracic,thoracolumber,lumbosacral   Multiple sclerosis (HCC)    Obesity    Sleep apnea    Past Surgical History:  Procedure Laterality Date   COLONOSCOPY     COLONOSCOPY WITH PROPOFOL  N/A 07/07/2018   Procedure: COLONOSCOPY WITH PROPOFOL ;  Surgeon: Gaylyn Gladis PENNER, MD;  Location: [facility] ENDOSCOPY;  Service: Endoscopy;  Laterality: N/A;   ESOPHAGOGASTRODUODENOSCOPY (EGD) WITH PROPOFOL  N/A 01/31/2016   Procedure: ESOPHAGOGASTRODUODENOSCOPY (EGD) WITH PROPOFOL ;  Surgeon: Gladis PENNER Gaylyn, MD;  Location: [facility] ENDOSCOPY;  Service: Endoscopy;  Laterality: N/A;   IR FLUORO GUIDE CV LINE RIGHT  10/06/2018   IR REMOVAL TUN CV CATH W/O FL  10/15/2018   IR US  GUIDE VASC ACCESS RIGHT  10/06/2018   NISSEN FUNDOPLICATION     Patient Active Problem List   Diagnosis Date Noted   Chronic kidney disease, stage 2, mildly decreased GFR 02/12/2024   Annual physical exam 02/12/2024   Prediabetes 01/15/2023   Morbid obesity (HCC) 01/15/2023   Mixed hyperlipidemia 01/15/2023   Depression, recurrent  (HCC) 01/15/2023   Urinary tract infection, recurrent 07/11/2022   UTI symptoms 06/14/2022   Moderate episode of recurrent major depressive disorder (HCC) 05/28/2019   Excessive daytime sleepiness 08/25/2018   Multiple sclerosis (HCC) 07/08/2018   Neck pain 07/08/2018   Transient vision disturbance of left eye 06/18/2018   Urge incontinence 06/18/2018   Numbness 06/18/2018   Acne rosacea 05/07/2016   Gastroesophageal reflux disease without esophagitis 09/28/2015   Displacement of lumbar intervertebral disc without myelopathy 09/28/2015   Insomnia 09/28/2015   Thoracic, 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   Adiposity 09/28/2015   Restless leg 09/28/2015   Severe obstructive sleep apnea 09/28/2015   Vitamin D  deficiency 09/28/2015   Hypothyroidism 07/11/2015   Hypertension 06/09/2015    PCP: Donzella Lauraine SAILOR, DO  REFERRING PROVIDER: Donzella Lauraine SAILOR, DO  REFERRING DIAG:  [phone] (ICD-10-CM) - Acute pain of left knee  G35 (ICD-10-CM) - Multiple sclerosis (HCC)    RATIONALE FOR EVALUATION AND TREATMENT: Rehabilitation  THERAPY DIAG: Muscle weakness (generalized)  Acute pain of left knee  Stiffness of left knee, not elsewhere classified  ONSET DATE: Chronic   FOLLOW-UP APPT SCHEDULED WITH REFERRING PROVIDER: Yes August 21, 2024    SUBJECTIVE:  SUBJECTIVE STATEMENT:    Patient reporting to OPPT with a c/c of L knee pain.   PERTINENT HISTORY:   Patricia Deleon is a 63 y.o. patient arriving to OPPT with L knee pain. She denies MOI or trauma to the knee. She reports that her knee will lock then pop with a relieving sensation. Additionally she reports that restricted ROM in her L knee compared to R knee. Patient reports aggravating factors:  stairway navigation (both directions), sleeping at night. At night she describes as the pain worsening as it radiates from the L knee distal into the ankle/foot and intermittently it will travel to the L hip. Relieving factors include: daily supplements, gabapentin , ice modalities.   Red flags: chills/fever, night sweats, unexplained weight gain/loss, unrelenting pain  PAIN:   Pain Intensity: Present: 0/10, Best: 0/10, Worst: 8/10 Pain location: L knee Pain quality: intermittent, aching, and throbbing  Radiating pain: Yes  Swelling: No  Popping, catching, locking: Yes  Numbness/Tingling: No Focal weakness or buckling: Yes 24-hour pain behavior: PM - Activity Dependent  History of prior back, hip, or knee injury, pain, surgery, or therapy: Yes  PRECAUTIONS: Fall, Low   WEIGHT BEARING RESTRICTIONS: No  FALLS: Has patient fallen in last 6 months? No  Living Environment Lives with: lives with their family Husband, Daughter, Son, Darden (3 yr old), 3 Dogs Lives in: House/apartment Stairs: Yes: Internal: 12-14 steps; on right going up Has following equipment at home: None  Prior level of function: Independent  Occupational demands: Works at [employer] (M-F, some Sat, 8-6)  Hobbies: None   Patient Goals: Patient would like to be able to perform stairway navigation without pain   OBJECTIVE:   Patient Surveys  LEFS: 28/80 = 35%    Cognition Cognition is within normal limits.     Gross Musculoskeletal Assessment Tremor: None Bulk: Normal Tone: Normal  GAIT: Distance walked: 46m Assistive device utilized: None Level of assistance: Complete Independence Comments: Reciprocal Gait, decreased velocity   Posture: Sitting: WNL Standing: WNL    AROM AROM (Normal range in degrees) AROM  Hip Right Left  Flexion (125) 90   Extension (15)    Abduction (40)    Adduction     Internal Rotation (45)    External Rotation (45)        Knee    Flexion (135) 110  110 *   Extension (0) 0 0      Ankle    Dorsiflexion (20)    Plantarflexion (50)    Inversion (35)    Eversion (15    (* = pain; Blank rows = not tested)  LE MMT: MMT (out of 5) Right Left  Hip flexion 4 4  Hip extension    Hip abduction (Seated) 4+ 4+  Hip adduction 5 5  Hip internal rotation    Hip external rotation    Knee flexion 4 4  Knee extension 4 4*  Ankle dorsiflexion 5 5  Ankle plantarflexion 5 5  Ankle inversion    Ankle eversion    (* = pain; Blank rows = not tested)  Sensation Grossly intact to light touch bilateral LEs as determined by testing dermatomes L2-S2. Proprioception, and hot/cold testing deferred on this date.  Reflexes R/L Knee Jerk (L3/4): 2+/2+  Ankle Jerk (S1/2): 2+/2+   Muscle Length Hamstrings: R: Not examined L: Negative Quadriceps Arvid): R: Not examined L: Not examined   Palpation Location LEFT  RIGHT  Quadriceps 0   Medial Hamstrings 0   Lateral Hamstrings 0   Lateral Hamstring tendon 0   Medial Hamstring tendon 0   Quadriceps tendon 0   Patella    Patellar Tendon    Tibial Tuberosity    Medial joint line  2*    Lateral joint line 1   MCL 1   LCL    Adductor Tubercle    Pes Anserine tendon 1   Infrapatellar fat pad    Fibular head    Popliteal fossa    (Blank rows = not tested) Graded on 0-4 scale (0 = no pain, 1 = pain, 2 = pain with wincing/grimacing/flinching, 3 = pain with withdrawal, 4 = unwilling to allow palpation), (Blank rows = not tested)  Passive Accessory Motion Deferred  VASCULAR Dorsalis pedis and posterior tibial pulses are palpable   SPECIAL TESTS  Ligamentous Stability  MCL: Valgus Stress (30 degrees flexion): R: Not examined L: Negative  LCL: Varus Stress (30 degrees flexion): R: Not examined L: Negative  Meniscus Tests McMurray's Test:  Medial Meniscus (Tibial ER): R: Not examined L: Positive for Pain with ER  Lateral Meniscus (Tibial IR): R: Not examined L: Not  examined   Patellar Tendinopathy Inferior pole palpation with anterior tilt: R: Not examined L: Negative  Functional Testing:  Squat: Deferred Sit to Stand: WNL, Reliant on UE in order to control descent Lateral Step Down: Deferred   : 13.99s 5TSTS: 16.15s   TODAY'S TREATMENT: DATE: 09/01/24  Subjective: Patient reports no knee pain. Continuing to improve asc/desc steps with reciprocal pattern. Still needs rails however. Reports struggling with her endurance more than anything. Has a lot of trouble standing up from her new couch which is a lower surface.   Physical Performance:   Reviewed prior goals. See clinical impression/goals section for details   3 RM on leg press R/L:  115/125 lbs   30 sec STS: 14 reps    PATIENT EDUCATION:  Education details: HEP, Exercise Technique  Person educated: Patient Education method: Explanation, Demonstration, and Handouts Education comprehension: verbalized understanding and returned demonstration   HOME EXERCISE PROGRAM:  Access Code: [email or web address] URL: [email or web address] Date: 08/25/2024 Prepared by: Lonni Pall  Exercises - Supine Heel Slide  - 1 x daily - 3-4 x weekly - 2-3 sets - 10-15 reps - Supine Active Straight Leg Raise  - 1 x daily - 3-4 x weekly - 2-3 sets - 8-10 reps - 5 hold - Long Sitting Quad Set with Towel Roll Under Heel  - 1 x daily - 3-4 x weekly - 2-3 sets - 8-10 reps - Seated Long Arc Quad  - 1 x daily - 7 x weekly - 2-3 sets - 10-15 reps - 3s hold - Mini Squat  - 1 x daily - 3-4 x weekly - 2-3 sets - 8-10 reps - 5s hold - Forward Lunge with Back Leg Straight and Counter Support  - 1 x daily - 3-4 x weekly - 2-3 sets - 8-10 reps - Seated Hamstring Stretch  - 1 x daily - 7 x weekly - 3 sets - 15-30s hold - Supine Quadriceps Stretch with Strap on Table  - 1 x daily - 7 x weekly - 2-3 sets - 15-30s hold - Heel Raises with Counter Support  - 1 x daily - 7 x weekly - 2-3 sets - 10 reps -  Forward Step Down with Counter Support at Side  - 1 x daily - 7 x weekly -  2-3 sets - 8-10 reps  Access Code: [email or web address] URL: [email or web address] Date: 08/06/2024 Prepared by: Lonni Pall  Exercises - Supine Heel Slide  - 1 x daily - 3-4 x weekly - 2-3 sets - 10-15 reps - Supine Active Straight Leg Raise  - 1 x daily - 3-4 x weekly - 2-3 sets - 8-10 reps - 5 hold - Long Sitting Quad Set with Towel Roll Under Heel  - 1 x daily - 3-4 x weekly - 2-3 sets - 8-10 reps - Seated Long Arc Quad  - 1 x daily - 7 x weekly - 2-3 sets - 10-15 reps - 3s hold - Mini Squat  - 1 x daily - 3-4 x weekly - 2-3 sets - 8-10 reps - 5s hold - Forward Lunge with Back Leg Straight and Counter Support  - 1 x daily - 3-4 x weekly - 2-3 sets - 8-10 reps - Seated Hamstring Stretch  - 1 x daily - 7 x weekly - 3 sets - 15-30s hold - Supine Quadriceps Stretch with Strap on Table  - 1 x daily - 7 x weekly - 2-3 sets - 15-30s hold   ASSESSMENT:  CLINICAL IMPRESSION: Pt at end of POC warranting progress note/RECERT. Pt has met many of her LTG's but remains with LE weakness compared to sex matched and age matched norms and remains with some disability based off of LEFS scores. Updated new goal with 30 second STS due to limited muscular endurance. Pt remains mostly limited in ability to STS from low surfaces at home without use of UE support and still has trouble entering/exiting home at the front door as there is no rail here. PT to request additional 4 weeks of PT 2x/week to address residual weakness. Based on today's performance, patient will benefit from skilled physical therapy in order to address current deficits and maximize return to PLOF.    OBJECTIVE IMPAIRMENTS: decreased activity tolerance, decreased ROM, decreased strength, obesity, and pain.   ACTIVITY LIMITATIONS: lifting, bending, sitting, standing, squatting, and stairs  PARTICIPATION LIMITATIONS: driving and occupation  PERSONAL FACTORS:  Age, Past/current experiences, Time since onset of injury/illness/exacerbation, and 3+ comorbidities: Multiple Sclerosis, Chronic Kidney Disease, Obesity, etc. are also affecting patient's functional outcome.   REHAB POTENTIAL: Good  CLINICAL DECISION MAKING: Evolving/moderate complexity  EVALUATION COMPLEXITY: Moderate   GOALS: Goals reviewed with patient? Yes  SHORT TERM GOALS: Target date: 09/29/2024  Pt will be independent with HEP to improve strength and decrease knee pain to improve pain-free function at home and work. Baseline: 07/07/2024: Initial HEP  Goal status: INITIAL   LONG TERM GOALS: Target date: 10/27/2024  Patient will be able to safely navigate a flight of 12 stairs, using proper foot placement and handrail support, without requiring assistance from PT in order to demonstrate significant improvement in LE strength and safety. Baseline: 07/07/2024: Pain with ascending/descending with LLE; 09/01/24: Performing reciprocally and with hand rail without external support. Goal status: MET  2.  Pt will decrease worst knee pain by at least 3 points on the NPRS in order to demonstrate clinically significant reduction in knee pain. Baseline: 07/07/2024: 8/10 NPS; 09/01/24: 09/01/24: 0/10 NPS for the last week Goal status: MET  3.  Pt will increase LEFS score by at least 9 points in order demonstrate clinically significant reduction in knee pain/disability.       Baseline: 07/07/2024: 28/80 = 35%; 36/80 = 45%;   Goal status: ON GOING   4.  Pt will increase strength of  knee flexion/extension by at least 1/2 MMT grade and without pain in order to demonstrate improvement in strength and function  Baseline: 07/07/2024: L Knee Flex/Ext: 4/5, 4/5*; 09/01/24: 5/5 for knee extension and flexion and no pain Goal status: MET  5.  Pt will decrease 5TSTS by at least 3 seconds in order to demonstrate clinically significant improvement in LE strength Baseline: 07/07/2024: 16.15s; 9.36 seconds    Goal status: MET  6.  Pt will increase by at least 0.13 m/s in order to demonstrate clinically significant improvement in community ambulation.  Baseline: 07/07/2024: .71 m/s (Limited Community Ambulator); 09/01/24: .83 m/s self selected; 1.0 m/s fast speed Goal status: PARTIALLY MET  7. Pt will improve 30 second STS test to 15 reps or greater to meet age and sex matched norms for muscular strength/endurance for ADL completion.      Baseline: 09/01/24: 14 reps     Goal status: Initial   PLAN: PT FREQUENCY: 1-2x/week  PT DURATION: 4 weeks  PLANNED INTERVENTIONS: Therapeutic exercises, Therapeutic activity, Neuromuscular re-education, Balance training, Gait training, Patient/Family education, Self Care, Joint mobilization, Joint manipulation, Vestibular training, Canalith repositioning, Orthotic/Fit training, DME instructions, Dry Needling, Electrical stimulation, Spinal manipulation, Spinal mobilization, Cryotherapy, Moist heat, Taping, Traction, Manual therapy, and Re-evaluation.  PLAN FOR NEXT SESSION: STS from lowered surface   Dorina HERO. Fairly IV, PT, DPT Physical Therapist- [place]  [facility] 09/01/2024, 12:00 PM

## 2024-09-03 ENCOUNTER — Ambulatory Visit

## 2024-09-03 DIAGNOSIS — M6281 Muscle weakness (generalized): Secondary | ICD-10-CM

## 2024-09-03 DIAGNOSIS — M25562 Pain in left knee: Secondary | ICD-10-CM

## 2024-09-03 DIAGNOSIS — M25662 Stiffness of left knee, not elsewhere classified: Secondary | ICD-10-CM

## 2024-09-03 NOTE — Therapy (Addendum)
 OUTPATIENT PHYSICAL THERAPY KNEE TREATMENT  Patient Name: Patricia Deleon MRN: [id number] DOB:06-22-1961, 63 y.o., female Today's Date: 09/03/2024  END OF SESSION:  PT End of Session - 09/03/24 1125     Visit Number 11    Number of Visits 17    Date for PT Re-Evaluation 09/29/24    PT Start Time 1122    PT Stop Time 1200    PT Time Calculation (min) 38 min    Activity Tolerance Patient tolerated treatment well    Behavior During Therapy WFL for tasks assessed/performed            Past Medical History:  Diagnosis Date   Agoraphobia with panic disorder    Anemia    Depression    Erosive gastritis    Fundic gland polyps of stomach, benign    Geographic tongue    GERD (gastroesophageal reflux disease)    History of esophagogastroduodenoscopy (EGD)    Hypercholesterolemia    Hypertension    Intervertebral disc disorder    thoracic,thoracolumber,lumbosacral   Multiple sclerosis (HCC)    Obesity    Sleep apnea    Past Surgical History:  Procedure Laterality Date   COLONOSCOPY     COLONOSCOPY WITH PROPOFOL  N/A 07/07/2018   Procedure: COLONOSCOPY WITH PROPOFOL ;  Surgeon: Gaylyn Gladis PENNER, MD;  Location: [facility] ENDOSCOPY;  Service: Endoscopy;  Laterality: N/A;   ESOPHAGOGASTRODUODENOSCOPY (EGD) WITH PROPOFOL  N/A 01/31/2016   Procedure: ESOPHAGOGASTRODUODENOSCOPY (EGD) WITH PROPOFOL ;  Surgeon: Gladis PENNER Gaylyn, MD;  Location: [facility] ENDOSCOPY;  Service: Endoscopy;  Laterality: N/A;   IR FLUORO GUIDE CV LINE RIGHT  10/06/2018   IR REMOVAL TUN CV CATH W/O FL  10/15/2018   IR US  GUIDE VASC ACCESS RIGHT  10/06/2018   NISSEN FUNDOPLICATION     Patient Active Problem List   Diagnosis Date Noted   Chronic kidney disease, stage 2, mildly decreased GFR 02/12/2024   Annual physical exam 02/12/2024   Prediabetes 01/15/2023   Morbid obesity (HCC) 01/15/2023   Mixed hyperlipidemia 01/15/2023   Depression, recurrent (HCC) 01/15/2023   Urinary tract infection, recurrent 07/11/2022    UTI symptoms 06/14/2022   Moderate episode of recurrent major depressive disorder (HCC) 05/28/2019   Excessive daytime sleepiness 08/25/2018   Multiple sclerosis (HCC) 07/08/2018   Neck pain 07/08/2018   Transient vision disturbance of left eye 06/18/2018   Urge incontinence 06/18/2018   Numbness 06/18/2018   Acne rosacea 05/07/2016   Gastroesophageal reflux disease without esophagitis 09/28/2015   Displacement of lumbar intervertebral disc without myelopathy 09/28/2015   Insomnia 09/28/2015   Thoracic, 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   Adiposity 09/28/2015   Restless leg 09/28/2015   Severe obstructive sleep apnea 09/28/2015   Vitamin D  deficiency 09/28/2015   Hypothyroidism 07/11/2015   Hypertension 06/09/2015    PCP: Donzella Lauraine SAILOR, DO  REFERRING PROVIDER: Donzella Lauraine SAILOR, DO  REFERRING DIAG:  [phone] (ICD-10-CM) - Acute pain of left knee  G35 (ICD-10-CM) - Multiple sclerosis (HCC)    RATIONALE FOR EVALUATION AND TREATMENT: Rehabilitation  THERAPY DIAG: Muscle weakness (generalized)  Acute pain of left knee  Stiffness of left knee, not elsewhere classified  ONSET DATE: Chronic   FOLLOW-UP APPT SCHEDULED WITH REFERRING PROVIDER: Yes August 21, 2024    SUBJECTIVE:  SUBJECTIVE STATEMENT:    Patient reporting to OPPT with a c/c of L knee pain.   PERTINENT HISTORY:   [name] is a 63 y.o. patient arriving to OPPT with L knee pain. She denies MOI or trauma to the knee. She reports that her knee will lock then pop with a relieving sensation. Additionally she reports that restricted ROM in her L knee compared to R knee. Patient reports aggravating factors: stairway navigation (both directions), sleeping at night. At night she  describes as the pain worsening as it radiates from the L knee distal into the ankle/foot and intermittently it will travel to the L hip. Relieving factors include: daily supplements, gabapentin , ice modalities.   Red flags: chills/fever, night sweats, unexplained weight gain/loss, unrelenting pain  PAIN:   Pain Intensity: Present: 0/10, Best: 0/10, Worst: 8/10 Pain location: L knee Pain quality: intermittent, aching, and throbbing  Radiating pain: Yes  Swelling: No  Popping, catching, locking: Yes  Numbness/Tingling: No Focal weakness or buckling: Yes 24-hour pain behavior: PM - Activity Dependent  History of prior back, hip, or knee injury, pain, surgery, or therapy: Yes  PRECAUTIONS: Fall, Low   WEIGHT BEARING RESTRICTIONS: No  FALLS: Has patient fallen in last 6 months? No  Living Environment Lives with: lives with their family Husband, Daughter, Son, Darden (3 yr old), 3 Dogs Lives in: House/apartment Stairs: Yes: Internal: 12-14 steps; on right going up Has following equipment at home: None  Prior level of function: Independent  Occupational demands: Works at [employer] (M-F, some Sat, 8-6)  Hobbies: None   Patient Goals: Patient would like to be able to perform stairway navigation without pain   OBJECTIVE:   Patient Surveys  LEFS: 28/80 = 35%    Cognition Cognition is within normal limits.     Gross Musculoskeletal Assessment Tremor: None Bulk: Normal Tone: Normal  GAIT: Distance walked: 55m Assistive device utilized: None Level of assistance: Complete Independence Comments: Reciprocal Gait, decreased velocity   Posture: Sitting: WNL Standing: WNL    AROM AROM (Normal range in degrees) AROM  Hip Right Left  Flexion (125) 90   Extension (15)    Abduction (40)    Adduction     Internal Rotation (45)    External Rotation (45)        Knee    Flexion (135) 110 110 *   Extension (0) 0 0      Ankle    Dorsiflexion (20)     Plantarflexion (50)    Inversion (35)    Eversion (15    (* = pain; Blank rows = not tested)  LE MMT: MMT (out of 5) Right Left  Hip flexion 4 4  Hip extension    Hip abduction (Seated) 4+ 4+  Hip adduction 5 5  Hip internal rotation    Hip external rotation    Knee flexion 4 4  Knee extension 4 4*  Ankle dorsiflexion 5 5  Ankle plantarflexion 5 5  Ankle inversion    Ankle eversion    (* = pain; Blank rows = not tested)  Sensation Grossly intact to light touch bilateral LEs as determined by testing dermatomes L2-S2. Proprioception, and hot/cold testing deferred on this date.  Reflexes R/L Knee Jerk (L3/4): 2+/2+  Ankle Jerk (S1/2): 2+/2+   Muscle Length Hamstrings: R: Not examined L: Negative Quadriceps Arvid): R: Not examined L: Not examined   Palpation Location LEFT  RIGHT  Quadriceps 0   Medial Hamstrings 0   Lateral Hamstrings 0   Lateral Hamstring tendon 0   Medial Hamstring tendon 0   Quadriceps tendon 0   Patella    Patellar Tendon    Tibial Tuberosity    Medial joint line  2*    Lateral joint line 1   MCL 1   LCL    Adductor Tubercle    Pes Anserine tendon 1   Infrapatellar fat pad    Fibular head    Popliteal fossa    (Blank rows = not tested) Graded on 0-4 scale (0 = no pain, 1 = pain, 2 = pain with wincing/grimacing/flinching, 3 = pain with withdrawal, 4 = unwilling to allow palpation), (Blank rows = not tested)  Passive Accessory Motion Deferred  VASCULAR Dorsalis pedis and posterior tibial pulses are palpable   SPECIAL TESTS  Ligamentous Stability  MCL: Valgus Stress (30 degrees flexion): R: Not examined L: Negative  LCL: Varus Stress (30 degrees flexion): R: Not examined L: Negative  Meniscus Tests McMurray's Test:  Medial Meniscus (Tibial ER): R: Not examined L: Positive for Pain with ER  Lateral Meniscus (Tibial IR): R: Not examined L: Not examined   Patellar Tendinopathy Inferior pole palpation with anterior  tilt: R: Not examined L: Negative  Functional Testing:  Squat: Deferred Sit to Stand: WNL, Reliant on UE in order to control descent Lateral Step Down: Deferred   : 13.99s 5TSTS: 16.15s   TODAY'S TREATMENT: DATE: 09/03/24  Subjective: Patient reports no pain in her knees. Minor soreness following last PT session. She reports happiness with progress. No further questions or concerns.   Therapeutic Exercise:   Seated Knee Extension   1 x 10 - 15#    2 x 10 - 25#     Seated Hamstring Curl    1 x 10 - 30#   2 x 10 - 35#   Therapeutic Activity:   TRX Squats for Squatting and transfer capacity   2 x 10    Resisted Sit to Stand from low mat table    1 x 10 - 21 Height with 3 Kg MB   1 x 10 - 19.5 Height wit 3 Kg MB    1 x 10 - 19.5 Height with 10 KB   Sled Push for glute strength and LE strength   2 x 10 - 20# added    2 x 10 - 30# added    Standing Calve Raise/Stretch at 1st stair step - BUE Support    2 x 15 - for calve strengthening to optimize stairs/walking    PATIENT EDUCATION:  Education details: HEP, Exercise Technique  Person educated: Patient Education method: Explanation, Demonstration, and Handouts Education comprehension: verbalized understanding and returned demonstration   HOME EXERCISE PROGRAM:  Access Code: [email or web address] URL: [email or web address] Date: 08/25/2024 Prepared by: Lonni Pall  Exercises - Supine Heel Slide  - 1 x daily - 3-4 x weekly - 2-3 sets - 10-15 reps - Supine Active Straight Leg Raise  - 1 x daily - 3-4 x weekly - 2-3 sets - 8-10 reps - 5 hold - Long Sitting Quad Set with Towel Roll Under Heel  - 1 x daily - 3-4 x weekly - 2-3 sets - 8-10 reps - Seated Long Arc Quad  - 1 x daily - 7 x weekly - 2-3 sets - 10-15 reps - 3s hold - Mini Squat  - 1 x daily - 3-4 x weekly - 2-3 sets -  8-10 reps - 5s hold - Forward Lunge with Back Leg Straight and Counter Support  - 1 x daily - 3-4 x weekly - 2-3 sets - 8-10  reps - Seated Hamstring Stretch  - 1 x daily - 7 x weekly - 3 sets - 15-30s hold - Supine Quadriceps Stretch with Strap on Table  - 1 x daily - 7 x weekly - 2-3 sets - 15-30s hold - Heel Raises with Counter Support  - 1 x daily - 7 x weekly - 2-3 sets - 10 reps - Forward Step Down with Counter Support at Side  - 1 x daily - 7 x weekly - 2-3 sets - 8-10 reps  Access Code: [email or web address] URL: [email or web address] Date: 08/06/2024 Prepared by: Lonni Pall  Exercises - Supine Heel Slide  - 1 x daily - 3-4 x weekly - 2-3 sets - 10-15 reps - Supine Active Straight Leg Raise  - 1 x daily - 3-4 x weekly - 2-3 sets - 8-10 reps - 5 hold - Long Sitting Quad Set with Towel Roll Under Heel  - 1 x daily - 3-4 x weekly - 2-3 sets - 8-10 reps - Seated Long Arc Quad  - 1 x daily - 7 x weekly - 2-3 sets - 10-15 reps - 3s hold - Mini Squat  - 1 x daily - 3-4 x weekly - 2-3 sets - 8-10 reps - 5s hold - Forward Lunge with Back Leg Straight and Counter Support  - 1 x daily - 3-4 x weekly - 2-3 sets - 8-10 reps - Seated Hamstring Stretch  - 1 x daily - 7 x weekly - 3 sets - 15-30s hold - Supine Quadriceps Stretch with Strap on Table  - 1 x daily - 7 x weekly - 2-3 sets - 15-30s hold   ASSESSMENT:  CLINICAL IMPRESSION: Continued PT POC with main focus on LE strength and functional activities. Great demonstration of transferring from lowest surface on mat table (20) with resistance, no pain noted. Increased activity tolerance with activities today as she pushed resisted sled without severe exhaustion. PT to continue focusing on functional strengthening (stairs, curbs, transfers) in order to optimize daily/work related movements. Based on today's performance, patient will continue to benefit from skilled PT in order to address current deficits and maximize return to PLOF.    OBJECTIVE IMPAIRMENTS: decreased activity tolerance, decreased ROM, decreased strength, obesity, and pain.   ACTIVITY  LIMITATIONS: lifting, bending, sitting, standing, squatting, and stairs  PARTICIPATION LIMITATIONS: driving and occupation  PERSONAL FACTORS: Age, Past/current experiences, Time since onset of injury/illness/exacerbation, and 3+ comorbidities: Multiple Sclerosis, Chronic Kidney Disease, Obesity, etc. are also affecting patient's functional outcome.   REHAB POTENTIAL: Good  CLINICAL DECISION MAKING: Evolving/moderate complexity  EVALUATION COMPLEXITY: Moderate   GOALS: Goals reviewed with patient? Yes  SHORT TERM GOALS: Target date: 10/01/2024  Pt will be independent with HEP to improve strength and decrease knee pain to improve pain-free function at home and work. Baseline: 07/07/2024: Initial HEP  Goal status: INITIAL   LONG TERM GOALS: Target date: 10/29/2024  Patient will be able to safely navigate a flight of 12 stairs, using proper foot placement and handrail support, without requiring assistance from PT in order to demonstrate significant improvement in LE strength and safety. Baseline: 07/07/2024: Pain with ascending/descending with LLE; 09/01/24: Performing reciprocally and with hand rail without external support. Goal status: MET  2.  Pt will decrease worst knee pain by at least 3 points on the NPRS  in order to demonstrate clinically significant reduction in knee pain. Baseline: 07/07/2024: 8/10 NPS; 09/01/24: 09/01/24: 0/10 NPS for the last week Goal status: MET  3.  Pt will increase LEFS score by at least 9 points in order demonstrate clinically significant reduction in knee pain/disability.       Baseline: 07/07/2024: 28/80 = 35%; 09/03/2024 36/80 = 45%;   Goal status: ON GOING   4.  Pt will increase strength of knee flexion/extension by at least 1/2 MMT grade and without pain in order to demonstrate improvement in strength and function  Baseline: 07/07/2024: L Knee Flex/Ext: 4/5, 4/5*; 09/01/24: 5/5 for knee extension and flexion and no pain Goal status: MET  5.  Pt will  decrease 5TSTS by at least 3 seconds in order to demonstrate clinically significant improvement in LE strength Baseline: 07/07/2024: 16.15s; 9.36 seconds   Goal status: MET  6.  Pt will increase by at least 0.13 m/s in order to demonstrate clinically significant improvement in community ambulation.  Baseline: 07/07/2024: .71 m/s (Limited Community Ambulator); 09/01/24: .83 m/s self selected; 1.0 m/s fast speed Goal status: PARTIALLY MET  7. Pt will improve 30 second STS test to 15 reps or greater to meet age and sex matched norms for muscular strength/endurance for ADL completion.      Baseline: 09/01/24: 14 reps     Goal status: Initial   PLAN: PT FREQUENCY: 1-2x/week  PT DURATION: 4 weeks  PLANNED INTERVENTIONS: Therapeutic exercises, Therapeutic activity, Neuromuscular re-education, Balance training, Gait training, Patient/Family education, Self Care, Joint mobilization, Joint manipulation, Vestibular training, Canalith repositioning, Orthotic/Fit training, DME instructions, Dry Needling, Electrical stimulation, Spinal manipulation, Spinal mobilization, Cryotherapy, Moist heat, Taping, Traction, Manual therapy, and Re-evaluation.  PLAN FOR NEXT SESSION: Functional strengthening (STS, Stairs, Curbs)    Lonni Pall PT, DPT Physical Therapist- [place]  09/03/2024, 11:25 AM

## 2024-09-07 ENCOUNTER — Ambulatory Visit: Admitting: Urology

## 2024-09-07 ENCOUNTER — Ambulatory Visit

## 2024-09-07 VITALS — BP 154/85 | HR 80

## 2024-09-07 DIAGNOSIS — M6281 Muscle weakness (generalized): Secondary | ICD-10-CM | POA: Diagnosis not present

## 2024-09-07 DIAGNOSIS — M25662 Stiffness of left knee, not elsewhere classified: Secondary | ICD-10-CM

## 2024-09-07 DIAGNOSIS — N3946 Mixed incontinence: Secondary | ICD-10-CM

## 2024-09-07 DIAGNOSIS — M25562 Pain in left knee: Secondary | ICD-10-CM

## 2024-09-07 LAB — URINALYSIS, COMPLETE
Bilirubin, UA: NEGATIVE
Glucose, UA: NEGATIVE
Ketones, UA: NEGATIVE
Nitrite, UA: NEGATIVE
Protein,UA: NEGATIVE
Specific Gravity, UA: <1.005 — ABNORMAL LOW (ref 1.005–1.030)
Urobilinogen, Ur: 0.2 mg/dL (ref 0.2–1.0)
pH, UA: 6 (ref 5.0–7.5)

## 2024-09-07 LAB — MICROSCOPIC EXAMINATION: WBC, UA: >30 /HPF — AB (ref 0–5)

## 2024-09-07 MED ORDER — GEMTESA 75 MG PO TABS: 75.0000 mg | ORAL_TABLET | Freq: Every day | ORAL | 11 refills | Status: DC

## 2024-09-07 MED ORDER — GEMTESA 75 MG PO TABS: 75.0000 mg | ORAL_TABLET | Freq: Every day | ORAL | Status: AC

## 2024-09-07 NOTE — Progress Notes (Signed)
 09/07/2024 2:06 PM   Patricia Deleon 10-09-1961 [id number]  Referring provider: Donzella Lauraine SAILOR, DO [address] [place],  [place] [address]  Chief Complaint  Patient presents with   Cysto    HPI: ST: Recurrent urinary tract infections with positive cultures and some resistance.  Recently provided Myrbetriq  for mixed incontinence.  Initially responded very well to Myrbetriq .  Has multiple sclerosis.  Last culture positive   Today I was consulted to assess the patient's urinary incontinence.  She has urgency incontinence.  She leaks with coughing sneezing sometimes bending lifting.  She has mild bedwetting.  She wears 2 or 3 pads a day sometimes soaked.  Flow was good and she feels empty, urgency incontinence with very little warning as the primary symptom   She says she has not had bladder infections for almost a year presenting is frequency, sometimes discomfort, sometimes without symptoms.   She has had multiple sclerosis for 6 years.  She has not had a hysterectomy.   On pelvic examination patient had mild grade 2 hypermobility the bladder neck and negative cough test.  No significant prolapse    Patient has mixed incontinence and multiple sclerosis. She has milder bedwetting. She will return for urodynamics and cystoscopy. She is a partial responder to Myrbetriq  and has not tried other medication. Call if culture positive. May or may not need prophylaxis in the future   Today I last saw the patient in October 2024.  Urine culture negative Continence status unchanged.  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  She says she has not had any infections  On requestioning patient says her main symptom is definitely urge incontinence and also foot and the floor syndrome in the middle of the night.  She only leaks a small amount with coughing sneezing.  She does not see out of her right eye due to her multiple  sclerosis  Grade 1 hypermobility the bladder neck no stress incontinence after cystoscopy with light cough  Cystoscopy: Patient underwent flexible cystoscopy.  Bladder close and trigone were normal.  Urine was little bit cloudy.  Urine aspirated and sent for culture.  A few white flecks  During urodynamics patient did not void was catheterized for a few milliliters.  Maxim bladder capacity is 340 mL.  Bladder was stable.  She had increased bladder sensation.  Cough leak point pressure 300 mL was 25 cm of water with mild to moderate leakage.  Cough leak point pressure 340 mL was 53 cm of water with mild to moderate leakage.  Valsalva leak point pressure was 49 cm water with mild leakage at the same volume.  During voiding she voided 99 mL with maximal flow of 40 mL/s.  In my opinion she voided by straining and did not generate a detrusor contraction.  It was felt that she may be generating a bladder contraction but I think it was subtraction artifact and she did not truly generate a contraction.  Residual 240 mL.  Bladder neck descended less than a centimeter.  EMG activity normal.  No Christ-mas tree shape bladder.  The details of the urodynamics are signed and dictated    PMH: Past Medical History:  Diagnosis Date   Agoraphobia with panic disorder    Anemia    Depression    Erosive gastritis    Fundic gland polyps of stomach, benign    Geographic tongue    GERD (gastroesophageal reflux disease)  History of esophagogastroduodenoscopy (EGD)    Hypercholesterolemia    Hypertension    Intervertebral disc disorder    thoracic,thoracolumber,lumbosacral   Multiple sclerosis (HCC)    Obesity    Sleep apnea     Surgical History: Past Surgical History:  Procedure Laterality Date   COLONOSCOPY     COLONOSCOPY WITH PROPOFOL  N/A 07/07/2018   Procedure: COLONOSCOPY WITH PROPOFOL ;  Surgeon: Gaylyn Gladis PENNER, MD;  Location[facility] ENDOSCOPY;  Service: Endoscopy;  Laterality: N/A;    ESOPHAGOGASTRODUODENOSCOPY (EGD) WITH PROPOFOL  N/A 01/31/2016   Procedure: ESOPHAGOGASTRODUODENOSCOPY (EGD) WITH PROPOFOL ;  Surgeon: Gladis PENNER Gaylyn, MD;  Location: [facility] ENDOSCOPY;  Service: Endoscopy;  Laterality: N/A;   IR FLUORO GUIDE CV LINE RIGHT  10/06/2018   IR REMOVAL TUN CV CATH W/O FL  10/15/2018   IR US  GUIDE VASC ACCESS RIGHT  10/06/2018   NISSEN FUNDOPLICATION      Home Medications:  Allergies as of 09/07/2024       Reactions   Shellfish Allergy Hives        Medication List        Accurate as of September 07, 2024  2:06 PM. If you have any questions, ask your nurse or doctor.          ARIPiprazole  10 MG tablet Commonly known as: ABILIFY  Take 1 tablet (10 mg total) by mouth daily.   Armodafinil  250 MG tablet Take 1 tablet (250 mg total) by mouth daily.   aspirin 81 MG chewable tablet Chew 81 mg by mouth daily.   Fish Oil 500 MG Caps Take by mouth.   gabapentin  100 MG capsule Commonly known as: NEURONTIN  Take 1 capsule (100 mg total) by mouth 2 (two) times daily.   Glatiramer Acetate 40 MG/ML Sosy Inject into the skin.   HM Slow Release Iron 45 MG Tbcr Generic drug: Ferrous Sulfate Dried Take by mouth.   ketoconazole  2 % shampoo Commonly known as: NIZORAL  APPLY 1 APPLICATION TOPICALLY 2 (TWO) TIMES A WEEK.   ketoconazole  2 % cream Commonly known as: NIZORAL  Apply 1 application topically daily.   levothyroxine  125 MCG tablet Commonly known as: SYNTHROID  Take 1 tablet (125 mcg total) by mouth daily.   losartan -hydrochlorothiazide 100-25 MG tablet Commonly known as: HYZAAR Take 1 tablet by mouth daily.   metFORMIN  750 MG 24 hr tablet Commonly known as: GLUCOPHAGE -XR Take 1 tablet (750 mg total) by mouth in the morning and at bedtime.   mirabegron  ER 50 MG Tb24 tablet Commonly known as: MYRBETRIQ  Take 1 tablet (50 mg total) by mouth daily.   naltrexone  50 MG tablet Commonly known as: DEPADE Take 0.5-1 tablets (25-50 mg total) by mouth  daily.   pantoprazole  40 MG tablet Commonly known as: PROTONIX  Take 1 tablet (40 mg total) by mouth daily.   polyethylene glycol powder 17 GM/SCOOP powder Commonly known as: GLYCOLAX/MIRALAX Take 17 g by mouth at bedtime.   rosuvastatin  20 MG tablet Commonly known as: CRESTOR  Take 1 tablet (20 mg total) by mouth daily.   tirzepatide  2.5 MG/0.5ML injection vial Commonly known as: ZEPBOUND  Inject 2.5 mg into the skin once a week.   trazodone  300 MG tablet Commonly known as: DESYREL  Take 1 tablet (300 mg total) by mouth at bedtime.   venlafaxine  XR 150 MG 24 hr capsule Commonly known as: EFFEXOR -XR TAKE 2 CAPSULES (300 MG TOTAL) BY MOUTH DAILY WITH BREAKFAST   Vitamin D  (Ergocalciferol ) 1.25 MG (50000 UNIT) Caps capsule Commonly known as: DRISDOL  Take 1 capsule (50,000 Units  total) by mouth every 7 (seven) days. AFTER finishing this bottle, start OTC vitamin D  2000 units daily   Vitamin D3 50 MCG (2000 UT) capsule Take 4,000 Units by mouth daily.        Allergies:  Allergies  Allergen Reactions   Shellfish Allergy Hives    Family History: Family History  Problem Relation Age of Onset   Lung cancer Mother    Hypertension Mother    Alcohol abuse Mother    Anxiety disorder Mother    Hypertension Father    Arthritis Father    Alcohol abuse Father    Esophageal cancer Father    Alcohol abuse Brother    Hypertension Brother    Autism Son    Coronary artery disease Maternal Grandfather    Heart attack Maternal Grandfather    CVA Paternal Grandmother    Skin cancer Paternal Grandmother    Cancer Maternal Grandmother     Social History:  reports that she has never smoked. She has never used smokeless tobacco. She reports that she does not drink alcohol and does not use drugs.  ROS:                                        Physical Exam: BP (!) 154/85   Pulse 80   LMP 08/06/2018   Constitutional:  Alert and oriented, No acute  distress. HEENT: [place] AT, moist mucus membranes.  Trachea midline, no masses.   Laboratory Data: Lab Results  Component Value Date   WBC 3.4 01/15/2023   HGB 13.6 01/15/2023   HCT 41.1 01/15/2023   MCV 87 01/15/2023   PLT 188 01/15/2023    Lab Results  Component Value Date   CREATININE 1.02 (H) 02/12/2024    No results found for: PSA  No results found for: TESTOSTERONE  Lab Results  Component Value Date   HGBA1C 6.1 (H) 02/12/2024    Urinalysis    Component Value Date/Time   APPEARANCEUR Hazy (A) 10/14/2023 0858   GLUCOSEU Negative 10/14/2023 0858   BILIRUBINUR Negative 10/14/2023 0858   KETONESUR trace (5) (A) 06/03/2022 1147   PROTEINUR Trace (A) 10/14/2023 0858   UROBILINOGEN 1.0 07/11/2022 1055   NITRITE Positive (A) 10/14/2023 0858   LEUKOCYTESUR 2+ (A) 10/14/2023 0858    Pertinent Imaging: Urine reviewed and sent for culture  Assessment & Plan: Patient has mixed incontinence.  I was a bit surprised of the urodynamic findings.  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  In my opinion she would be at significant risk of retention if she had a sling.  If her stress incontinence is treated I would offer a bulking agent.  In spite of the leak point pressures I will treat her with the OAB pathway.  Call if culture positive.  Reassess on Gemtesa  samples and prescription.  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  Glendia DELENA Elizabeth, MD  [facility] [address] [place], [place] [address] [phone]

## 2024-09-07 NOTE — Therapy (Signed)
 OUTPATIENT PHYSICAL THERAPY KNEE TREATMENT  Patient Name: Patricia Deleon MRN: [id number] DOB:May 15, 1961, 63 y.o., female Today's Date: 09/07/2024  END OF SESSION:  PT End of Session - 09/07/24 1518     Visit Number 12    Number of Visits 17    Date for PT Re-Evaluation 09/29/24    PT Start Time 1516    PT Stop Time 1600    PT Time Calculation (min) 44 min    Activity Tolerance Patient tolerated treatment well    Behavior During Therapy WFL for tasks assessed/performed            Past Medical History:  Diagnosis Date   Agoraphobia with panic disorder    Anemia    Depression    Erosive gastritis    Fundic gland polyps of stomach, benign    Geographic tongue    GERD (gastroesophageal reflux disease)    History of esophagogastroduodenoscopy (EGD)    Hypercholesterolemia    Hypertension    Intervertebral disc disorder    thoracic,thoracolumber,lumbosacral   Multiple sclerosis (HCC)    Obesity    Sleep apnea    Past Surgical History:  Procedure Laterality Date   COLONOSCOPY     COLONOSCOPY WITH PROPOFOL  N/A 07/07/2018   Procedure: COLONOSCOPY WITH PROPOFOL ;  Surgeon: Gaylyn Gladis PENNER, MD;  Location: [facility] ENDOSCOPY;  Service: Endoscopy;  Laterality: N/A;   ESOPHAGOGASTRODUODENOSCOPY (EGD) WITH PROPOFOL  N/A 01/31/2016   Procedure: ESOPHAGOGASTRODUODENOSCOPY (EGD) WITH PROPOFOL ;  Surgeon: Gladis PENNER Gaylyn, MD;  Location: [facility] ENDOSCOPY;  Service: Endoscopy;  Laterality: N/A;   IR FLUORO GUIDE CV LINE RIGHT  10/06/2018   IR REMOVAL TUN CV CATH W/O FL  10/15/2018   IR US  GUIDE VASC ACCESS RIGHT  10/06/2018   NISSEN FUNDOPLICATION     Patient Active Problem List   Diagnosis Date Noted   Chronic kidney disease, stage 2, mildly decreased GFR 02/12/2024   Annual physical exam 02/12/2024   Prediabetes 01/15/2023   Morbid obesity (HCC) 01/15/2023   Mixed hyperlipidemia 01/15/2023   Depression, recurrent (HCC) 01/15/2023   Urinary tract infection, recurrent 07/11/2022    UTI symptoms 06/14/2022   Moderate episode of recurrent major depressive disorder (HCC) 05/28/2019   Excessive daytime sleepiness 08/25/2018   Multiple sclerosis (HCC) 07/08/2018   Neck pain 07/08/2018   Transient vision disturbance of left eye 06/18/2018   Urge incontinence 06/18/2018   Numbness 06/18/2018   Acne rosacea 05/07/2016   Gastroesophageal reflux disease without esophagitis 09/28/2015   Displacement of lumbar intervertebral disc without myelopathy 09/28/2015   Insomnia 09/28/2015   Thoracic, 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   Adiposity 09/28/2015   Restless leg 09/28/2015   Severe obstructive sleep apnea 09/28/2015   Vitamin D  deficiency 09/28/2015   Hypothyroidism 07/11/2015   Hypertension 06/09/2015    PCP: Donzella Lauraine SAILOR, DO  REFERRING PROVIDER: Donzella Lauraine SAILOR, DO  REFERRING DIAG:  [phone] (ICD-10-CM) - Acute pain of left knee  G35 (ICD-10-CM) - Multiple sclerosis (HCC)    RATIONALE FOR EVALUATION AND TREATMENT: Rehabilitation  THERAPY DIAG: Muscle weakness (generalized)  Acute pain of left knee  Stiffness of left knee, not elsewhere classified  ONSET DATE: Chronic   FOLLOW-UP APPT SCHEDULED WITH REFERRING PROVIDER: Yes August 21, 2024    SUBJECTIVE:  SUBJECTIVE STATEMENT:    Patient reporting to OPPT with a c/c of L knee pain.   PERTINENT HISTORY:   Ms. Rylah Fukuda is a 63 y.o. patient arriving to OPPT with L knee pain. She denies MOI or trauma to the knee. She reports that her knee will lock then pop with a relieving sensation. Additionally she reports that restricted ROM in her L knee compared to R knee. Patient reports aggravating factors: stairway navigation (both directions), sleeping at night. At night she  describes as the pain worsening as it radiates from the L knee distal into the ankle/foot and intermittently it will travel to the L hip. Relieving factors include: daily supplements, gabapentin , ice modalities.   Red flags: chills/fever, night sweats, unexplained weight gain/loss, unrelenting pain  PAIN:   Pain Intensity: Present: 0/10, Best: 0/10, Worst: 8/10 Pain location: L knee Pain quality: intermittent, aching, and throbbing  Radiating pain: Yes  Swelling: No  Popping, catching, locking: Yes  Numbness/Tingling: No Focal weakness or buckling: Yes 24-hour pain behavior: PM - Activity Dependent  History of prior back, hip, or knee injury, pain, surgery, or therapy: Yes  PRECAUTIONS: Fall, Low   WEIGHT BEARING RESTRICTIONS: No  FALLS: Has patient fallen in last 6 months? No  Living Environment Lives with: lives with their family Husband, Daughter, Son, Darden (3 yr old), 3 Dogs Lives in: House/apartment Stairs: Yes: Internal: 12-14 steps; on right going up Has following equipment at home: None  Prior level of function: Independent  Occupational demands: Works at [employer] (M-F, some Sat, 8-6)  Hobbies: None   Patient Goals: Patient would like to be able to perform stairway navigation without pain   OBJECTIVE:   Patient Surveys  LEFS: 28/80 = 35%    Cognition Cognition is within normal limits.     Gross Musculoskeletal Assessment Tremor: None Bulk: Normal Tone: Normal  GAIT: Distance walked: 6m Assistive device utilized: None Level of assistance: Complete Independence Comments: Reciprocal Gait, decreased velocity   Posture: Sitting: WNL Standing: WNL    AROM AROM (Normal range in degrees) AROM  Hip Right Left  Flexion (125) 90   Extension (15)    Abduction (40)    Adduction     Internal Rotation (45)    External Rotation (45)        Knee    Flexion (135) 110 110 *   Extension (0) 0 0      Ankle    Dorsiflexion (20)     Plantarflexion (50)    Inversion (35)    Eversion (15    (* = pain; Blank rows = not tested)  LE MMT: MMT (out of 5) Right Left  Hip flexion 4 4  Hip extension    Hip abduction (Seated) 4+ 4+  Hip adduction 5 5  Hip internal rotation    Hip external rotation    Knee flexion 4 4  Knee extension 4 4*  Ankle dorsiflexion 5 5  Ankle plantarflexion 5 5  Ankle inversion    Ankle eversion    (* = pain; Blank rows = not tested)  Sensation Grossly intact to light touch bilateral LEs as determined by testing dermatomes L2-S2. Proprioception, and hot/cold testing deferred on this date.  Reflexes R/L Knee Jerk (L3/4): 2+/2+  Ankle Jerk (S1/2): 2+/2+   Muscle Length Hamstrings: R: Not examined L: Negative Quadriceps Arvid): R: Not examined L: Not examined   Palpation Location LEFT  RIGHT  Quadriceps 0   Medial Hamstrings 0   Lateral Hamstrings 0   Lateral Hamstring tendon 0   Medial Hamstring tendon 0   Quadriceps tendon 0   Patella    Patellar Tendon    Tibial Tuberosity    Medial joint line  2*    Lateral joint line 1   MCL 1   LCL    Adductor Tubercle    Pes Anserine tendon 1   Infrapatellar fat pad    Fibular head    Popliteal fossa    (Blank rows = not tested) Graded on 0-4 scale (0 = no pain, 1 = pain, 2 = pain with wincing/grimacing/flinching, 3 = pain with withdrawal, 4 = unwilling to allow palpation), (Blank rows = not tested)  Passive Accessory Motion Deferred  VASCULAR Dorsalis pedis and posterior tibial pulses are palpable   SPECIAL TESTS  Ligamentous Stability  MCL: Valgus Stress (30 degrees flexion): R: Not examined L: Negative  LCL: Varus Stress (30 degrees flexion): R: Not examined L: Negative  Meniscus Tests McMurray's Test:  Medial Meniscus (Tibial ER): R: Not examined L: Positive for Pain with ER  Lateral Meniscus (Tibial IR): R: Not examined L: Not examined   Patellar Tendinopathy Inferior pole palpation with anterior  tilt: R: Not examined L: Negative  Functional Testing:  Squat: Deferred Sit to Stand: WNL, Reliant on UE in order to control descent Lateral Step Down: Deferred   : 13.99s 5TSTS: 16.15s   TODAY'S TREATMENT: DATE: 09/07/24  Subjective: Patient reports 1/10 in bilateral knees (L > R). She reports that she returned to work and has walked at an increased distance. She walked at [employer] and [employer] this past weekend. No further questions or concerns.   Therapeutic Exercise:   Seated Knee Extension   1 x 10 - 15#    1 x 10 - 25# 1 x 10 - 30#     Seated Hamstring Curl   1 x 10 - 30#   1 x 10 - 35#   1 x 10 - 40#   Therapeutic Activity:   Sled Push for glute strength and LE strength   2 x 10 - 25#    2 x 10 - 45#    Kettle Bell Squats for squatting capacity, LE strength and transferring    3 x 10 - 20#      Standing Calve Raise/Stretch at 1st stair step - BUE Support    2 x 15 - for calve strengthening to optimize stairs/walking    Sit to Stand from Chair without arm rest in order to mimic lower couch at home   3x - Educated on proper technique with trunk flexion and UE support    Lateral Lunges - TRX    1 x 10 - L Lunge    1 x 8 - R Lunge   PATIENT EDUCATION:  Education details: HEP, Exercise Technique  Person educated: Patient Education method: Explanation, Demonstration, and Handouts Education comprehension: verbalized understanding and returned demonstration   HOME EXERCISE PROGRAM:  Access Code: [email or web address] URL: [email or web address] Date: 08/25/2024 Prepared by: Lonni Pall  Exercises - Supine Heel Slide  - 1 x daily - 3-4 x weekly - 2-3 sets - 10-15 reps - Supine Active Straight Leg Raise  - 1 x daily - 3-4 x weekly - 2-3 sets - 8-10 reps - 5 hold - Long Sitting Quad Set with Towel Roll Under Heel  - 1 x daily - 3-4 x weekly - 2-3 sets -  8-10 reps - Seated Long Arc Quad  - 1 x daily - 7 x weekly - 2-3 sets - 10-15 reps - 3s hold -  Mini Squat  - 1 x daily - 3-4 x weekly - 2-3 sets - 8-10 reps - 5s hold - Forward Lunge with Back Leg Straight and Counter Support  - 1 x daily - 3-4 x weekly - 2-3 sets - 8-10 reps - Seated Hamstring Stretch  - 1 x daily - 7 x weekly - 3 sets - 15-30s hold - Supine Quadriceps Stretch with Strap on Table  - 1 x daily - 7 x weekly - 2-3 sets - 15-30s hold - Heel Raises with Counter Support  - 1 x daily - 7 x weekly - 2-3 sets - 10 reps - Forward Step Down with Counter Support at Side  - 1 x daily - 7 x weekly - 2-3 sets - 8-10 reps  Access Code: [email or web address] URL: [email or web address] Date: 08/06/2024 Prepared by: Lonni Pall  Exercises - Supine Heel Slide  - 1 x daily - 3-4 x weekly - 2-3 sets - 10-15 reps - Supine Active Straight Leg Raise  - 1 x daily - 3-4 x weekly - 2-3 sets - 8-10 reps - 5 hold - Long Sitting Quad Set with Towel Roll Under Heel  - 1 x daily - 3-4 x weekly - 2-3 sets - 8-10 reps - Seated Long Arc Quad  - 1 x daily - 7 x weekly - 2-3 sets - 10-15 reps - 3s hold - Mini Squat  - 1 x daily - 3-4 x weekly - 2-3 sets - 8-10 reps - 5s hold - Forward Lunge with Back Leg Straight and Counter Support  - 1 x daily - 3-4 x weekly - 2-3 sets - 8-10 reps - Seated Hamstring Stretch  - 1 x daily - 7 x weekly - 3 sets - 15-30s hold - Supine Quadriceps Stretch with Strap on Table  - 1 x daily - 7 x weekly - 2-3 sets - 15-30s hold  ASSESSMENT:  CLINICAL IMPRESSION: Continued PT POC with main focus on LE strength and functional activities. Patient able to transfer from 18 chair without arm rest without pain. Good tolerance to PT interventions but moderate fatigue towards end of the session; lateral lunges with minor pain in the front of the knee. Seated rest breaks throughout session in order to mitigate fatigue. PT to continue focusing on functional strengthening (stairs, curbs, transfers) within tolerance in order to optimize daily/work related movements. Based on today's  performance, patient will continue to benefit from skilled PT in order to address current deficits and maximize return to PLOF.   OBJECTIVE IMPAIRMENTS: decreased activity tolerance, decreased ROM, decreased strength, obesity, and pain.   ACTIVITY LIMITATIONS: lifting, bending, sitting, standing, squatting, and stairs  PARTICIPATION LIMITATIONS: driving and occupation  PERSONAL FACTORS: Age, Past/current experiences, Time since onset of injury/illness/exacerbation, and 3+ comorbidities: Multiple Sclerosis, Chronic Kidney Disease, Obesity, etc. are also affecting patient's functional outcome.   REHAB POTENTIAL: Good  CLINICAL DECISION MAKING: Evolving/moderate complexity  EVALUATION COMPLEXITY: Moderate   GOALS: Goals reviewed with patient? Yes  SHORT TERM GOALS: Target date: 10/05/2024  Pt will be independent with HEP to improve strength and decrease knee pain to improve pain-free function at home and work. Baseline: 07/07/2024: Initial HEP  Goal status: INITIAL   LONG TERM GOALS: Target date: 11/02/2024  Patient will be able to safely navigate a flight of 12 stairs, using proper foot  placement and handrail support, without requiring assistance from PT in order to demonstrate significant improvement in LE strength and safety. Baseline: 07/07/2024: Pain with ascending/descending with LLE; 09/01/24: Performing reciprocally and with hand rail without external support. Goal status: MET  2.  Pt will decrease worst knee pain by at least 3 points on the NPRS in order to demonstrate clinically significant reduction in knee pain. Baseline: 07/07/2024: 8/10 NPS; 09/01/24: 09/01/24: 0/10 NPS for the last week Goal status: MET  3.  Pt will increase LEFS score by at least 9 points in order demonstrate clinically significant reduction in knee pain/disability.       Baseline: 07/07/2024: 28/80 = 35%; 09/03/2024 36/80 = 45%;   Goal status: ON GOING   4.  Pt will increase strength of knee  flexion/extension by at least 1/2 MMT grade and without pain in order to demonstrate improvement in strength and function  Baseline: 07/07/2024: L Knee Flex/Ext: 4/5, 4/5*; 09/01/24: 5/5 for knee extension and flexion and no pain Goal status: MET  5.  Pt will decrease 5TSTS by at least 3 seconds in order to demonstrate clinically significant improvement in LE strength Baseline: 07/07/2024: 16.15s; 9.36 seconds   Goal status: MET  6.  Pt will increase by at least 0.13 m/s in order to demonstrate clinically significant improvement in community ambulation.  Baseline: 07/07/2024: .71 m/s (Limited Community Ambulator); 09/01/24: .83 m/s self selected; 1.0 m/s fast speed Goal status: PARTIALLY MET  7. Pt will improve 30 second STS test to 15 reps or greater to meet age and sex matched norms for muscular strength/endurance for ADL completion.      Baseline: 09/01/24: 14 reps     Goal status: Initial   PLAN: PT FREQUENCY: 1-2x/week  PT DURATION: 4 weeks  PLANNED INTERVENTIONS: Therapeutic exercises, Therapeutic activity, Neuromuscular re-education, Balance training, Gait training, Patient/Family education, Self Care, Joint mobilization, Joint manipulation, Vestibular training, Canalith repositioning, Orthotic/Fit training, DME instructions, Dry Needling, Electrical stimulation, Spinal manipulation, Spinal mobilization, Cryotherapy, Moist heat, Taping, Traction, Manual therapy, and Re-evaluation.  PLAN FOR NEXT SESSION: Functional strengthening (STS, Stairs, Curbs)    Lonni Pall PT, DPT Physical Therapist- [place]  09/07/2024, 3:19 PM

## 2024-09-09 ENCOUNTER — Ambulatory Visit

## 2024-09-09 DIAGNOSIS — M6281 Muscle weakness (generalized): Secondary | ICD-10-CM | POA: Diagnosis not present

## 2024-09-09 DIAGNOSIS — M25562 Pain in left knee: Secondary | ICD-10-CM

## 2024-09-09 DIAGNOSIS — M25662 Stiffness of left knee, not elsewhere classified: Secondary | ICD-10-CM

## 2024-09-09 NOTE — Therapy (Signed)
 OUTPATIENT PHYSICAL THERAPY KNEE TREATMENT  Patient Name: Patricia Deleon MRN: [id number] DOB:[birth year], 63 y.o., female Today's Date: 09/09/2024  END OF SESSION:  PT End of Session - 09/09/24 1301     Visit Number 13    Number of Visits 17    Date for PT Re-Evaluation 09/29/24    PT Start Time 1301    PT Stop Time 1345    PT Time Calculation (min) 44 min    Activity Tolerance Patient tolerated treatment well    Behavior During Therapy WFL for tasks assessed/performed            Past Medical History:  Diagnosis Date   Agoraphobia with panic disorder    Anemia    Depression    Erosive gastritis    Fundic gland polyps of stomach, benign    Geographic tongue    GERD (gastroesophageal reflux disease)    History of esophagogastroduodenoscopy (EGD)    Hypercholesterolemia    Hypertension    Intervertebral disc disorder    thoracic,thoracolumber,lumbosacral   Multiple sclerosis (HCC)    Obesity    Sleep apnea    Past Surgical History:  Procedure Laterality Date   COLONOSCOPY     COLONOSCOPY WITH PROPOFOL  N/A 07/07/2018   Procedure: COLONOSCOPY WITH PROPOFOL ;  Surgeon: Gaylyn Gladis PENNER, MD;  Location: [facility] ENDOSCOPY;  Service: Endoscopy;  Laterality: N/A;   ESOPHAGOGASTRODUODENOSCOPY (EGD) WITH PROPOFOL  N/A 01/31/2016   Procedure: ESOPHAGOGASTRODUODENOSCOPY (EGD) WITH PROPOFOL ;  Surgeon: Gladis PENNER Gaylyn, MD;  Location: [facility] ENDOSCOPY;  Service: Endoscopy;  Laterality: N/A;   IR FLUORO GUIDE CV LINE RIGHT  10/06/2018   IR REMOVAL TUN CV CATH W/O FL  10/15/2018   IR US  GUIDE VASC ACCESS RIGHT  10/06/2018   NISSEN FUNDOPLICATION     Patient Active Problem List   Diagnosis Date Noted   Chronic kidney disease, stage 2, mildly decreased GFR 02/12/2024   Annual physical exam 02/12/2024   Prediabetes 01/15/2023   Morbid obesity (HCC) 01/15/2023   Mixed hyperlipidemia 01/15/2023   Depression, recurrent (HCC) 01/15/2023   Urinary tract infection, recurrent 07/11/2022    UTI symptoms 06/14/2022   Moderate episode of recurrent major depressive disorder (HCC) 05/28/2019   Excessive daytime sleepiness 08/25/2018   Multiple sclerosis (HCC) 07/08/2018   Neck pain 07/08/2018   Transient vision disturbance of left eye 06/18/2018   Urge incontinence 06/18/2018   Numbness 06/18/2018   Acne rosacea 05/07/2016   Gastroesophageal reflux disease without esophagitis 09/28/2015   Displacement of lumbar intervertebral disc without myelopathy 09/28/2015   Insomnia 09/28/2015   Thoracic, 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   Adiposity 09/28/2015   Restless leg 09/28/2015   Severe obstructive sleep apnea 09/28/2015   Vitamin D  deficiency 09/28/2015   Hypothyroidism 07/11/2015   Hypertension 06/09/2015    PCP: Donzella Lauraine SAILOR, DO  REFERRING PROVIDER: Donzella Lauraine SAILOR, DO  REFERRING DIAG:  [phone] (ICD-10-CM) - Acute pain of left knee  G35 (ICD-10-CM) - Multiple sclerosis (HCC)    RATIONALE FOR EVALUATION AND TREATMENT: Rehabilitation  THERAPY DIAG: Muscle weakness (generalized)  Acute pain of left knee  Stiffness of left knee, not elsewhere classified  ONSET DATE: Chronic   FOLLOW-UP APPT SCHEDULED WITH REFERRING PROVIDER: Yes August 21, 2024    SUBJECTIVE:  SUBJECTIVE STATEMENT:    Patient reporting to OPPT with a c/c of L knee pain.   PERTINENT HISTORY:   Patricia Deleon is a 63 y.o. patient arriving to OPPT with L knee pain. She denies MOI or trauma to the knee. She reports that her knee will lock then pop with a relieving sensation. Additionally she reports that restricted ROM in her L knee compared to R knee. Patient reports aggravating factors: stairway navigation (both directions), sleeping at night. At night she  describes as the pain worsening as it radiates from the L knee distal into the ankle/foot and intermittently it will travel to the L hip. Relieving factors include: daily supplements, gabapentin , ice modalities.   Red flags: chills/fever, night sweats, unexplained weight gain/loss, unrelenting pain  PAIN:   Pain Intensity: Present: 0/10, Best: 0/10, Worst: 8/10 Pain location: L knee Pain quality: intermittent, aching, and throbbing  Radiating pain: Yes  Swelling: No  Popping, catching, locking: Yes  Numbness/Tingling: No Focal weakness or buckling: Yes 24-hour pain behavior: PM - Activity Dependent  History of prior back, hip, or knee injury, pain, surgery, or therapy: Yes  PRECAUTIONS: Fall, Low   WEIGHT BEARING RESTRICTIONS: No  FALLS: Has patient fallen in last 6 months? No  Living Environment Lives with: lives with their family Husband, Daughter, Son, Darden (3 yr old), 3 Dogs Lives in: House/apartment Stairs: Yes: Internal: 12-14 steps; on right going up Has following equipment at home: None  Prior level of function: Independent  Occupational demands: Works at [employer] (M-F, some Sat, 8-6)  Hobbies: None   Patient Goals: Patient would like to be able to perform stairway navigation without pain   OBJECTIVE:   Patient Surveys  LEFS: 28/80 = 35%    Cognition Cognition is within normal limits.     Gross Musculoskeletal Assessment Tremor: None Bulk: Normal Tone: Normal  GAIT: Distance walked: 24m Assistive device utilized: None Level of assistance: Complete Independence Comments: Reciprocal Gait, decreased velocity   Posture: Sitting: WNL Standing: WNL    AROM AROM (Normal range in degrees) AROM  Hip Right Left  Flexion (125) 90   Extension (15)    Abduction (40)    Adduction     Internal Rotation (45)    External Rotation (45)        Knee    Flexion (135) 110 110 *   Extension (0) 0 0      Ankle    Dorsiflexion (20)     Plantarflexion (50)    Inversion (35)    Eversion (15    (* = pain; Blank rows = not tested)  LE MMT: MMT (out of 5) Right Left  Hip flexion 4 4  Hip extension    Hip abduction (Seated) 4+ 4+  Hip adduction 5 5  Hip internal rotation    Hip external rotation    Knee flexion 4 4  Knee extension 4 4*  Ankle dorsiflexion 5 5  Ankle plantarflexion 5 5  Ankle inversion    Ankle eversion    (* = pain; Blank rows = not tested)  Sensation Grossly intact to light touch bilateral LEs as determined by testing dermatomes L2-S2. Proprioception, and hot/cold testing deferred on this date.  Reflexes R/L Knee Jerk (L3/4): 2+/2+  Ankle Jerk (S1/2): 2+/2+   Muscle Length Hamstrings: R: Not examined L: Negative Quadriceps Arvid): R: Not examined L: Not examined   Palpation Location LEFT  RIGHT  Quadriceps 0   Medial Hamstrings 0   Lateral Hamstrings 0   Lateral Hamstring tendon 0   Medial Hamstring tendon 0   Quadriceps tendon 0   Patella    Patellar Tendon    Tibial Tuberosity    Medial joint line  2*    Lateral joint line 1   MCL 1   LCL    Adductor Tubercle    Pes Anserine tendon 1   Infrapatellar fat pad    Fibular head    Popliteal fossa    (Blank rows = not tested) Graded on 0-4 scale (0 = no pain, 1 = pain, 2 = pain with wincing/grimacing/flinching, 3 = pain with withdrawal, 4 = unwilling to allow palpation), (Blank rows = not tested)  Passive Accessory Motion Deferred  VASCULAR Dorsalis pedis and posterior tibial pulses are palpable   SPECIAL TESTS  Ligamentous Stability  MCL: Valgus Stress (30 degrees flexion): R: Not examined L: Negative  LCL: Varus Stress (30 degrees flexion): R: Not examined L: Negative  Meniscus Tests McMurray's Test:  Medial Meniscus (Tibial ER): R: Not examined L: Positive for Pain with ER  Lateral Meniscus (Tibial IR): R: Not examined L: Not examined   Patellar Tendinopathy Inferior pole palpation with anterior  tilt: R: Not examined L: Negative  Functional Testing:  Squat: Deferred Sit to Stand: WNL, Reliant on UE in order to control descent Lateral Step Down: Deferred   : 13.99s 5TSTS: 16.15s   TODAY'S TREATMENT: DATE: 09/09/24  Subjective: Patient feels good today; a little bit of pain. Today she reports that her knee still feels full. She reports that her leg cramps have completely ceased; additionally she is able to lay on her L side without adverse effects. No questions or concerns.  Therapeutic Exercise:   Seated Knee Extension   1 x 10 - 25#    1 x 10 - 30#    1 x 10 - 35# - Mod to max effort    Seated Hamstring Curl   1 x 10 - 35#   2 x 10 - 40#   Standing Hip Abduction  R/L: 3 x 10 - 25#   Therapeutic Activity:   NuStep L6-2 x 5 min x LE (Seat 10) for LE endurance and strength; PT manually adjusted resistance throughout bout.    Static Lateral Lunges - TRX for knee stability and adductor strength    2 x 10 - L Lunge     2 x 10 - R Lunge   Sled Push for glute strength and LE strength   2 x 10 - 25#    2 x 10 -  50#   Kettle Bell Sumo Squats   2 x 10 - 20# KB      Standing Calve Raise/Stretch at 1st stair step - BUE Support    2 x 15 - for calve strengthening to optimize stairs/walking     Forward Step Down - BUE Support    R/L: 2 x 10 - no pain with step  PATIENT EDUCATION:  Education details: HEP, Exercise Technique  Person educated: Patient Education method: Explanation, Demonstration, and Handouts Education comprehension: verbalized understanding and returned demonstration   HOME EXERCISE PROGRAM:  Access Code: [email or web address] URL: [email or web address] Date: 08/25/2024 Prepared by: Lonni Pall  Exercises - Supine Heel Slide  - 1 x daily - 3-4 x weekly - 2-3 sets - 10-15 reps - Supine Active Straight Leg Raise  - 1 x daily - 3-4 x weekly -  2-3 sets - 8-10 reps - 5 hold - Long Sitting Quad Set with Towel Roll Under Heel  - 1 x  daily - 3-4 x weekly - 2-3 sets - 8-10 reps - Seated Long Arc Quad  - 1 x daily - 7 x weekly - 2-3 sets - 10-15 reps - 3s hold - Mini Squat  - 1 x daily - 3-4 x weekly - 2-3 sets - 8-10 reps - 5s hold - Forward Lunge with Back Leg Straight and Counter Support  - 1 x daily - 3-4 x weekly - 2-3 sets - 8-10 reps - Seated Hamstring Stretch  - 1 x daily - 7 x weekly - 3 sets - 15-30s hold - Supine Quadriceps Stretch with Strap on Table  - 1 x daily - 7 x weekly - 2-3 sets - 15-30s hold - Heel Raises with Counter Support  - 1 x daily - 7 x weekly - 2-3 sets - 10 reps - Forward Step Down with Counter Support at Side  - 1 x daily - 7 x weekly - 2-3 sets - 8-10 reps  Access Code: [email or web address] URL: [email or web address] Date: 08/06/2024 Prepared by: Lonni Pall  Exercises - Supine Heel Slide  - 1 x daily - 3-4 x weekly - 2-3 sets - 10-15 reps - Supine Active Straight Leg Raise  - 1 x daily - 3-4 x weekly - 2-3 sets - 8-10 reps - 5 hold - Long Sitting Quad Set with Towel Roll Under Heel  - 1 x daily - 3-4 x weekly - 2-3 sets - 8-10 reps - Seated Long Arc Quad  - 1 x daily - 7 x weekly - 2-3 sets - 10-15 reps - 3s hold - Mini Squat  - 1 x daily - 3-4 x weekly - 2-3 sets - 8-10 reps - 5s hold - Forward Lunge with Back Leg Straight and Counter Support  - 1 x daily - 3-4 x weekly - 2-3 sets - 8-10 reps - Seated Hamstring Stretch  - 1 x daily - 7 x weekly - 3 sets - 15-30s hold - Supine Quadriceps Stretch with Strap on Table  - 1 x daily - 7 x weekly - 2-3 sets - 15-30s hold  ASSESSMENT:  CLINICAL IMPRESSION:  Continued PT POC in management of L Knee pain. Pt vocalized concerns about new symptoms of fullness in her L knee and ankle; PT advised to perform light exercise and speak with PCP regarding new symptoms. Good tolerance to all interventions; able to perform forward step down without knee pain. Knee strength/stability have improved and transferred into greater success with functional  activities (stairs/walking). PT to continue functional strengthening in order to improve curb/stair navigation. Based on today's performance, patient will continue to benefit from skilled PT in order to address current deficits and maximize return to PLOF.   OBJECTIVE IMPAIRMENTS: decreased activity tolerance, decreased ROM, decreased strength, obesity, and pain.   ACTIVITY LIMITATIONS: lifting, bending, sitting, standing, squatting, and stairs  PARTICIPATION LIMITATIONS: driving and occupation  PERSONAL FACTORS: Age, Past/current experiences, Time since onset of injury/illness/exacerbation, and 3+ comorbidities: Multiple Sclerosis, Chronic Kidney Disease, Obesity, etc. are also affecting patient's functional outcome.   REHAB POTENTIAL: Good  CLINICAL DECISION MAKING: Evolving/moderate complexity  EVALUATION COMPLEXITY: Moderate   GOALS: Goals reviewed with patient? Yes  SHORT TERM GOALS: Target date: 10/07/2024  Pt will be independent with HEP to improve strength and decrease knee pain to improve pain-free function at home and work. Baseline: 07/07/2024: Initial  HEP  Goal status: INITIAL   LONG TERM GOALS: Target date: 11/04/2024  Patient will be able to safely navigate a flight of 12 stairs, using proper foot placement and handrail support, without requiring assistance from PT in order to demonstrate significant improvement in LE strength and safety. Baseline: 07/07/2024: Pain with ascending/descending with LLE; 09/01/24: Performing reciprocally and with hand rail without external support. Goal status: MET  2.  Pt will decrease worst knee pain by at least 3 points on the NPRS in order to demonstrate clinically significant reduction in knee pain. Baseline: 07/07/2024: 8/10 NPS; 09/01/24: 09/01/24: 0/10 NPS for the last week Goal status: MET  3.  Pt will increase LEFS score by at least 9 points in order demonstrate clinically significant reduction in knee pain/disability.        Baseline: 07/07/2024: 28/80 = 35%; 09/03/2024 36/80 = 45%;   Goal status: ON GOING   4.  Pt will increase strength of knee flexion/extension by at least 1/2 MMT grade and without pain in order to demonstrate improvement in strength and function  Baseline: 07/07/2024: L Knee Flex/Ext: 4/5, 4/5*; 09/01/24: 5/5 for knee extension and flexion and no pain Goal status: MET  5.  Pt will decrease 5TSTS by at least 3 seconds in order to demonstrate clinically significant improvement in LE strength Baseline: 07/07/2024: 16.15s; 9.36 seconds   Goal status: MET  6.  Pt will increase by at least 0.13 m/s in order to demonstrate clinically significant improvement in community ambulation.  Baseline: 07/07/2024: .71 m/s (Limited Community Ambulator); 09/01/24: .83 m/s self selected; 1.0 m/s fast speed Goal status: PARTIALLY MET  7. Pt will improve 30 second STS test to 15 reps or greater to meet age and sex matched norms for muscular strength/endurance for ADL completion.      Baseline: 09/01/24: 14 reps     Goal status: Initial   PLAN: PT FREQUENCY: 1-2x/week  PT DURATION: 4 weeks  PLANNED INTERVENTIONS: Therapeutic exercises, Therapeutic activity, Neuromuscular re-education, Balance training, Gait training, Patient/Family education, Self Care, Joint mobilization, Joint manipulation, Vestibular training, Canalith repositioning, Orthotic/Fit training, DME instructions, Dry Needling, Electrical stimulation, Spinal manipulation, Spinal mobilization, Cryotherapy, Moist heat, Taping, Traction, Manual therapy, and Re-evaluation.  PLAN FOR NEXT SESSION: Functional strengthening (STS, Stairs, Curbs)   Lonni Pall PT, DPT Physical Therapist- [place]  09/09/2024, 1:01 PM

## 2024-09-11 LAB — CULTURE, URINE COMPREHENSIVE

## 2024-09-14 ENCOUNTER — Ambulatory Visit: Payer: Self-pay

## 2024-09-14 DIAGNOSIS — N390 Urinary tract infection, site not specified: Secondary | ICD-10-CM

## 2024-09-14 MED ORDER — NITROFURANTOIN MACROCRYSTAL 100 MG PO CAPS: 100.0000 mg | ORAL_CAPSULE | Freq: Two times a day (BID) | ORAL | 0 refills | Status: AC

## 2024-09-15 NOTE — Therapy (Signed)
 OUTPATIENT PHYSICAL THERAPY KNEE TREATMENT  Patient Name: Patricia Deleon MRN: [id number] DOB:Mar 28, 1961, 63 y.o., female Today's Date: 09/16/2024  END OF SESSION:  PT End of Session - 09/16/24 1121     Visit Number 14    Number of Visits 17    Date for PT Re-Evaluation 09/29/24    PT Start Time 1118    PT Stop Time 1158    PT Time Calculation (min) 40 min    Activity Tolerance Patient tolerated treatment well    Behavior During Therapy WFL for tasks assessed/performed             Past Medical History:  Diagnosis Date   Agoraphobia with panic disorder    Anemia    Depression    Erosive gastritis    Fundic gland polyps of stomach, benign    Geographic tongue    GERD (gastroesophageal reflux disease)    History of esophagogastroduodenoscopy (EGD)    Hypercholesterolemia    Hypertension    Intervertebral disc disorder    thoracic,thoracolumber,lumbosacral   Multiple sclerosis (HCC)    Obesity    Sleep apnea    Past Surgical History:  Procedure Laterality Date   COLONOSCOPY     COLONOSCOPY WITH PROPOFOL  N/A 07/07/2018   Procedure: COLONOSCOPY WITH PROPOFOL ;  Surgeon: Gaylyn Gladis PENNER, MD;  Location: [facility] ENDOSCOPY;  Service: Endoscopy;  Laterality: N/A;   ESOPHAGOGASTRODUODENOSCOPY (EGD) WITH PROPOFOL  N/A 01/31/2016   Procedure: ESOPHAGOGASTRODUODENOSCOPY (EGD) WITH PROPOFOL ;  Surgeon: Gladis PENNER Gaylyn, MD;  Location: [facility] ENDOSCOPY;  Service: Endoscopy;  Laterality: N/A;   IR FLUORO GUIDE CV LINE RIGHT  10/06/2018   IR REMOVAL TUN CV CATH W/O FL  10/15/2018   IR US  GUIDE VASC ACCESS RIGHT  10/06/2018   NISSEN FUNDOPLICATION     Patient Active Problem List   Diagnosis Date Noted   Chronic kidney disease, stage 2, mildly decreased GFR 02/12/2024   Annual physical exam 02/12/2024   Prediabetes 01/15/2023   Morbid obesity (HCC) 01/15/2023   Mixed hyperlipidemia 01/15/2023   Depression, recurrent (HCC) 01/15/2023   Urinary tract infection, recurrent 07/11/2022    UTI symptoms 06/14/2022   Moderate episode of recurrent major depressive disorder (HCC) 05/28/2019   Excessive daytime sleepiness 08/25/2018   Multiple sclerosis (HCC) 07/08/2018   Neck pain 07/08/2018   Transient vision disturbance of left eye 06/18/2018   Urge incontinence 06/18/2018   Numbness 06/18/2018   Acne rosacea 05/07/2016   Gastroesophageal reflux disease without esophagitis 09/28/2015   Displacement of lumbar intervertebral disc without myelopathy 09/28/2015   Insomnia 09/28/2015   Thoracic, 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   Adiposity 09/28/2015   Restless leg 09/28/2015   Severe obstructive sleep apnea 09/28/2015   Vitamin D  deficiency 09/28/2015   Hypothyroidism 07/11/2015   Hypertension 06/09/2015    PCP: Donzella Lauraine SAILOR, DO  REFERRING PROVIDER: Donzella Lauraine SAILOR, DO  REFERRING DIAG:  [phone] (ICD-10-CM) - Acute pain of left knee  G35 (ICD-10-CM) - Multiple sclerosis (HCC)    RATIONALE FOR EVALUATION AND TREATMENT: Rehabilitation  THERAPY DIAG: Muscle weakness (generalized)  Acute pain of left knee  Stiffness of left knee, not elsewhere classified  ONSET DATE: Chronic   FOLLOW-UP APPT SCHEDULED WITH REFERRING PROVIDER: Yes August 21, 2024    SUBJECTIVE:  SUBJECTIVE STATEMENT:    Patient reporting to OPPT with a c/c of L knee pain.   PERTINENT HISTORY:   Patricia Deleon is a 63 y.o. patient arriving to OPPT with L knee pain. She denies MOI or trauma to the knee. She reports that her knee will lock then pop with a relieving sensation. Additionally she reports that restricted ROM in her L knee compared to R knee. Patient reports aggravating factors: stairway navigation (both directions), sleeping at night. At night she  describes as the pain worsening as it radiates from the L knee distal into the ankle/foot and intermittently it will travel to the L hip. Relieving factors include: daily supplements, gabapentin , ice modalities.   Red flags: chills/fever, night sweats, unexplained weight gain/loss, unrelenting pain  PAIN:   Pain Intensity: Present: 0/10, Best: 0/10, Worst: 8/10 Pain location: L knee Pain quality: intermittent, aching, and throbbing  Radiating pain: Yes  Swelling: No  Popping, catching, locking: Yes  Numbness/Tingling: No Focal weakness or buckling: Yes 24-hour pain behavior: PM - Activity Dependent  History of prior back, hip, or knee injury, pain, surgery, or therapy: Yes  PRECAUTIONS: Fall, Low   WEIGHT BEARING RESTRICTIONS: No  FALLS: Has patient fallen in last 6 months? No  Living Environment Lives with: lives with their family Husband, Daughter, Son, Darden (3 yr old), 3 Dogs Lives in: House/apartment Stairs: Yes: Internal: 12-14 steps; on right going up Has following equipment at home: None  Prior level of function: Independent  Occupational demands: Works at [employer] (M-F, some Sat, 8-6)  Hobbies: None   Patient Goals: Patient would like to be able to perform stairway navigation without pain   OBJECTIVE:   Patient Surveys  LEFS: 28/80 = 35%    Cognition Cognition is within normal limits.     Gross Musculoskeletal Assessment Tremor: None Bulk: Normal Tone: Normal  GAIT: Distance walked: 60m Assistive device utilized: None Level of assistance: Complete Independence Comments: Reciprocal Gait, decreased velocity   Posture: Sitting: WNL Standing: WNL    AROM AROM (Normal range in degrees) AROM  Hip Right Left  Flexion (125) 90   Extension (15)    Abduction (40)    Adduction     Internal Rotation (45)    External Rotation (45)        Knee    Flexion (135) 110 110 *   Extension (0) 0 0      Ankle    Dorsiflexion (20)     Plantarflexion (50)    Inversion (35)    Eversion (15    (* = pain; Blank rows = not tested)  LE MMT: MMT (out of 5) Right Left  Hip flexion 4 4  Hip extension    Hip abduction (Seated) 4+ 4+  Hip adduction 5 5  Hip internal rotation    Hip external rotation    Knee flexion 4 4  Knee extension 4 4*  Ankle dorsiflexion 5 5  Ankle plantarflexion 5 5  Ankle inversion    Ankle eversion    (* = pain; Blank rows = not tested)  Sensation Grossly intact to light touch bilateral LEs as determined by testing dermatomes L2-S2. Proprioception, and hot/cold testing deferred on this date.  Reflexes R/L Knee Jerk (L3/4): 2+/2+  Ankle Jerk (S1/2): 2+/2+   Muscle Length Hamstrings: R: Not examined L: Negative Quadriceps Arvid): R: Not examined L: Not examined   Palpation Location LEFT  RIGHT  Quadriceps 0   Medial Hamstrings 0   Lateral Hamstrings 0   Lateral Hamstring tendon 0   Medial Hamstring tendon 0   Quadriceps tendon 0   Patella    Patellar Tendon    Tibial Tuberosity    Medial joint line  2*    Lateral joint line 1   MCL 1   LCL    Adductor Tubercle    Pes Anserine tendon 1   Infrapatellar fat pad    Fibular head    Popliteal fossa    (Blank rows = not tested) Graded on 0-4 scale (0 = no pain, 1 = pain, 2 = pain with wincing/grimacing/flinching, 3 = pain with withdrawal, 4 = unwilling to allow palpation), (Blank rows = not tested)  Passive Accessory Motion Deferred  VASCULAR Dorsalis pedis and posterior tibial pulses are palpable   SPECIAL TESTS  Ligamentous Stability  MCL: Valgus Stress (30 degrees flexion): R: Not examined L: Negative  LCL: Varus Stress (30 degrees flexion): R: Not examined L: Negative  Meniscus Tests McMurray's Test:  Medial Meniscus (Tibial ER): R: Not examined L: Positive for Pain with ER  Lateral Meniscus (Tibial IR): R: Not examined L: Not examined   Patellar Tendinopathy Inferior pole palpation with anterior  tilt: R: Not examined L: Negative  Functional Testing:  Squat: Deferred Sit to Stand: WNL, Reliant on UE in order to control descent Lateral Step Down: Deferred   : 13.99s 5TSTS: 16.15s   TODAY'S TREATMENT: DATE: 09/16/24   Subjective: Patient reports 1/10 pain in L knee on arrival and still complaints of the L knee feeling full. Has not been able to get into the MD yet. No questions or concerns.  Therapeutic Exercise:   Seated Knee Extension   3 x 10 - 25#       Seated Hamstring Curl   3 x 10 - 40#   Standing Hip Abduction  R/L: 2 x 10 - 25#   Therapeutic Activity:   NuStep L6-2 x 5 min x LE (Seat 10) for LE endurance and strength; PT manually adjusted resistance throughout bout.    Kettle Bell Sumo Squats   2 x 10 - 20# KB      Standing Calve Raise/Stretch at 1st stair step - BUE Support    2 x 15 - for calve strengthening to optimize stairs/walking     Forward Step Down - BUE Support    R/L: 2 x 10 - no pain with step   Fwd step ups 6 step - BUE support   R/L  x 12   PATIENT EDUCATION:  Education details: HEP, Exercise Technique  Person educated: Patient Education method: Explanation, Demonstration, and Handouts Education comprehension: verbalized understanding and returned demonstration   HOME EXERCISE PROGRAM:  Access Code: [email or web address] URL: [email or web address] Date: 08/25/2024 Prepared by: Lonni Pall  Exercises - Supine Heel Slide  - 1 x daily - 3-4 x weekly - 2-3 sets - 10-15 reps - Supine Active Straight Leg Raise  - 1 x daily - 3-4 x weekly - 2-3 sets - 8-10 reps - 5 hold - Long Sitting Quad Set with Towel Roll Under Heel  - 1 x daily - 3-4 x weekly - 2-3 sets - 8-10 reps - Seated Long Arc Quad  - 1 x daily - 7 x weekly - 2-3 sets - 10-15 reps - 3s hold - Mini Squat  - 1 x daily - 3-4 x weekly - 2-3 sets - 8-10 reps - 5s hold -  Forward Lunge with Back Leg Straight and Counter Support  - 1 x daily - 3-4 x weekly - 2-3 sets  - 8-10 reps - Seated Hamstring Stretch  - 1 x daily - 7 x weekly - 3 sets - 15-30s hold - Supine Quadriceps Stretch with Strap on Table  - 1 x daily - 7 x weekly - 2-3 sets - 15-30s hold - Heel Raises with Counter Support  - 1 x daily - 7 x weekly - 2-3 sets - 10 reps - Forward Step Down with Counter Support at Side  - 1 x daily - 7 x weekly - 2-3 sets - 8-10 reps  Access Code: [email or web address] URL: [email or web address] Date: 08/06/2024 Prepared by: Lonni Pall  Exercises - Supine Heel Slide  - 1 x daily - 3-4 x weekly - 2-3 sets - 10-15 reps - Supine Active Straight Leg Raise  - 1 x daily - 3-4 x weekly - 2-3 sets - 8-10 reps - 5 hold - Long Sitting Quad Set with Towel Roll Under Heel  - 1 x daily - 3-4 x weekly - 2-3 sets - 8-10 reps - Seated Long Arc Quad  - 1 x daily - 7 x weekly - 2-3 sets - 10-15 reps - 3s hold - Mini Squat  - 1 x daily - 3-4 x weekly - 2-3 sets - 8-10 reps - 5s hold - Forward Lunge with Back Leg Straight and Counter Support  - 1 x daily - 3-4 x weekly - 2-3 sets - 8-10 reps - Seated Hamstring Stretch  - 1 x daily - 7 x weekly - 3 sets - 15-30s hold - Supine Quadriceps Stretch with Strap on Table  - 1 x daily - 7 x weekly - 2-3 sets - 15-30s hold  ASSESSMENT:  CLINICAL IMPRESSION: Continued PT POC in management of L knee pain. Session focused on functional strengthening activities with target of quad. Good tolerance to all interventions this date but continues to demonstrate slight knee instability with single leg activities. PT to continue functional strengthening in order to improve curb/stair navigation. Based on today's performance, patient will continue to benefit from skilled PT in order to address current deficits and maximize return to PLOF.   OBJECTIVE IMPAIRMENTS: decreased activity tolerance, decreased ROM, decreased strength, obesity, and pain.   ACTIVITY LIMITATIONS: lifting, bending, sitting, standing, squatting, and stairs  PARTICIPATION  LIMITATIONS: driving and occupation  PERSONAL FACTORS: Age, Past/current experiences, Time since onset of injury/illness/exacerbation, and 3+ comorbidities: Multiple Sclerosis, Chronic Kidney Disease, Obesity, etc. are also affecting patient's functional outcome.   REHAB POTENTIAL: Good  CLINICAL DECISION MAKING: Evolving/moderate complexity  EVALUATION COMPLEXITY: Moderate   GOALS: Goals reviewed with patient? Yes  SHORT TERM GOALS: Target date: 10/14/2024  Pt will be independent with HEP to improve strength and decrease knee pain to improve pain-free function at home and work. Baseline: 07/07/2024: Initial HEP  Goal status: INITIAL   LONG TERM GOALS: Target date: 11/11/2024  Patient will be able to safely navigate a flight of 12 stairs, using proper foot placement and handrail support, without requiring assistance from PT in order to demonstrate significant improvement in LE strength and safety. Baseline: 07/07/2024: Pain with ascending/descending with LLE; 09/01/24: Performing reciprocally and with hand rail without external support. Goal status: MET  2.  Pt will decrease worst knee pain by at least 3 points on the NPRS in order to demonstrate clinically significant reduction in knee pain. Baseline: 07/07/2024: 8/10 NPS; 09/01/24: 09/01/24: 0/10 NPS for  the last week Goal status: MET  3.  Pt will increase LEFS score by at least 9 points in order demonstrate clinically significant reduction in knee pain/disability.       Baseline: 07/07/2024: 28/80 = 35%; 09/03/2024 36/80 = 45%;   Goal status: ON GOING   4.  Pt will increase strength of knee flexion/extension by at least 1/2 MMT grade and without pain in order to demonstrate improvement in strength and function  Baseline: 07/07/2024: L Knee Flex/Ext: 4/5, 4/5*; 09/01/24: 5/5 for knee extension and flexion and no pain Goal status: MET  5.  Pt will decrease 5TSTS by at least 3 seconds in order to demonstrate clinically significant  improvement in LE strength Baseline: 07/07/2024: 16.15s; 9.36 seconds   Goal status: MET  6.  Pt will increase by at least 0.13 m/s in order to demonstrate clinically significant improvement in community ambulation.  Baseline: 07/07/2024: .71 m/s (Limited Community Ambulator); 09/01/24: .83 m/s self selected; 1.0 m/s fast speed Goal status: PARTIALLY MET  7. Pt will improve 30 second STS test to 15 reps or greater to meet age and sex matched norms for muscular strength/endurance for ADL completion.      Baseline: 09/01/24: 14 reps     Goal status: Initial   PLAN: PT FREQUENCY: 1-2x/week  PT DURATION: 4 weeks  PLANNED INTERVENTIONS: Therapeutic exercises, Therapeutic activity, Neuromuscular re-education, Balance training, Gait training, Patient/Family education, Self Care, Joint mobilization, Joint manipulation, Vestibular training, Canalith repositioning, Orthotic/Fit training, DME instructions, Dry Needling, Electrical stimulation, Spinal manipulation, Spinal mobilization, Cryotherapy, Moist heat, Taping, Traction, Manual therapy, and Re-evaluation.  PLAN FOR NEXT SESSION: Functional strengthening (STS, Stairs, Curbs)    Maryanne Finder, PT, DPT Physical Therapist - [facility]  09/16/2024, 11:23 AM

## 2024-09-16 ENCOUNTER — Ambulatory Visit

## 2024-09-16 DIAGNOSIS — M6281 Muscle weakness (generalized): Secondary | ICD-10-CM | POA: Diagnosis not present

## 2024-09-16 DIAGNOSIS — M25662 Stiffness of left knee, not elsewhere classified: Secondary | ICD-10-CM

## 2024-09-16 DIAGNOSIS — M25562 Pain in left knee: Secondary | ICD-10-CM

## 2024-09-19 NOTE — Therapy (Signed)
 OUTPATIENT PHYSICAL THERAPY KNEE TREATMENT  Patient Name: Samariyah Cowles MRN: [id number] DOB:03/13/1961, 63 y.o., female Today's Date: 09/21/2024  END OF SESSION:  PT End of Session - 09/21/24 0821     Visit Number 15    Number of Visits 17    Date for Recertification  09/29/24    PT Start Time 0820    PT Stop Time 0858    PT Time Calculation (min) 38 min    Activity Tolerance Patient tolerated treatment well    Behavior During Therapy [facility] for tasks assessed/performed           Past Medical History:  Diagnosis Date   Agoraphobia with panic disorder    Anemia    Depression    Erosive gastritis    Fundic gland polyps of stomach, benign    Geographic tongue    GERD (gastroesophageal reflux disease)    History of esophagogastroduodenoscopy (EGD)    Hypercholesterolemia    Hypertension    Intervertebral disc disorder    thoracic,thoracolumber,lumbosacral   Multiple sclerosis (HCC)    Obesity    Sleep apnea    Past Surgical History:  Procedure Laterality Date   COLONOSCOPY     COLONOSCOPY WITH PROPOFOL  N/A 07/07/2018   Procedure: COLONOSCOPY WITH PROPOFOL ;  Surgeon: Gaylyn Gladis PENNER, MD;  Location: [facility] ENDOSCOPY;  Service: Endoscopy;  Laterality: N/A;   ESOPHAGOGASTRODUODENOSCOPY (EGD) WITH PROPOFOL  N/A 01/31/2016   Procedure: ESOPHAGOGASTRODUODENOSCOPY (EGD) WITH PROPOFOL ;  Surgeon: Gladis PENNER Gaylyn, MD;  Location: [facility] ENDOSCOPY;  Service: Endoscopy;  Laterality: N/A;   IR FLUORO GUIDE CV LINE RIGHT  10/06/2018   IR REMOVAL TUN CV CATH W/O FL  10/15/2018   IR US  GUIDE VASC ACCESS RIGHT  10/06/2018   NISSEN FUNDOPLICATION     Patient Active Problem List   Diagnosis Date Noted   Chronic kidney disease, stage 2, mildly decreased GFR 02/12/2024   Annual physical exam 02/12/2024   Prediabetes 01/15/2023   Morbid obesity (HCC) 01/15/2023   Mixed hyperlipidemia 01/15/2023   Depression, recurrent (HCC) 01/15/2023   Urinary tract infection, recurrent 07/11/2022    UTI symptoms 06/14/2022   Moderate episode of recurrent major depressive disorder (HCC) 05/28/2019   Excessive daytime sleepiness 08/25/2018   Multiple sclerosis (HCC) 07/08/2018   Neck pain 07/08/2018   Transient vision disturbance of left eye 06/18/2018   Urge incontinence 06/18/2018   Numbness 06/18/2018   Acne rosacea 05/07/2016   Gastroesophageal reflux disease without esophagitis 09/28/2015   Displacement of lumbar intervertebral disc without myelopathy 09/28/2015   Insomnia 09/28/2015   Thoracic, 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   Adiposity 09/28/2015   Restless leg 09/28/2015   Severe obstructive sleep apnea 09/28/2015   Vitamin D  deficiency 09/28/2015   Hypothyroidism 07/11/2015   Hypertension 06/09/2015    PCP: Donzella Lauraine SAILOR, DO  REFERRING PROVIDER: Donzella Lauraine SAILOR, DO  REFERRING DIAG:  [phone] (ICD-10-CM) - Acute pain of left knee  G35 (ICD-10-CM) - Multiple sclerosis (HCC)    RATIONALE FOR EVALUATION AND TREATMENT: Rehabilitation  THERAPY DIAG: Muscle weakness (generalized)  Acute pain of left knee  Stiffness of left knee, not elsewhere classified  ONSET DATE: Chronic   FOLLOW-UP APPT SCHEDULED WITH REFERRING PROVIDER: Yes August 21, 2024    SUBJECTIVE:  SUBJECTIVE STATEMENT:    Patient reporting to OPPT with a c/c of L knee pain.   PERTINENT HISTORY:   Ms. Deisi Salonga is a 63 y.o. patient arriving to OPPT with L knee pain. She denies MOI or trauma to the knee. She reports that her knee will lock then pop with a relieving sensation. Additionally she reports that restricted ROM in her L knee compared to R knee. Patient reports aggravating factors: stairway navigation (both directions), sleeping at night. At night she  describes as the pain worsening as it radiates from the L knee distal into the ankle/foot and intermittently it will travel to the L hip. Relieving factors include: daily supplements, gabapentin , ice modalities.   Red flags: chills/fever, night sweats, unexplained weight gain/loss, unrelenting pain  PAIN:   Pain Intensity: Present: 0/10, Best: 0/10, Worst: 8/10 Pain location: L knee Pain quality: intermittent, aching, and throbbing  Radiating pain: Yes  Swelling: No  Popping, catching, locking: Yes  Numbness/Tingling: No Focal weakness or buckling: Yes 24-hour pain behavior: PM - Activity Dependent  History of prior back, hip, or knee injury, pain, surgery, or therapy: Yes  PRECAUTIONS: Fall, Low   WEIGHT BEARING RESTRICTIONS: No  FALLS: Has patient fallen in last 6 months? No  Living Environment Lives with: lives with their family Husband, Daughter, Son, Darden (3 yr old), 3 Dogs Lives in: House/apartment Stairs: Yes: Internal: 12-14 steps; on right going up Has following equipment at home: None  Prior level of function: Independent  Occupational demands: Works at [employer] (M-F, some Sat, 8-6)  Hobbies: None   Patient Goals: Patient would like to be able to perform stairway navigation without pain   OBJECTIVE:   Patient Surveys  LEFS: 28/80 = 35%    Cognition Cognition is within normal limits.     Gross Musculoskeletal Assessment Tremor: None Bulk: Normal Tone: Normal  GAIT: Distance walked: 58m Assistive device utilized: None Level of assistance: Complete Independence Comments: Reciprocal Gait, decreased velocity   Posture: Sitting: WNL Standing: WNL    AROM AROM (Normal range in degrees) AROM  Hip Right Left  Flexion (125) 90   Extension (15)    Abduction (40)    Adduction     Internal Rotation (45)    External Rotation (45)        Knee    Flexion (135) 110 110 *   Extension (0) 0 0      Ankle    Dorsiflexion (20)     Plantarflexion (50)    Inversion (35)    Eversion (15    (* = pain; Blank rows = not tested)  LE MMT: MMT (out of 5) Right Left  Hip flexion 4 4  Hip extension    Hip abduction (Seated) 4+ 4+  Hip adduction 5 5  Hip internal rotation    Hip external rotation    Knee flexion 4 4  Knee extension 4 4*  Ankle dorsiflexion 5 5  Ankle plantarflexion 5 5  Ankle inversion    Ankle eversion    (* = pain; Blank rows = not tested)  Sensation Grossly intact to light touch bilateral LEs as determined by testing dermatomes L2-S2. Proprioception, and hot/cold testing deferred on this date.  Reflexes R/L Knee Jerk (L3/4): 2+/2+  Ankle Jerk (S1/2): 2+/2+   Muscle Length Hamstrings: R: Not examined L: Negative Quadriceps Arvid): R: Not examined L: Not examined   Palpation Location LEFT  RIGHT  Quadriceps 0   Medial Hamstrings 0   Lateral Hamstrings 0   Lateral Hamstring tendon 0   Medial Hamstring tendon 0   Quadriceps tendon 0   Patella    Patellar Tendon    Tibial Tuberosity    Medial joint line  2*    Lateral joint line 1   MCL 1   LCL    Adductor Tubercle    Pes Anserine tendon 1   Infrapatellar fat pad    Fibular head    Popliteal fossa    (Blank rows = not tested) Graded on 0-4 scale (0 = no pain, 1 = pain, 2 = pain with wincing/grimacing/flinching, 3 = pain with withdrawal, 4 = unwilling to allow palpation), (Blank rows = not tested)  Passive Accessory Motion Deferred  VASCULAR Dorsalis pedis and posterior tibial pulses are palpable   SPECIAL TESTS  Ligamentous Stability  MCL: Valgus Stress (30 degrees flexion): R: Not examined L: Negative  LCL: Varus Stress (30 degrees flexion): R: Not examined L: Negative  Meniscus Tests McMurray's Test:  Medial Meniscus (Tibial ER): R: Not examined L: Positive for Pain with ER  Lateral Meniscus (Tibial IR): R: Not examined L: Not examined   Patellar Tendinopathy Inferior pole palpation with anterior  tilt: R: Not examined L: Negative  Functional Testing:  Squat: Deferred Sit to Stand: WNL, Reliant on UE in order to control descent Lateral Step Down: Deferred   : 13.99s 5TSTS: 16.15s   TODAY'S TREATMENT: DATE: 09/21/24    Subjective: Patient reports she is still feeling that full sensation in her L knee. Patient reports her knee is feeling better today compared to last session.   Therapeutic Exercise:  Matrix Cable Machine  Standing Hip flexion    R/L 2 x 10 - 25#   Standing Hip Abduction   R/L: 2 x 10 - 25#   Standing Hip Extension    R/L 2 x 10 - 25#  Therapeutic Activity:   NuStep L6-2 x 5 min x LE (Seat 10) for LE endurance and strength; PT manually adjusted resistance throughout bout.    Kettle Bell Sumo Squats   2 x 10 - 20# KB    Forward step over 6 hurdles (5)   X 3 laps leading with each LE - no UE support       Fwd step ups 6 step - BUE support (less support compared to last session)   R/L 3 x 10   PATIENT EDUCATION:  Education details: HEP, Exercise Technique  Person educated: Patient Education method: Explanation, Demonstration, and Handouts Education comprehension: verbalized understanding and returned demonstration   HOME EXERCISE PROGRAM:  Access Code: [email or web address] URL: [email or web address] Date: 08/25/2024 Prepared by: Lonni Pall  Exercises - Supine Heel Slide  - 1 x daily - 3-4 x weekly - 2-3 sets - 10-15 reps - Supine Active Straight Leg Raise  - 1 x daily - 3-4 x weekly - 2-3 sets - 8-10 reps - 5 hold - Long Sitting Quad Set with Towel Roll Under Heel  - 1 x daily - 3-4 x weekly - 2-3 sets - 8-10 reps - Seated Long Arc Quad  - 1 x daily - 7 x weekly - 2-3 sets - 10-15 reps - 3s hold - Mini Squat  - 1 x daily - 3-4 x weekly - 2-3 sets - 8-10 reps - 5s hold - Forward Lunge with Back Leg Straight and Counter Support  - 1 x daily - 3-4 x weekly -  2-3 sets - 8-10 reps - Seated Hamstring Stretch  - 1 x daily - 7 x  weekly - 3 sets - 15-30s hold - Supine Quadriceps Stretch with Strap on Table  - 1 x daily - 7 x weekly - 2-3 sets - 15-30s hold - Heel Raises with Counter Support  - 1 x daily - 7 x weekly - 2-3 sets - 10 reps - Forward Step Down with Counter Support at Side  - 1 x daily - 7 x weekly - 2-3 sets - 8-10 reps  Access Code: [email or web address] URL: [email or web address] Date: 08/06/2024 Prepared by: Lonni Pall  Exercises - Supine Heel Slide  - 1 x daily - 3-4 x weekly - 2-3 sets - 10-15 reps - Supine Active Straight Leg Raise  - 1 x daily - 3-4 x weekly - 2-3 sets - 8-10 reps - 5 hold - Long Sitting Quad Set with Towel Roll Under Heel  - 1 x daily - 3-4 x weekly - 2-3 sets - 8-10 reps - Seated Long Arc Quad  - 1 x daily - 7 x weekly - 2-3 sets - 10-15 reps - 3s hold - Mini Squat  - 1 x daily - 3-4 x weekly - 2-3 sets - 8-10 reps - 5s hold - Forward Lunge with Back Leg Straight and Counter Support  - 1 x daily - 3-4 x weekly - 2-3 sets - 8-10 reps - Seated Hamstring Stretch  - 1 x daily - 7 x weekly - 3 sets - 15-30s hold - Supine Quadriceps Stretch with Strap on Table  - 1 x daily - 7 x weekly - 2-3 sets - 15-30s hold  ASSESSMENT:  CLINICAL IMPRESSION:   Continued PT POC in management of L knee pain. Session focused on functional strengthening activities with addition of obstacle negotiation with good tolerance. Demonstrates improved LE strength with step ups and less reliance on UE support this date. PT to continue functional strengthening in order to improve curb/stair navigation. Based on today's performance, patient will continue to benefit from skilled PT in order to address current deficits and maximize return to PLOF.   OBJECTIVE IMPAIRMENTS: decreased activity tolerance, decreased ROM, decreased strength, obesity, and pain.   ACTIVITY LIMITATIONS: lifting, bending, sitting, standing, squatting, and stairs  PARTICIPATION LIMITATIONS: driving and occupation  PERSONAL FACTORS:  Age, Past/current experiences, Time since onset of injury/illness/exacerbation, and 3+ comorbidities: Multiple Sclerosis, Chronic Kidney Disease, Obesity, etc. are also affecting patient's functional outcome.   REHAB POTENTIAL: Good  CLINICAL DECISION MAKING: Evolving/moderate complexity  EVALUATION COMPLEXITY: Moderate   GOALS: Goals reviewed with patient? Yes  SHORT TERM GOALS: Target date: 10/19/2024  Pt will be independent with HEP to improve strength and decrease knee pain to improve pain-free function at home and work. Baseline: 07/07/2024: Initial HEP  Goal status: INITIAL   LONG TERM GOALS: Target date: 11/16/2024  Patient will be able to safely navigate a flight of 12 stairs, using proper foot placement and handrail support, without requiring assistance from PT in order to demonstrate significant improvement in LE strength and safety. Baseline: 07/07/2024: Pain with ascending/descending with LLE; 09/01/24: Performing reciprocally and with hand rail without external support. Goal status: MET  2.  Pt will decrease worst knee pain by at least 3 points on the NPRS in order to demonstrate clinically significant reduction in knee pain. Baseline: 07/07/2024: 8/10 NPS; 09/01/24: 09/01/24: 0/10 NPS for the last week Goal status: MET  3.  Pt will increase LEFS score by at  least 9 points in order demonstrate clinically significant reduction in knee pain/disability.       Baseline: 07/07/2024: 28/80 = 35%; 09/03/2024 36/80 = 45%;   Goal status: ON GOING   4.  Pt will increase strength of knee flexion/extension by at least 1/2 MMT grade and without pain in order to demonstrate improvement in strength and function  Baseline: 07/07/2024: L Knee Flex/Ext: 4/5, 4/5*; 09/01/24: 5/5 for knee extension and flexion and no pain Goal status: MET  5.  Pt will decrease 5TSTS by at least 3 seconds in order to demonstrate clinically significant improvement in LE strength Baseline: 07/07/2024: 16.15s;  9.36 seconds   Goal status: MET  6.  Pt will increase by at least 0.13 m/s in order to demonstrate clinically significant improvement in community ambulation.  Baseline: 07/07/2024: .71 m/s (Limited Community Ambulator); 09/01/24: .83 m/s self selected; 1.0 m/s fast speed Goal status: PARTIALLY MET  7. Pt will improve 30 second STS test to 15 reps or greater to meet age and sex matched norms for muscular strength/endurance for ADL completion.      Baseline: 09/01/24: 14 reps     Goal status: Initial   PLAN: PT FREQUENCY: 1-2x/week  PT DURATION: 4 weeks  PLANNED INTERVENTIONS: Therapeutic exercises, Therapeutic activity, Neuromuscular re-education, Balance training, Gait training, Patient/Family education, Self Care, Joint mobilization, Joint manipulation, Vestibular training, Canalith repositioning, Orthotic/Fit training, DME instructions, Dry Needling, Electrical stimulation, Spinal manipulation, Spinal mobilization, Cryotherapy, Moist heat, Taping, Traction, Manual therapy, and Re-evaluation.  PLAN FOR NEXT SESSION: Functional strengthening (STS, Stairs, Curbs)    Maryanne Finder, PT, DPT Physical Therapist - [place]  [facility]  09/21/2024, 8:22 AM

## 2024-09-21 ENCOUNTER — Ambulatory Visit

## 2024-09-21 DIAGNOSIS — M6281 Muscle weakness (generalized): Secondary | ICD-10-CM | POA: Diagnosis not present

## 2024-09-21 DIAGNOSIS — M25662 Stiffness of left knee, not elsewhere classified: Secondary | ICD-10-CM

## 2024-09-21 DIAGNOSIS — M25562 Pain in left knee: Secondary | ICD-10-CM

## 2024-09-22 NOTE — Therapy (Signed)
 OUTPATIENT PHYSICAL THERAPY KNEE TREATMENT  Patient Name: Patricia Deleon MRN: [id number] DOB:20-Jan-1961, 63 y.o., female Today's Date: 09/23/2024  END OF SESSION:  PT End of Session - 09/23/24 1122     Visit Number 16    Number of Visits 17    Date for Recertification  09/29/24    PT Start Time 1121    PT Stop Time 1159    PT Time Calculation (min) 38 min    Activity Tolerance Patient tolerated treatment well    Behavior During Therapy WFL for tasks assessed/performed            Past Medical History:  Diagnosis Date   Agoraphobia with panic disorder    Anemia    Depression    Erosive gastritis    Fundic gland polyps of stomach, benign    Geographic tongue    GERD (gastroesophageal reflux disease)    History of esophagogastroduodenoscopy (EGD)    Hypercholesterolemia    Hypertension    Intervertebral disc disorder    thoracic,thoracolumber,lumbosacral   Multiple sclerosis    Obesity    Sleep apnea    Past Surgical History:  Procedure Laterality Date   COLONOSCOPY     COLONOSCOPY WITH PROPOFOL  N/A 07/07/2018   Procedure: COLONOSCOPY WITH PROPOFOL ;  Surgeon: Gaylyn Gladis PENNER, MD;  Location: [facility] ENDOSCOPY;  Service: Endoscopy;  Laterality: N/A;   ESOPHAGOGASTRODUODENOSCOPY (EGD) WITH PROPOFOL  N/A 01/31/2016   Procedure: ESOPHAGOGASTRODUODENOSCOPY (EGD) WITH PROPOFOL ;  Surgeon: Gladis PENNER Gaylyn, MD;  Location: [facility] ENDOSCOPY;  Service: Endoscopy;  Laterality: N/A;   IR FLUORO GUIDE CV LINE RIGHT  10/06/2018   IR REMOVAL TUN CV CATH W/O FL  10/15/2018   IR US  GUIDE VASC ACCESS RIGHT  10/06/2018   NISSEN FUNDOPLICATION     Patient Active Problem List   Diagnosis Date Noted   Chronic kidney disease, stage 2, mildly decreased GFR 02/12/2024   Annual physical exam 02/12/2024   Prediabetes 01/15/2023   Morbid obesity (HCC) 01/15/2023   Mixed hyperlipidemia 01/15/2023   Depression, recurrent 01/15/2023   Urinary tract infection, recurrent 07/11/2022   UTI  symptoms 06/14/2022   Moderate episode of recurrent major depressive disorder (HCC) 05/28/2019   Excessive daytime sleepiness 08/25/2018   Multiple sclerosis 07/08/2018   Neck pain 07/08/2018   Transient vision disturbance of left eye 06/18/2018   Urge incontinence 06/18/2018   Numbness 06/18/2018   Acne rosacea 05/07/2016   Gastroesophageal reflux disease without esophagitis 09/28/2015   Displacement of lumbar intervertebral disc without myelopathy 09/28/2015   Insomnia 09/28/2015   Thoracic, 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   Adiposity 09/28/2015   Restless leg 09/28/2015   Severe obstructive sleep apnea 09/28/2015   Vitamin D  deficiency 09/28/2015   Hypothyroidism 07/11/2015   Hypertension 06/09/2015    PCP: Patricia Lauraine SAILOR, DO  REFERRING PROVIDER: Donzella Lauraine SAILOR, DO  REFERRING DIAG:  [phone] (ICD-10-CM) - Acute pain of left knee  G35 (ICD-10-CM) - Multiple sclerosis (HCC)    RATIONALE FOR EVALUATION AND TREATMENT: Rehabilitation  THERAPY DIAG: Muscle weakness (generalized)  Acute pain of left knee  Stiffness of left knee, not elsewhere classified  ONSET DATE: Chronic   FOLLOW-UP APPT SCHEDULED WITH REFERRING PROVIDER: Yes August 21, 2024    SUBJECTIVE:  SUBJECTIVE STATEMENT:    Patient reporting to OPPT with a c/c of L knee pain.   PERTINENT HISTORY:   [name] is a 63 y.o. patient arriving to OPPT with L knee pain. She denies MOI or trauma to the knee. She reports that her knee will lock then pop with a relieving sensation. Additionally she reports that restricted ROM in her L knee compared to R knee. Patient reports aggravating factors: stairway navigation (both directions), sleeping at night. At night she describes as  the pain worsening as it radiates from the L knee distal into the ankle/foot and intermittently it will travel to the L hip. Relieving factors include: daily supplements, gabapentin , ice modalities.   Red flags: chills/fever, night sweats, unexplained weight gain/loss, unrelenting pain  PAIN:   Pain Intensity: Present: 0/10, Best: 0/10, Worst: 8/10 Pain location: L knee Pain quality: intermittent, aching, and throbbing  Radiating pain: Yes  Swelling: No  Popping, catching, locking: Yes  Numbness/Tingling: No Focal weakness or buckling: Yes 24-hour pain behavior: PM - Activity Dependent  History of prior back, hip, or knee injury, pain, surgery, or therapy: Yes  PRECAUTIONS: Fall, Low   WEIGHT BEARING RESTRICTIONS: No  FALLS: Has patient fallen in last 6 months? No  Living Environment Lives with: lives with their family Husband, Daughter, Son, Darden (3 yr old), 3 Dogs Lives in: House/apartment Stairs: Yes: Internal: 12-14 steps; on right going up Has following equipment at home: None  Prior level of function: Independent  Occupational demands: Works at [employer] (M-F, some Sat, 8-6)  Hobbies: None   Patient Goals: Patient would like to be able to perform stairway navigation without pain   OBJECTIVE:   Patient Surveys  LEFS: 28/80 = 35%    Cognition Cognition is within normal limits.     Gross Musculoskeletal Assessment Tremor: None Bulk: Normal Tone: Normal  GAIT: Distance walked: 38m Assistive device utilized: None Level of assistance: Complete Independence Comments: Reciprocal Gait, decreased velocity   Posture: Sitting: WNL Standing: WNL    AROM AROM (Normal range in degrees) AROM  Hip Right Left  Flexion (125) 90   Extension (15)    Abduction (40)    Adduction     Internal Rotation (45)    External Rotation (45)        Knee    Flexion (135) 110 110 *   Extension (0) 0 0      Ankle    Dorsiflexion (20)    Plantarflexion  (50)    Inversion (35)    Eversion (15    (* = pain; Blank rows = not tested)  LE MMT: MMT (out of 5) Right Left  Hip flexion 4 4  Hip extension    Hip abduction (Seated) 4+ 4+  Hip adduction 5 5  Hip internal rotation    Hip external rotation    Knee flexion 4 4  Knee extension 4 4*  Ankle dorsiflexion 5 5  Ankle plantarflexion 5 5  Ankle inversion    Ankle eversion    (* = pain; Blank rows = not tested)  Sensation Grossly intact to light touch bilateral LEs as determined by testing dermatomes L2-S2. Proprioception, and hot/cold testing deferred on this date.  Reflexes R/L Knee Jerk (L3/4): 2+/2+  Ankle Jerk (S1/2): 2+/2+   Muscle Length Hamstrings: R: Not examined L: Negative Quadriceps Arvid): R: Not examined L: Not examined   Palpation Location LEFT  RIGHT  Quadriceps 0   Medial Hamstrings 0   Lateral Hamstrings 0   Lateral Hamstring tendon 0   Medial Hamstring tendon 0   Quadriceps tendon 0   Patella    Patellar Tendon    Tibial Tuberosity    Medial joint line  2*    Lateral joint line 1   MCL 1   LCL    Adductor Tubercle    Pes Anserine tendon 1   Infrapatellar fat pad    Fibular head    Popliteal fossa    (Blank rows = not tested) Graded on 0-4 scale (0 = no pain, 1 = pain, 2 = pain with wincing/grimacing/flinching, 3 = pain with withdrawal, 4 = unwilling to allow palpation), (Blank rows = not tested)  Passive Accessory Motion Deferred  VASCULAR Dorsalis pedis and posterior tibial pulses are palpable   SPECIAL TESTS  Ligamentous Stability  MCL: Valgus Stress (30 degrees flexion): R: Not examined L: Negative  LCL: Varus Stress (30 degrees flexion): R: Not examined L: Negative  Meniscus Tests McMurray's Test:  Medial Meniscus (Tibial ER): R: Not examined L: Positive for Pain with ER  Lateral Meniscus (Tibial IR): R: Not examined L: Not examined   Patellar Tendinopathy Inferior pole palpation with anterior tilt: R: Not  examined L: Negative  Functional Testing:  Squat: Deferred Sit to Stand: WNL, Reliant on UE in order to control descent Lateral Step Down: Deferred   : 13.99s 5TSTS: 16.15s   TODAY'S TREATMENT: DATE: 09/23/24     Subjective: Patient reports she is still feeling that full sensation in her L knee but no pain. Plans to call MD today about her knee.   Therapeutic Exercise:  Matrix Cable Machine  Standing Hip flexion    R/L 2 x 10 - 25#   Standing Hip Abduction   R/L: 2 x 10 - 25#   Standing Hip Extension    R/L 2 x 10 - 25#  Therapeutic Activity:   NuStep L6-2 x 5 min x LE (Seat 10) for LE endurance and strength; PT manually adjusted resistance throughout bout.    Lateral step over 6 hurdles (5)   X 4 laps each direction - no UE support    Forward step over 6 hurdles (5)   X 3 laps leading with each LE - no UE support       Fwd step down 4 step - BUE support (less support compared to last session)   R/L 2 x 10   PATIENT EDUCATION:  Education details: HEP, Exercise Technique  Person educated: Patient Education method: Explanation, Demonstration, and Handouts Education comprehension: verbalized understanding and returned demonstration   HOME EXERCISE PROGRAM:  Access Code: [email or web address] URL: [email or web address] Date: 08/25/2024 Prepared by: Lonni Pall  Exercises - Supine Heel Slide  - 1 x daily - 3-4 x weekly - 2-3 sets - 10-15 reps - Supine Active Straight Leg Raise  - 1 x daily - 3-4 x weekly - 2-3 sets - 8-10 reps - 5 hold - Long Sitting Quad Set with Towel Roll Under Heel  - 1 x daily - 3-4 x weekly - 2-3 sets - 8-10 reps - Seated Long Arc Quad  - 1 x daily - 7 x weekly - 2-3 sets - 10-15 reps - 3s hold - Mini Squat  - 1 x daily - 3-4 x weekly - 2-3 sets - 8-10 reps - 5s hold - Forward Lunge with Back Leg Straight and Counter Support  - 1 x daily -  3-4 x weekly - 2-3 sets - 8-10 reps - Seated Hamstring Stretch  - 1 x daily - 7 x  weekly - 3 sets - 15-30s hold - Supine Quadriceps Stretch with Strap on Table  - 1 x daily - 7 x weekly - 2-3 sets - 15-30s hold - Heel Raises with Counter Support  - 1 x daily - 7 x weekly - 2-3 sets - 10 reps - Forward Step Down with Counter Support at Side  - 1 x daily - 7 x weekly - 2-3 sets - 8-10 reps  Access Code: [email or web address] URL: [email or web address] Date: 08/06/2024 Prepared by: Lonni Pall  Exercises - Supine Heel Slide  - 1 x daily - 3-4 x weekly - 2-3 sets - 10-15 reps - Supine Active Straight Leg Raise  - 1 x daily - 3-4 x weekly - 2-3 sets - 8-10 reps - 5 hold - Long Sitting Quad Set with Towel Roll Under Heel  - 1 x daily - 3-4 x weekly - 2-3 sets - 8-10 reps - Seated Long Arc Quad  - 1 x daily - 7 x weekly - 2-3 sets - 10-15 reps - 3s hold - Mini Squat  - 1 x daily - 3-4 x weekly - 2-3 sets - 8-10 reps - 5s hold - Forward Lunge with Back Leg Straight and Counter Support  - 1 x daily - 3-4 x weekly - 2-3 sets - 8-10 reps - Seated Hamstring Stretch  - 1 x daily - 7 x weekly - 3 sets - 15-30s hold - Supine Quadriceps Stretch with Strap on Table  - 1 x daily - 7 x weekly - 2-3 sets - 15-30s hold  ASSESSMENT:  CLINICAL IMPRESSION:    Continued PT POC in management of L knee pain. Continued focus on obstacle negotiation with emphasis on single leg stance on L. Tolerated session well with no increase in pain. PT to continue functional strengthening in order to improve curb/stair navigation. Based on today's performance, patient will continue to benefit from skilled PT in order to address current deficits and maximize return to PLOF.   OBJECTIVE IMPAIRMENTS: decreased activity tolerance, decreased ROM, decreased strength, obesity, and pain.   ACTIVITY LIMITATIONS: lifting, bending, sitting, standing, squatting, and stairs  PARTICIPATION LIMITATIONS: driving and occupation  PERSONAL FACTORS: Age, Past/current experiences, Time since onset of  injury/illness/exacerbation, and 3+ comorbidities: Multiple Sclerosis, Chronic Kidney Disease, Obesity, etc. are also affecting patient's functional outcome.   REHAB POTENTIAL: Good  CLINICAL DECISION MAKING: Evolving/moderate complexity  EVALUATION COMPLEXITY: Moderate   GOALS: Goals reviewed with patient? Yes  SHORT TERM GOALS: Target date: 10/21/2024  Pt will be independent with HEP to improve strength and decrease knee pain to improve pain-free function at home and work. Baseline: 07/07/2024: Initial HEP  Goal status: INITIAL   LONG TERM GOALS: Target date: 11/18/2024  Patient will be able to safely navigate a flight of 12 stairs, using proper foot placement and handrail support, without requiring assistance from PT in order to demonstrate significant improvement in LE strength and safety. Baseline: 07/07/2024: Pain with ascending/descending with LLE; 09/01/24: Performing reciprocally and with hand rail without external support. Goal status: MET  2.  Pt will decrease worst knee pain by at least 3 points on the NPRS in order to demonstrate clinically significant reduction in knee pain. Baseline: 07/07/2024: 8/10 NPS; 09/01/24: 09/01/24: 0/10 NPS for the last week Goal status: MET  3.  Pt will increase LEFS score by at least 9 points  in order demonstrate clinically significant reduction in knee pain/disability.       Baseline: 07/07/2024: 28/80 = 35%; 09/03/2024 36/80 = 45%;   Goal status: ON GOING   4.  Pt will increase strength of knee flexion/extension by at least 1/2 MMT grade and without pain in order to demonstrate improvement in strength and function  Baseline: 07/07/2024: L Knee Flex/Ext: 4/5, 4/5*; 09/01/24: 5/5 for knee extension and flexion and no pain Goal status: MET  5.  Pt will decrease 5TSTS by at least 3 seconds in order to demonstrate clinically significant improvement in LE strength Baseline: 07/07/2024: 16.15s; 9.36 seconds   Goal status: MET  6.  Pt will  increase by at least 0.13 m/s in order to demonstrate clinically significant improvement in community ambulation.  Baseline: 07/07/2024: .71 m/s (Limited Community Ambulator); 09/01/24: .83 m/s self selected; 1.0 m/s fast speed Goal status: PARTIALLY MET  7. Pt will improve 30 second STS test to 15 reps or greater to meet age and sex matched norms for muscular strength/endurance for ADL completion.      Baseline: 09/01/24: 14 reps     Goal status: Initial   PLAN: PT FREQUENCY: 1-2x/week  PT DURATION: 4 weeks  PLANNED INTERVENTIONS: Therapeutic exercises, Therapeutic activity, Neuromuscular re-education, Balance training, Gait training, Patient/Family education, Self Care, Joint mobilization, Joint manipulation, Vestibular training, Canalith repositioning, Orthotic/Fit training, DME instructions, Dry Needling, Electrical stimulation, Spinal manipulation, Spinal mobilization, Cryotherapy, Moist heat, Taping, Traction, Manual therapy, and Re-evaluation.  PLAN FOR NEXT SESSION: Functional strengthening (STS, Stairs, Curbs)    Maryanne Finder, PT, DPT Physical Therapist - [place]  [facility]  09/23/2024, 11:23 AM

## 2024-09-23 ENCOUNTER — Ambulatory Visit

## 2024-09-23 DIAGNOSIS — M6281 Muscle weakness (generalized): Secondary | ICD-10-CM | POA: Diagnosis not present

## 2024-09-23 DIAGNOSIS — M25562 Pain in left knee: Secondary | ICD-10-CM

## 2024-09-23 DIAGNOSIS — M25662 Stiffness of left knee, not elsewhere classified: Secondary | ICD-10-CM

## 2024-10-03 ENCOUNTER — Encounter: Payer: Self-pay | Admitting: Urology

## 2024-10-03 DIAGNOSIS — N390 Urinary tract infection, site not specified: Secondary | ICD-10-CM

## 2024-10-05 NOTE — Addendum Note (Signed)
 Addended byBETHA CORIE PLATER on: 10/05/2024 04:59 PM   Modules accepted: Orders

## 2024-10-06 NOTE — Therapy (Incomplete)
 OUTPATIENT PHYSICAL THERAPY KNEE TREATMENT  Patient Name: Shakina Choy MRN: [id number] DOB:[birth year], 63 y.o., female Today's Date: 10/06/2024  END OF SESSION:      Past Medical History:  Diagnosis Date   Agoraphobia with panic disorder    Anemia    Depression    Erosive gastritis    Fundic gland polyps of stomach, benign    Geographic tongue    GERD (gastroesophageal reflux disease)    History of esophagogastroduodenoscopy (EGD)    Hypercholesterolemia    Hypertension    Intervertebral disc disorder    thoracic,thoracolumber,lumbosacral   Multiple sclerosis    Obesity    Sleep apnea    Past Surgical History:  Procedure Laterality Date   COLONOSCOPY     COLONOSCOPY WITH PROPOFOL  N/A 07/07/2018   Procedure: COLONOSCOPY WITH PROPOFOL ;  Surgeon: Gaylyn Gladis PENNER, MD;  Location: [facility] ENDOSCOPY;  Service: Endoscopy;  Laterality: N/A;   ESOPHAGOGASTRODUODENOSCOPY (EGD) WITH PROPOFOL  N/A 01/31/2016   Procedure: ESOPHAGOGASTRODUODENOSCOPY (EGD) WITH PROPOFOL ;  Surgeon: Gladis PENNER Gaylyn, MD;  Location: [facility] ENDOSCOPY;  Service: Endoscopy;  Laterality: N/A;   IR FLUORO GUIDE CV LINE RIGHT  10/06/2018   IR REMOVAL TUN CV CATH W/O FL  10/15/2018   IR US  GUIDE VASC ACCESS RIGHT  10/06/2018   NISSEN FUNDOPLICATION     Patient Active Problem List   Diagnosis Date Noted   Chronic kidney disease, stage 2, mildly decreased GFR 02/12/2024   Annual physical exam 02/12/2024   Prediabetes 01/15/2023   Morbid obesity (HCC) 01/15/2023   Mixed hyperlipidemia 01/15/2023   Depression, recurrent 01/15/2023   Urinary tract infection, recurrent 07/11/2022   UTI symptoms 06/14/2022   Moderate episode of recurrent major depressive disorder (HCC) 05/28/2019   Excessive daytime sleepiness 08/25/2018   Multiple sclerosis 07/08/2018   Neck pain 07/08/2018   Transient vision disturbance of left eye 06/18/2018   Urge incontinence 06/18/2018   Numbness 06/18/2018   Acne rosacea 05/07/2016    Gastroesophageal reflux disease without esophagitis 09/28/2015   Displacement of lumbar intervertebral disc without myelopathy 09/28/2015   Insomnia 09/28/2015   Thoracic, 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   Adiposity 09/28/2015   Restless leg 09/28/2015   Severe obstructive sleep apnea 09/28/2015   Vitamin D  deficiency 09/28/2015   Hypothyroidism 07/11/2015   Hypertension 06/09/2015    PCP: Donzella Lauraine SAILOR, DO  REFERRING PROVIDER: Donzella Lauraine SAILOR, DO  REFERRING DIAG:  [phone] (ICD-10-CM) - Acute pain of left knee  G35 (ICD-10-CM) - Multiple sclerosis (HCC)    RATIONALE FOR EVALUATION AND TREATMENT: Rehabilitation  THERAPY DIAG: Muscle weakness (generalized)  Acute pain of left knee  Stiffness of left knee, not elsewhere classified  ONSET DATE: Chronic   FOLLOW-UP APPT SCHEDULED WITH REFERRING PROVIDER: Yes August 21, 2024    SUBJECTIVE:  SUBJECTIVE STATEMENT:    Patient reporting to OPPT with a c/c of L knee pain.   PERTINENT HISTORY:   Ms. Jazline Cumbee is a 63 y.o. patient arriving to OPPT with L knee pain. She denies MOI or trauma to the knee. She reports that her knee will lock then pop with a relieving sensation. Additionally she reports that restricted ROM in her L knee compared to R knee. Patient reports aggravating factors: stairway navigation (both directions), sleeping at night. At night she describes as the pain worsening as it radiates from the L knee distal into the ankle/foot and intermittently it will travel to the L hip. Relieving factors include: daily supplements, gabapentin , ice modalities.   Red flags: chills/fever, night sweats, unexplained weight gain/loss, unrelenting pain  PAIN:   Pain Intensity: Present: 0/10,  Best: 0/10, Worst: 8/10 Pain location: L knee Pain quality: intermittent, aching, and throbbing  Radiating pain: Yes  Swelling: No  Popping, catching, locking: Yes  Numbness/Tingling: No Focal weakness or buckling: Yes 24-hour pain behavior: PM - Activity Dependent  History of prior back, hip, or knee injury, pain, surgery, or therapy: Yes  PRECAUTIONS: Fall, Low   WEIGHT BEARING RESTRICTIONS: No  FALLS: Has patient fallen in last 6 months? No  Living Environment Lives with: lives with their family Husband, Daughter, Son, Darden (3 yr old), 3 Dogs Lives in: House/apartment Stairs: Yes: Internal: 12-14 steps; on right going up Has following equipment at home: None  Prior level of function: Independent  Occupational demands: Works at [employer] (M-F, some Sat, 8-6)  Hobbies: None   Patient Goals: Patient would like to be able to perform stairway navigation without pain   OBJECTIVE:   Patient Surveys  LEFS: 28/80 = 35%    Cognition Cognition is within normal limits.     Gross Musculoskeletal Assessment Tremor: None Bulk: Normal Tone: Normal  GAIT: Distance walked: 45m Assistive device utilized: None Level of assistance: Complete Independence Comments: Reciprocal Gait, decreased velocity   Posture: Sitting: WNL Standing: WNL    AROM AROM (Normal range in degrees) AROM  Hip Right Left  Flexion (125) 90   Extension (15)    Abduction (40)    Adduction     Internal Rotation (45)    External Rotation (45)        Knee    Flexion (135) 110 110 *   Extension (0) 0 0      Ankle    Dorsiflexion (20)    Plantarflexion (50)    Inversion (35)    Eversion (15    (* = pain; Blank rows = not tested)  LE MMT: MMT (out of 5) Right Left  Hip flexion 4 4  Hip extension    Hip abduction (Seated) 4+ 4+  Hip adduction 5 5  Hip internal rotation    Hip external rotation    Knee flexion 4 4  Knee extension 4 4*  Ankle dorsiflexion 5 5  Ankle  plantarflexion 5 5  Ankle inversion    Ankle eversion    (* = pain; Blank rows = not tested)  Sensation Grossly intact to light touch bilateral LEs as determined by testing dermatomes L2-S2. Proprioception, and hot/cold testing deferred on this date.  Reflexes R/L Knee Jerk (L3/4): 2+/2+  Ankle Jerk (S1/2): 2+/2+   Muscle Length Hamstrings: R: Not examined L: Negative Quadriceps Arvid): R: Not examined L: Not examined   Palpation Location LEFT  RIGHT  Quadriceps 0   Medial Hamstrings 0   Lateral Hamstrings 0   Lateral Hamstring tendon 0   Medial Hamstring tendon 0   Quadriceps tendon 0   Patella    Patellar Tendon    Tibial Tuberosity    Medial joint line  2*    Lateral joint line 1   MCL 1   LCL    Adductor Tubercle    Pes Anserine tendon 1   Infrapatellar fat pad    Fibular head    Popliteal fossa    (Blank rows = not tested) Graded on 0-4 scale (0 = no pain, 1 = pain, 2 = pain with wincing/grimacing/flinching, 3 = pain with withdrawal, 4 = unwilling to allow palpation), (Blank rows = not tested)  Passive Accessory Motion Deferred  VASCULAR Dorsalis pedis and posterior tibial pulses are palpable   SPECIAL TESTS  Ligamentous Stability  MCL: Valgus Stress (30 degrees flexion): R: Not examined L: Negative  LCL: Varus Stress (30 degrees flexion): R: Not examined L: Negative  Meniscus Tests McMurray's Test:  Medial Meniscus (Tibial ER): R: Not examined L: Positive for Pain with ER  Lateral Meniscus (Tibial IR): R: Not examined L: Not examined   Patellar Tendinopathy Inferior pole palpation with anterior tilt: R: Not examined L: Negative  Functional Testing:  Squat: Deferred Sit to Stand: WNL, Reliant on UE in order to control descent Lateral Step Down: Deferred   : 13.99s 5TSTS: 16.15s   TODAY'S TREATMENT: DATE: 10/06/24    *** Subjective: Patient reports she is still feeling that full sensation in her L knee but no pain.  Plans to call MD today about her knee.   Therapeutic Exercise:  Matrix Cable Machine  Standing Hip flexion    R/L 2 x 10 - 25#   Standing Hip Abduction   R/L: 2 x 10 - 25#   Standing Hip Extension    R/L 2 x 10 - 25#  Therapeutic Activity:   NuStep L6-2 x 5 min x LE (Seat 10) for LE endurance and strength; PT manually adjusted resistance throughout bout.    Lateral step over 6 hurdles (5)   X 4 laps each direction - no UE support    Forward step over 6 hurdles (5)   X 3 laps leading with each LE - no UE support       Fwd step down 4 step - BUE support (less support compared to last session)   R/L 2 x 10   PATIENT EDUCATION:  Education details: HEP, Exercise Technique  Person educated: Patient Education method: Explanation, Demonstration, and Handouts Education comprehension: verbalized understanding and returned demonstration   HOME EXERCISE PROGRAM:  Access Code: [email or web address] URL: [email or web address] Date: 08/25/2024 Prepared by: Lonni Pall  Exercises - Supine Heel Slide  - 1 x daily - 3-4 x weekly - 2-3 sets - 10-15 reps - Supine Active Straight Leg Raise  - 1 x daily - 3-4 x weekly - 2-3 sets - 8-10 reps - 5 hold - Long Sitting Quad Set with Towel Roll Under Heel  - 1 x daily - 3-4 x weekly - 2-3 sets - 8-10 reps - Seated Long Arc Quad  - 1 x daily - 7 x weekly - 2-3 sets - 10-15 reps - 3s hold - Mini Squat  - 1 x daily - 3-4 x weekly - 2-3 sets - 8-10 reps - 5s hold - Forward Lunge with Back Leg Straight and Counter Support  - 1 x daily -  3-4 x weekly - 2-3 sets - 8-10 reps - Seated Hamstring Stretch  - 1 x daily - 7 x weekly - 3 sets - 15-30s hold - Supine Quadriceps Stretch with Strap on Table  - 1 x daily - 7 x weekly - 2-3 sets - 15-30s hold - Heel Raises with Counter Support  - 1 x daily - 7 x weekly - 2-3 sets - 10 reps - Forward Step Down with Counter Support at Side  - 1 x daily - 7 x weekly - 2-3 sets - 8-10 reps  Access Code:  [email or web address] URL: [email or web address] Date: 08/06/2024 Prepared by: Lonni Pall  Exercises - Supine Heel Slide  - 1 x daily - 3-4 x weekly - 2-3 sets - 10-15 reps - Supine Active Straight Leg Raise  - 1 x daily - 3-4 x weekly - 2-3 sets - 8-10 reps - 5 hold - Long Sitting Quad Set with Towel Roll Under Heel  - 1 x daily - 3-4 x weekly - 2-3 sets - 8-10 reps - Seated Long Arc Quad  - 1 x daily - 7 x weekly - 2-3 sets - 10-15 reps - 3s hold - Mini Squat  - 1 x daily - 3-4 x weekly - 2-3 sets - 8-10 reps - 5s hold - Forward Lunge with Back Leg Straight and Counter Support  - 1 x daily - 3-4 x weekly - 2-3 sets - 8-10 reps - Seated Hamstring Stretch  - 1 x daily - 7 x weekly - 3 sets - 15-30s hold - Supine Quadriceps Stretch with Strap on Table  - 1 x daily - 7 x weekly - 2-3 sets - 15-30s hold  ASSESSMENT:  CLINICAL IMPRESSION:   *** Continued PT POC in management of L knee pain. Continued focus on obstacle negotiation with emphasis on single leg stance on L. Tolerated session well with no increase in pain. PT to continue functional strengthening in order to improve curb/stair navigation. Based on today's performance, patient will continue to benefit from skilled PT in order to address current deficits and maximize return to PLOF.   OBJECTIVE IMPAIRMENTS: decreased activity tolerance, decreased ROM, decreased strength, obesity, and pain.   ACTIVITY LIMITATIONS: lifting, bending, sitting, standing, squatting, and stairs  PARTICIPATION LIMITATIONS: driving and occupation  PERSONAL FACTORS: Age, Past/current experiences, Time since onset of injury/illness/exacerbation, and 3+ comorbidities: Multiple Sclerosis, Chronic Kidney Disease, Obesity, etc. are also affecting patient's functional outcome.   REHAB POTENTIAL: Good  CLINICAL DECISION MAKING: Evolving/moderate complexity  EVALUATION COMPLEXITY: Moderate   GOALS: Goals reviewed with patient? Yes  SHORT TERM GOALS:  Target date: 11/03/2024  Pt will be independent with HEP to improve strength and decrease knee pain to improve pain-free function at home and work. Baseline: 07/07/2024: Initial HEP  Goal status: INITIAL   LONG TERM GOALS: Target date: 12/01/2024  Patient will be able to safely navigate a flight of 12 stairs, using proper foot placement and handrail support, without requiring assistance from PT in order to demonstrate significant improvement in LE strength and safety. Baseline: 07/07/2024: Pain with ascending/descending with LLE; 09/01/24: Performing reciprocally and with hand rail without external support. Goal status: MET  2.  Pt will decrease worst knee pain by at least 3 points on the NPRS in order to demonstrate clinically significant reduction in knee pain. Baseline: 07/07/2024: 8/10 NPS; 09/01/24: 09/01/24: 0/10 NPS for the last week Goal status: MET  3.  Pt will increase LEFS score by at least 9 points  in order demonstrate clinically significant reduction in knee pain/disability.       Baseline: 07/07/2024: 28/80 = 35%; 09/03/2024 36/80 = 45%;   Goal status: ON GOING   4.  Pt will increase strength of knee flexion/extension by at least 1/2 MMT grade and without pain in order to demonstrate improvement in strength and function  Baseline: 07/07/2024: L Knee Flex/Ext: 4/5, 4/5*; 09/01/24: 5/5 for knee extension and flexion and no pain Goal status: MET  5.  Pt will decrease 5TSTS by at least 3 seconds in order to demonstrate clinically significant improvement in LE strength Baseline: 07/07/2024: 16.15s; 9.36 seconds   Goal status: MET  6.  Pt will increase by at least 0.13 m/s in order to demonstrate clinically significant improvement in community ambulation.  Baseline: 07/07/2024: .71 m/s (Limited Community Ambulator); 09/01/24: .83 m/s self selected; 1.0 m/s fast speed Goal status: PARTIALLY MET  7. Pt will improve 30 second STS test to 15 reps or greater to meet age and sex matched  norms for muscular strength/endurance for ADL completion.      Baseline: 09/01/24: 14 reps     Goal status: Initial   PLAN: PT FREQUENCY: 1-2x/week  PT DURATION: 4 weeks  PLANNED INTERVENTIONS: Therapeutic exercises, Therapeutic activity, Neuromuscular re-education, Balance training, Gait training, Patient/Family education, Self Care, Joint mobilization, Joint manipulation, Vestibular training, Canalith repositioning, Orthotic/Fit training, DME instructions, Dry Needling, Electrical stimulation, Spinal manipulation, Spinal mobilization, Cryotherapy, Moist heat, Taping, Traction, Manual therapy, and Re-evaluation.  PLAN FOR NEXT SESSION: Functional strengthening (STS, Stairs, Curbs)    Maryanne Finder, PT, DPT Physical Therapist - [place]  [facility]  10/06/2024, 4:05 PM

## 2024-10-07 ENCOUNTER — Telehealth: Payer: Self-pay

## 2024-10-07 ENCOUNTER — Ambulatory Visit: Attending: Family Medicine

## 2024-10-07 DIAGNOSIS — M25562 Pain in left knee: Secondary | ICD-10-CM | POA: Insufficient documentation

## 2024-10-07 DIAGNOSIS — M25662 Stiffness of left knee, not elsewhere classified: Secondary | ICD-10-CM | POA: Insufficient documentation

## 2024-10-07 DIAGNOSIS — M6281 Muscle weakness (generalized): Secondary | ICD-10-CM | POA: Insufficient documentation

## 2024-10-07 NOTE — Telephone Encounter (Signed)
 Called patient after no show for her 1:45 appointment on 10/8. No answer. Left voicemail informing her of her next appointment to call if she needs to reschedule.   Maryanne Finder, PT, DPT Physical Therapist - [place]  [facility]

## 2024-10-09 MED ORDER — SOLIFENACIN SUCCINATE 5 MG PO TABS: 5.0000 mg | ORAL_TABLET | Freq: Every day | ORAL | 11 refills | Status: DC

## 2024-10-13 NOTE — Therapy (Incomplete)
 OUTPATIENT PHYSICAL THERAPY KNEE TREATMENT  Patient Name: Patricia Deleon MRN: [id number] DOB:[birth year], 63 y.o., female Today's Date: 10/13/2024  END OF SESSION:      Past Medical History:  Diagnosis Date   Agoraphobia with panic disorder    Anemia    Depression    Erosive gastritis    Fundic gland polyps of stomach, benign    Geographic tongue    GERD (gastroesophageal reflux disease)    History of esophagogastroduodenoscopy (EGD)    Hypercholesterolemia    Hypertension    Intervertebral disc disorder    thoracic,thoracolumber,lumbosacral   Multiple sclerosis    Obesity    Sleep apnea    Past Surgical History:  Procedure Laterality Date   COLONOSCOPY     COLONOSCOPY WITH PROPOFOL  N/A 07/07/2018   Procedure: COLONOSCOPY WITH PROPOFOL ;  Surgeon: Gaylyn Gladis PENNER, MD;  Location: [facility] ENDOSCOPY;  Service: Endoscopy;  Laterality: N/A;   ESOPHAGOGASTRODUODENOSCOPY (EGD) WITH PROPOFOL  N/A 01/31/2016   Procedure: ESOPHAGOGASTRODUODENOSCOPY (EGD) WITH PROPOFOL ;  Surgeon: Gladis PENNER Gaylyn, MD;  Location: [facility] ENDOSCOPY;  Service: Endoscopy;  Laterality: N/A;   IR FLUORO GUIDE CV LINE RIGHT  10/06/2018   IR REMOVAL TUN CV CATH W/O FL  10/15/2018   IR US  GUIDE VASC ACCESS RIGHT  10/06/2018   NISSEN FUNDOPLICATION     Patient Active Problem List   Diagnosis Date Noted   Chronic kidney disease, stage 2, mildly decreased GFR 02/12/2024   Annual physical exam 02/12/2024   Prediabetes 01/15/2023   Morbid obesity (HCC) 01/15/2023   Mixed hyperlipidemia 01/15/2023   Depression, recurrent 01/15/2023   Urinary tract infection, recurrent 07/11/2022   UTI symptoms 06/14/2022   Moderate episode of recurrent major depressive disorder (HCC) 05/28/2019   Excessive daytime sleepiness 08/25/2018   Multiple sclerosis 07/08/2018   Neck pain 07/08/2018   Transient vision disturbance of left eye 06/18/2018   Urge incontinence 06/18/2018   Numbness 06/18/2018   Acne rosacea 05/07/2016    Gastroesophageal reflux disease without esophagitis 09/28/2015   Displacement of lumbar intervertebral disc without myelopathy 09/28/2015   Insomnia 09/28/2015   Thoracic, 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   Adiposity 09/28/2015   Restless leg 09/28/2015   Severe obstructive sleep apnea 09/28/2015   Vitamin D  deficiency 09/28/2015   Hypothyroidism 07/11/2015   Hypertension 06/09/2015    PCP: Donzella Lauraine SAILOR, DO  REFERRING PROVIDER: Donzella Lauraine SAILOR, DO  REFERRING DIAG:  [phone] (ICD-10-CM) - Acute pain of left knee  G35 (ICD-10-CM) - Multiple sclerosis (HCC)    RATIONALE FOR EVALUATION AND TREATMENT: Rehabilitation  THERAPY DIAG: Muscle weakness (generalized)  Acute pain of left knee  Stiffness of left knee, not elsewhere classified  ONSET DATE: Chronic   FOLLOW-UP APPT SCHEDULED WITH REFERRING PROVIDER: Yes August 21, 2024    SUBJECTIVE:  SUBJECTIVE STATEMENT:    Patient reporting to OPPT with a c/c of L knee pain.   PERTINENT HISTORY:   Patricia Deleon is a 63 y.o. patient arriving to OPPT with L knee pain. She denies MOI or trauma to the knee. She reports that her knee will lock then pop with a relieving sensation. Additionally she reports that restricted ROM in her L knee compared to R knee. Patient reports aggravating factors: stairway navigation (both directions), sleeping at night. At night she describes as the pain worsening as it radiates from the L knee distal into the ankle/foot and intermittently it will travel to the L hip. Relieving factors include: daily supplements, gabapentin , ice modalities.   Red flags: chills/fever, night sweats, unexplained weight gain/loss, unrelenting pain  PAIN:   Pain Intensity: Present: 0/10,  Best: 0/10, Worst: 8/10 Pain location: L knee Pain quality: intermittent, aching, and throbbing  Radiating pain: Yes  Swelling: No  Popping, catching, locking: Yes  Numbness/Tingling: No Focal weakness or buckling: Yes 24-hour pain behavior: PM - Activity Dependent  History of prior back, hip, or knee injury, pain, surgery, or therapy: Yes  PRECAUTIONS: Fall, Low   WEIGHT BEARING RESTRICTIONS: No  FALLS: Has patient fallen in last 6 months? No  Living Environment Lives with: lives with their family Husband, Daughter, Son, Darden (3 yr old), 3 Dogs Lives in: House/apartment Stairs: Yes: Internal: 12-14 steps; on right going up Has following equipment at home: None  Prior level of function: Independent  Occupational demands: Works at [employer] (M-F, some Sat, 8-6)  Hobbies: None   Patient Goals: Patient would like to be able to perform stairway navigation without pain   OBJECTIVE:   Patient Surveys  LEFS: 28/80 = 35%    Cognition Cognition is within normal limits.     Gross Musculoskeletal Assessment Tremor: None Bulk: Normal Tone: Normal  GAIT: Distance walked: 3m Assistive device utilized: None Level of assistance: Complete Independence Comments: Reciprocal Gait, decreased velocity   Posture: Sitting: WNL Standing: WNL    AROM AROM (Normal range in degrees) AROM  Hip Right Left  Flexion (125) 90   Extension (15)    Abduction (40)    Adduction     Internal Rotation (45)    External Rotation (45)        Knee    Flexion (135) 110 110 *   Extension (0) 0 0      Ankle    Dorsiflexion (20)    Plantarflexion (50)    Inversion (35)    Eversion (15    (* = pain; Blank rows = not tested)  LE MMT: MMT (out of 5) Right Left  Hip flexion 4 4  Hip extension    Hip abduction (Seated) 4+ 4+  Hip adduction 5 5  Hip internal rotation    Hip external rotation    Knee flexion 4 4  Knee extension 4 4*  Ankle dorsiflexion 5 5  Ankle  plantarflexion 5 5  Ankle inversion    Ankle eversion    (* = pain; Blank rows = not tested)  Sensation Grossly intact to light touch bilateral LEs as determined by testing dermatomes L2-S2. Proprioception, and hot/cold testing deferred on this date.  Reflexes R/L Knee Jerk (L3/4): 2+/2+  Ankle Jerk (S1/2): 2+/2+   Muscle Length Hamstrings: R: Not examined L: Negative Quadriceps Arvid): R: Not examined L: Not examined   Palpation Location LEFT  RIGHT  Quadriceps 0   Medial Hamstrings 0   Lateral Hamstrings 0   Lateral Hamstring tendon 0   Medial Hamstring tendon 0   Quadriceps tendon 0   Patella    Patellar Tendon    Tibial Tuberosity    Medial joint line  2*    Lateral joint line 1   MCL 1   LCL    Adductor Tubercle    Pes Anserine tendon 1   Infrapatellar fat pad    Fibular head    Popliteal fossa    (Blank rows = not tested) Graded on 0-4 scale (0 = no pain, 1 = pain, 2 = pain with wincing/grimacing/flinching, 3 = pain with withdrawal, 4 = unwilling to allow palpation), (Blank rows = not tested)  Passive Accessory Motion Deferred  VASCULAR Dorsalis pedis and posterior tibial pulses are palpable   SPECIAL TESTS  Ligamentous Stability  MCL: Valgus Stress (30 degrees flexion): R: Not examined L: Negative  LCL: Varus Stress (30 degrees flexion): R: Not examined L: Negative  Meniscus Tests McMurray's Test:  Medial Meniscus (Tibial ER): R: Not examined L: Positive for Pain with ER  Lateral Meniscus (Tibial IR): R: Not examined L: Not examined   Patellar Tendinopathy Inferior pole palpation with anterior tilt: R: Not examined L: Negative  Functional Testing:  Squat: Deferred Sit to Stand: WNL, Reliant on UE in order to control descent Lateral Step Down: Deferred   : 13.99s 5TSTS: 16.15s   TODAY'S TREATMENT: DATE: 10/13/24    *** Subjective: Patient reports she is still feeling that full sensation in her L knee but no pain.  Plans to call MD today about her knee.   Therapeutic Exercise:  Matrix Cable Machine  Standing Hip flexion    R/L 2 x 10 - 25#   Standing Hip Abduction   R/L: 2 x 10 - 25#   Standing Hip Extension    R/L 2 x 10 - 25#  Therapeutic Activity:   NuStep L6-2 x 5 min x LE (Seat 10) for LE endurance and strength; PT manually adjusted resistance throughout bout.    Lateral step over 6 hurdles (5)   X 4 laps each direction - no UE support    Forward step over 6 hurdles (5)   X 3 laps leading with each LE - no UE support       Fwd step down 4 step - BUE support (less support compared to last session)   R/L 2 x 10   PATIENT EDUCATION:  Education details: HEP, Exercise Technique  Person educated: Patient Education method: Explanation, Demonstration, and Handouts Education comprehension: verbalized understanding and returned demonstration   HOME EXERCISE PROGRAM:  Access Code: [email or web address] URL: [email or web address] Date: 08/25/2024 Prepared by: Lonni Pall  Exercises - Supine Heel Slide  - 1 x daily - 3-4 x weekly - 2-3 sets - 10-15 reps - Supine Active Straight Leg Raise  - 1 x daily - 3-4 x weekly - 2-3 sets - 8-10 reps - 5 hold - Long Sitting Quad Set with Towel Roll Under Heel  - 1 x daily - 3-4 x weekly - 2-3 sets - 8-10 reps - Seated Long Arc Quad  - 1 x daily - 7 x weekly - 2-3 sets - 10-15 reps - 3s hold - Mini Squat  - 1 x daily - 3-4 x weekly - 2-3 sets - 8-10 reps - 5s hold - Forward Lunge with Back Leg Straight and Counter Support  - 1 x daily -  3-4 x weekly - 2-3 sets - 8-10 reps - Seated Hamstring Stretch  - 1 x daily - 7 x weekly - 3 sets - 15-30s hold - Supine Quadriceps Stretch with Strap on Table  - 1 x daily - 7 x weekly - 2-3 sets - 15-30s hold - Heel Raises with Counter Support  - 1 x daily - 7 x weekly - 2-3 sets - 10 reps - Forward Step Down with Counter Support at Side  - 1 x daily - 7 x weekly - 2-3 sets - 8-10 reps  Access Code:  [email or web address] URL: [email or web address] Date: 08/06/2024 Prepared by: Lonni Pall  Exercises - Supine Heel Slide  - 1 x daily - 3-4 x weekly - 2-3 sets - 10-15 reps - Supine Active Straight Leg Raise  - 1 x daily - 3-4 x weekly - 2-3 sets - 8-10 reps - 5 hold - Long Sitting Quad Set with Towel Roll Under Heel  - 1 x daily - 3-4 x weekly - 2-3 sets - 8-10 reps - Seated Long Arc Quad  - 1 x daily - 7 x weekly - 2-3 sets - 10-15 reps - 3s hold - Mini Squat  - 1 x daily - 3-4 x weekly - 2-3 sets - 8-10 reps - 5s hold - Forward Lunge with Back Leg Straight and Counter Support  - 1 x daily - 3-4 x weekly - 2-3 sets - 8-10 reps - Seated Hamstring Stretch  - 1 x daily - 7 x weekly - 3 sets - 15-30s hold - Supine Quadriceps Stretch with Strap on Table  - 1 x daily - 7 x weekly - 2-3 sets - 15-30s hold  ASSESSMENT:  CLINICAL IMPRESSION:   *** Continued PT POC in management of L knee pain. Continued focus on obstacle negotiation with emphasis on single leg stance on L. Tolerated session well with no increase in pain. PT to continue functional strengthening in order to improve curb/stair navigation. Based on today's performance, patient will continue to benefit from skilled PT in order to address current deficits and maximize return to PLOF.   OBJECTIVE IMPAIRMENTS: decreased activity tolerance, decreased ROM, decreased strength, obesity, and pain.   ACTIVITY LIMITATIONS: lifting, bending, sitting, standing, squatting, and stairs  PARTICIPATION LIMITATIONS: driving and occupation  PERSONAL FACTORS: Age, Past/current experiences, Time since onset of injury/illness/exacerbation, and 3+ comorbidities: Multiple Sclerosis, Chronic Kidney Disease, Obesity, etc. are also affecting patient's functional outcome.   REHAB POTENTIAL: Good  CLINICAL DECISION MAKING: Evolving/moderate complexity  EVALUATION COMPLEXITY: Moderate   GOALS: Goals reviewed with patient? Yes  SHORT TERM GOALS:  Target date: 11/10/2024  Pt will be independent with HEP to improve strength and decrease knee pain to improve pain-free function at home and work. Baseline: 07/07/2024: Initial HEP  Goal status: INITIAL   LONG TERM GOALS: Target date: 12/08/2024  Patient will be able to safely navigate a flight of 12 stairs, using proper foot placement and handrail support, without requiring assistance from PT in order to demonstrate significant improvement in LE strength and safety. Baseline: 07/07/2024: Pain with ascending/descending with LLE; 09/01/24: Performing reciprocally and with hand rail without external support. Goal status: MET  2.  Pt will decrease worst knee pain by at least 3 points on the NPRS in order to demonstrate clinically significant reduction in knee pain. Baseline: 07/07/2024: 8/10 NPS; 09/01/24: 09/01/24: 0/10 NPS for the last week Goal status: MET  3.  Pt will increase LEFS score by at least 9 points  in order demonstrate clinically significant reduction in knee pain/disability.       Baseline: 07/07/2024: 28/80 = 35%; 09/03/2024 36/80 = 45%;   Goal status: ON GOING   4.  Pt will increase strength of knee flexion/extension by at least 1/2 MMT grade and without pain in order to demonstrate improvement in strength and function  Baseline: 07/07/2024: L Knee Flex/Ext: 4/5, 4/5*; 09/01/24: 5/5 for knee extension and flexion and no pain Goal status: MET  5.  Pt will decrease 5TSTS by at least 3 seconds in order to demonstrate clinically significant improvement in LE strength Baseline: 07/07/2024: 16.15s; 9.36 seconds   Goal status: MET  6.  Pt will increase by at least 0.13 m/s in order to demonstrate clinically significant improvement in community ambulation.  Baseline: 07/07/2024: .71 m/s (Limited Community Ambulator); 09/01/24: .83 m/s self selected; 1.0 m/s fast speed Goal status: PARTIALLY MET  7. Pt will improve 30 second STS test to 15 reps or greater to meet age and sex matched  norms for muscular strength/endurance for ADL completion.      Baseline: 09/01/24: 14 reps     Goal status: Initial   PLAN: PT FREQUENCY: 1-2x/week  PT DURATION: 4 weeks  PLANNED INTERVENTIONS: Therapeutic exercises, Therapeutic activity, Neuromuscular re-education, Balance training, Gait training, Patient/Family education, Self Care, Joint mobilization, Joint manipulation, Vestibular training, Canalith repositioning, Orthotic/Fit training, DME instructions, Dry Needling, Electrical stimulation, Spinal manipulation, Spinal mobilization, Cryotherapy, Moist heat, Taping, Traction, Manual therapy, and Re-evaluation.  PLAN FOR NEXT SESSION: Functional strengthening (STS, Stairs, Curbs)    Maryanne Finder, PT, DPT Physical Therapist - [place]  [facility]  10/13/2024, 12:15 PM

## 2024-10-14 ENCOUNTER — Ambulatory Visit

## 2024-10-14 ENCOUNTER — Telehealth: Payer: Self-pay

## 2024-10-14 DIAGNOSIS — M6281 Muscle weakness (generalized): Secondary | ICD-10-CM

## 2024-10-14 DIAGNOSIS — M25662 Stiffness of left knee, not elsewhere classified: Secondary | ICD-10-CM

## 2024-10-14 DIAGNOSIS — M25562 Pain in left knee: Secondary | ICD-10-CM

## 2024-10-14 NOTE — Telephone Encounter (Signed)
 Called patient after no showing for her 8:15 appointment on 10/15. No answer. Left voicemail informing her that we will remove all appointments except Tuesday 10/21, however if she does not come to this appointment, it will be her responsibility to call and schedule appointments if she wishes to continue PT.   Maryanne Finder, PT, DPT Physical Therapist - [place]  [facility]

## 2024-10-19 NOTE — Therapy (Incomplete)
 OUTPATIENT PHYSICAL THERAPY KNEE TREATMENT  Patient Name: Patricia Deleon MRN: [id number] DOB:1961/10/06, 63 y.o., female Today's Date: 10/19/2024  END OF SESSION:      Past Medical History:  Diagnosis Date   Agoraphobia with panic disorder    Anemia    Depression    Erosive gastritis    Fundic gland polyps of stomach, benign    Geographic tongue    GERD (gastroesophageal reflux disease)    History of esophagogastroduodenoscopy (EGD)    Hypercholesterolemia    Hypertension    Intervertebral disc disorder    thoracic,thoracolumber,lumbosacral   Multiple sclerosis    Obesity    Sleep apnea    Past Surgical History:  Procedure Laterality Date   COLONOSCOPY     COLONOSCOPY WITH PROPOFOL  N/A 07/07/2018   Procedure: COLONOSCOPY WITH PROPOFOL ;  Surgeon: Gaylyn Gladis PENNER, MD;  Location: [facility] ENDOSCOPY;  Service: Endoscopy;  Laterality: N/A;   ESOPHAGOGASTRODUODENOSCOPY (EGD) WITH PROPOFOL  N/A 01/31/2016   Procedure: ESOPHAGOGASTRODUODENOSCOPY (EGD) WITH PROPOFOL ;  Surgeon: Gladis PENNER Gaylyn, MD;  Location[facility] ENDOSCOPY;  Service: Endoscopy;  Laterality: N/A;   IR FLUORO GUIDE CV LINE RIGHT  10/06/2018   IR REMOVAL TUN CV CATH W/O FL  10/15/2018   IR US  GUIDE VASC ACCESS RIGHT  10/06/2018   NISSEN FUNDOPLICATION     Patient Active Problem List   Diagnosis Date Noted   Chronic kidney disease, stage 2, mildly decreased GFR 02/12/2024   Annual physical exam 02/12/2024   Prediabetes 01/15/2023   Morbid obesity (HCC) 01/15/2023   Mixed hyperlipidemia 01/15/2023   Depression, recurrent 01/15/2023   Urinary tract infection, recurrent 07/11/2022   UTI symptoms 06/14/2022   Moderate episode of recurrent major depressive disorder (HCC) 05/28/2019   Excessive daytime sleepiness 08/25/2018   Multiple sclerosis 07/08/2018   Neck pain 07/08/2018   Transient vision disturbance of left eye 06/18/2018   Urge incontinence 06/18/2018   Numbness 06/18/2018   Acne rosacea 05/07/2016    Gastroesophageal reflux disease without esophagitis 09/28/2015   Displacement of lumbar intervertebral disc without myelopathy 09/28/2015   Insomnia 09/28/2015   Thoracic, 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   Adiposity 09/28/2015   Restless leg 09/28/2015   Severe obstructive sleep apnea 09/28/2015   Vitamin D  deficiency 09/28/2015   Hypothyroidism 07/11/2015   Hypertension 06/09/2015    PCP: Donzella Lauraine SAILOR, DO  REFERRING PROVIDER: Donzella Lauraine SAILOR, DO  REFERRING DIAG:  [phone] (ICD-10-CM) - Acute pain of left knee  G35 (ICD-10-CM) - Multiple sclerosis (HCC)    RATIONALE FOR EVALUATION AND TREATMENT: Rehabilitation  THERAPY DIAG: Muscle weakness (generalized)  Acute pain of left knee  Stiffness of left knee, not elsewhere classified  ONSET DATE: Chronic   FOLLOW-UP APPT SCHEDULED WITH REFERRING PROVIDER: Yes August 21, 2024    SUBJECTIVE:  SUBJECTIVE STATEMENT:    Patient reporting to OPPT with a c/c of L knee pain.   PERTINENT HISTORY:   [name] is a 63 y.o. patient arriving to OPPT with L knee pain. She denies MOI or trauma to the knee. She reports that her knee will lock then pop with a relieving sensation. Additionally she reports that restricted ROM in her L knee compared to R knee. Patient reports aggravating factors: stairway navigation (both directions), sleeping at night. At night she describes as the pain worsening as it radiates from the L knee distal into the ankle/foot and intermittently it will travel to the L hip. Relieving factors include: daily supplements, gabapentin , ice modalities.   Red flags: chills/fever, night sweats, unexplained weight gain/loss, unrelenting pain  PAIN:   Pain Intensity: Present: 0/10,  Best: 0/10, Worst: 8/10 Pain location: L knee Pain quality: intermittent, aching, and throbbing  Radiating pain: Yes  Swelling: No  Popping, catching, locking: Yes  Numbness/Tingling: No Focal weakness or buckling: Yes 24-hour pain behavior: PM - Activity Dependent  History of prior back, hip, or knee injury, pain, surgery, or therapy: Yes  PRECAUTIONS: Fall, Low   WEIGHT BEARING RESTRICTIONS: No  FALLS: Has patient fallen in last 6 months? No  Living Environment Lives with: lives with their family Husband, Daughter, Son, Darden (3 yr old), 3 Dogs Lives in: House/apartment Stairs: Yes: Internal: 12-14 steps; on right going up Has following equipment at home: None  Prior level of function: Independent  Occupational demands: Works at [employer] (M-F, some Sat, 8-6)  Hobbies: None   Patient Goals: Patient would like to be able to perform stairway navigation without pain   OBJECTIVE:   Patient Surveys  LEFS: 28/80 = 35%    Cognition Cognition is within normal limits.     Gross Musculoskeletal Assessment Tremor: None Bulk: Normal Tone: Normal  GAIT: Distance walked: 8m Assistive device utilized: None Level of assistance: Complete Independence Comments: Reciprocal Gait, decreased velocity   Posture: Sitting: WNL Standing: WNL    AROM AROM (Normal range in degrees) AROM  Hip Right Left  Flexion (125) 90   Extension (15)    Abduction (40)    Adduction     Internal Rotation (45)    External Rotation (45)        Knee    Flexion (135) 110 110 *   Extension (0) 0 0      Ankle    Dorsiflexion (20)    Plantarflexion (50)    Inversion (35)    Eversion (15    (* = pain; Blank rows = not tested)  LE MMT: MMT (out of 5) Right Left  Hip flexion 4 4  Hip extension    Hip abduction (Seated) 4+ 4+  Hip adduction 5 5  Hip internal rotation    Hip external rotation    Knee flexion 4 4  Knee extension 4 4*  Ankle dorsiflexion 5 5  Ankle  plantarflexion 5 5  Ankle inversion    Ankle eversion    (* = pain; Blank rows = not tested)  Sensation Grossly intact to light touch bilateral LEs as determined by testing dermatomes L2-S2. Proprioception, and hot/cold testing deferred on this date.  Reflexes R/L Knee Jerk (L3/4): 2+/2+  Ankle Jerk (S1/2): 2+/2+   Muscle Length Hamstrings: R: Not examined L: Negative Quadriceps Arvid): R: Not examined L: Not examined   Palpation Location LEFT  RIGHT  Quadriceps 0   Medial Hamstrings 0   Lateral Hamstrings 0   Lateral Hamstring tendon 0   Medial Hamstring tendon 0   Quadriceps tendon 0   Patella    Patellar Tendon    Tibial Tuberosity    Medial joint line  2*    Lateral joint line 1   MCL 1   LCL    Adductor Tubercle    Pes Anserine tendon 1   Infrapatellar fat pad    Fibular head    Popliteal fossa    (Blank rows = not tested) Graded on 0-4 scale (0 = no pain, 1 = pain, 2 = pain with wincing/grimacing/flinching, 3 = pain with withdrawal, 4 = unwilling to allow palpation), (Blank rows = not tested)  Passive Accessory Motion Deferred  VASCULAR Dorsalis pedis and posterior tibial pulses are palpable   SPECIAL TESTS  Ligamentous Stability  MCL: Valgus Stress (30 degrees flexion): R: Not examined L: Negative  LCL: Varus Stress (30 degrees flexion): R: Not examined L: Negative  Meniscus Tests McMurray's Test:  Medial Meniscus (Tibial ER): R: Not examined L: Positive for Pain with ER  Lateral Meniscus (Tibial IR): R: Not examined L: Not examined   Patellar Tendinopathy Inferior pole palpation with anterior tilt: R: Not examined L: Negative  Functional Testing:  Squat: Deferred Sit to Stand: WNL, Reliant on UE in order to control descent Lateral Step Down: Deferred   : 13.99s 5TSTS: 16.15s   TODAY'S TREATMENT: DATE: 10/19/24    *** Subjective: Patient reports she is still feeling that full sensation in her L knee but no pain.  Plans to call MD today about her knee.   Therapeutic Exercise:  Matrix Cable Machine  Standing Hip flexion    R/L 2 x 10 - 25#   Standing Hip Abduction   R/L: 2 x 10 - 25#   Standing Hip Extension    R/L 2 x 10 - 25#  Therapeutic Activity:   NuStep L6-2 x 5 min x LE (Seat 10) for LE endurance and strength; PT manually adjusted resistance throughout bout.    Lateral step over 6 hurdles (5)   X 4 laps each direction - no UE support    Forward step over 6 hurdles (5)   X 3 laps leading with each LE - no UE support       Fwd step down 4 step - BUE support (less support compared to last session)   R/L 2 x 10   PATIENT EDUCATION:  Education details: HEP, Exercise Technique  Person educated: Patient Education method: Explanation, Demonstration, and Handouts Education comprehension: verbalized understanding and returned demonstration   HOME EXERCISE PROGRAM:  Access Code: [email or web address] URL: [email or web address] Date: 08/25/2024 Prepared by: Lonni Pall  Exercises - Supine Heel Slide  - 1 x daily - 3-4 x weekly - 2-3 sets - 10-15 reps - Supine Active Straight Leg Raise  - 1 x daily - 3-4 x weekly - 2-3 sets - 8-10 reps - 5 hold - Long Sitting Quad Set with Towel Roll Under Heel  - 1 x daily - 3-4 x weekly - 2-3 sets - 8-10 reps - Seated Long Arc Quad  - 1 x daily - 7 x weekly - 2-3 sets - 10-15 reps - 3s hold - Mini Squat  - 1 x daily - 3-4 x weekly - 2-3 sets - 8-10 reps - 5s hold - Forward Lunge with Back Leg Straight and Counter Support  - 1 x daily -  3-4 x weekly - 2-3 sets - 8-10 reps - Seated Hamstring Stretch  - 1 x daily - 7 x weekly - 3 sets - 15-30s hold - Supine Quadriceps Stretch with Strap on Table  - 1 x daily - 7 x weekly - 2-3 sets - 15-30s hold - Heel Raises with Counter Support  - 1 x daily - 7 x weekly - 2-3 sets - 10 reps - Forward Step Down with Counter Support at Side  - 1 x daily - 7 x weekly - 2-3 sets - 8-10 reps  Access Code:  [email or web address] URL: [email or web address] Date: 08/06/2024 Prepared by: Lonni Pall  Exercises - Supine Heel Slide  - 1 x daily - 3-4 x weekly - 2-3 sets - 10-15 reps - Supine Active Straight Leg Raise  - 1 x daily - 3-4 x weekly - 2-3 sets - 8-10 reps - 5 hold - Long Sitting Quad Set with Towel Roll Under Heel  - 1 x daily - 3-4 x weekly - 2-3 sets - 8-10 reps - Seated Long Arc Quad  - 1 x daily - 7 x weekly - 2-3 sets - 10-15 reps - 3s hold - Mini Squat  - 1 x daily - 3-4 x weekly - 2-3 sets - 8-10 reps - 5s hold - Forward Lunge with Back Leg Straight and Counter Support  - 1 x daily - 3-4 x weekly - 2-3 sets - 8-10 reps - Seated Hamstring Stretch  - 1 x daily - 7 x weekly - 3 sets - 15-30s hold - Supine Quadriceps Stretch with Strap on Table  - 1 x daily - 7 x weekly - 2-3 sets - 15-30s hold  ASSESSMENT:  CLINICAL IMPRESSION:   *** Continued PT POC in management of L knee pain. Continued focus on obstacle negotiation with emphasis on single leg stance on L. Tolerated session well with no increase in pain. PT to continue functional strengthening in order to improve curb/stair navigation. Based on today's performance, patient will continue to benefit from skilled PT in order to address current deficits and maximize return to PLOF.   OBJECTIVE IMPAIRMENTS: decreased activity tolerance, decreased ROM, decreased strength, obesity, and pain.   ACTIVITY LIMITATIONS: lifting, bending, sitting, standing, squatting, and stairs  PARTICIPATION LIMITATIONS: driving and occupation  PERSONAL FACTORS: Age, Past/current experiences, Time since onset of injury/illness/exacerbation, and 3+ comorbidities: Multiple Sclerosis, Chronic Kidney Disease, Obesity, etc. are also affecting patient's functional outcome.   REHAB POTENTIAL: Good  CLINICAL DECISION MAKING: Evolving/moderate complexity  EVALUATION COMPLEXITY: Moderate   GOALS: Goals reviewed with patient? Yes  SHORT TERM GOALS:  Target date: 11/16/2024  Pt will be independent with HEP to improve strength and decrease knee pain to improve pain-free function at home and work. Baseline: 07/07/2024: Initial HEP  Goal status: INITIAL   LONG TERM GOALS: Target date: 12/14/2024  Patient will be able to safely navigate a flight of 12 stairs, using proper foot placement and handrail support, without requiring assistance from PT in order to demonstrate significant improvement in LE strength and safety. Baseline: 07/07/2024: Pain with ascending/descending with LLE; 09/01/24: Performing reciprocally and with hand rail without external support. Goal status: MET  2.  Pt will decrease worst knee pain by at least 3 points on the NPRS in order to demonstrate clinically significant reduction in knee pain. Baseline: 07/07/2024: 8/10 NPS; 09/01/24: 09/01/24: 0/10 NPS for the last week Goal status: MET  3.  Pt will increase LEFS score by at least 9 points  in order demonstrate clinically significant reduction in knee pain/disability.       Baseline: 07/07/2024: 28/80 = 35%; 09/03/2024 36/80 = 45%;   Goal status: ON GOING   4.  Pt will increase strength of knee flexion/extension by at least 1/2 MMT grade and without pain in order to demonstrate improvement in strength and function  Baseline: 07/07/2024: L Knee Flex/Ext: 4/5, 4/5*; 09/01/24: 5/5 for knee extension and flexion and no pain Goal status: MET  5.  Pt will decrease 5TSTS by at least 3 seconds in order to demonstrate clinically significant improvement in LE strength Baseline: 07/07/2024: 16.15s; 9.36 seconds   Goal status: MET  6.  Pt will increase by at least 0.13 m/s in order to demonstrate clinically significant improvement in community ambulation.  Baseline: 07/07/2024: .71 m/s (Limited Community Ambulator); 09/01/24: .83 m/s self selected; 1.0 m/s fast speed Goal status: PARTIALLY MET  7. Pt will improve 30 second STS test to 15 reps or greater to meet age and sex  matched norms for muscular strength/endurance for ADL completion.      Baseline: 09/01/24: 14 reps     Goal status: Initial   PLAN: PT FREQUENCY: 1-2x/week  PT DURATION: 4 weeks  PLANNED INTERVENTIONS: Therapeutic exercises, Therapeutic activity, Neuromuscular re-education, Balance training, Gait training, Patient/Family education, Self Care, Joint mobilization, Joint manipulation, Vestibular training, Canalith repositioning, Orthotic/Fit training, DME instructions, Dry Needling, Electrical stimulation, Spinal manipulation, Spinal mobilization, Cryotherapy, Moist heat, Taping, Traction, Manual therapy, and Re-evaluation.  PLAN FOR NEXT SESSION: Functional strengthening (STS, Stairs, Curbs)    Maryanne Finder, PT, DPT Physical Therapist - [place]  [facility]  10/19/2024, 12:22 PM

## 2024-10-20 ENCOUNTER — Ambulatory Visit

## 2024-10-20 DIAGNOSIS — M25562 Pain in left knee: Secondary | ICD-10-CM

## 2024-10-20 DIAGNOSIS — M6281 Muscle weakness (generalized): Secondary | ICD-10-CM

## 2024-10-20 DIAGNOSIS — M25662 Stiffness of left knee, not elsewhere classified: Secondary | ICD-10-CM

## 2024-10-22 ENCOUNTER — Encounter: Payer: Self-pay | Admitting: Urology

## 2024-10-27 ENCOUNTER — Encounter

## 2024-10-29 ENCOUNTER — Encounter

## 2024-11-03 ENCOUNTER — Encounter

## 2024-11-05 ENCOUNTER — Encounter

## 2024-11-08 ENCOUNTER — Encounter: Payer: Self-pay | Admitting: Urology

## 2024-11-10 ENCOUNTER — Encounter

## 2024-11-12 ENCOUNTER — Other Ambulatory Visit: Payer: Self-pay | Admitting: Family Medicine

## 2024-11-12 ENCOUNTER — Encounter

## 2024-11-12 DIAGNOSIS — Z713 Dietary counseling and surveillance: Secondary | ICD-10-CM

## 2024-11-13 NOTE — Telephone Encounter (Signed)
 Requested medication (s) are due for refill today: na   Requested medication (s) are on the active medication list: yes  Last refill:  08/21/24 #30 2 refills   Future visit scheduled: yes 11/19/24  Notes to clinic:  not delegated per protocol. Do you want to refill Rx?     Requested Prescriptions  Pending Prescriptions Disp Refills   naltrexone  (DEPADE) 50 MG tablet [Pharmacy Med Name: NALTREXONE  50 MG TABLET] 30 tablet 2    Sig: TAKE 1/2 TO 1 TABLET BY MOUTH DAILY     Not Delegated - Psychiatry: Drug Dependence Therapy - naltrexone  Failed - 11/13/2024  3:57 PM      Failed - This refill cannot be delegated      Passed - Completed PHQ-2 or PHQ-9 in the last 360 days      Passed - Valid encounter within last 6 months    Recent Outpatient Visits           2 months ago Severe obstructive sleep apnea   [facility] [place], Lauraine SAILOR, DO   6 months ago Primary hypertension   [facility] [place], Lauraine SAILOR, DO   9 months ago Annual physical exam   [facility] [place], Lauraine SAILOR, DO       Future Appointments             In 1 week MacDiarmid, Glendia, MD [facility]

## 2024-11-16 ENCOUNTER — Encounter

## 2024-11-17 NOTE — Telephone Encounter (Signed)
 LOV- 08/21/2024 NOV- 11/19/2024 LRF- 08/21/2024 Outpatient Medication Detail   Disp Refills Start End   naltrexone  (DEPADE) 50 MG tablet 30 tablet 2 08/21/2024 --   Sig - Route: Take 0.5-1 tablets (25-50 mg total) by mouth daily. - Oral   Sent to pharmacy as: naltrexone  (DEPADE) 50 MG tablet   E-Prescribing Status: Receipt confirmed by pharmacy (08/21/2024  8:36 AM EDT)

## 2024-11-18 ENCOUNTER — Encounter

## 2024-11-19 ENCOUNTER — Ambulatory Visit: Admitting: Family Medicine

## 2024-11-23 ENCOUNTER — Ambulatory Visit: Admitting: Urology

## 2024-11-23 VITALS — BP 139/81 | HR 91 | Ht 66.0 in | Wt 315.0 lb

## 2024-11-23 DIAGNOSIS — N3946 Mixed incontinence: Secondary | ICD-10-CM

## 2024-11-23 DIAGNOSIS — N390 Urinary tract infection, site not specified: Secondary | ICD-10-CM

## 2024-11-23 MED ORDER — SOLIFENACIN SUCCINATE 5 MG PO TABS
5.0000 mg | ORAL_TABLET | Freq: Every day | ORAL | 11 refills | Status: DC
Start: 2024-11-23 — End: 2024-11-25

## 2024-11-23 NOTE — Progress Notes (Signed)
003

## 2024-11-23 NOTE — Progress Notes (Signed)
 11/23/2024 11:13 AM   Beula Macario Feinstein 12-Nov-1961 [id number]  Referring provider: Donzella Lauraine SAILOR, DO [address] [place],  [place] [address]  Chief Complaint  Patient presents with   Follow-up   Urinary Incontinence    HPI: ST: Recurrent urinary tract infections with positive cultures and some resistance.  Recently provided Myrbetriq  for mixed incontinence.  Initially responded very well to Myrbetriq .  Has multiple sclerosis.  Last culture positive   Today I was consulted to assess the patient's urinary incontinence.  She has urgency incontinence.  She leaks with coughing sneezing sometimes bending lifting.  She has mild bedwetting.  She wears 2 or 3 pads a day sometimes soaked.  Flow was good and she feels empty, urgency incontinence with very little warning as the primary symptom   She says she has not had bladder infections for almost a year presenting is frequency, sometimes discomfort, sometimes without symptoms.   She has had multiple sclerosis for 6 years.  She has not had a hysterectomy.   On pelvic examination patient had mild grade 2 hypermobility the bladder neck and negative cough test.  No significant prolapse     Patient has mixed incontinence and multiple sclerosis. She has milder bedwetting. She will return for urodynamics and cystoscopy. She is a partial responder to Myrbetriq  and has not tried other medication. Call if culture positive. May or may not need prophylaxis in the future    Today I last saw the patient in October 2024.  Urine culture negative Continence status unchanged.  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  She says she has not had any infections   On requestioning patient says her main symptom is definitely urge incontinence and also foot and the floor syndrome in the middle of the night.  She only leaks a small amount with coughing sneezing.  She does not see out of her  right eye due to her multiple sclerosis   Grade 1 hypermobility the bladder neck no stress incontinence after cystoscopy with light cough   Cystoscopy: Patient underwent flexible cystoscopy.  Bladder close and trigone were normal.  Urine was little bit cloudy.  Urine aspirated and sent for culture.  A few white flecks   During urodynamics patient did not void was catheterized for a few milliliters.  Maxim bladder capacity is 340 mL.  Bladder was stable.  She had increased bladder sensation.  Cough leak point pressure 300 mL was 25 cm of water with mild to moderate leakage.  Cough leak point pressure 340 mL was 53 cm of water with mild to moderate leakage.  Valsalva leak point pressure was 49 cm water with mild leakage at the same volume.  During voiding she voided 99 mL with maximal flow of 40 mL/s.  In my opinion she voided by straining and did not generate a detrusor contraction.  It was felt that she may be generating a bladder contraction but I think it was subtraction artifact and she did not truly generate a contraction.  Residual 240 mL.  Bladder neck descended less than a centimeter.  EMG activity normal.  No Christ-mas tree shape bladder.   Patient has mixed incontinence. I was a bit surprised of the urodynamic findings. 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 In my opinion she would be at significant risk of retention if she had a sling. If her stress  incontinence is treated I would offer a bulking agent. In spite of the leak point pressures I will treat her with the OAB pathway. Call if culture positive. Reassess on Gemtesa  samples and prescription. 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  On repeat questioning her primary symptom is urge incontinence but she still leaks with coughing sneezing bending lifting.  She  can soak 2 or 3 pads a day.  She voids every 2 hours both day and night.  Flow was good.  Symptomatically she does not think she gets a lot of bladder infections and thinks she only had 1 in the last many months.  On repeat pelvic examination patient had mild hypermobility the bladder neck and negative cough test with moderate prolapse.    PMH: Past Medical History:  Diagnosis Date   Agoraphobia with panic disorder    Anemia    Depression    Erosive gastritis    Fundic gland polyps of stomach, benign    Geographic tongue    GERD (gastroesophageal reflux disease)    History of esophagogastroduodenoscopy (EGD)    Hypercholesterolemia    Hypertension    Intervertebral disc disorder    thoracic,thoracolumber,lumbosacral   Multiple sclerosis    Obesity    Sleep apnea     Surgical History: Past Surgical History:  Procedure Laterality Date   COLONOSCOPY     COLONOSCOPY WITH PROPOFOL  N/A 07/07/2018   Procedure: COLONOSCOPY WITH PROPOFOL ;  Surgeon: Gaylyn Gladis PENNER, MD;  Location: [facility] ENDOSCOPY;  Service: Endoscopy;  Laterality: N/A;   ESOPHAGOGASTRODUODENOSCOPY (EGD) WITH PROPOFOL  N/A 01/31/2016   Procedure: ESOPHAGOGASTRODUODENOSCOPY (EGD) WITH PROPOFOL ;  Surgeon: Gladis PENNER Gaylyn, MD;  Location[facility] END[facility]COPY;  Service: Endoscopy;  Laterality: N/A;   IR FLUORO GUIDE CV LINE RIGHT  10/06/2018   IR REMOVAL TUN CV CATH W/O FL  10/15/2018   IR US  GUIDE VASC ACCESS RIGHT  10/06/2018   NISSEN FUNDOPLICATION      Home Medications:  Allergies as of 11/23/2024       Reactions   Shellfish Allergy Hives        Medication List        Accurate as of November 23, 2024 11:13 AM. If you have any questions, ask your nurse or doctor.          ARIPiprazole  10 MG tablet Commonly known as: ABILIFY  Take 1 tablet (10 mg total) by mouth daily.   Armodafinil  250 MG tablet Take 1 tablet (250 mg total) by mouth daily.   aspirin 81 MG chewable tablet Chew 81 mg by mouth daily.    Fish Oil 500 MG Caps Take by mouth.   gabapentin  100 MG capsule Commonly known as: NEURONTIN  Take 1 capsule (100 mg total) by mouth 2 (two) times daily.   Glatiramer Acetate 40 MG/ML Sosy Inject into the skin.   HM Slow Release Iron 45 MG Tbcr Generic drug: Ferrous Sulfate Dried Take by mouth.   ketoconazole  2 % shampoo Commonly known as: NIZORAL  APPLY 1 APPLICATION TOPICALLY 2 (TWO) TIMES A WEEK.   ketoconazole  2 % cream Commonly known as: NIZORAL  Apply 1 application topically daily.   levothyroxine  125 MCG tablet Commonly known as: SYNTHROID  Take 1 tablet (125 mcg total) by mouth daily.   losartan -hydrochlorothiazide 100-25 MG tablet Commonly known as: HYZAAR Take 1 tablet by mouth daily.   metFORMIN  750 MG 24 hr tablet  Commonly known as: GLUCOPHAGE -XR Take 1 tablet (750 mg total) by mouth in the morning and at bedtime.   mirabegron  ER 25 MG Tb24 tablet Commonly known as: MYRBETRIQ  Take 25 mg by mouth daily.   naltrexone  50 MG tablet Commonly known as: DEPADE TAKE 1/2 TO 1 TABLET BY MOUTH DAILY   pantoprazole  40 MG tablet Commonly known as: PROTONIX  Take 1 tablet (40 mg total) by mouth daily.   polyethylene glycol powder 17 GM/SCOOP powder Commonly known as: GLYCOLAX/MIRALAX Take 17 g by mouth at bedtime.   rosuvastatin  20 MG tablet Commonly known as: CRESTOR  Take 1 tablet (20 mg total) by mouth daily.   solifenacin  5 MG tablet Commonly known as: VESICARE  Take 1 tablet (5 mg total) by mouth daily.   tirzepatide  2.5 MG/0.5ML injection vial Commonly known as: ZEPBOUND  Inject 2.5 mg into the skin once a week.   trazodone  300 MG tablet Commonly known as: DESYREL  Take 1 tablet (300 mg total) by mouth at bedtime.   venlafaxine  XR 150 MG 24 hr capsule Commonly known as: EFFEXOR -XR TAKE 2 CAPSULES (300 MG TOTAL) BY MOUTH DAILY WITH BREAKFAST   Vitamin D  (Ergocalciferol ) 1.25 MG (50000 UNIT) Caps capsule Commonly known as: DRISDOL  Take 1 capsule  (50,000 Units total) by mouth every 7 (seven) days. AFTER finishing this bottle, start OTC vitamin D  2000 units daily   Vitamin D3 50 MCG (2000 UT) capsule Take 4,000 Units by mouth daily.        Allergies:  Allergies  Allergen Reactions   Shellfish Allergy Hives    Family History: Family History  Problem Relation Age of Onset   Lung cancer Mother    Hypertension Mother    Alcohol abuse Mother    Anxiety disorder Mother    Hypertension Father    Arthritis Father    Alcohol abuse Father    Esophageal cancer Father    Alcohol abuse Brother    Hypertension Brother    Autism Son    Coronary artery disease Maternal Grandfather    Heart attack Maternal Grandfather    CVA Paternal Grandmother    Skin cancer Paternal Grandmother    Cancer Maternal Grandmother     Social History:  reports that she has never smoked. She has never used smokeless tobacco. She reports that she does not drink alcohol and does not use drugs.  ROS:                                        Physical Exam: BP 139/81 (BP Location: Left Arm, Patient Position: Sitting, Cuff Size: Large)   Pulse 91   Ht 5' 6 (1.676 m)   Wt (!) 142.9 kg   LMP 08/06/2018   SpO2 96%   BMI 50.84 kg/m   Constitutional:  Alert and oriented, No acute distress. HEENT: [place] AT, moist mucus membranes.  Trachea midline, no masses.   Laboratory Data: Lab Results  Component Value Date   WBC 3.4 01/15/2023   HGB 13.6 01/15/2023   HCT 41.1 01/15/2023   MCV 87 01/15/2023   PLT 188 01/15/2023    Lab Results  Component Value Date   CREATININE 1.02 (H) 02/12/2024    No results found for: PSA  No results found for: TESTOSTERONE  Lab Results  Component Value Date   HGBA1C 6.1 (H) 02/12/2024    Urinalysis    Component Value Date/Time  APPEARANCEUR Clear 09/07/2024 1418   GLUCOSEU Negative 09/07/2024 1418   BILIRUBINUR Negative 09/07/2024 1418   KETONESUR trace (5) (A) 06/03/2022 1147    PROTEINUR Negative 09/07/2024 1418   UROBILINOGEN 1.0 07/11/2022 1055   NITRITE Negative 09/07/2024 1418   LEUKOCYTESUR Trace (A) 09/07/2024 1418    Pertinent Imaging:   Assessment & Plan: Patient has mixed incontinence.  Symptomatically she primarily has urge incontinence but she did have a lower leak point pressures.  She has multiple sclerosis and in my opinion she does not have a sling.  A bulking agent could also have some risk of retention and/or persistent OAB symptoms.  She has tried to beta 3 agonist.  I think the best pathway is the overactive bladder pathway.  She understands the pathophysiology.  Reassess in 5 weeks on Vesicare  5 mg 3 x 11.  Discussed PTNS and InterStim then.  A bulking agent is a distant option.  Based on body habitus and neurogenic bladder do not think Botox is in her best interest  1. 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  Glendia DELENA Elizabeth, MD  [facility] [address] [place], [place] [address] [phone]

## 2024-11-24 ENCOUNTER — Encounter: Payer: Self-pay | Admitting: Urology

## 2024-11-25 NOTE — Addendum Note (Signed)
 Addended byBETHA CORIE PLATER on: 11/25/2024 02:22 PM   Modules accepted: Orders

## 2024-11-30 NOTE — Telephone Encounter (Signed)
 Pt states she has tried the new medicine for a month (Solifenacin ). This did not work at all. she's having more frequent accidents. She has tried oxybutynin , gemtesa  and myrbetriq  in the past. Please advise.

## 2024-12-07 ENCOUNTER — Ambulatory Visit: Admitting: Urology

## 2024-12-25 ENCOUNTER — Ambulatory Visit (INDEPENDENT_AMBULATORY_CARE_PROVIDER_SITE_OTHER): Admitting: Family Medicine

## 2024-12-25 ENCOUNTER — Encounter: Payer: Self-pay | Admitting: Family Medicine

## 2024-12-25 VITALS — BP 136/66 | HR 80 | Temp 97.6°F | Ht 66.0 in | Wt 310.9 lb

## 2024-12-25 DIAGNOSIS — N182 Chronic kidney disease, stage 2 (mild): Secondary | ICD-10-CM | POA: Diagnosis not present

## 2024-12-25 DIAGNOSIS — K219 Gastro-esophageal reflux disease without esophagitis: Secondary | ICD-10-CM | POA: Diagnosis not present

## 2024-12-25 DIAGNOSIS — E039 Hypothyroidism, unspecified: Secondary | ICD-10-CM

## 2024-12-25 DIAGNOSIS — I10 Essential (primary) hypertension: Secondary | ICD-10-CM

## 2024-12-25 DIAGNOSIS — Z7689 Persons encountering health services in other specified circumstances: Secondary | ICD-10-CM | POA: Diagnosis not present

## 2024-12-25 DIAGNOSIS — G4733 Obstructive sleep apnea (adult) (pediatric): Secondary | ICD-10-CM | POA: Diagnosis not present

## 2024-12-25 DIAGNOSIS — E559 Vitamin D deficiency, unspecified: Secondary | ICD-10-CM | POA: Diagnosis not present

## 2024-12-25 DIAGNOSIS — E782 Mixed hyperlipidemia: Secondary | ICD-10-CM

## 2024-12-25 DIAGNOSIS — R7303 Prediabetes: Secondary | ICD-10-CM

## 2024-12-25 NOTE — Progress Notes (Signed)
 "     Established patient visit   Patient: Patricia Deleon   DOB: [birth year]   63 y.o. Female  MRN: [id number] Visit Date: 12/25/2024  Today's healthcare provider: LAURAINE LOISE BUOY, DO   Chief Complaint  Patient presents with   Follow-up    Patient is here today for a weight check.  States that she is tolerating the Naltrexone  just fine.   Subjective    HPI Ruvi Tija Biss is a 63 year old female with history of multiple sclerosis who presents for follow-up regarding weight management and medication review.  She has experienced a weight loss of 12 pounds since her last visit in August. She is currently taking naltrexone  and is doing well with it.  She has not completed her sleep study due to confusion about the location, needing to send it to the facility in [place] where her husband works. She mentions difficulty in communication with the facility regarding the correct location for the study.  She is taking venlafaxine  and is doing well with it. She plans to see her psychiatrist next month for a follow-up.  She is also taking armodafinil  for sleep and fatigue issues and is doing well with it, although she occasionally experiences difficulty obtaining refills due to pharmacy errors. She notes that she was given a thirty-day supply but has run out of medication.  She reports experiencing lightheadedness and dizziness which is unchanged from her baseline and associated with her multiple sclerosis.  She has completed her course of vitamin D  supplementation.      Medications: Show/hide medication list[1]  Review of Systems  Respiratory:  Negative for chest tightness and shortness of breath.   Cardiovascular:  Negative for chest pain.  Gastrointestinal:  Negative for abdominal pain, nausea and vomiting.  Neurological:  Negative for headaches.        Objective    BP 136/66 (BP Location: Left Arm, Patient Position: Sitting, Cuff Size: Large)   Pulse 80   Temp  97.6 F (36.4 C) (Oral)   Ht 5' 6 (1.676 m)   Wt (!) 310 lb 14.4 oz (141 kg)   LMP 08/06/2018   SpO2 97%   BMI 50.18 kg/m     Physical Exam Constitutional:      Appearance: Normal appearance.  HENT:     Head: Normocephalic and atraumatic.  Eyes:     General: No scleral icterus.    Extraocular Movements: Extraocular movements intact.     Conjunctiva/sclera: Conjunctivae normal.  Cardiovascular:     Rate and Rhythm: Normal rate and regular rhythm.     Pulses: Normal pulses.     Heart sounds: Normal heart sounds.  Pulmonary:     Effort: Pulmonary effort is normal. No respiratory distress.     Breath sounds: Normal breath sounds.  Musculoskeletal:     Right lower leg: No edema.     Left lower leg: No edema.  Skin:    General: Skin is warm and dry.  Neurological:     Mental Status: She is alert and oriented to person, place, and time. Mental status is at baseline.  Psychiatric:        Mood and Affect: Mood normal.        Behavior: Behavior normal.      No results found for any visits on 12/25/24.  Assessment & Plan    Primary hypertension -     Microalbumin / creatinine urine ratio -     Comprehensive metabolic panel with  GFR  Hypothyroidism, unspecified type -     TSH Rfx on Abnormal to Free T4  Morbid obesity (HCC)  Encounter for weight management  Mixed hyperlipidemia -     Lipid panel  Severe obstructive sleep apnea -     Ambulatory referral to Sleep Studies  Vitamin D  deficiency -     VITAMIN D  25 Hydroxy (Vit-D Deficiency, Fractures)  Chronic kidney disease, stage 2, mildly decreased GFR -     Microalbumin / creatinine urine ratio  Gastroesophageal reflux disease without esophagitis  Prediabetes -     Hemoglobin A1c       Primary hypertension Chronic, borderline today.  Patient working toward weight loss, which will result in improved blood pressure.  Will continue to monitor  Hypothyroidism, unspecified type Chronic, stable.  Will  recheck TSH today and adjust medication if indicated.  Morbid obesity; encounter for weight management Weight loss of 12 pounds since August indicates progress.  Continue naltrexone  and lifestyle modifications as tolerated.  Mixed hyperlipidemia Chronic, stable on rosuvastatin  20 mg daily.  Recheck lipid panel today.  Severe obstructive sleep apnea New sleep study necessary for replacement of CPAP.  Will reorder referral to sleep studies to [facility], with patient preferring to have her sleep study performed at the [facility] location.  Consider initiation of Zepbound  for sleep apnea based on results, given patient's concomitant BMI >40.  Vitamin D  deficiency Completed current supplementation. - Ordered recheck of vitamin D  levels.  Chronic kidney disease, stage 2, mildly decreased GFR Noted.  Check CMP and uACR today.  Continue to optimize risk factors.  Gastroesophageal reflux disease without esophagitis Chronic, stable on pantoprazole .  No changes today.  General health maintenance Discussed COVID booster and pneumonia vaccine, no decision made. - Consider COVID booster and pneumonia vaccine.    Return in about 3 months (around 03/25/2025) for CPE, Chronic f/u, and in 6-9 months for Chronic f/u w/Dr. Franchot.      I discussed the assessment and treatment plan with the patient  The patient was provided an opportunity to ask questions and all were answered. The patient agreed with the plan and demonstrated an understanding of the instructions.   The patient was advised to call back or seek an in-person evaluation if the symptoms worsen or if the condition fails to improve as anticipated.    LAURAINE LOISE BUOY, DO  [facility] [phone] (phone) [phone] (fax)  [facility]    [1]  Outpatient Medications Prior to Visit  Medication Sig   ARIPiprazole  (ABILIFY ) 10 MG tablet Take 1 tablet (10 mg total) by mouth daily.   Armodafinil  250  MG tablet Take 1 tablet (250 mg total) by mouth daily.   aspirin 81 MG chewable tablet Chew 81 mg by mouth daily.   Ferrous Sulfate Dried (HM SLOW RELEASE IRON) 45 MG TBCR Take by mouth.   gabapentin  (NEURONTIN ) 100 MG capsule Take 1 capsule (100 mg total) by mouth 2 (two) times daily.   Glatiramer Acetate 40 MG/ML SOSY Inject into the skin.   ketoconazole  (NIZORAL ) 2 % cream Apply 1 application topically daily.   ketoconazole  (NIZORAL ) 2 % shampoo APPLY 1 APPLICATION TOPICALLY 2 (TWO) TIMES A WEEK.   levothyroxine  (SYNTHROID ) 125 MCG tablet Take 1 tablet (125 mcg total) by mouth daily.   losartan -hydrochlorothiazide (HYZAAR) 100-25 MG tablet Take 1 tablet by mouth daily.   metFORMIN  (GLUCOPHAGE -XR) 750 MG 24 hr tablet Take 1 tablet (750 mg total) by mouth in the  morning and at bedtime.   naltrexone  (DEPADE) 50 MG tablet TAKE 1/2 TO 1 TABLET BY MOUTH DAILY   Omega-3 Fatty Acids (FISH OIL) 500 MG CAPS Take by mouth.   pantoprazole  (PROTONIX ) 40 MG tablet Take 1 tablet (40 mg total) by mouth daily.   polyethylene glycol powder (GLYCOLAX/MIRALAX) 17 GM/SCOOP powder Take 17 g by mouth at bedtime.    rosuvastatin  (CRESTOR ) 20 MG tablet Take 1 tablet (20 mg total) by mouth daily.   traZODone  (DESYREL ) 300 MG tablet Take 1 tablet (300 mg total) by mouth at bedtime.   venlafaxine  XR (EFFEXOR -XR) 150 MG 24 hr capsule TAKE 2 CAPSULES (300 MG TOTAL) BY MOUTH DAILY WITH BREAKFAST   [DISCONTINUED] Cholecalciferol (VITAMIN D3) 2000 UNITS capsule Take 4,000 Units by mouth daily.    [DISCONTINUED] mirabegron  ER (MYRBETRIQ ) 25 MG TB24 tablet Take 25 mg by mouth daily.   [DISCONTINUED] tirzepatide  (ZEPBOUND ) 2.5 MG/0.5ML injection vial Inject 2.5 mg into the skin once a week.   [DISCONTINUED] Vitamin D , Ergocalciferol , (DRISDOL ) 1.25 MG (50000 UNIT) CAPS capsule Take 1 capsule (50,000 Units total) by mouth every 7 (seven) days. AFTER finishing this bottle, start OTC vitamin D  2000 units daily   No  facility-administered medications prior to visit.   "

## 2025-01-11 ENCOUNTER — Ambulatory Visit: Admitting: Urology

## 2025-01-18 ENCOUNTER — Telehealth: Payer: Self-pay | Admitting: Family Medicine

## 2025-01-18 ENCOUNTER — Other Ambulatory Visit: Payer: Self-pay

## 2025-01-18 DIAGNOSIS — M5431 Sciatica, right side: Secondary | ICD-10-CM

## 2025-01-18 MED ORDER — LOSARTAN POTASSIUM-HCTZ 100-25 MG PO TABS: 1.0000 | ORAL_TABLET | Freq: Every day | ORAL | 3 refills | Status: AC

## 2025-01-18 MED ORDER — GABAPENTIN 100 MG PO CAPS: 100.0000 mg | ORAL_CAPSULE | Freq: Two times a day (BID) | ORAL | 3 refills | Status: AC

## 2025-01-18 NOTE — Telephone Encounter (Addendum)
 [facility] faxed refill request for the following medications:  gabapentin  (NEURONTIN ) 100 MG capsule   losartan -hydrochlorothiazide (HYZAAR) 100-25 MG tablet    Please advise.

## 2025-01-31 ENCOUNTER — Other Ambulatory Visit: Payer: Self-pay | Admitting: Family Medicine

## 2025-01-31 DIAGNOSIS — E782 Mixed hyperlipidemia: Secondary | ICD-10-CM

## 2025-02-08 ENCOUNTER — Ambulatory Visit: Admitting: Urology

## 2025-03-26 ENCOUNTER — Ambulatory Visit: Admitting: Family Medicine

## 2025-03-26 ENCOUNTER — Encounter: Admitting: Family Medicine

## 2025-06-25 ENCOUNTER — Ambulatory Visit
